# Patient Record
Sex: Female | Born: 1950 | Race: Black or African American | Hispanic: No | Marital: Married | Smoking: Former smoker
Health system: Southern US, Community
[De-identification: ages and names within clinical notes are randomized; demographics above are authoritative.]

## PROBLEM LIST (undated history)

## (undated) DIAGNOSIS — N189 Chronic kidney disease, unspecified: Secondary | ICD-10-CM

## (undated) DIAGNOSIS — K56609 Unspecified intestinal obstruction, unspecified as to partial versus complete obstruction: Secondary | ICD-10-CM

## (undated) DIAGNOSIS — F419 Anxiety disorder, unspecified: Secondary | ICD-10-CM

## (undated) DIAGNOSIS — E669 Obesity, unspecified: Secondary | ICD-10-CM

## (undated) DIAGNOSIS — L0291 Cutaneous abscess, unspecified: Secondary | ICD-10-CM

## (undated) DIAGNOSIS — F329 Major depressive disorder, single episode, unspecified: Secondary | ICD-10-CM

## (undated) DIAGNOSIS — N289 Disorder of kidney and ureter, unspecified: Secondary | ICD-10-CM

## (undated) DIAGNOSIS — K219 Gastro-esophageal reflux disease without esophagitis: Secondary | ICD-10-CM

## (undated) DIAGNOSIS — I509 Heart failure, unspecified: Secondary | ICD-10-CM

## (undated) DIAGNOSIS — I10 Essential (primary) hypertension: Secondary | ICD-10-CM

## (undated) DIAGNOSIS — K439 Ventral hernia without obstruction or gangrene: Secondary | ICD-10-CM

## (undated) DIAGNOSIS — F32A Depression, unspecified: Secondary | ICD-10-CM

## (undated) DIAGNOSIS — E785 Hyperlipidemia, unspecified: Secondary | ICD-10-CM

## (undated) HISTORY — DX: Essential (primary) hypertension: I10

## (undated) HISTORY — DX: Unspecified intestinal obstruction, unspecified as to partial versus complete obstruction: K56.609

## (undated) HISTORY — DX: Chronic kidney disease, unspecified: N18.9

## (undated) HISTORY — PX: PANNICULECTOMY: SUR1001

## (undated) HISTORY — PX: EXPLORATORY LAPAROTOMY: SUR591

## (undated) HISTORY — PX: BREAST SURGERY: SHX581

## (undated) HISTORY — DX: Heart failure, unspecified: I50.9

## (undated) HISTORY — DX: Ventral hernia without obstruction or gangrene: K43.9

## (undated) HISTORY — DX: Obesity, unspecified: E66.9

## (undated) HISTORY — DX: Disorder of kidney and ureter, unspecified: N28.9

## (undated) HISTORY — PX: INCISE AND DRAIN ABCESS: PRO64

## (undated) HISTORY — DX: Hyperlipidemia, unspecified: E78.5

## (undated) HISTORY — DX: Cutaneous abscess, unspecified: L02.91

## (undated) HISTORY — DX: Gastro-esophageal reflux disease without esophagitis: K21.9

## (undated) HISTORY — PX: OOPHORECTOMY: SHX86

## (undated) HISTORY — PX: HERNIA REPAIR: SHX51

---

## 1997-12-01 ENCOUNTER — Other Ambulatory Visit: Admission: RE | Admit: 1997-12-01 | Discharge: 1997-12-01 | Payer: Self-pay | Admitting: Obstetrics and Gynecology

## 1998-02-27 ENCOUNTER — Emergency Department (HOSPITAL_COMMUNITY): Admission: EM | Admit: 1998-02-27 | Discharge: 1998-02-27 | Payer: Self-pay | Admitting: Emergency Medicine

## 1998-04-23 ENCOUNTER — Emergency Department (HOSPITAL_COMMUNITY): Admission: EM | Admit: 1998-04-23 | Discharge: 1998-04-23 | Payer: Self-pay | Admitting: Emergency Medicine

## 1998-04-24 ENCOUNTER — Encounter: Payer: Self-pay | Admitting: Emergency Medicine

## 1998-08-07 ENCOUNTER — Other Ambulatory Visit: Admission: RE | Admit: 1998-08-07 | Discharge: 1998-08-07 | Payer: Self-pay | Admitting: Obstetrics and Gynecology

## 1998-10-05 ENCOUNTER — Encounter: Payer: Self-pay | Admitting: Emergency Medicine

## 1998-10-05 ENCOUNTER — Emergency Department (HOSPITAL_COMMUNITY): Admission: EM | Admit: 1998-10-05 | Discharge: 1998-10-05 | Payer: Self-pay | Admitting: Emergency Medicine

## 1998-11-16 ENCOUNTER — Emergency Department (HOSPITAL_COMMUNITY): Admission: EM | Admit: 1998-11-16 | Discharge: 1998-11-16 | Payer: Self-pay | Admitting: *Deleted

## 1998-11-17 ENCOUNTER — Encounter: Payer: Self-pay | Admitting: Emergency Medicine

## 2000-07-02 ENCOUNTER — Emergency Department (HOSPITAL_COMMUNITY): Admission: EM | Admit: 2000-07-02 | Discharge: 2000-07-02 | Payer: Self-pay | Admitting: Emergency Medicine

## 2000-11-03 ENCOUNTER — Other Ambulatory Visit: Admission: RE | Admit: 2000-11-03 | Discharge: 2000-11-03 | Payer: Self-pay | Admitting: Obstetrics and Gynecology

## 2001-05-28 ENCOUNTER — Emergency Department (HOSPITAL_COMMUNITY): Admission: EM | Admit: 2001-05-28 | Discharge: 2001-05-28 | Payer: Self-pay

## 2001-06-22 ENCOUNTER — Emergency Department (HOSPITAL_COMMUNITY): Admission: EM | Admit: 2001-06-22 | Discharge: 2001-06-22 | Payer: Self-pay

## 2001-06-30 ENCOUNTER — Encounter: Admission: RE | Admit: 2001-06-30 | Discharge: 2001-07-02 | Payer: Self-pay | Admitting: Orthopedic Surgery

## 2001-07-06 ENCOUNTER — Encounter: Payer: Self-pay | Admitting: Orthopedic Surgery

## 2001-07-08 ENCOUNTER — Ambulatory Visit (HOSPITAL_COMMUNITY): Admission: RE | Admit: 2001-07-08 | Discharge: 2001-07-08 | Payer: Self-pay | Admitting: Orthopedic Surgery

## 2001-07-13 ENCOUNTER — Encounter: Admission: RE | Admit: 2001-07-13 | Discharge: 2001-09-01 | Payer: Self-pay | Admitting: Orthopedic Surgery

## 2001-08-25 ENCOUNTER — Encounter: Payer: Self-pay | Admitting: Orthopedic Surgery

## 2001-08-25 ENCOUNTER — Encounter: Admission: RE | Admit: 2001-08-25 | Discharge: 2001-08-25 | Payer: Self-pay | Admitting: Orthopedic Surgery

## 2001-11-10 ENCOUNTER — Ambulatory Visit (HOSPITAL_COMMUNITY): Admission: RE | Admit: 2001-11-10 | Discharge: 2001-11-10 | Payer: Self-pay | Admitting: Orthopedic Surgery

## 2001-12-29 ENCOUNTER — Encounter: Admission: RE | Admit: 2001-12-29 | Discharge: 2002-02-01 | Payer: Self-pay | Admitting: Orthopedic Surgery

## 2002-06-18 ENCOUNTER — Emergency Department (HOSPITAL_COMMUNITY): Admission: EM | Admit: 2002-06-18 | Discharge: 2002-06-18 | Payer: Self-pay | Admitting: Emergency Medicine

## 2002-06-22 ENCOUNTER — Emergency Department (HOSPITAL_COMMUNITY): Admission: EM | Admit: 2002-06-22 | Discharge: 2002-06-22 | Payer: Self-pay | Admitting: Emergency Medicine

## 2002-08-03 ENCOUNTER — Encounter: Admission: RE | Admit: 2002-08-03 | Discharge: 2002-09-10 | Payer: Self-pay | Admitting: Surgery

## 2002-08-06 ENCOUNTER — Encounter: Payer: Self-pay | Admitting: Surgery

## 2002-08-06 ENCOUNTER — Encounter: Admission: RE | Admit: 2002-08-06 | Discharge: 2002-08-06 | Payer: Self-pay | Admitting: Surgery

## 2002-09-05 ENCOUNTER — Emergency Department (HOSPITAL_COMMUNITY): Admission: EM | Admit: 2002-09-05 | Discharge: 2002-09-05 | Payer: Self-pay | Admitting: Emergency Medicine

## 2002-10-19 ENCOUNTER — Encounter: Admission: RE | Admit: 2002-10-19 | Discharge: 2003-01-17 | Payer: Self-pay | Admitting: Surgery

## 2002-11-16 ENCOUNTER — Emergency Department (HOSPITAL_COMMUNITY): Admission: EM | Admit: 2002-11-16 | Discharge: 2002-11-17 | Payer: Self-pay | Admitting: Emergency Medicine

## 2002-11-25 ENCOUNTER — Emergency Department (HOSPITAL_COMMUNITY): Admission: EM | Admit: 2002-11-25 | Discharge: 2002-11-25 | Payer: Self-pay | Admitting: Emergency Medicine

## 2003-02-28 ENCOUNTER — Emergency Department (HOSPITAL_COMMUNITY): Admission: EM | Admit: 2003-02-28 | Discharge: 2003-02-28 | Payer: Self-pay | Admitting: Emergency Medicine

## 2003-05-14 ENCOUNTER — Emergency Department (HOSPITAL_COMMUNITY): Admission: EM | Admit: 2003-05-14 | Discharge: 2003-05-14 | Payer: Self-pay | Admitting: Emergency Medicine

## 2003-05-28 ENCOUNTER — Emergency Department (HOSPITAL_COMMUNITY): Admission: EM | Admit: 2003-05-28 | Discharge: 2003-05-28 | Payer: Self-pay | Admitting: Emergency Medicine

## 2004-02-08 ENCOUNTER — Ambulatory Visit (HOSPITAL_COMMUNITY): Admission: RE | Admit: 2004-02-08 | Discharge: 2004-02-08 | Payer: Self-pay | Admitting: Obstetrics and Gynecology

## 2004-02-08 ENCOUNTER — Encounter (INDEPENDENT_AMBULATORY_CARE_PROVIDER_SITE_OTHER): Payer: Self-pay | Admitting: *Deleted

## 2004-11-27 ENCOUNTER — Emergency Department (HOSPITAL_COMMUNITY): Admission: EM | Admit: 2004-11-27 | Discharge: 2004-11-27 | Payer: Self-pay | Admitting: Emergency Medicine

## 2005-05-29 ENCOUNTER — Emergency Department (HOSPITAL_COMMUNITY): Admission: EM | Admit: 2005-05-29 | Discharge: 2005-05-29 | Payer: Self-pay | Admitting: Emergency Medicine

## 2005-11-04 ENCOUNTER — Emergency Department (HOSPITAL_COMMUNITY): Admission: EM | Admit: 2005-11-04 | Discharge: 2005-11-04 | Payer: Self-pay | Admitting: Emergency Medicine

## 2006-01-06 ENCOUNTER — Encounter: Admission: RE | Admit: 2006-01-06 | Discharge: 2006-01-06 | Payer: Self-pay | Admitting: Orthopedic Surgery

## 2006-03-24 ENCOUNTER — Emergency Department (HOSPITAL_COMMUNITY): Admission: EM | Admit: 2006-03-24 | Discharge: 2006-03-24 | Payer: Self-pay | Admitting: Emergency Medicine

## 2006-03-27 ENCOUNTER — Emergency Department (HOSPITAL_COMMUNITY): Admission: EM | Admit: 2006-03-27 | Discharge: 2006-03-28 | Payer: Self-pay | Admitting: Emergency Medicine

## 2006-07-05 ENCOUNTER — Emergency Department (HOSPITAL_COMMUNITY): Admission: EM | Admit: 2006-07-05 | Discharge: 2006-07-05 | Payer: Self-pay | Admitting: Emergency Medicine

## 2006-07-25 ENCOUNTER — Encounter: Admission: RE | Admit: 2006-07-25 | Discharge: 2006-07-25 | Payer: Self-pay | Admitting: Orthopedic Surgery

## 2006-08-02 ENCOUNTER — Emergency Department (HOSPITAL_COMMUNITY): Admission: EM | Admit: 2006-08-02 | Discharge: 2006-08-03 | Payer: Self-pay | Admitting: Emergency Medicine

## 2006-09-03 ENCOUNTER — Encounter: Admission: RE | Admit: 2006-09-03 | Discharge: 2006-09-03 | Payer: Self-pay | Admitting: Orthopedic Surgery

## 2006-09-10 ENCOUNTER — Emergency Department (HOSPITAL_COMMUNITY): Admission: EM | Admit: 2006-09-10 | Discharge: 2006-09-10 | Payer: Self-pay | Admitting: Emergency Medicine

## 2006-10-14 ENCOUNTER — Emergency Department (HOSPITAL_COMMUNITY): Admission: EM | Admit: 2006-10-14 | Discharge: 2006-10-14 | Payer: Self-pay | Admitting: Emergency Medicine

## 2006-11-13 ENCOUNTER — Emergency Department (HOSPITAL_COMMUNITY): Admission: EM | Admit: 2006-11-13 | Discharge: 2006-11-13 | Payer: Self-pay | Admitting: Emergency Medicine

## 2006-11-28 ENCOUNTER — Ambulatory Visit: Payer: Self-pay | Admitting: Oncology

## 2006-11-28 ENCOUNTER — Inpatient Hospital Stay (HOSPITAL_COMMUNITY): Admission: EM | Admit: 2006-11-28 | Discharge: 2006-12-08 | Payer: Self-pay | Admitting: Emergency Medicine

## 2006-12-09 ENCOUNTER — Ambulatory Visit: Payer: Self-pay | Admitting: Oncology

## 2006-12-12 LAB — CBC WITH DIFFERENTIAL/PLATELET
BASO%: 0.1 % (ref 0.0–2.0)
EOS%: 1 % (ref 0.0–7.0)
HCT: 27.8 % — ABNORMAL LOW (ref 34.8–46.6)
LYMPH%: 19.1 % (ref 14.0–48.0)
MCH: 28.6 pg (ref 26.0–34.0)
MCHC: 34 g/dL (ref 32.0–36.0)
NEUT%: 69.5 % (ref 39.6–76.8)
RBC: 3.3 10*6/uL — ABNORMAL LOW (ref 3.70–5.32)
WBC: 7.2 10*3/uL (ref 3.9–10.0)
lymph#: 1.4 10*3/uL (ref 0.9–3.3)

## 2006-12-16 LAB — SPEP & IFE WITH QIG
Beta 2: 4.1 % (ref 3.2–6.5)
Beta Globulin: 4.8 % (ref 4.7–7.2)
IgA: 44 mg/dL — ABNORMAL LOW (ref 68–378)
IgG (Immunoglobin G), Serum: 597 mg/dL — ABNORMAL LOW (ref 694–1618)
IgM, Serum: 20 mg/dL — ABNORMAL LOW (ref 60–263)
Total Protein, Serum Electrophoresis: 6.6 g/dL (ref 6.0–8.3)

## 2006-12-16 LAB — COMPREHENSIVE METABOLIC PANEL
ALT: 15 U/L (ref 0–35)
AST: 13 U/L (ref 0–37)
Chloride: 111 mEq/L (ref 96–112)
Creatinine, Ser: 3.16 mg/dL — ABNORMAL HIGH (ref 0.40–1.20)
Sodium: 141 mEq/L (ref 135–145)
Total Bilirubin: 0.4 mg/dL (ref 0.3–1.2)
Total Protein: 6.6 g/dL (ref 6.0–8.3)

## 2006-12-16 LAB — IRON AND TIBC
TIBC: 240 ug/dL — ABNORMAL LOW (ref 250–470)
UIBC: 184 ug/dL

## 2006-12-16 LAB — FERRITIN: Ferritin: 1003 ng/mL — ABNORMAL HIGH (ref 10–291)

## 2006-12-16 LAB — KAPPA/LAMBDA LIGHT CHAINS

## 2006-12-17 ENCOUNTER — Ambulatory Visit: Payer: Self-pay | Admitting: Family Medicine

## 2007-01-16 LAB — CBC WITH DIFFERENTIAL/PLATELET
BASO%: 0.1 % (ref 0.0–2.0)
EOS%: 0 % (ref 0.0–7.0)
MCH: 29.2 pg (ref 26.0–34.0)
MCV: 86.3 fL (ref 81.0–101.0)
MONO%: 6.4 % (ref 0.0–13.0)
RBC: 3.09 10*6/uL — ABNORMAL LOW (ref 3.70–5.32)
RDW: 20.3 % — ABNORMAL HIGH (ref 11.3–14.5)

## 2007-01-20 LAB — COMPREHENSIVE METABOLIC PANEL
ALT: 29 U/L (ref 0–35)
AST: 24 U/L (ref 0–37)
Albumin: 4 g/dL (ref 3.5–5.2)
Alkaline Phosphatase: 126 U/L — ABNORMAL HIGH (ref 39–117)
Potassium: 3.1 mEq/L — ABNORMAL LOW (ref 3.5–5.3)
Sodium: 144 mEq/L (ref 135–145)
Total Protein: 6.2 g/dL (ref 6.0–8.3)

## 2007-01-20 LAB — SPEP & IFE WITH QIG
Alpha-2-Globulin: 14.1 % — ABNORMAL HIGH (ref 7.1–11.8)
Beta Globulin: 5.5 % (ref 4.7–7.2)
Gamma Globulin: 7.6 % — ABNORMAL LOW (ref 11.1–18.8)
IgG (Immunoglobin G), Serum: 473 mg/dL — ABNORMAL LOW (ref 694–1618)
M-Spike, %: 0.28 g/dL

## 2007-01-20 LAB — KAPPA/LAMBDA LIGHT CHAINS

## 2007-01-24 ENCOUNTER — Encounter: Admission: RE | Admit: 2007-01-24 | Discharge: 2007-01-24 | Payer: Self-pay | Admitting: Orthopedic Surgery

## 2007-01-28 ENCOUNTER — Ambulatory Visit: Payer: Self-pay | Admitting: Oncology

## 2007-01-30 LAB — CBC WITH DIFFERENTIAL/PLATELET
BASO%: 1.4 % (ref 0.0–2.0)
LYMPH%: 13.6 % — ABNORMAL LOW (ref 14.0–48.0)
MCH: 28.1 pg (ref 26.0–34.0)
MCHC: 31.4 g/dL — ABNORMAL LOW (ref 32.0–36.0)
MCV: 89.4 fL (ref 81.0–101.0)
MONO%: 7.5 % (ref 0.0–13.0)
Platelets: 209 10*3/uL (ref 145–400)
RBC: 3.3 10*6/uL — ABNORMAL LOW (ref 3.70–5.32)

## 2007-03-18 ENCOUNTER — Ambulatory Visit: Payer: Self-pay | Admitting: Oncology

## 2007-03-27 LAB — CBC WITH DIFFERENTIAL/PLATELET
BASO%: 0.4 % (ref 0.0–2.0)
LYMPH%: 27.7 % (ref 14.0–48.0)
MCHC: 33.4 g/dL (ref 32.0–36.0)
MONO#: 0.3 10*3/uL (ref 0.1–0.9)
Platelets: 268 10*3/uL (ref 145–400)
RBC: 2.92 10*6/uL — ABNORMAL LOW (ref 3.70–5.32)
WBC: 3.8 10*3/uL — ABNORMAL LOW (ref 3.9–10.0)
lymph#: 1 10*3/uL (ref 0.9–3.3)

## 2007-04-01 LAB — SPEP & IFE WITH QIG
Albumin ELP: 59.2 % (ref 55.8–66.1)
Beta Globulin: 4 % — ABNORMAL LOW (ref 4.7–7.2)
IgA: 45 mg/dL — ABNORMAL LOW (ref 68–378)
IgM, Serum: 31 mg/dL — ABNORMAL LOW (ref 60–263)
Total Protein, Serum Electrophoresis: 6.3 g/dL (ref 6.0–8.3)

## 2007-04-01 LAB — COMPREHENSIVE METABOLIC PANEL
Alkaline Phosphatase: 132 U/L — ABNORMAL HIGH (ref 39–117)
Creatinine, Ser: 1.24 mg/dL — ABNORMAL HIGH (ref 0.40–1.20)
Glucose, Bld: 108 mg/dL — ABNORMAL HIGH (ref 70–99)
Sodium: 147 mEq/L — ABNORMAL HIGH (ref 135–145)
Total Bilirubin: 0.5 mg/dL (ref 0.3–1.2)
Total Protein: 6.3 g/dL (ref 6.0–8.3)

## 2007-04-01 LAB — KAPPA/LAMBDA LIGHT CHAINS: Kappa free light chain: 348 mg/dL — ABNORMAL HIGH (ref 0.33–1.94)

## 2007-05-08 ENCOUNTER — Ambulatory Visit: Payer: Self-pay | Admitting: Oncology

## 2007-05-13 LAB — CBC WITH DIFFERENTIAL/PLATELET
BASO%: 1.1 % (ref 0.0–2.0)
Eosinophils Absolute: 0.1 10*3/uL (ref 0.0–0.5)
HCT: 31.3 % — ABNORMAL LOW (ref 34.8–46.6)
HGB: 10.5 g/dL — ABNORMAL LOW (ref 11.6–15.9)
MCHC: 33.4 g/dL (ref 32.0–36.0)
MONO#: 0.3 10*3/uL (ref 0.1–0.9)
NEUT#: 1.3 10*3/uL — ABNORMAL LOW (ref 1.5–6.5)
NEUT%: 46.8 % (ref 39.6–76.8)
WBC: 2.9 10*3/uL — ABNORMAL LOW (ref 3.9–10.0)
lymph#: 1.1 10*3/uL (ref 0.9–3.3)

## 2007-05-15 LAB — COMPREHENSIVE METABOLIC PANEL
ALT: 9 U/L (ref 0–35)
CO2: 24 mEq/L (ref 19–32)
Calcium: 9.2 mg/dL (ref 8.4–10.5)
Chloride: 107 mEq/L (ref 96–112)
Creatinine, Ser: 0.9 mg/dL (ref 0.40–1.20)
Glucose, Bld: 73 mg/dL (ref 70–99)
Total Protein: 6.4 g/dL (ref 6.0–8.3)

## 2007-05-15 LAB — IMMUNOFIXATION ELECTROPHORESIS
IgG (Immunoglobin G), Serum: 648 mg/dL — ABNORMAL LOW (ref 694–1618)
Total Protein, Serum Electrophoresis: 6.4 g/dL (ref 6.0–8.3)

## 2007-05-15 LAB — KAPPA/LAMBDA LIGHT CHAINS

## 2007-06-03 LAB — CBC WITH DIFFERENTIAL/PLATELET
Basophils Absolute: 0 10*3/uL (ref 0.0–0.1)
Eosinophils Absolute: 0.1 10*3/uL (ref 0.0–0.5)
HCT: 31.4 % — ABNORMAL LOW (ref 34.8–46.6)
HGB: 10.6 g/dL — ABNORMAL LOW (ref 11.6–15.9)
MONO#: 0.4 10*3/uL (ref 0.1–0.9)
NEUT%: 50 % (ref 39.6–76.8)
WBC: 3.3 10*3/uL — ABNORMAL LOW (ref 3.9–10.0)
lymph#: 1.1 10*3/uL (ref 0.9–3.3)

## 2007-07-09 ENCOUNTER — Ambulatory Visit: Payer: Self-pay | Admitting: Oncology

## 2007-07-13 LAB — CBC WITH DIFFERENTIAL/PLATELET
Basophils Absolute: 0 10*3/uL (ref 0.0–0.1)
EOS%: 0.7 % (ref 0.0–7.0)
HCT: 34.8 % (ref 34.8–46.6)
HGB: 11.3 g/dL — ABNORMAL LOW (ref 11.6–15.9)
MCH: 27.2 pg (ref 26.0–34.0)
MCV: 83.6 fL (ref 81.0–101.0)
MONO%: 9.7 % (ref 0.0–13.0)
NEUT%: 52.8 % (ref 39.6–76.8)

## 2007-07-15 LAB — KAPPA/LAMBDA LIGHT CHAINS: Lambda Free Lght Chn: <0.8 mg/dL (ref 0.57–2.63)

## 2007-07-15 LAB — SPEP & IFE WITH QIG
Alpha-2-Globulin: 13 % — ABNORMAL HIGH (ref 7.1–11.8)
Beta 2: 4.7 % (ref 3.2–6.5)
Gamma Globulin: 9.8 % — ABNORMAL LOW (ref 11.1–18.8)
IgG (Immunoglobin G), Serum: 614 mg/dL — ABNORMAL LOW (ref 694–1618)
M-Spike, %: 0.37 g/dL

## 2007-07-15 LAB — COMPREHENSIVE METABOLIC PANEL
AST: 19 U/L (ref 0–37)
Alkaline Phosphatase: 132 U/L — ABNORMAL HIGH (ref 39–117)
BUN: 11 mg/dL (ref 6–23)
Calcium: 9.3 mg/dL (ref 8.4–10.5)
Chloride: 108 mEq/L (ref 96–112)
Creatinine, Ser: 0.81 mg/dL (ref 0.40–1.20)
Glucose, Bld: 77 mg/dL (ref 70–99)

## 2007-08-14 LAB — CBC WITH DIFFERENTIAL/PLATELET
BASO%: 0.2 % (ref 0.0–2.0)
Basophils Absolute: 0 10*3/uL (ref 0.0–0.1)
EOS%: 0.9 % (ref 0.0–7.0)
HCT: 37.1 % (ref 34.8–46.6)
HGB: 12.6 g/dL (ref 11.6–15.9)
LYMPH%: 38.8 % (ref 14.0–48.0)
MCH: 28.5 pg (ref 26.0–34.0)
MCHC: 33.9 g/dL (ref 32.0–36.0)
MONO#: 0.2 10*3/uL (ref 0.1–0.9)
NEUT%: 52.7 % (ref 39.6–76.8)
Platelets: 192 10*3/uL (ref 145–400)
lymph#: 1.2 10*3/uL (ref 0.9–3.3)

## 2007-08-18 LAB — SPEP & IFE WITH QIG
Alpha-1-Globulin: 5.8 % — ABNORMAL HIGH (ref 2.9–4.9)
Beta 2: 4.6 % (ref 3.2–6.5)
Gamma Globulin: 9.5 % — ABNORMAL LOW (ref 11.1–18.8)
IgA: 32 mg/dL — ABNORMAL LOW (ref 68–378)
IgG (Immunoglobin G), Serum: 698 mg/dL (ref 694–1618)
IgM, Serum: 20 mg/dL — ABNORMAL LOW (ref 60–263)
M-Spike, %: 0.41 g/dL

## 2007-08-18 LAB — COMPREHENSIVE METABOLIC PANEL
BUN: 13 mg/dL (ref 6–23)
CO2: 28 mEq/L (ref 19–32)
Calcium: 9.6 mg/dL (ref 8.4–10.5)
Chloride: 108 mEq/L (ref 96–112)
Creatinine, Ser: 0.95 mg/dL (ref 0.40–1.20)
Total Bilirubin: 0.6 mg/dL (ref 0.3–1.2)

## 2007-08-18 LAB — KAPPA/LAMBDA LIGHT CHAINS: Kappa free light chain: 192 mg/dL — ABNORMAL HIGH (ref 0.33–1.94)

## 2007-09-11 ENCOUNTER — Ambulatory Visit: Payer: Self-pay | Admitting: Oncology

## 2007-09-15 LAB — CBC WITH DIFFERENTIAL/PLATELET
Basophils Absolute: 0 10*3/uL (ref 0.0–0.1)
EOS%: 1.1 % (ref 0.0–7.0)
HCT: 35.9 % (ref 34.8–46.6)
HGB: 12.3 g/dL (ref 11.6–15.9)
MCH: 28.9 pg (ref 26.0–34.0)
MONO#: 0.1 10*3/uL (ref 0.1–0.9)
NEUT%: 65.4 % (ref 39.6–76.8)
Platelets: 174 10*3/uL (ref 145–400)
lymph#: 1.4 10*3/uL (ref 0.9–3.3)

## 2007-09-17 LAB — COMPREHENSIVE METABOLIC PANEL
BUN: 15 mg/dL (ref 6–23)
CO2: 26 mEq/L (ref 19–32)
Calcium: 9.3 mg/dL (ref 8.4–10.5)
Chloride: 108 mEq/L (ref 96–112)
Creatinine, Ser: 0.85 mg/dL (ref 0.40–1.20)

## 2007-09-17 LAB — KAPPA/LAMBDA LIGHT CHAINS

## 2007-09-17 LAB — IMMUNOFIXATION ELECTROPHORESIS
IgG (Immunoglobin G), Serum: 664 mg/dL — ABNORMAL LOW (ref 694–1618)
IgM, Serum: 17 mg/dL — ABNORMAL LOW (ref 60–263)
Total Protein, Serum Electrophoresis: 6.9 g/dL (ref 6.0–8.3)

## 2007-10-20 ENCOUNTER — Ambulatory Visit (HOSPITAL_COMMUNITY): Admission: RE | Admit: 2007-10-20 | Discharge: 2007-10-20 | Payer: Self-pay | Admitting: Oncology

## 2007-10-30 ENCOUNTER — Ambulatory Visit: Payer: Self-pay | Admitting: Oncology

## 2007-11-01 ENCOUNTER — Emergency Department (HOSPITAL_COMMUNITY): Admission: EM | Admit: 2007-11-01 | Discharge: 2007-11-01 | Payer: Self-pay | Admitting: Emergency Medicine

## 2007-11-03 LAB — CBC WITH DIFFERENTIAL/PLATELET
Basophils Absolute: 0 10*3/uL (ref 0.0–0.1)
Eosinophils Absolute: 0.1 10*3/uL (ref 0.0–0.5)
HGB: 11.8 g/dL (ref 11.6–15.9)
MONO#: 0.4 10*3/uL (ref 0.1–0.9)
NEUT#: 1.5 10*3/uL (ref 1.5–6.5)
RBC: 4.08 10*6/uL (ref 3.70–5.32)
RDW: 17.1 % — ABNORMAL HIGH (ref 11.3–14.5)
WBC: 3.1 10*3/uL — ABNORMAL LOW (ref 3.9–10.0)
lymph#: 1.2 10*3/uL (ref 0.9–3.3)

## 2007-11-05 LAB — COMPREHENSIVE METABOLIC PANEL
Albumin: 4.2 g/dL (ref 3.5–5.2)
Alkaline Phosphatase: 126 U/L — ABNORMAL HIGH (ref 39–117)
BUN: 16 mg/dL (ref 6–23)
CO2: 28 mEq/L (ref 19–32)
Calcium: 9.2 mg/dL (ref 8.4–10.5)
Chloride: 109 mEq/L (ref 96–112)
Glucose, Bld: 99 mg/dL (ref 70–99)
Potassium: 3.5 mEq/L (ref 3.5–5.3)
Sodium: 147 mEq/L — ABNORMAL HIGH (ref 135–145)
Total Protein: 6.5 g/dL (ref 6.0–8.3)

## 2007-11-05 LAB — SPEP & IFE WITH QIG
Albumin ELP: 61.2 % (ref 55.8–66.1)
Beta 2: 4.4 % (ref 3.2–6.5)
Beta Globulin: 5 % (ref 4.7–7.2)
Gamma Globulin: 10.3 % — ABNORMAL LOW (ref 11.1–18.8)
IgA: 27 mg/dL — ABNORMAL LOW (ref 68–378)
IgG (Immunoglobin G), Serum: 732 mg/dL (ref 694–1618)

## 2007-11-05 LAB — KAPPA/LAMBDA LIGHT CHAINS

## 2007-11-30 ENCOUNTER — Ambulatory Visit: Payer: Self-pay | Admitting: Internal Medicine

## 2007-11-30 ENCOUNTER — Encounter (INDEPENDENT_AMBULATORY_CARE_PROVIDER_SITE_OTHER): Payer: Self-pay | Admitting: Nurse Practitioner

## 2007-11-30 LAB — CONVERTED CEMR LAB
Albumin: 4.3 g/dL (ref 3.5–5.2)
BUN: 17 mg/dL (ref 6–23)
CO2: 28 meq/L (ref 19–32)
Cholesterol: 160 mg/dL (ref 0–200)
Glucose, Bld: 89 mg/dL (ref 70–99)
HDL: 52 mg/dL (ref >39)
Potassium: 4 meq/L (ref 3.5–5.3)
Sodium: 147 meq/L — ABNORMAL HIGH (ref 135–145)
Total Protein: 6.8 g/dL (ref 6.0–8.3)
Triglycerides: 131 mg/dL (ref <150)

## 2007-12-16 ENCOUNTER — Ambulatory Visit: Payer: Self-pay | Admitting: Oncology

## 2007-12-18 LAB — CBC WITH DIFFERENTIAL/PLATELET
Eosinophils Absolute: 0 10*3/uL (ref 0.0–0.5)
LYMPH%: 28.6 % (ref 14.0–48.0)
MONO#: 0.4 10*3/uL (ref 0.1–0.9)
NEUT#: 2.6 10*3/uL (ref 1.5–6.5)
Platelets: 176 10*3/uL (ref 145–400)
RBC: 4.2 10*6/uL (ref 3.70–5.32)
WBC: 4.2 10*3/uL (ref 3.9–10.0)
lymph#: 1.2 10*3/uL (ref 0.9–3.3)

## 2007-12-22 LAB — SPEP & IFE WITH QIG
Albumin ELP: 60.3 % (ref 55.8–66.1)
Alpha-1-Globulin: 6.3 % — ABNORMAL HIGH (ref 2.9–4.9)
Alpha-2-Globulin: 13.3 % — ABNORMAL HIGH (ref 7.1–11.8)
Beta 2: 4.4 % (ref 3.2–6.5)
Beta Globulin: 5.1 % (ref 4.7–7.2)
Total Protein, Serum Electrophoresis: 6.7 g/dL (ref 6.0–8.3)

## 2007-12-22 LAB — KAPPA/LAMBDA LIGHT CHAINS
Kappa free light chain: 329 mg/dL — ABNORMAL HIGH (ref 0.33–1.94)
Lambda Free Lght Chn: 0.91 mg/dL (ref 0.57–2.63)

## 2007-12-22 LAB — COMPREHENSIVE METABOLIC PANEL
Albumin: 4.3 g/dL (ref 3.5–5.2)
CO2: 25 mEq/L (ref 19–32)
Calcium: 9.3 mg/dL (ref 8.4–10.5)
Chloride: 109 mEq/L (ref 96–112)
Glucose, Bld: 82 mg/dL (ref 70–99)
Potassium: 3.6 mEq/L (ref 3.5–5.3)
Sodium: 145 mEq/L (ref 135–145)
Total Bilirubin: 0.6 mg/dL (ref 0.3–1.2)
Total Protein: 6.7 g/dL (ref 6.0–8.3)

## 2008-01-19 LAB — CBC WITH DIFFERENTIAL/PLATELET
BASO%: 1 % (ref 0.0–2.0)
MCHC: 32.1 g/dL (ref 32.0–36.0)
MONO#: 0.5 10*3/uL (ref 0.1–0.9)
RBC: 4.28 10*6/uL (ref 3.70–5.32)
WBC: 4.4 10*3/uL (ref 3.9–10.0)
lymph#: 1.7 10*3/uL (ref 0.9–3.3)

## 2008-01-21 LAB — COMPREHENSIVE METABOLIC PANEL
ALT: 23 U/L (ref 0–35)
CO2: 29 mEq/L (ref 19–32)
Chloride: 104 mEq/L (ref 96–112)
Sodium: 143 mEq/L (ref 135–145)
Total Bilirubin: 0.4 mg/dL (ref 0.3–1.2)
Total Protein: 7 g/dL (ref 6.0–8.3)

## 2008-01-21 LAB — KAPPA/LAMBDA LIGHT CHAINS: Kappa free light chain: 351 mg/dL — ABNORMAL HIGH (ref 0.33–1.94)

## 2008-01-21 LAB — SPEP & IFE WITH QIG
Alpha-1-Globulin: 5.6 % — ABNORMAL HIGH (ref 2.9–4.9)
Alpha-2-Globulin: 12.9 % — ABNORMAL HIGH (ref 7.1–11.8)
IgG (Immunoglobin G), Serum: 908 mg/dL (ref 694–1618)
M-Spike, %: 0.08 g/dL
Total Protein, Serum Electrophoresis: 7 g/dL (ref 6.0–8.3)

## 2008-02-15 ENCOUNTER — Ambulatory Visit: Payer: Self-pay | Admitting: Oncology

## 2008-02-16 LAB — CBC WITH DIFFERENTIAL/PLATELET
Eosinophils Absolute: 0 10*3/uL (ref 0.0–0.5)
HCT: 34.6 % — ABNORMAL LOW (ref 34.8–46.6)
LYMPH%: 36.4 % (ref 14.0–48.0)
MONO#: 0.4 10*3/uL (ref 0.1–0.9)
NEUT#: 2 10*3/uL (ref 1.5–6.5)
NEUT%: 51.2 % (ref 39.6–76.8)
Platelets: 180 10*3/uL (ref 145–400)
RBC: 4.01 10*6/uL (ref 3.70–5.32)
WBC: 3.9 10*3/uL (ref 3.9–10.0)

## 2008-02-18 LAB — SPEP & IFE WITH QIG
Gamma Globulin: 13 % (ref 11.1–18.8)
IgG (Immunoglobin G), Serum: 1010 mg/dL (ref 694–1618)
IgM, Serum: 17 mg/dL — ABNORMAL LOW (ref 60–263)
M-Spike, %: 0.7 g/dL

## 2008-02-18 LAB — COMPREHENSIVE METABOLIC PANEL
AST: 26 U/L (ref 0–37)
Albumin: 4.4 g/dL (ref 3.5–5.2)
Alkaline Phosphatase: 114 U/L (ref 39–117)
BUN: 18 mg/dL (ref 6–23)
Creatinine, Ser: 1.05 mg/dL (ref 0.40–1.20)
Potassium: 4.1 mEq/L (ref 3.5–5.3)

## 2008-02-18 LAB — KAPPA/LAMBDA LIGHT CHAINS

## 2008-03-15 LAB — CBC WITH DIFFERENTIAL/PLATELET
BASO%: 1.8 % (ref 0.0–2.0)
EOS%: 0.9 % (ref 0.0–7.0)
HGB: 11.6 g/dL (ref 11.6–15.9)
MCH: 27.8 pg (ref 26.0–34.0)
MCHC: 32.2 g/dL (ref 32.0–36.0)
RDW: 19.6 % — ABNORMAL HIGH (ref 11.3–14.5)
lymph#: 1.8 10*3/uL (ref 0.9–3.3)

## 2008-03-17 LAB — COMPREHENSIVE METABOLIC PANEL
ALT: 21 U/L (ref 0–35)
AST: 35 U/L (ref 0–37)
Albumin: 4.3 g/dL (ref 3.5–5.2)
Calcium: 8.9 mg/dL (ref 8.4–10.5)
Chloride: 107 mEq/L (ref 96–112)
Potassium: 4.6 mEq/L (ref 3.5–5.3)

## 2008-03-22 ENCOUNTER — Ambulatory Visit: Payer: Self-pay | Admitting: Internal Medicine

## 2008-03-22 ENCOUNTER — Encounter (INDEPENDENT_AMBULATORY_CARE_PROVIDER_SITE_OTHER): Payer: Self-pay | Admitting: Family Medicine

## 2008-03-22 LAB — CONVERTED CEMR LAB
ALT: 21 units/L (ref 0–35)
Albumin: 4.4 g/dL (ref 3.5–5.2)
Basophils Absolute: 0 10*3/uL (ref 0.0–0.1)
CO2: 24 meq/L (ref 19–32)
Cholesterol: 160 mg/dL (ref 0–200)
Eosinophils Relative: 1 % (ref 0–5)
Glucose, Bld: 90 mg/dL (ref 70–99)
HCT: 36.1 % (ref 36.0–46.0)
Hemoglobin: 11.6 g/dL — ABNORMAL LOW (ref 12.0–15.0)
LDL Cholesterol: 88 mg/dL (ref 0–99)
MCHC: 32.1 g/dL (ref 30.0–36.0)
MCV: 85.7 fL (ref 78.0–100.0)
Microalb, Ur: 8.8 mg/dL — ABNORMAL HIGH (ref 0.00–1.89)
Monocytes Absolute: 0.4 10*3/uL (ref 0.1–1.0)
Monocytes Relative: 10 % (ref 3–12)
Potassium: 3.6 meq/L (ref 3.5–5.3)
RBC: 4.21 M/uL (ref 3.87–5.11)
Sodium: 144 meq/L (ref 135–145)
Total Protein: 7.1 g/dL (ref 6.0–8.3)
Triglycerides: 90 mg/dL (ref <150)
VLDL: 18 mg/dL (ref 0–40)

## 2008-04-08 ENCOUNTER — Ambulatory Visit: Payer: Self-pay | Admitting: Oncology

## 2008-04-08 ENCOUNTER — Ambulatory Visit (HOSPITAL_COMMUNITY): Admission: RE | Admit: 2008-04-08 | Discharge: 2008-04-08 | Payer: Self-pay | Admitting: Oncology

## 2008-04-25 LAB — COMPREHENSIVE METABOLIC PANEL
ALT: 28 U/L (ref 0–35)
AST: 26 U/L (ref 0–37)
Alkaline Phosphatase: 98 U/L (ref 39–117)
BUN: 12 mg/dL (ref 6–23)
Calcium: 9.4 mg/dL (ref 8.4–10.5)
Chloride: 107 mEq/L (ref 96–112)
Creatinine, Ser: 0.9 mg/dL (ref 0.40–1.20)
Potassium: 3.6 mEq/L (ref 3.5–5.3)

## 2008-04-25 LAB — CBC WITH DIFFERENTIAL/PLATELET
BASO%: 1.1 % (ref 0.0–2.0)
Basophils Absolute: 0 10e3/uL (ref 0.0–0.1)
EOS%: 0.7 % (ref 0.0–7.0)
Eosinophils Absolute: 0 10e3/uL (ref 0.0–0.5)
HCT: 34.4 % — ABNORMAL LOW (ref 34.8–46.6)
HGB: 11.4 g/dL — ABNORMAL LOW (ref 11.6–15.9)
LYMPH%: 47.7 % (ref 14.0–48.0)
MCH: 28.8 pg (ref 26.0–34.0)
MCHC: 33 g/dL (ref 32.0–36.0)
MCV: 87 fL (ref 81.0–101.0)
MONO#: 0.3 10e3/uL (ref 0.1–0.9)
MONO%: 10.4 % (ref 0.0–13.0)
NEUT#: 1.2 10e3/uL — ABNORMAL LOW (ref 1.5–6.5)
NEUT%: 40.1 % (ref 39.6–76.8)
Platelets: 191 10e3/uL (ref 145–400)
RBC: 3.96 10e6/uL (ref 3.70–5.32)
RDW: 20.1 % — ABNORMAL HIGH (ref 11.3–14.5)
WBC: 2.9 10e3/uL — ABNORMAL LOW (ref 3.9–10.0)
lymph#: 1.4 10e3/uL (ref 0.9–3.3)

## 2008-04-29 LAB — SPEP & IFE WITH QIG
Alpha-2-Globulin: 13 % — ABNORMAL HIGH (ref 7.1–11.8)
Gamma Globulin: 8.7 % — ABNORMAL LOW (ref 11.1–18.8)
IgG (Immunoglobin G), Serum: 559 mg/dL — ABNORMAL LOW (ref 694–1618)
IgM, Serum: 25 mg/dL — ABNORMAL LOW (ref 60–263)
M-Spike, %: 0.36 g/dL
Total Protein, Serum Electrophoresis: 6.4 g/dL (ref 6.0–8.3)

## 2008-04-29 LAB — KAPPA/LAMBDA LIGHT CHAINS

## 2008-05-24 ENCOUNTER — Ambulatory Visit: Payer: Self-pay | Admitting: Oncology

## 2008-05-26 LAB — CBC WITH DIFFERENTIAL/PLATELET
Basophils Absolute: 0.1 10*3/uL (ref 0.0–0.1)
EOS%: 1.7 % (ref 0.0–7.0)
HCT: 36.2 % (ref 34.8–46.6)
HGB: 11.5 g/dL — ABNORMAL LOW (ref 11.6–15.9)
LYMPH%: 52.3 % — ABNORMAL HIGH (ref 14.0–48.0)
MCH: 27.5 pg (ref 26.0–34.0)
MCV: 86.4 fL (ref 81.0–101.0)
MONO%: 8 % (ref 0.0–13.0)
NEUT%: 35.7 % — ABNORMAL LOW (ref 39.6–76.8)
Platelets: 202 10*3/uL (ref 145–400)

## 2008-06-01 LAB — COMPREHENSIVE METABOLIC PANEL
AST: 23 U/L (ref 0–37)
Alkaline Phosphatase: 104 U/L (ref 39–117)
BUN: 18 mg/dL (ref 6–23)
Calcium: 9 mg/dL (ref 8.4–10.5)
Chloride: 111 mEq/L (ref 96–112)
Creatinine, Ser: 0.96 mg/dL (ref 0.40–1.20)
Total Bilirubin: 0.5 mg/dL (ref 0.3–1.2)

## 2008-06-01 LAB — SPEP & IFE WITH QIG
Beta 2: 4.6 % (ref 3.2–6.5)
Beta Globulin: 5.1 % (ref 4.7–7.2)
Gamma Globulin: 9 % — ABNORMAL LOW (ref 11.1–18.8)
IgA: 46 mg/dL — ABNORMAL LOW (ref 68–378)
IgG (Immunoglobin G), Serum: 770 mg/dL (ref 694–1618)
IgM, Serum: 37 mg/dL — ABNORMAL LOW (ref 60–263)
M-Spike, %: 0.34 g/dL

## 2008-06-01 LAB — KAPPA/LAMBDA LIGHT CHAINS: Lambda Free Lght Chn: 0.72 mg/dL (ref 0.57–2.63)

## 2008-06-27 LAB — CBC WITH DIFFERENTIAL/PLATELET
BASO%: 2.1 % — ABNORMAL HIGH (ref 0.0–2.0)
Basophils Absolute: 0.1 10*3/uL (ref 0.0–0.1)
HCT: 37.1 % (ref 34.8–46.6)
HGB: 12.1 g/dL (ref 11.6–15.9)
LYMPH%: 37.2 % (ref 14.0–48.0)
MCH: 28.3 pg (ref 26.0–34.0)
MCHC: 32.6 g/dL (ref 32.0–36.0)
MONO#: 0.5 10*3/uL (ref 0.1–0.9)
NEUT%: 44.9 % (ref 39.6–76.8)
Platelets: 209 10*3/uL (ref 145–400)
WBC: 3.5 10*3/uL — ABNORMAL LOW (ref 3.9–10.0)
lymph#: 1.3 10*3/uL (ref 0.9–3.3)

## 2008-06-29 LAB — COMPREHENSIVE METABOLIC PANEL
BUN: 13 mg/dL (ref 6–23)
CO2: 27 mEq/L (ref 19–32)
Calcium: 8.9 mg/dL (ref 8.4–10.5)
Chloride: 106 mEq/L (ref 96–112)
Creatinine, Ser: 0.94 mg/dL (ref 0.40–1.20)
Glucose, Bld: 103 mg/dL — ABNORMAL HIGH (ref 70–99)
Total Bilirubin: 0.6 mg/dL (ref 0.3–1.2)

## 2008-06-29 LAB — SPEP & IFE WITH QIG
Alpha-1-Globulin: 6.7 % — ABNORMAL HIGH (ref 2.9–4.9)
Beta 2: 4.2 % (ref 3.2–6.5)
Gamma Globulin: 9.5 % — ABNORMAL LOW (ref 11.1–18.8)
IgA: 41 mg/dL — ABNORMAL LOW (ref 68–378)
IgG (Immunoglobin G), Serum: 658 mg/dL — ABNORMAL LOW (ref 694–1618)
IgM, Serum: 29 mg/dL — ABNORMAL LOW (ref 60–263)
M-Spike, %: 0.37 g/dL

## 2008-06-29 LAB — KAPPA/LAMBDA LIGHT CHAINS: Lambda Free Lght Chn: 0.79 mg/dL (ref 0.57–2.63)

## 2008-07-19 ENCOUNTER — Ambulatory Visit: Payer: Self-pay | Admitting: Oncology

## 2008-07-21 LAB — TECHNOLOGIST REVIEW

## 2008-07-21 LAB — CBC WITH DIFFERENTIAL/PLATELET
Basophils Absolute: 0 10*3/uL (ref 0.0–0.1)
Eosinophils Absolute: 0.1 10*3/uL (ref 0.0–0.5)
HGB: 12.4 g/dL (ref 11.6–15.9)
MONO#: 0.2 10*3/uL (ref 0.1–0.9)
NEUT#: 1.4 10*3/uL — ABNORMAL LOW (ref 1.5–6.5)
RBC: 4.44 10*6/uL (ref 3.70–5.32)
RDW: 19.6 % — ABNORMAL HIGH (ref 11.3–14.5)
WBC: 3 10*3/uL — ABNORMAL LOW (ref 3.9–10.0)

## 2008-07-22 LAB — COMPREHENSIVE METABOLIC PANEL
ALT: 43 U/L — ABNORMAL HIGH (ref 0–35)
AST: 38 U/L — ABNORMAL HIGH (ref 0–37)
Chloride: 102 mEq/L (ref 96–112)
Creatinine, Ser: 0.98 mg/dL (ref 0.40–1.20)
Sodium: 138 mEq/L (ref 135–145)
Total Bilirubin: 0.6 mg/dL (ref 0.3–1.2)
Total Protein: 6.1 g/dL (ref 6.0–8.3)

## 2008-07-26 LAB — KAPPA/LAMBDA LIGHT CHAINS: Kappa free light chain: 120 mg/dL — ABNORMAL HIGH (ref 0.33–1.94)

## 2008-07-26 LAB — SPEP & IFE WITH QIG
Albumin ELP: 59.5 % (ref 55.8–66.1)
Alpha-2-Globulin: 13.6 % — ABNORMAL HIGH (ref 7.1–11.8)
Beta Globulin: 5.2 % (ref 4.7–7.2)
IgA: 41 mg/dL — ABNORMAL LOW (ref 68–378)
IgG (Immunoglobin G), Serum: 591 mg/dL — ABNORMAL LOW (ref 694–1618)
M-Spike, %: 0.34 g/dL
Total Protein, Serum Electrophoresis: 6.2 g/dL (ref 6.0–8.3)

## 2008-08-18 LAB — CBC WITH DIFFERENTIAL/PLATELET
BASO%: 1.2 % (ref 0.0–2.0)
HCT: 39.3 % (ref 34.8–46.6)
LYMPH%: 45.7 % (ref 14.0–49.7)
MCH: 26.5 pg (ref 25.1–34.0)
MCHC: 32.3 g/dL (ref 31.5–36.0)
MCV: 81.9 fL (ref 79.5–101.0)
MONO%: 5.1 % (ref 0.0–14.0)
NEUT%: 45.7 % (ref 38.4–76.8)
Platelets: 145 10*3/uL (ref 145–400)
RBC: 4.8 10*6/uL (ref 3.70–5.45)
nRBC: 0 % (ref 0–0)

## 2008-08-18 LAB — COMPREHENSIVE METABOLIC PANEL
ALT: 23 U/L (ref 0–35)
AST: 25 U/L (ref 0–37)
BUN: 9 mg/dL (ref 6–23)
Calcium: 8.5 mg/dL (ref 8.4–10.5)
Creatinine, Ser: 0.94 mg/dL (ref 0.40–1.20)
Total Bilirubin: 0.6 mg/dL (ref 0.3–1.2)

## 2008-08-22 LAB — KAPPA/LAMBDA LIGHT CHAINS
Kappa free light chain: 100 mg/dL — ABNORMAL HIGH (ref 0.33–1.94)
Lambda Free Lght Chn: 0.48 mg/dL — ABNORMAL LOW (ref 0.57–2.63)

## 2008-08-22 LAB — SPEP & IFE WITH QIG
Alpha-2-Globulin: 13.2 % — ABNORMAL HIGH (ref 7.1–11.8)
Beta 2: 4.6 % (ref 3.2–6.5)
Beta Globulin: 5.1 % (ref 4.7–7.2)
Gamma Globulin: 11 % — ABNORMAL LOW (ref 11.1–18.8)
IgG (Immunoglobin G), Serum: 717 mg/dL (ref 694–1618)
M-Spike, %: 0.4 g/dL

## 2008-09-13 ENCOUNTER — Ambulatory Visit: Payer: Self-pay | Admitting: Oncology

## 2008-09-15 LAB — COMPREHENSIVE METABOLIC PANEL
AST: 20 U/L (ref 0–37)
BUN: 12 mg/dL (ref 6–23)
Calcium: 8.5 mg/dL (ref 8.4–10.5)
Chloride: 108 mEq/L (ref 96–112)
Creatinine, Ser: 1.02 mg/dL (ref 0.40–1.20)

## 2008-09-15 LAB — CBC WITH DIFFERENTIAL/PLATELET
BASO%: 0.9 % (ref 0.0–2.0)
EOS%: 2 % (ref 0.0–7.0)
Eosinophils Absolute: 0.1 10*3/uL (ref 0.0–0.5)
LYMPH%: 31.2 % (ref 14.0–49.7)
MCH: 25.7 pg (ref 25.1–34.0)
MCHC: 31.7 g/dL (ref 31.5–36.0)
MCV: 81.1 fL (ref 79.5–101.0)
MONO%: 7.8 % (ref 0.0–14.0)
Platelets: 180 10*3/uL (ref 145–400)
RBC: 4.35 10*6/uL (ref 3.70–5.45)
nRBC: 0 % (ref 0–0)

## 2008-10-13 LAB — COMPREHENSIVE METABOLIC PANEL
ALT: 28 U/L (ref 0–35)
Alkaline Phosphatase: 96 U/L (ref 39–117)
Potassium: 3.1 mEq/L — ABNORMAL LOW (ref 3.5–5.3)
Sodium: 145 mEq/L (ref 135–145)
Total Bilirubin: 0.9 mg/dL (ref 0.3–1.2)
Total Protein: 5.5 g/dL — ABNORMAL LOW (ref 6.0–8.3)

## 2008-10-13 LAB — CBC WITH DIFFERENTIAL/PLATELET
EOS%: 2.6 % (ref 0.0–7.0)
Eosinophils Absolute: 0.1 10*3/uL (ref 0.0–0.5)
MCH: 26.2 pg (ref 25.1–34.0)
MCV: 81.3 fL (ref 79.5–101.0)
MONO%: 10.4 % (ref 0.0–14.0)
NEUT#: 1.2 10*3/uL — ABNORMAL LOW (ref 1.5–6.5)
RBC: 4.61 10*6/uL (ref 3.70–5.45)
RDW: 20.7 % — ABNORMAL HIGH (ref 11.2–14.5)
lymph#: 1.4 10*3/uL (ref 0.9–3.3)
nRBC: 0 % (ref 0–0)

## 2008-10-17 LAB — IMMUNOFIXATION ELECTROPHORESIS
IgG (Immunoglobin G), Serum: 684 mg/dL — ABNORMAL LOW (ref 694–1618)
IgM, Serum: 30 mg/dL — ABNORMAL LOW (ref 60–263)
Total Protein, Serum Electrophoresis: 6.1 g/dL (ref 6.0–8.3)

## 2008-10-17 LAB — KAPPA/LAMBDA LIGHT CHAINS: Kappa:Lambda Ratio: 81.38 — ABNORMAL HIGH (ref 0.26–1.65)

## 2008-11-04 ENCOUNTER — Encounter: Admission: RE | Admit: 2008-11-04 | Discharge: 2008-11-04 | Payer: Self-pay | Admitting: Orthopedic Surgery

## 2008-11-09 ENCOUNTER — Ambulatory Visit: Payer: Self-pay | Admitting: Oncology

## 2008-11-11 LAB — COMPREHENSIVE METABOLIC PANEL
ALT: 22 U/L (ref 0–35)
CO2: 27 mEq/L (ref 19–32)
Calcium: 8.6 mg/dL (ref 8.4–10.5)
Chloride: 108 mEq/L (ref 96–112)
Creatinine, Ser: 1.13 mg/dL (ref 0.40–1.20)
Glucose, Bld: 80 mg/dL (ref 70–99)

## 2008-11-11 LAB — CBC WITH DIFFERENTIAL/PLATELET
Basophils Absolute: 0.1 10*3/uL (ref 0.0–0.1)
Eosinophils Absolute: 0.1 10*3/uL (ref 0.0–0.5)
HCT: 37.2 % (ref 34.8–46.6)
HGB: 12 g/dL (ref 11.6–15.9)
LYMPH%: 37.8 % (ref 14.0–49.7)
MCV: 82.1 fL (ref 79.5–101.0)
MONO#: 0.3 10*3/uL (ref 0.1–0.9)
MONO%: 10.6 % (ref 0.0–14.0)
NEUT#: 1.5 10*3/uL (ref 1.5–6.5)
NEUT%: 47.4 % (ref 38.4–76.8)
Platelets: 152 10*3/uL (ref 145–400)
WBC: 3.1 10*3/uL — ABNORMAL LOW (ref 3.9–10.3)
nRBC: 0 % (ref 0–0)

## 2008-12-14 LAB — COMPREHENSIVE METABOLIC PANEL
Alkaline Phosphatase: 89 U/L (ref 39–117)
CO2: 29 mEq/L (ref 19–32)
Creatinine, Ser: 0.97 mg/dL (ref 0.40–1.20)
Glucose, Bld: 80 mg/dL (ref 70–99)
Total Bilirubin: 0.8 mg/dL (ref 0.3–1.2)

## 2008-12-14 LAB — CBC WITH DIFFERENTIAL/PLATELET
BASO%: 1.7 % (ref 0.0–2.0)
Basophils Absolute: 0.1 10*3/uL (ref 0.0–0.1)
EOS%: 3.1 % (ref 0.0–7.0)
HGB: 12.1 g/dL (ref 11.6–15.9)
MCH: 26.4 pg (ref 25.1–34.0)
MCHC: 32.4 g/dL (ref 31.5–36.0)
MCV: 81.3 fL (ref 79.5–101.0)
MONO%: 15 % — ABNORMAL HIGH (ref 0.0–14.0)
RDW: 19.7 % — ABNORMAL HIGH (ref 11.2–14.5)

## 2008-12-16 LAB — SPEP & IFE WITH QIG
Albumin ELP: 60.4 % (ref 55.8–66.1)
Alpha-1-Globulin: 6.9 % — ABNORMAL HIGH (ref 2.9–4.9)
Alpha-2-Globulin: 12.8 % — ABNORMAL HIGH (ref 7.1–11.8)
Beta 2: 3.4 % (ref 3.2–6.5)
Beta Globulin: 5.7 % (ref 4.7–7.2)
Gamma Globulin: 10.8 % — ABNORMAL LOW (ref 11.1–18.8)
IgA: 93 mg/dL (ref 68–378)
IgG (Immunoglobin G), Serum: 688 mg/dL — ABNORMAL LOW (ref 694–1618)
IgM, Serum: 34 mg/dL — ABNORMAL LOW (ref 60–263)
M-Spike, %: 0.36 g/dL
Total Protein, Serum Electrophoresis: 6.3 g/dL (ref 6.0–8.3)

## 2008-12-16 LAB — KAPPA/LAMBDA LIGHT CHAINS: Kappa free light chain: 60.7 mg/dL — ABNORMAL HIGH (ref 0.33–1.94)

## 2009-01-10 ENCOUNTER — Ambulatory Visit: Payer: Self-pay | Admitting: Oncology

## 2009-01-12 LAB — CBC WITH DIFFERENTIAL/PLATELET
Basophils Absolute: 0 10*3/uL (ref 0.0–0.1)
Eosinophils Absolute: 0.1 10*3/uL (ref 0.0–0.5)
HCT: 37.6 % (ref 34.8–46.6)
HGB: 12.2 g/dL (ref 11.6–15.9)
LYMPH%: 37.6 % (ref 14.0–49.7)
MCV: 80.7 fL (ref 79.5–101.0)
MONO#: 0.3 10*3/uL (ref 0.1–0.9)
MONO%: 10 % (ref 0.0–14.0)
NEUT#: 1.3 10*3/uL — ABNORMAL LOW (ref 1.5–6.5)
Platelets: 141 10*3/uL — ABNORMAL LOW (ref 145–400)
WBC: 2.8 10*3/uL — ABNORMAL LOW (ref 3.9–10.3)

## 2009-01-16 LAB — SPEP & IFE WITH QIG
Albumin ELP: 56.7 % (ref 55.8–66.1)
Alpha-1-Globulin: 8.6 % — ABNORMAL HIGH (ref 2.9–4.9)
Alpha-2-Globulin: 13 % — ABNORMAL HIGH (ref 7.1–11.8)
Beta 2: 3.4 % (ref 3.2–6.5)
Beta Globulin: 6.1 % (ref 4.7–7.2)
Gamma Globulin: 12.2 % (ref 11.1–18.8)

## 2009-01-16 LAB — COMPREHENSIVE METABOLIC PANEL
ALT: 22 U/L (ref 0–35)
CO2: 25 mEq/L (ref 19–32)
Calcium: 8.2 mg/dL — ABNORMAL LOW (ref 8.4–10.5)
Chloride: 110 mEq/L (ref 96–112)
Creatinine, Ser: 1.11 mg/dL (ref 0.40–1.20)
Glucose, Bld: 109 mg/dL — ABNORMAL HIGH (ref 70–99)
Total Bilirubin: 0.5 mg/dL (ref 0.3–1.2)
Total Protein: 6.2 g/dL (ref 6.0–8.3)

## 2009-01-16 LAB — KAPPA/LAMBDA LIGHT CHAINS
Kappa free light chain: 47.7 mg/dL — ABNORMAL HIGH (ref 0.33–1.94)
Kappa:Lambda Ratio: 39.42 — ABNORMAL HIGH (ref 0.26–1.65)
Lambda Free Lght Chn: 1.21 mg/dL (ref 0.57–2.63)

## 2009-01-16 LAB — LACTATE DEHYDROGENASE: LDH: 155 U/L (ref 94–250)

## 2009-01-20 ENCOUNTER — Emergency Department (HOSPITAL_COMMUNITY): Admission: EM | Admit: 2009-01-20 | Discharge: 2009-01-20 | Payer: Self-pay | Admitting: Emergency Medicine

## 2009-01-20 LAB — BASIC METABOLIC PANEL
BUN: 8 mg/dL (ref 6–23)
CO2: 27 mEq/L (ref 19–32)
Calcium: 8.4 mg/dL (ref 8.4–10.5)
Chloride: 105 mEq/L (ref 96–112)
Creatinine, Ser: 1.17 mg/dL (ref 0.40–1.20)
Glucose, Bld: 90 mg/dL (ref 70–99)

## 2009-02-09 ENCOUNTER — Encounter (INDEPENDENT_AMBULATORY_CARE_PROVIDER_SITE_OTHER): Payer: Self-pay | Admitting: Adult Health

## 2009-02-09 ENCOUNTER — Ambulatory Visit: Payer: Self-pay | Admitting: Internal Medicine

## 2009-02-09 LAB — CONVERTED CEMR LAB
ALT: 28 units/L (ref 0–35)
AST: 24 units/L (ref 0–37)
Albumin: 4.1 g/dL (ref 3.5–5.2)
CO2: 23 meq/L (ref 19–32)
Calcium: 8.4 mg/dL (ref 8.4–10.5)
Chloride: 107 meq/L (ref 96–112)
Cholesterol: 125 mg/dL (ref 0–200)
Eosinophils Absolute: 0.1 10*3/uL (ref 0.0–0.7)
HCT: 39.7 % (ref 36.0–46.0)
Lymphocytes Relative: 45 % (ref 12–46)
Lymphs Abs: 1.4 10*3/uL (ref 0.7–4.0)
MCV: 82.9 fL (ref 78.0–100.0)
Microalb, Ur: 1.71 mg/dL (ref 0.00–1.89)
Monocytes Relative: 12 % (ref 3–12)
Neutrophils Relative %: 38 % — ABNORMAL LOW (ref 43–77)
Platelets: 192 10*3/uL (ref 150–400)
Potassium: 3.2 meq/L — ABNORMAL LOW (ref 3.5–5.3)
RBC: 4.79 M/uL (ref 3.87–5.11)
Sodium: 145 meq/L (ref 135–145)
TSH: 2.167 microintl units/mL (ref 0.350–4.500)
Total CHOL/HDL Ratio: 2.4
Total Protein: 6.7 g/dL (ref 6.0–8.3)
WBC: 3.2 10*3/uL — ABNORMAL LOW (ref 4.0–10.5)

## 2009-02-13 ENCOUNTER — Ambulatory Visit: Payer: Self-pay | Admitting: Oncology

## 2009-02-15 LAB — CBC WITH DIFFERENTIAL/PLATELET
Eosinophils Absolute: 0.1 10*3/uL (ref 0.0–0.5)
MONO#: 0.2 10*3/uL (ref 0.1–0.9)
NEUT#: 1.2 10*3/uL — ABNORMAL LOW (ref 1.5–6.5)
RBC: 4.51 10*6/uL (ref 3.70–5.45)
RDW: 21.2 % — ABNORMAL HIGH (ref 11.2–14.5)
WBC: 2.5 10*3/uL — ABNORMAL LOW (ref 3.9–10.3)
nRBC: 0 % (ref 0–0)

## 2009-02-17 LAB — COMPREHENSIVE METABOLIC PANEL
ALT: 22 U/L (ref 0–35)
CO2: 27 mEq/L (ref 19–32)
Calcium: 8.3 mg/dL — ABNORMAL LOW (ref 8.4–10.5)
Chloride: 109 mEq/L (ref 96–112)
Sodium: 147 mEq/L — ABNORMAL HIGH (ref 135–145)
Total Bilirubin: 0.6 mg/dL (ref 0.3–1.2)
Total Protein: 6 g/dL (ref 6.0–8.3)

## 2009-02-17 LAB — SPEP & IFE WITH QIG
Albumin ELP: 58.3 % (ref 55.8–66.1)
Alpha-1-Globulin: 7 % — ABNORMAL HIGH (ref 2.9–4.9)
Alpha-2-Globulin: 13.1 % — ABNORMAL HIGH (ref 7.1–11.8)
Beta Globulin: 5.7 % (ref 4.7–7.2)
IgG (Immunoglobin G), Serum: 804 mg/dL (ref 694–1618)
Total Protein, Serum Electrophoresis: 6 g/dL (ref 6.0–8.3)

## 2009-02-17 LAB — KAPPA/LAMBDA LIGHT CHAINS: Kappa free light chain: 33.2 mg/dL — ABNORMAL HIGH (ref 0.33–1.94)

## 2009-02-20 ENCOUNTER — Ambulatory Visit (HOSPITAL_COMMUNITY): Admission: RE | Admit: 2009-02-20 | Discharge: 2009-02-20 | Payer: Self-pay | Admitting: Internal Medicine

## 2009-03-14 ENCOUNTER — Ambulatory Visit: Payer: Self-pay | Admitting: Oncology

## 2009-03-16 ENCOUNTER — Ambulatory Visit: Payer: Self-pay | Admitting: Family Medicine

## 2009-03-27 ENCOUNTER — Encounter (INDEPENDENT_AMBULATORY_CARE_PROVIDER_SITE_OTHER): Payer: Self-pay | Admitting: Adult Health

## 2009-03-27 ENCOUNTER — Ambulatory Visit: Payer: Self-pay | Admitting: Internal Medicine

## 2009-03-27 LAB — CONVERTED CEMR LAB
CO2: 22 meq/L (ref 19–32)
Chloride: 108 meq/L (ref 96–112)
Creatinine, Ser: 1.08 mg/dL (ref 0.40–1.20)
Potassium: 3.5 meq/L (ref 3.5–5.3)
Sodium: 145 meq/L (ref 135–145)

## 2009-05-05 ENCOUNTER — Ambulatory Visit: Payer: Self-pay | Admitting: Oncology

## 2009-05-09 LAB — CBC WITH DIFFERENTIAL/PLATELET
BASO%: 1.1 % (ref 0.0–2.0)
Basophils Absolute: 0 10*3/uL (ref 0.0–0.1)
EOS%: 0.8 % (ref 0.0–7.0)
Eosinophils Absolute: 0 10*3/uL (ref 0.0–0.5)
HCT: 41.8 % (ref 34.8–46.6)
HGB: 13.4 g/dL (ref 11.6–15.9)
LYMPH%: 33.4 % (ref 14.0–49.7)
MCH: 27.7 pg (ref 25.1–34.0)
MCHC: 32.1 g/dL (ref 31.5–36.0)
MCV: 86.2 fL (ref 79.5–101.0)
MONO#: 0.3 10*3/uL (ref 0.1–0.9)
MONO%: 7.6 % (ref 0.0–14.0)
NEUT#: 2.1 10*3/uL (ref 1.5–6.5)
NEUT%: 57.1 % (ref 38.4–76.8)
Platelets: 180 10*3/uL (ref 145–400)
RBC: 4.85 10*6/uL (ref 3.70–5.45)
RDW: 20.4 % — ABNORMAL HIGH (ref 11.2–14.5)
WBC: 3.7 10*3/uL — ABNORMAL LOW (ref 3.9–10.3)
lymph#: 1.2 10*3/uL (ref 0.9–3.3)

## 2009-05-11 LAB — SPEP & IFE WITH QIG
Alpha-2-Globulin: 12.4 % — ABNORMAL HIGH (ref 7.1–11.8)
IgA: 151 mg/dL (ref 68–378)
IgG (Immunoglobin G), Serum: 812 mg/dL (ref 694–1618)
M-Spike, %: 0.41 g/dL
Total Protein, Serum Electrophoresis: 6.7 g/dL (ref 6.0–8.3)

## 2009-05-11 LAB — BASIC METABOLIC PANEL
CO2: 26 mEq/L (ref 19–32)
Calcium: 9 mg/dL (ref 8.4–10.5)
Glucose, Bld: 97 mg/dL (ref 70–99)
Sodium: 146 mEq/L — ABNORMAL HIGH (ref 135–145)

## 2009-05-29 ENCOUNTER — Encounter (INDEPENDENT_AMBULATORY_CARE_PROVIDER_SITE_OTHER): Payer: Self-pay | Admitting: Adult Health

## 2009-05-29 ENCOUNTER — Ambulatory Visit: Payer: Self-pay | Admitting: Internal Medicine

## 2009-05-29 LAB — CONVERTED CEMR LAB
BUN: 16 mg/dL (ref 6–23)
CO2: 23 meq/L (ref 19–32)
Chloride: 107 meq/L (ref 96–112)
Potassium: 4.2 meq/L (ref 3.5–5.3)

## 2009-07-07 ENCOUNTER — Ambulatory Visit: Payer: Self-pay | Admitting: Oncology

## 2009-08-28 ENCOUNTER — Ambulatory Visit: Payer: Self-pay | Admitting: Oncology

## 2009-09-01 LAB — CBC WITH DIFFERENTIAL/PLATELET
Basophils Absolute: 0 10*3/uL (ref 0.0–0.1)
Eosinophils Absolute: 0 10*3/uL (ref 0.0–0.5)
HGB: 13.8 g/dL (ref 11.6–15.9)
NEUT#: 1.9 10*3/uL (ref 1.5–6.5)
RBC: 4.89 10*6/uL (ref 3.70–5.45)
RDW: 18.7 % — ABNORMAL HIGH (ref 11.2–14.5)
WBC: 4.9 10*3/uL (ref 3.9–10.3)
lymph#: 2.5 10*3/uL (ref 0.9–3.3)

## 2009-09-05 LAB — SPEP & IFE WITH QIG
Albumin ELP: 58.5 % (ref 55.8–66.1)
Alpha-1-Globulin: 6.3 % — ABNORMAL HIGH (ref 2.9–4.9)
Alpha-2-Globulin: 12.6 % — ABNORMAL HIGH (ref 7.1–11.8)
Gamma Globulin: 12.4 % (ref 11.1–18.8)
IgM, Serum: 29 mg/dL — ABNORMAL LOW (ref 60–263)

## 2009-09-05 LAB — COMPREHENSIVE METABOLIC PANEL
AST: 24 U/L (ref 0–37)
Albumin: 4.3 g/dL (ref 3.5–5.2)
Alkaline Phosphatase: 85 U/L (ref 39–117)
BUN: 9 mg/dL (ref 6–23)
Calcium: 8.8 mg/dL (ref 8.4–10.5)
Chloride: 107 mEq/L (ref 96–112)
Glucose, Bld: 85 mg/dL (ref 70–99)
Potassium: 3.7 mEq/L (ref 3.5–5.3)
Sodium: 142 mEq/L (ref 135–145)
Total Protein: 6.7 g/dL (ref 6.0–8.3)

## 2009-09-05 LAB — KAPPA/LAMBDA LIGHT CHAINS

## 2009-09-06 ENCOUNTER — Encounter (INDEPENDENT_AMBULATORY_CARE_PROVIDER_SITE_OTHER): Payer: Self-pay | Admitting: Adult Health

## 2009-09-06 ENCOUNTER — Ambulatory Visit: Payer: Self-pay | Admitting: Internal Medicine

## 2009-09-06 LAB — CONVERTED CEMR LAB
Albumin: 4.4 g/dL (ref 3.5–5.2)
Alkaline Phosphatase: 79 units/L (ref 39–117)
BUN: 13 mg/dL (ref 6–23)
Calcium: 9.3 mg/dL (ref 8.4–10.5)
Creatinine, Ser: 1.03 mg/dL (ref 0.40–1.20)
Glucose, Bld: 80 mg/dL (ref 70–99)
HDL: 50 mg/dL (ref >39)
LDL Cholesterol: 87 mg/dL (ref 0–99)
Potassium: 3.9 meq/L (ref 3.5–5.3)
Total CHOL/HDL Ratio: 3.2
Triglycerides: 103 mg/dL (ref <150)

## 2009-10-10 ENCOUNTER — Ambulatory Visit: Payer: Self-pay | Admitting: Oncology

## 2009-10-24 LAB — CBC WITH DIFFERENTIAL/PLATELET
BASO%: 0.2 % (ref 0.0–2.0)
Eosinophils Absolute: 0 10*3/uL (ref 0.0–0.5)
MCHC: 33.2 g/dL (ref 31.5–36.0)
MCV: 83.9 fL (ref 79.5–101.0)
MONO%: 7.5 % (ref 0.0–14.0)
NEUT#: 2.5 10*3/uL (ref 1.5–6.5)
RBC: 4.71 10*6/uL (ref 3.70–5.45)
RDW: 17.7 % — ABNORMAL HIGH (ref 11.2–14.5)
WBC: 4.6 10*3/uL (ref 3.9–10.3)
nRBC: 0 % (ref 0–0)

## 2009-10-26 LAB — SPEP & IFE WITH QIG
Albumin ELP: 55.7 % — ABNORMAL LOW (ref 55.8–66.1)
IgA: 59 mg/dL — ABNORMAL LOW (ref 68–378)
IgG (Immunoglobin G), Serum: 984 mg/dL (ref 694–1618)
IgM, Serum: 22 mg/dL — ABNORMAL LOW (ref 60–263)
M-Spike, %: 0.08 g/dL
Total Protein, Serum Electrophoresis: 7.1 g/dL (ref 6.0–8.3)

## 2009-10-26 LAB — KAPPA/LAMBDA LIGHT CHAINS: Kappa free light chain: 500 mg/dL — ABNORMAL HIGH (ref 0.33–1.94)

## 2009-10-26 LAB — COMPREHENSIVE METABOLIC PANEL
ALT: 21 U/L (ref 0–35)
AST: 29 U/L (ref 0–37)
Alkaline Phosphatase: 79 U/L (ref 39–117)
CO2: 25 mEq/L (ref 19–32)
Sodium: 143 mEq/L (ref 135–145)
Total Bilirubin: 0.4 mg/dL (ref 0.3–1.2)
Total Protein: 7.1 g/dL (ref 6.0–8.3)

## 2009-11-24 ENCOUNTER — Ambulatory Visit: Payer: Self-pay | Admitting: Oncology

## 2009-11-29 LAB — CBC WITH DIFFERENTIAL/PLATELET
Basophils Absolute: 0 10*3/uL (ref 0.0–0.1)
EOS%: 2.6 % (ref 0.0–7.0)
Eosinophils Absolute: 0.1 10*3/uL (ref 0.0–0.5)
HGB: 11.9 g/dL (ref 11.6–15.9)
LYMPH%: 42.3 % (ref 14.0–49.7)
MCH: 28.5 pg (ref 25.1–34.0)
MCV: 86.8 fL (ref 79.5–101.0)
MONO%: 7.7 % (ref 0.0–14.0)
NEUT#: 1.1 10*3/uL — ABNORMAL LOW (ref 1.5–6.5)
Platelets: 134 10*3/uL — ABNORMAL LOW (ref 145–400)
RBC: 4.16 10*6/uL (ref 3.70–5.45)

## 2009-12-01 LAB — KAPPA/LAMBDA LIGHT CHAINS: Lambda Free Lght Chn: <0.42 mg/dL — ABNORMAL LOW (ref 0.57–2.63)

## 2009-12-01 LAB — COMPREHENSIVE METABOLIC PANEL
AST: 25 U/L (ref 0–37)
Alkaline Phosphatase: 73 U/L (ref 39–117)
BUN: 14 mg/dL (ref 6–23)
Glucose, Bld: 100 mg/dL — ABNORMAL HIGH (ref 70–99)
Total Bilirubin: 0.5 mg/dL (ref 0.3–1.2)

## 2009-12-01 LAB — SPEP & IFE WITH QIG
Beta Globulin: 5.6 % (ref 4.7–7.2)
IgA: 49 mg/dL — ABNORMAL LOW (ref 68–378)
IgG (Immunoglobin G), Serum: 839 mg/dL (ref 694–1618)
M-Spike, %: 0.51 g/dL
Total Protein, Serum Electrophoresis: 6.5 g/dL (ref 6.0–8.3)

## 2009-12-26 ENCOUNTER — Ambulatory Visit: Payer: Self-pay | Admitting: Oncology

## 2009-12-28 LAB — COMPREHENSIVE METABOLIC PANEL
AST: 36 U/L (ref 0–37)
Albumin: 4.1 g/dL (ref 3.5–5.2)
Alkaline Phosphatase: 79 U/L (ref 39–117)
Glucose, Bld: 70 mg/dL (ref 70–99)
Potassium: 4 mEq/L (ref 3.5–5.3)
Sodium: 143 mEq/L (ref 135–145)
Total Bilirubin: 0.8 mg/dL (ref 0.3–1.2)
Total Protein: 6.2 g/dL (ref 6.0–8.3)

## 2009-12-28 LAB — CBC WITH DIFFERENTIAL/PLATELET
BASO%: 3 % — ABNORMAL HIGH (ref 0.0–2.0)
Basophils Absolute: 0.1 10*3/uL (ref 0.0–0.1)
EOS%: 5.1 % (ref 0.0–7.0)
HGB: 13 g/dL (ref 11.6–15.9)
MCH: 28.3 pg (ref 25.1–34.0)
MCHC: 32.6 g/dL (ref 31.5–36.0)
MCV: 86.8 fL (ref 79.5–101.0)
MONO%: 6.7 % (ref 0.0–14.0)
RBC: 4.58 10*6/uL (ref 3.70–5.45)
RDW: 19.5 % — ABNORMAL HIGH (ref 11.2–14.5)
lymph#: 1.2 10*3/uL (ref 0.9–3.3)

## 2010-01-18 ENCOUNTER — Encounter: Payer: Self-pay | Admitting: Oncology

## 2010-01-18 ENCOUNTER — Ambulatory Visit: Admission: RE | Admit: 2010-01-18 | Discharge: 2010-01-18 | Payer: Self-pay | Admitting: Oncology

## 2010-01-18 ENCOUNTER — Ambulatory Visit: Payer: Self-pay | Admitting: Surgery

## 2010-01-22 LAB — COMPREHENSIVE METABOLIC PANEL
ALT: 36 U/L — ABNORMAL HIGH (ref 0–35)
Albumin: 4 g/dL (ref 3.5–5.2)
Alkaline Phosphatase: 87 U/L (ref 39–117)
CO2: 21 mEq/L (ref 19–32)
Glucose, Bld: 117 mg/dL — ABNORMAL HIGH (ref 70–99)
Potassium: 4.3 mEq/L (ref 3.5–5.3)
Sodium: 141 mEq/L (ref 135–145)
Total Protein: 6.4 g/dL (ref 6.0–8.3)

## 2010-01-22 LAB — CBC WITH DIFFERENTIAL/PLATELET
BASO%: 0.6 % (ref 0.0–2.0)
Eosinophils Absolute: 0 10*3/uL (ref 0.0–0.5)
LYMPH%: 29.2 % (ref 14.0–49.7)
MCHC: 32.5 g/dL (ref 31.5–36.0)
MCV: 88.5 fL (ref 79.5–101.0)
MONO%: 9.1 % (ref 0.0–14.0)
NEUT%: 59.9 % (ref 38.4–76.8)
Platelets: 177 10*3/uL (ref 145–400)
RBC: 3.86 10*6/uL (ref 3.70–5.45)

## 2010-01-22 LAB — APTT: aPTT: 59 seconds — ABNORMAL HIGH (ref 24–37)

## 2010-01-26 ENCOUNTER — Ambulatory Visit: Payer: Self-pay | Admitting: Oncology

## 2010-01-30 LAB — PROTIME-INR
INR: 1.5 — ABNORMAL LOW (ref 2.00–3.50)
Protime: 18 Seconds — ABNORMAL HIGH (ref 10.6–13.4)

## 2010-02-06 LAB — PROTIME-INR: INR: 1.5 — ABNORMAL LOW (ref 2.00–3.50)

## 2010-02-13 LAB — PROTIME-INR: INR: 1.6 — ABNORMAL LOW (ref 2.00–3.50)

## 2010-02-21 LAB — CBC WITH DIFFERENTIAL/PLATELET
BASO%: 2.1 % — ABNORMAL HIGH (ref 0.0–2.0)
EOS%: 3.2 % (ref 0.0–7.0)
HCT: 34.4 % — ABNORMAL LOW (ref 34.8–46.6)
LYMPH%: 39.4 % (ref 14.0–49.7)
MCH: 27.7 pg (ref 25.1–34.0)
MCHC: 31.7 g/dL (ref 31.5–36.0)
MONO#: 0.3 10*3/uL (ref 0.1–0.9)
MONO%: 8.9 % (ref 0.0–14.0)
NEUT%: 46.4 % (ref 38.4–76.8)
Platelets: 144 10*3/uL — ABNORMAL LOW (ref 145–400)
RBC: 3.93 10*6/uL (ref 3.70–5.45)
WBC: 2.8 10*3/uL — ABNORMAL LOW (ref 3.9–10.3)
nRBC: 0 % (ref 0–0)

## 2010-02-21 LAB — PROTIME-INR: Protime: 25.2 Seconds — ABNORMAL HIGH (ref 10.6–13.4)

## 2010-02-27 ENCOUNTER — Ambulatory Visit (HOSPITAL_COMMUNITY): Admission: RE | Admit: 2010-02-27 | Discharge: 2010-02-27 | Payer: Self-pay | Admitting: Oncology

## 2010-03-01 ENCOUNTER — Ambulatory Visit: Payer: Self-pay | Admitting: Oncology

## 2010-03-01 LAB — CBC WITH DIFFERENTIAL/PLATELET
Basophils Absolute: 0 10*3/uL (ref 0.0–0.1)
EOS%: 4.6 % (ref 0.0–7.0)
HCT: 33.3 % — ABNORMAL LOW (ref 34.8–46.6)
HGB: 10.8 g/dL — ABNORMAL LOW (ref 11.6–15.9)
LYMPH%: 33.1 % (ref 14.0–49.7)
MCH: 28.7 pg (ref 25.1–34.0)
MCHC: 32.3 g/dL (ref 31.5–36.0)
MCV: 88.8 fL (ref 79.5–101.0)
MONO%: 7.3 % (ref 0.0–14.0)
NEUT%: 54.2 % (ref 38.4–76.8)

## 2010-03-01 LAB — COMPREHENSIVE METABOLIC PANEL
Alkaline Phosphatase: 65 U/L (ref 39–117)
Glucose, Bld: 86 mg/dL (ref 70–99)
Sodium: 144 mEq/L (ref 135–145)
Total Bilirubin: 0.7 mg/dL (ref 0.3–1.2)
Total Protein: 5.8 g/dL — ABNORMAL LOW (ref 6.0–8.3)

## 2010-03-05 LAB — SPEP & IFE WITH QIG
Alpha-2-Globulin: 13 % — ABNORMAL HIGH (ref 7.1–11.8)
Gamma Globulin: 10.7 % — ABNORMAL LOW (ref 11.1–18.8)
IgA: 45 mg/dL — ABNORMAL LOW (ref 68–378)
IgG (Immunoglobin G), Serum: 659 mg/dL — ABNORMAL LOW (ref 694–1618)
IgM, Serum: 16 mg/dL — ABNORMAL LOW (ref 60–263)
M-Spike, %: 0.39 g/dL

## 2010-03-05 LAB — KAPPA/LAMBDA LIGHT CHAINS

## 2010-03-16 LAB — PROTHROMBIN TIME: Prothrombin Time: 18.8 seconds — ABNORMAL HIGH (ref 11.6–15.2)

## 2010-03-21 LAB — COMPREHENSIVE METABOLIC PANEL
ALT: 24 U/L (ref 0–35)
Albumin: 4 g/dL (ref 3.5–5.2)
CO2: 21 mEq/L (ref 19–32)
Calcium: 8.6 mg/dL (ref 8.4–10.5)
Chloride: 109 mEq/L (ref 96–112)
Potassium: 3.8 mEq/L (ref 3.5–5.3)
Sodium: 144 mEq/L (ref 135–145)
Total Protein: 6 g/dL (ref 6.0–8.3)

## 2010-03-21 LAB — CBC WITH DIFFERENTIAL/PLATELET
Basophils Absolute: 0.1 10*3/uL (ref 0.0–0.1)
Eosinophils Absolute: 0.1 10*3/uL (ref 0.0–0.5)
HGB: 11.9 g/dL (ref 11.6–15.9)
MCV: 87.3 fL (ref 79.5–101.0)
MONO%: 7.8 % (ref 0.0–14.0)
NEUT#: 1.2 10*3/uL — ABNORMAL LOW (ref 1.5–6.5)
RDW: 20.3 % — ABNORMAL HIGH (ref 11.2–14.5)
lymph#: 0.9 10*3/uL (ref 0.9–3.3)

## 2010-03-21 LAB — TECHNOLOGIST REVIEW

## 2010-03-21 LAB — PROTHROMBIN TIME: Prothrombin Time: 22.9 seconds — ABNORMAL HIGH (ref 11.6–15.2)

## 2010-04-27 ENCOUNTER — Ambulatory Visit: Payer: Self-pay | Admitting: Oncology

## 2010-05-01 LAB — COMPREHENSIVE METABOLIC PANEL
BUN: 8 mg/dL (ref 6–23)
CO2: 26 mEq/L (ref 19–32)
Calcium: 8.1 mg/dL — ABNORMAL LOW (ref 8.4–10.5)
Chloride: 102 mEq/L (ref 96–112)
Creatinine, Ser: 1.04 mg/dL (ref 0.40–1.20)
Glucose, Bld: 81 mg/dL (ref 70–99)

## 2010-05-01 LAB — CBC WITH DIFFERENTIAL/PLATELET
BASO%: 1.9 % (ref 0.0–2.0)
Basophils Absolute: 0.1 10*3/uL (ref 0.0–0.1)
Eosinophils Absolute: 0.1 10*3/uL (ref 0.0–0.5)
HCT: 38.1 % (ref 34.8–46.6)
HGB: 12.2 g/dL (ref 11.6–15.9)
LYMPH%: 40.6 % (ref 14.0–49.7)
MONO#: 0.2 10*3/uL (ref 0.1–0.9)
NEUT#: 1.2 10*3/uL — ABNORMAL LOW (ref 1.5–6.5)
NEUT%: 45.9 % (ref 38.4–76.8)
Platelets: 148 10*3/uL (ref 145–400)
WBC: 2.7 10*3/uL — ABNORMAL LOW (ref 3.9–10.3)
lymph#: 1.1 10*3/uL (ref 0.9–3.3)

## 2010-05-01 LAB — PROTHROMBIN TIME: Prothrombin Time: 28.4 seconds — ABNORMAL HIGH (ref 11.6–15.2)

## 2010-06-27 ENCOUNTER — Ambulatory Visit: Payer: Self-pay | Admitting: Oncology

## 2010-07-15 ENCOUNTER — Encounter: Payer: Self-pay | Admitting: Orthopedic Surgery

## 2010-07-16 ENCOUNTER — Encounter: Payer: Self-pay | Admitting: Orthopedic Surgery

## 2010-07-18 LAB — CBC WITH DIFFERENTIAL/PLATELET
BASO%: 2.3 % — ABNORMAL HIGH (ref 0.0–2.0)
Basophils Absolute: 0.1 10*3/uL (ref 0.0–0.1)
Eosinophils Absolute: 0.1 10*3/uL (ref 0.0–0.5)
HCT: 38.3 % (ref 34.8–46.6)
HGB: 12.4 g/dL (ref 11.6–15.9)
LYMPH%: 39.9 % (ref 14.0–49.7)
MCHC: 32.4 g/dL (ref 31.5–36.0)
MONO#: 0.3 10*3/uL (ref 0.1–0.9)
NEUT#: 1.2 10*3/uL — ABNORMAL LOW (ref 1.5–6.5)
NEUT%: 45.3 % (ref 38.4–76.8)
Platelets: 150 10*3/uL (ref 145–400)
WBC: 2.6 10*3/uL — ABNORMAL LOW (ref 3.9–10.3)
lymph#: 1.1 10*3/uL (ref 0.9–3.3)

## 2010-07-20 LAB — COMPREHENSIVE METABOLIC PANEL
AST: 21 U/L (ref 0–37)
Albumin: 4.2 g/dL (ref 3.5–5.2)
BUN: 12 mg/dL (ref 6–23)
CO2: 24 mEq/L (ref 19–32)
Calcium: 8.7 mg/dL (ref 8.4–10.5)
Chloride: 105 mEq/L (ref 96–112)
Creatinine, Ser: 1.11 mg/dL (ref 0.40–1.20)
Glucose, Bld: 83 mg/dL (ref 70–99)
Potassium: 3.5 mEq/L (ref 3.5–5.3)

## 2010-07-20 LAB — KAPPA/LAMBDA LIGHT CHAINS: Kappa free light chain: 167 mg/dL — ABNORMAL HIGH (ref 0.33–1.94)

## 2010-07-20 LAB — SPEP & IFE WITH QIG
Alpha-1-Globulin: 6.7 % — ABNORMAL HIGH (ref 2.9–4.9)
Alpha-2-Globulin: 12.6 % — ABNORMAL HIGH (ref 7.1–11.8)
Beta Globulin: 6.2 % (ref 4.7–7.2)
Gamma Globulin: 10.2 % — ABNORMAL LOW (ref 11.1–18.8)
IgG (Immunoglobin G), Serum: 746 mg/dL (ref 694–1618)
Total Protein, Serum Electrophoresis: 6.4 g/dL (ref 6.0–8.3)

## 2010-08-13 ENCOUNTER — Encounter (INDEPENDENT_AMBULATORY_CARE_PROVIDER_SITE_OTHER): Payer: Self-pay | Admitting: *Deleted

## 2010-08-13 LAB — CONVERTED CEMR LAB: Microalb, Ur: 2.97 mg/dL — ABNORMAL HIGH (ref 0.00–1.89)

## 2010-09-06 LAB — CBC
MCH: 29.2 pg (ref 26.0–34.0)
MCHC: 32.9 g/dL (ref 30.0–36.0)
MCV: 88.6 fL (ref 78.0–100.0)
Platelets: 181 10*3/uL (ref 150–400)

## 2010-09-06 LAB — PROTIME-INR: Prothrombin Time: 17.1 seconds — ABNORMAL HIGH (ref 11.6–15.2)

## 2010-09-18 ENCOUNTER — Encounter (INDEPENDENT_AMBULATORY_CARE_PROVIDER_SITE_OTHER): Payer: Self-pay | Admitting: *Deleted

## 2010-09-18 LAB — CONVERTED CEMR LAB
ALT: 40 units/L — ABNORMAL HIGH (ref 0–35)
Alkaline Phosphatase: 112 units/L (ref 39–117)
Creatinine, Ser: 1.86 mg/dL — ABNORMAL HIGH (ref 0.40–1.20)
Glucose, Bld: 89 mg/dL (ref 70–99)
LDL Cholesterol: 100 mg/dL — ABNORMAL HIGH (ref 0–99)
Sodium: 144 meq/L (ref 135–145)
Total Bilirubin: 0.6 mg/dL (ref 0.3–1.2)
Total CHOL/HDL Ratio: 3.6
Total Protein: 7.3 g/dL (ref 6.0–8.3)
Triglycerides: 108 mg/dL (ref <150)
VLDL: 22 mg/dL (ref 0–40)

## 2010-09-28 ENCOUNTER — Encounter (HOSPITAL_BASED_OUTPATIENT_CLINIC_OR_DEPARTMENT_OTHER): Payer: Medicaid Other | Admitting: Oncology

## 2010-09-28 ENCOUNTER — Other Ambulatory Visit: Payer: Self-pay | Admitting: Medical

## 2010-09-28 DIAGNOSIS — C9000 Multiple myeloma not having achieved remission: Secondary | ICD-10-CM

## 2010-09-28 DIAGNOSIS — Z7901 Long term (current) use of anticoagulants: Secondary | ICD-10-CM

## 2010-09-28 DIAGNOSIS — Z86718 Personal history of other venous thrombosis and embolism: Secondary | ICD-10-CM

## 2010-09-28 LAB — PROTIME-INR

## 2010-09-28 LAB — CBC WITH DIFFERENTIAL/PLATELET
BASO%: 3.6 % — ABNORMAL HIGH (ref 0.0–2.0)
Basophils Absolute: 0.1 10*3/uL (ref 0.0–0.1)
EOS%: 2 % (ref 0.0–7.0)
HCT: 44.8 % (ref 34.8–46.6)
HGB: 15 g/dL (ref 11.6–15.9)
LYMPH%: 33.3 % (ref 14.0–49.7)
MCH: 26.9 pg (ref 25.1–34.0)
MCHC: 33.5 g/dL (ref 31.5–36.0)
MCV: 80.3 fL (ref 79.5–101.0)
MONO%: 14.7 % — ABNORMAL HIGH (ref 0.0–14.0)
NEUT%: 46.4 % (ref 38.4–76.8)
lymph#: 0.8 10*3/uL — ABNORMAL LOW (ref 0.9–3.3)

## 2010-10-02 LAB — COMPREHENSIVE METABOLIC PANEL
Albumin: 4.6 g/dL (ref 3.5–5.2)
BUN: 18 mg/dL (ref 6–23)
CO2: 24 mEq/L (ref 19–32)
Calcium: 9.4 mg/dL (ref 8.4–10.5)
Chloride: 103 mEq/L (ref 96–112)
Creatinine, Ser: 1.43 mg/dL — ABNORMAL HIGH (ref 0.40–1.20)
Glucose, Bld: 90 mg/dL (ref 70–99)
Potassium: 4.1 mEq/L (ref 3.5–5.3)

## 2010-10-02 LAB — IMMUNOFIXATION ELECTROPHORESIS
IgA: 88 mg/dL (ref 68–378)
IgM, Serum: 24 mg/dL — ABNORMAL LOW (ref 60–263)
Total Protein, Serum Electrophoresis: 7 g/dL (ref 6.0–8.3)

## 2010-11-01 ENCOUNTER — Emergency Department (HOSPITAL_COMMUNITY)
Admission: EM | Admit: 2010-11-01 | Discharge: 2010-11-01 | Disposition: A | Discharge status: Home / Self Care | Payer: Medicaid Other | Attending: Emergency Medicine | Admitting: Emergency Medicine

## 2010-11-01 DIAGNOSIS — A499 Bacterial infection, unspecified: Secondary | ICD-10-CM | POA: Insufficient documentation

## 2010-11-01 DIAGNOSIS — E119 Type 2 diabetes mellitus without complications: Secondary | ICD-10-CM | POA: Insufficient documentation

## 2010-11-01 DIAGNOSIS — N760 Acute vaginitis: Secondary | ICD-10-CM | POA: Insufficient documentation

## 2010-11-01 DIAGNOSIS — B9689 Other specified bacterial agents as the cause of diseases classified elsewhere: Secondary | ICD-10-CM | POA: Insufficient documentation

## 2010-11-01 LAB — URINE MICROSCOPIC-ADD ON

## 2010-11-01 LAB — URINALYSIS, ROUTINE W REFLEX MICROSCOPIC
Bilirubin Urine: NEGATIVE
Nitrite: NEGATIVE
Specific Gravity, Urine: 1.015 (ref 1.005–1.030)
Urobilinogen, UA: 0.2 mg/dL (ref 0.0–1.0)
pH: 6.5 (ref 5.0–8.0)

## 2010-11-01 LAB — WET PREP, GENITAL
Trich, Wet Prep: NONE SEEN
Yeast Wet Prep HPF POC: NONE SEEN

## 2010-11-06 NOTE — Discharge Summary (Signed)
[name], [name]            ACCOUNT NO.:  [id number]   MEDICAL RECORD NO.:  [id number]          PATIENT TYPE:  INP   LOCATION:  1340                         FACILITY:  [facility]   PHYSICIAN:  Elliot Cousin, M.D.    DATE OF BIRTH:  Sep 06, 1950   DATE OF ADMISSION:  11/28/2006  DATE OF DISCHARGE:                               DISCHARGE SUMMARY   DISCHARGE DIAGNOSES:  1. Light chain myeloma (new diagnosis).  2. Acute renal failure secondary to myeloma.  3. Normocytic anemia secondary to renal failure and myeloma, status      post 2 units or packed red blood cell transfusions during the      hospital course.  4. Bone pain secondary to lytic myelomatous lesions and L2 compression      fracture.  5. Type 2 diabetes mellitus. Hemoglobin A1c 6.2.  6. Hypertension. Lisinopril discontinued during the hospital course      secondary to acute renal failure.  7. Presenting symptoms consisted of nausea and tachycardia. Both      resolved during the hospitalization.  8. Deconditioning.   SECONDARY DISCHARGE DIAGNOSES:  1. Gastroesophageal reflux disease.  2. Obesity.  3. Status post breast reduction surgery in the past.  4. Status post gastric banding and panniculectomy.  5. History of depression.  6. Chronic low back pain.   DISCHARGE MEDICATIONS:  To be determined at the time of hospital  discharge.   CONSULTATIONS:  1. Dr. Arrie Aran and Dr. Kathrene Bongo of [facility].  2. Dr. Clelia Croft, oncologist.   PROCEDURES PERFORMED:  1. Bone scan performed on 12/02/06. The results revealed lytic lesions      involving the calvarium, the right and left mandible, right and      left clavicles, right glenoid and proximal humeri, healed fractures      of right anterior second rib, possible lytic lesion T12 body, L2      compression fracture, age and etiology indeterminate, L4 and L5      anterolisthesis, moderately-large lytic lesion in the inferior and      medial  aspect of the right iliac bone adjacent to the SI joint,      small lytic foci in the distal aspect of the right humerus, left      humeral shaft.  2. Ultrasound of the kidneys on 11/29/06. The results revealed no      evidence for focal renal abnormalities. The right kidney measured      14.6 cm and the left kidney measured 14.2 cm.   HISTORY OF PRESENT ILLNESS:  The patient is a 60 year old woman with a  past medical history significant for hypertension, diabetes mellitus and  obesity. She presented to the emergency department on 11/28/06, with a  chief complaint of generalized nausea and vomiting. During the  evaluation in the emergency department, the patient was found to be in  renal failure with a BUN of 82 and a creatinine of 8.26. The patient  voiced that she had no prior history of renal failure or insufficiency.  The patient was therefore admitted for further evaluation and  management.   For additional details please see the dictated history and physical by  Dr. Luciana Axe.   HOSPITAL COURSE:  1. LIGHT CHAIN MYELOMA (NEW DIAGNOSIS). The patient was initially      started on gentle IV fluids as a challenge for treatment of acute      renal failure. Following no resolution in the patient's  acute      renal failure with IV fluids, nephrology was consulted. The initial      consultation was provided by nephrologist, Dr. Arrie Aran. For      further evaluation, he ordered a renal ultrasound, SPEP, UPEP,      urine eosinophils, acute viral hepatitis panel, and other various      studies. The results of the renal ultrasound, as indicated above,      revealed no significant abnormalities. The urine eosinophil was      essentially negative. The viral hepatitis panel was also completely      negative. Complement (C3) was well within normal limits at 168 and      the C4 complement was well within normal limits at 45. The ANA was      completely negative. The ASO titer was less than 25,  essentially      negative. The urine immunofixation revealed monoclonal free kappa      light chains (Bence-Jones protein). The immunofixation add-on      revealed monoclonal IgG kappa protein with monoclonal free kappa      light chains. The IgG/IgA/IgM immunoglobulin assay revealed an IgA      of 42, IgG of 758, and an IgM of 18.   Given these findings, oncologist Dr. Clelia Croft, was consulted. Per his  assessment, more than likely, the patient had light chain myeloma. He  recommended starting treatment with Decadron initially at 40 mg daily  for 4 days in an attempt to decrease the light chain production. Per his  assessment, she will probably need further treatment in the outpatient  setting.   The patient did complain of diffuse musculoskeletal pain during the  hospitalization. Therefore, a bone scan was ordered for further  evaluation. The bone scan revealed multiple lytic lesions and an L2  compression fracture, age indeterminate. The patient was initially  started on Vicodin as needed for pain. Following the findings of the  bone scan, OxyContin at 20 mg b.i.d. was added. Currently, the patient  says that her pain is well controlled. She has actually been ambulating  with physical therapy  almost daily with minimal discomfort.   1. ACUTE RENAL FAILURE SECONDARY TO MYELOMA. On admission the      patient's  BUN was 82 and her creatinine was 8.26. As stated above,      she was evaluated with various tests and studies in an  attempt to      find out why the patient presented in acute renal failure. Per the      history provided, the patient's primary care physicians never      voiced that she had any abnormalities regarding her kidneys. As      stated above, she was started on gentle IV fluids. Her BUN and      creatinine were followed. Her renal function did not improve with      IV fluids. It was noted that the patient had been taking NSAIDs and     hydrochlorothiazide as well as  lisinopril prior to the      hospitalization. These medications were  discontinued. During the      hospital course, the patient had excellent urine output.  Following      the start of Decadron, the patient's  creatinine progressively      improved. However, it is still somewhat abnormal. As of today, her      creatinine is 5.3. As indicated above, Dr. Arrie Aran,      nephrologist, was consulted. He or Dr. Kathrene Bongo, who provided      the follow-up nephrology consultation, will follow up with the      patient in the outpatient setting per their discretion. Hopefully,      with treatment of the myeloma, the patient's  renal function will      improve. Although the patient presented with nausea, there were no      signs of uremia during the hospitalization. As indicated above, the      renal ultrasound was essentially unremarkable.   1. HYPERKALEMIA. During the hospital course, at times, the patient's      serum potassium increased to greater than 5.2. She was treated      intermittently with Kayexalate and gentle IV fluids with half-      normal saline. Yesterday her potassium was 5.3 and today it is 4.8.      More than likely, the hyperkalemia is secondary to renal failure.   1. NORMOCYTIC ANEMIA. On admission, the patient's  hemoglobin was 8.7      and her MCV was 82. She was typed and crossed 2 units of packed red      blood cells and transfused both units. There was no evidence of GI      or GU blood loss during the hospital course. Iron studies were      ordered and revealed a total iron of 75, TIBC of 31 and ferritin of      719. Her TSH was also assessed and found to be within normal limits      at 0.633. The patient was started on a multivitamin once daily.      Following the transfusions, the patient's  hemoglobin improved      appropriately to 9.5. As of yesterday, her hemoglobin has remained      stable.   1. TYPE 2 DIABETES MELLITUS. The patient had been treated with       Glucophage in the outpatient setting. The Glucophage was      discontinued during the hospital course because of the acute renal      failure. The patient was subsequently started on a sliding-scale      insulin regimen with NovoLog q.a.c. and at bedtime. Her hemoglobin      A1c was ordered and found to be rather reasonable at 6.2. Following      Decadron therapy, her capillary blood sugars became a bit more      uncontrolled. She was therefore started on Lantus and a small dose      of Amaryl. Following the discontinuation of Decadron, more than      likely, the patient will not need Lantus or Amaryl. The patient was      advised to discontinue Glucophage indefinitely for now.   1. HYPERTENSION. Prior to the hospitalization, the patient was treated      with lisinopril and hydrochlorothiazide. Both medications were      discontinued during the hospital course because of acute renal     failure. Her blood pressures have actually remained relatively  within normal limits off of these medications. For now, the patient      will be treated with diet alone.   1. OBESITY WITH DECONDITIONING AND LYTIC BONE LESIONS. As indicated      above, the patient complained of diffuse musculoskeletal pain. The      bone scan did reveal multiple lytic bone lesions and an L2      compression fracture. The physical and occupational therapists were      both consulted. They recommended a rolling walker and home PT and      OT. The patient's pain was treated with OxyContin and Vicodin. She      appears to be a bit more comfortable, although she is slightly      deconditioned. She remains motivated to walk daily with the rolling      walker in the hospital.   The patient is approaching medical stability for discharge.      Elliot Cousin, M.D.  Electronically Signed     DF/MEDQ  D:  12/07/2006  T:  12/07/2006  Job:  [id number]   cc:   Blenda Nicely. Clelia Croft   Terrial Rhodes, M.D.  Fax: [phone]    Cecille Aver, M.D.  Fax: [phone]

## 2010-11-06 NOTE — Consult Note (Signed)
[name], [name] NO.:  [id number]   MEDICAL RECORD NO.:  [id number]          PATIENT TYPE:  INP   LOCATION:  1442                         FACILITY:  [facility]   PHYSICIAN:  Terrial Rhodes, M.D.DATE OF BIRTH:  Mar 16, 1951   DATE OF CONSULTATION:  12/01/2006  DATE OF DISCHARGE:                                 CONSULTATION   REASON FOR CONSULTATION:  Renal failure.   HISTORY OF PRESENT ILLNESS:  Mrs. [name] is a 60 year old African-  American female with multiple medical problems most notable for morbid  obesity, hypertension, diabetes mellitus, gastroesophageal reflux  disease who presented to [facility] with a 2-week  history of nausea, vomiting, diarrhea and malaise.  Workup revealed a  hemoglobin of 8.7, a BUN of 82 and creatinine of 8.3.  She was admitted  for IV fluids and we were asked to evaluate her renal failure.  The  patient denies any previous knowledge of kidney disease although she did  have significant proteinuria on urinalysis in March 2008.  However, we  do not have any creatinines available 2005 in which her creatinine was  0.8 at that time.  Of note, the patient was taking lisinopril,  hydrochlorothiazide, as well as ibuprofen concomitantly during her  episode of nausea, vomiting and diarrhea.  She has also been seen every  month for the last 6 months due to different pains.   ALLERGIES:  MORPHINE, which causes hives.   PAST MEDICAL HISTORY:  1. Hypertension for 36 years.  2. Diabetes for 14 years.  3. Morbid obesity status post gastric banding and panniculectomy.  4. Gastroesophageal reflux disease.  5. History of breast reduction surgery.  6. Depression  7. Degenerative joint disease.  8. Chronic back pain.  9. History of trigger thumb.  10.Depression.  11.History of overactive bladder.   CURRENT MEDICATIONS:  1. Lisinopril unknown dose one a day.  2. Lipitor unknown dose.  3. Glucophage 500 mg b.i.d.  4.  Hydrochlorothiazide 25 mg a day.  5. Prilosec 20 mg a day.  6. Percocet p.r.n.  7. Ibuprofen unknown dose every 4-6 hours.   SOCIAL HISTORY:  She lives in [place], is not working.  She is  married for 5 years, no children.  Quit tobacco and alcohol 14 years  ago.  Has two tattoos that she got about 9 years ago.  Denies any IV  drug use.   FAMILY HISTORY:  Mother died in her 100s from coronary disease.  Father  died at age 14 from a stroke.  She has some cousins with kidney disease  on dialysis but no one else.  No first-degree relatives with kidney  disease.   REVIEW OF SYSTEMS:  As per HPI.  She did have some nausea and diarrhea  prior to admission, since then has resolved.  HEENT:  No tinnitus,  dysphagia, odynophagia.  CARDIAC:  No chest pain, palpitations,  orthopnea, PND.  PULMONARY:  No shortness of breath, hemoptysis,  productive cough.  GI:  Nausea, vomiting, diarrhea 2 weeks prior to  admission.  No hematochezia, melena or bright red blood per rectum.  GU:  No dysuria, pyuria, hematuria, urgency, frequent or retention.  RHEUMATOLOGIC:  She has rib pain, shoulder pain and back pain that is  chronic.  DERMATOLOGIC:  No rashes, lumps or bumps.  HEMATOLOGIC:  No  abnormal bleeding or bruising.  All other systems negative.   PHYSICAL EXAMINATION:  GENERAL:  This is an obese female sitting in bed  in no apparent distress.  VITAL SIGNS:  Temperature is 98.2, pulse 98, blood pressure 155/88,  respiratory rate 24.  Her intake was at 80, her output was 1800 mL.  HEENT:  Head normocephalic, atraumatic.  Pupils equal and round,  reactive to light.  Extraocular muscles intact.  No icterus.  Oropharynx  without lesions.  NECK:  Supple with full range of motion, no lymphadenopathy or bruits.  LUNGS:  Clear to auscultation and percussion bilaterally.  No rales or  rhonchi.  CARDIAC:  Regular rate and rhythm at 98 with a 2/6 systolic ejection  murmur heard best at the left upper  sternal border.  ABDOMEN:  Obese, normoactive bowel sounds, soft, nontender, no guarding  or rebound.  EXTREMITIES:  She had trace pretibial edema bilaterally.  There are 1+  pedal pulses bilaterally.   LABORATORIES:  Her sodium was 143, potassium 4.7, chloride 114, CO2 22,  BUN 74, creatinine 7.28, glucose 89, calcium 9.7.  Her hemoglobin was  8.6, white blood cell count 4.7, platelets 108.  Hemoglobin A1c was  6.2%.  Urinalysis had 30 mg/dL protein, trace blood, trace leukocyte  esterase, 0-2 white blood cells, 0-2 red blood cells.  Her complement  levels were within normal limits   ASSESSMENT AND PLAN:  1. Renal failure.  It is unclear if this is acute-on-chronic or if      this is acute renal failure since we do not have any previous      creatinine levels available over the last 3 years.  She did have      proteinuria 3 months ago when she presented to the emergency room      with another musculoskeletal complaint.  The overall history seems      most consistent with acute tubular necrosis, given her history of      an ACE inhibitor, hydrochlorothiazide and nonsteroidals in the      setting of diarrhea and volume contraction.  Also on the      differential would be multiple myeloma, given her anemia and renal      failure, as well as glomerulonephritis and renal vascular disease      or acute interstitial nephritis, and even amyloidosis.  In the      meantime, will continue to hold the diuretic and ACE inhibitor and      nonsteroidals.  Will check an SPEP, UPEP, and serologies.  Her      renal ultrasound showed normal-sized kidneys at 14 cm bilaterally,      no obstruction, and will continue to follow with IV fluids.  If her      creatinine continues to worsen, will need to transfer her over to      [facility] for possibility of dialysis.  Given her obesity, will      make a biopsy difficult but we may want to try a CT-guided biopsy     as well if her kidney function continues  to worsen, but in the      meantime will try to obtain outpatient records from [facility].  2. Nausea, vomiting,  diarrhea.  This is improving.  Possibly viral      gastroenteritis versus diabetic gastroparesis versus food poisoning      versus vasculitis.  Will continue to follow, continue with      supportive care.  3. Anemia.  This is a normocytic anemia and she was asymptomatic from      this.  Question whether this is blood-loss anemia versus anemia due      to chronic disease such as kidney disease, or she may have multiple      myeloma.  Workup is in progress.  Will check iron studies in the      morning.  4. Obesity.  Again stress weight loss.  5. Degenerative joint disease.  Will avoid nonsteroidals.  Continue      with p.r.n. Vicodin and Percocet.  6. Questionable drug-seeking behavior.  The patient has been to the      emergency room every month for the last 6 months and received a      prescription for narcotics each time, and each time she has gone      she has complained of      a different joint bothering her.  Consider calling her pharmacy to      see if she is getting narcotics from other sources other than the      emergency department as well.   Thank you for this consult, will continue to follow along with you.           ______________________________  Terrial Rhodes, M.D.     JC/MEDQ  D:  12/01/2006  T:  12/01/2006  Job:  [id number]

## 2010-11-06 NOTE — Consult Note (Signed)
[name], SCHLIE            ACCOUNT NO.:  [id number]   MEDICAL RECORD NO.:  [id number]          PATIENT TYPE:  INP   LOCATION:  1340                         [facility]ACILITY:  [facility]   PHYSICIAN:  Blenda Nicely. Shadad        DATE OF BIRTH:  1950/07/10   DATE OF CONSULTATION:  12/04/2006  DATE OF DISCHARGE:  12/08/2006                                 CONSULTATION   REFERRING PHYSICIAN:  [facility].   REASON FOR CONSULTATION:  Multiple myeloma.   HISTORY OF PRESENT ILLNESS:  Ms. Angello is an 60 year old African  American female asked to see for evaluation of multiple myeloma.  She  has multiple medical problems listed below, admitted to [facility] with a 2-week history of nausea, vomiting, diarrhea and  generalized malaise.  She was found to have anemia with a hemoglobin of  8.7, renal insufficiency with a creatinine of 8.3.  Renal Service was  consulted, UA was positive for proteinuria, as well as hypercalcemia.  Multiple myeloma was suspected, that workup begun.  Urine immunofixation  showed monoclonal free kappa light chains.  SPEP showed 2 monoclonal  spikes, 1 of 0.1 and then the other one of 0.52 g/dL.  Immunofixation  showed serum Ig kappa.  Quantitative immunoglobulins showed IgG of 758,  which is normal. IgA was low at 42 and IgM was 18.  Her LDH was normal.  Sed rate is high at 95.  Her hemoglobin went from 7.4 to 9.6.  Clinically, she is feeling better, slowly improving.  Of note, she also  had a metastatic bone survey which revealed multiple lytic lesions in  the calvarium, mandible, clavicles, right scapular glenoid, proximal  humerus, body of the T2, L2 compression fracture, left iliac bone  involvement, as well as distal right humerus, left humeral shaft areas.  Bone scan is pending.  We were asked to see the patient, anticipating  malignancy.   PAST MEDICAL HISTORY:  1. Acute renal failure, back in May 2008 with her creatinine of 0.6.  2. Morbid obesity, status  post gastric band.  3. Hypertension.  4. Diabetes.  5. GERD.  6. Depression.  7. DJD.  8. History of trigger thumb.  9. History of overactive bladder.  10.Remote tobacco history.  11.Remote alcohol history.  12.Anemia as described above.  The patient admits to having had anemia      in the past, taking iron supplements.   SURGICAL HISTORY:  1. Status post gastric banding.  2. Panniculectomy.  3. Status post breast reduction surgery.   ALLERGIES:  MORPHINE SULFATE causes hives.   CURRENT MEDICATIONS:  OxyContin, aspirin, Aranesp, NovoLog, Senokot,  Tylenol, Dulcolax, Valium, Norco, Dilaudid, Zofran.   REVIEW OF SYSTEMS:  Remarkable for productive cough, constipation and  musculoskeletal pain, as well as fatigue.  The musculoskeletal pain is  at the areas described above.  The rest of the review of systems is  negative.   FAMILY HISTORY:  Mother died in her 18s with CAD.  Father died at 45  with stroke.   SOCIAL HISTORY:  The patient is married.  No children.  Unemployed.  No  alcohol or tobacco for the last 14 years.  Lives in [place].   PHYSICAL EXAMINATION:  GENERAL:  This is a morbidly obese 60 year old  African American female in no acute distress, alert and oriented x3.  VITAL SIGNS:  Blood pressure 137/86, pulse 83, respirations 20,  temperature 98.7, pulse oximetry 100% on room air.  HEENT:  Normocephalic, atraumatic.  Sclerae anicteric.  Oral mucosa  without thrush or lesions.  NECK:  Supple.  No cervical or supraclavicular masses.  LUNGS:  Decreased breath sounds at the bases, no wheezing, rhonchi or  rales.  No axillary masses.  BREASTS:  Without masses.  CARDIOVASCULAR:  Regular rate and rhythm with a 2/6 systolic murmur in  the left upper sternal border.  No rubs or gallops.  ABDOMEN:  Obese, nontender.  Bowel sounds x4.  No palpable spleen or  liver.  GU AND RECTAL:  Deferred.  EXTREMITIES:  No clubbing or cyanosis.  Trace of edema bilaterally.   MUSCULOSKELETAL:  There is tenderness in the lumbosacral area.  SKIN:  Without bruising or lesions.  There is no petechial rash.  Several tattoos.  NEUROLOGIC:  Nonfocal.   LABORATORY DATA:  Hemoglobin 9.6, hematocrit 28.2, white count 5.6,  platelets 96, neutrophils 3.6.  LDH 236.  MCV 83.8.  Sed rate 95.  TSH  0.633.  Iron 68, TIBC 201, percentage saturation 34, ferritin 719, B12  632, folic acid 19.5.  Calcium 10.1, total protein 7.3, albumin 3.7, AST  20, ALP 16, total bilirubin 0.8, alkaline phosphatase 82, sodium 144,  potassium 4.7, BUN 60, creatinine 6.18.  ANA negative.  Hepatitis panel  negative.  Smear shows tear drops, elliptocytes and large platelets.  Urine creatinine 40.9.  Urinary culture shows E. coli positive.  ASO  less than 2.5, C4 45, C3 168.   ASSESSMENT AND PLAN:  Dr. Clelia Croft has seen and evaluated the patient and  the chart has been reviewed.  The patient is a 59 year old lady with  mostly light chain myeloma with acute on chronic renal failure, lytic  lesions and anemia.  The urine immunofixation is positive for kappa  light chains.   RECOMMENDATIONS:  1. Check serum light chain.  2. Check beta-2-microglobulin.  3. The patient will need 24-hour urine collection, can be checked once      the creatinine is stable.  4. Will defer bone marrow for now; it will be technically difficult,      and will not change the treatment at this point.  5. We will start a pulse dose Decadron 40 mg p.o. for 4 days in      attempt to decrease the light chain production,      and may help to improve the renal function.  6. Arrange followup appointment for treatment.   Thank you very much for allowing [place] the opportunity to participate in  the care of Ms. Scorza.      Marlowe Kays, P.A.      Blenda Nicely. [facility]  Electronically Signed    SW/MEDQ  D:  12/10/2006  T:  12/10/2006  Job:  [id number]

## 2010-11-06 NOTE — Discharge Summary (Signed)
NAMEMORGHAN, [name]            ACCOUNT NO.:  [id number]   MEDICAL RECORD NO.:  [id number]          PATIENT TYPE:  INP   LOCATION:  1340                         FACILITY:  [facility]   PHYSICIAN:  Elliot Cousin, M.D.    DATE OF BIRTH:  02-24-1951   DATE OF ADMISSION:  11/28/2006  DATE OF DISCHARGE:  12/08/2006                               DISCHARGE SUMMARY   ADDENDUM:  PLEASE SEE THE PREVIOUS DISCHARGE SUMMARY DICTATED BY DR. Sherrie Mustache ON December 07, 2006.  THIS IS AN ADDENDUM.   HOSPITAL COURSE:  Additional lab results are as follows:  Beta-2  microglobulin 9.34 (1-1.73 within normal limits), kappa quantitative  free light chains 1,070, C-ANCA less than 1-20, serum protein  electrophoresis with 2 restricted bands consistent with monoclonal  proteins present.   Dr. Clelia Croft re-evaluated the patient today, prior to hospital discharge.  He felt that the patient was relatively stable and somewhat improved  over the past few days on Decadron treatment.  He recommended that the  patient follow up with him in approximately one to two weeks.  He will  arrange the followup.  The patient was informed.   Dr. Lowell Guitar, nephrologist, provided the followup with regards to the  patient's acute renal failure.  He advised the patient to follow up with  [facility] in one to two weeks.  He will also arrange  the patient's followup.  The patient was informed.   Home health services have been arranged for the patient including home  health physical therapy, occupational therapy, and a home health aid.  In addition, a rolling walker and a 3 in 1 tub bench have been ordered.   For pain management, the patient will remain on OxyContin 20 mg q.12  hours and p.r.n. Vicodin for breakthrough pain.  The patient's capillary  blood glucose has been relatively stable and is currently within normal  limits, without the Decadron therapy.  Therefore, the patient was  advised to treat her diabetes with  diet alone at this time.  She was  advised to discontinue all of her other medications that she had been  taking prior to the hospital admission.  The patient stated that she  understood.   DISCHARGE MEDICATIONS:  1. OxyContin 20 mg every 12 hours.  2. Vicodin 5 mg every 4 hours as needed for breakthrough pain.  3. MiraLax laxative 17 gm added to 8 ounces of beverage daily.  4. Senokot S, one to two tablets as needed.  5. Phenergan 25 mg every 4 hours as needed for nausea.      Elliot Cousin, M.D.  Electronically Signed     DF/MEDQ  D:  12/08/2006  T:  12/08/2006  Job:  [id number]   cc:   Blenda Nicely. Clelia Croft   Terrial Rhodes, M.D.  Fax: [phone]

## 2010-11-06 NOTE — H&P (Signed)
[name], [name]:  [id number]   MEDICAL RECORD NO.:  [id number]          PATIENT TYPE:  INP   LOCATION:  1442                         FACILITY:  [facility]   PHYSICIAN:  Gardiner Barefoot, MD    DATE OF BIRTH:  1950-12-31   DATE OF ADMISSION:  11/28/2006  DATE OF DISCHARGE:                              HISTORY & PHYSICAL   PRIMARY CARE PHYSICIAN:  [place] [facility], Dr.  Rosary Lively, Resident Physician.   HISTORY OF PRESENT ILLNESS:  [name] is a 60 year old female with  past medical history including hypertension, hyperlipidemia, diabetes,  GERD, who presents here with a vague history of nausea and vomiting and  several episodes of loose bowel movements over the last few days. The  patient reports that she has had no sick contacts and just has had poor  p.o. She reports no fever, bloody emesis, or bloody diarrhea. Per the  patient, she has no history of any renal failure and is unaware of any  significant anemia she has had since she was treated for excessive  menstrual bleeding several years ago. She no longer menstruates.   PAST MEDICAL HISTORY:  1. Hypertension.  2. Diabetes.  3. GERD.  4. Status post annulectomy.  5. Status post gastric banding.  6. Status post breast reduction surgery.  7. Depression.  8. Chronic back pain.   MEDICATIONS:  1. Wellbutrin.  2. Lipitor.  3. Glucophage.  4. Hydrochlorothiazide.  5. Prilosec.  6. Percocet.   ALLERGIES:  MORPHINE (causes hives.)   SOCIAL HISTORY:  The patient lives in [place] and is not working at  this time.   FAMILY HISTORY:  The patient does not report any known history of  diabetes in her family.   REVIEW OF SYSTEMS:  Complete 12 point review of systems was obtained and  was negative other than as presented in the history of present illness.   PHYSICAL EXAMINATION:  VITAL SIGNS:  Temperature 98.3, pulse 112,  respiratory rate 20, blood pressure  161/104.  GENERAL:  Awake, alert and oriented times three and appears in no acute  distress.  HEENT:  Anicteric.  CARDIOVASCULAR:  Tachycardiac to greater than 120, regular rhythm and no  murmur, rub, or gallop noted.  LUNGS:  Clear to auscultation bilaterally.  ABDOMEN:  Morbidly obese. Nontender, nondistended, and positive bowel  sounds. No hepatosplenomegaly. Well healed surgical scar.  EXTREMITIES:  No clubbing, cyanosis, or edema.  SKIN:  No rashes.   LABORATORY DATA:  Sodium 139, potassium 4.9, chloride 106, bicarb 22,  BUN 82, creatinine 8.26, glucose 88. WBC is 5.6. Hemoglobin 8.7 with an  MCV of 82, platelets 128,000.   ASSESSMENT:  A 60 year old female with acute renal failure, tachycardia,  and anemia. Per discussion with the patient, she is unaware of having  any of these problems previously.   PLAN:  1. Acute renal failure. The patient is urinating well. I doubt this is      an obstructive process and her BUN and creatinine ratio is about 10      to 1, which does suggest that it  is not a pre-renal azotemia. She      has had just a few episodes of vomiting over the last several days,      it is possible that it may be an intrinsic renal disease.      Therefore, will continue hydration of the patient and check a renal      ultrasound, as well as a urinalysis and culture. Hold her Metformin      as well as her Lisinopril. Additionally, will watch her closely for      lactic acidosis and will check her lactic acid at this time,      although she does not have any acidemia with a normal bicarb on her      labs today. I doubt this is an issue at this time.  2. Tachycardia. Will check an EKG and have the patient monitored      overnight, as well as do serial enzymes, as her blood pressure is      elevated. Will give her a dose of Metoprolol p.o. at this time.  3. Anemia. This is normocytic anemia, although does bring up the      question of iron deficiency, as it is low on  the low end of the      normocytic anemia. Will check iron studies as well as watch her      hemoglobin closely.      Gardiner Barefoot, MD  Electronically Signed     RWC/MEDQ  D:  11/28/2006  T:  11/29/2006  Job:  [id number]

## 2010-11-09 NOTE — Op Note (Signed)
NAME:  [name], [name]                      ACCOUNT NO.:  [id number]   MEDICAL RECORD NO.:  [id number]                   PATIENT TYPE:  AMB   LOCATION:  DAY                                  FACILITY[facility]  [facility]   PHYSICIAN:  Katherine Roan, M.D.               DATE OF BIRTH:  [birth year]   DATE OF PROCEDURE:  02/08/2004  DATE OF DISCHARGE:                                 OPERATIVE REPORT   PREOPERATIVE DIAGNOSIS:  Persistent abnormal bleeding.   POSTOPERATIVE DIAGNOSIS:  Persistent abnormal bleeding.   OPERATION:  1. Pelvic exam under anesthesia.  2. Cervical dilatation.  3. Hysteroscopy with resection of endometrial nodules and curettage.   The patient was placed in lithotomy position, prepped and draped in the  usual fashion.  General endotracheal anesthesia was instituted.  There was  marked prolapse so that the cervix was at the introitus.  Exam revealed a  midplane uterus, maybe slightly retroverted, that appeared to be slightly  enlarged.  After the cervix was dilated and hysteroscope was inserted,  resection of the endometrial nodules were accomplished.  There was one  nodule posteriorly that was resected.  It looked like a small polyp.  No  unusual blood loss occurred.  At the termination of the procedure, we  injected the uterus with Pitressin.  The uterus sounds to 4 inches.  All the  resected tissue and curetted tissue was sent together as one specimen.                                               Katherine Roan, M.D.    SDM/MEDQ  D:  02/08/2004  T:  02/08/2004  Job:  [id number]

## 2010-11-12 ENCOUNTER — Inpatient Hospital Stay (HOSPITAL_COMMUNITY)
Admission: EM | Admit: 2010-11-12 | Discharge: 2010-12-03 | DRG: 336 | Disposition: A | Discharge status: Home / Self Care | Payer: Medicaid Other | Attending: Surgery | Admitting: Surgery

## 2010-11-12 ENCOUNTER — Emergency Department (HOSPITAL_COMMUNITY): Payer: Medicaid Other

## 2010-11-12 ENCOUNTER — Encounter (HOSPITAL_COMMUNITY): Payer: Self-pay

## 2010-11-12 DIAGNOSIS — K430 Incisional hernia with obstruction, without gangrene: Principal | ICD-10-CM | POA: Diagnosis present

## 2010-11-12 DIAGNOSIS — E870 Hyperosmolality and hypernatremia: Secondary | ICD-10-CM | POA: Diagnosis present

## 2010-11-12 DIAGNOSIS — K660 Peritoneal adhesions (postprocedural) (postinfection): Secondary | ICD-10-CM | POA: Diagnosis present

## 2010-11-12 DIAGNOSIS — K560 Paralytic ileus: Secondary | ICD-10-CM | POA: Diagnosis not present

## 2010-11-12 DIAGNOSIS — R112 Nausea with vomiting, unspecified: Secondary | ICD-10-CM | POA: Diagnosis present

## 2010-11-12 DIAGNOSIS — N179 Acute kidney failure, unspecified: Secondary | ICD-10-CM | POA: Diagnosis present

## 2010-11-12 DIAGNOSIS — K929 Disease of digestive system, unspecified: Secondary | ICD-10-CM | POA: Diagnosis not present

## 2010-11-12 DIAGNOSIS — D72819 Decreased white blood cell count, unspecified: Secondary | ICD-10-CM | POA: Diagnosis present

## 2010-11-12 DIAGNOSIS — E038 Other specified hypothyroidism: Secondary | ICD-10-CM | POA: Diagnosis present

## 2010-11-12 DIAGNOSIS — I129 Hypertensive chronic kidney disease with stage 1 through stage 4 chronic kidney disease, or unspecified chronic kidney disease: Secondary | ICD-10-CM | POA: Diagnosis present

## 2010-11-12 DIAGNOSIS — N189 Chronic kidney disease, unspecified: Secondary | ICD-10-CM | POA: Diagnosis present

## 2010-11-12 DIAGNOSIS — E785 Hyperlipidemia, unspecified: Secondary | ICD-10-CM | POA: Diagnosis present

## 2010-11-12 DIAGNOSIS — E46 Unspecified protein-calorie malnutrition: Secondary | ICD-10-CM | POA: Diagnosis not present

## 2010-11-12 DIAGNOSIS — E119 Type 2 diabetes mellitus without complications: Secondary | ICD-10-CM | POA: Diagnosis present

## 2010-11-12 DIAGNOSIS — Y838 Other surgical procedures as the cause of abnormal reaction of the patient, or of later complication, without mention of misadventure at the time of the procedure: Secondary | ICD-10-CM | POA: Diagnosis not present

## 2010-11-12 DIAGNOSIS — C9000 Multiple myeloma not having achieved remission: Secondary | ICD-10-CM | POA: Diagnosis present

## 2010-11-12 LAB — COMPREHENSIVE METABOLIC PANEL
Albumin: 3.7 g/dL (ref 3.5–5.2)
BUN: 32 mg/dL — ABNORMAL HIGH (ref 6–23)
Calcium: 10 mg/dL (ref 8.4–10.5)
Glucose, Bld: 128 mg/dL — ABNORMAL HIGH (ref 70–99)
Potassium: 3.6 mEq/L (ref 3.5–5.1)
Total Protein: 7.2 g/dL (ref 6.0–8.3)

## 2010-11-12 LAB — GLUCOSE, CAPILLARY
Glucose-Capillary: 118 mg/dL — ABNORMAL HIGH (ref 70–99)
Glucose-Capillary: 95 mg/dL (ref 70–99)

## 2010-11-12 LAB — PROTIME-INR: INR: 1.05 (ref 0.00–1.49)

## 2010-11-12 LAB — DIFFERENTIAL
Basophils Absolute: 0 10*3/uL (ref 0.0–0.1)
Eosinophils Relative: 0 % (ref 0–5)
Lymphocytes Relative: 25 % (ref 12–46)
Lymphs Abs: 0.6 10*3/uL — ABNORMAL LOW (ref 0.7–4.0)
Monocytes Absolute: 0.8 10*3/uL (ref 0.1–1.0)
Neutro Abs: 1.1 10*3/uL — ABNORMAL LOW (ref 1.7–7.7)

## 2010-11-12 LAB — CBC
HCT: 43.1 % (ref 36.0–46.0)
Hemoglobin: 14.5 g/dL (ref 12.0–15.0)
MCHC: 33.6 g/dL (ref 30.0–36.0)
MCV: 81 fL (ref 78.0–100.0)
RDW: 19.2 % — ABNORMAL HIGH (ref 11.5–15.5)
WBC: 2.5 10*3/uL — ABNORMAL LOW (ref 4.0–10.5)

## 2010-11-12 LAB — POCT CARDIAC MARKERS
CKMB, poc: 2.6 ng/mL (ref 1.0–8.0)
Troponin i, poc: <0.05 ng/mL (ref 0.00–0.09)

## 2010-11-12 LAB — LIPASE, BLOOD: Lipase: 68 U/L — ABNORMAL HIGH (ref 11–59)

## 2010-11-12 LAB — APTT: aPTT: 31 seconds (ref 24–37)

## 2010-11-13 ENCOUNTER — Inpatient Hospital Stay (HOSPITAL_COMMUNITY): Payer: Medicaid Other

## 2010-11-13 DIAGNOSIS — C9000 Multiple myeloma not having achieved remission: Secondary | ICD-10-CM

## 2010-11-13 DIAGNOSIS — N179 Acute kidney failure, unspecified: Secondary | ICD-10-CM

## 2010-11-13 LAB — GLUCOSE, CAPILLARY
Glucose-Capillary: 102 mg/dL — ABNORMAL HIGH (ref 70–99)
Glucose-Capillary: 91 mg/dL (ref 70–99)

## 2010-11-13 LAB — CBC
MCV: 81.7 fL (ref 78.0–100.0)
Platelets: 165 10*3/uL (ref 150–400)
RBC: 4.64 MIL/uL (ref 3.87–5.11)
RDW: 19.4 % — ABNORMAL HIGH (ref 11.5–15.5)
WBC: 3.3 10*3/uL — ABNORMAL LOW (ref 4.0–10.5)

## 2010-11-13 LAB — TSH: TSH: 0.173 u[IU]/mL — ABNORMAL LOW (ref 0.350–4.500)

## 2010-11-13 LAB — BASIC METABOLIC PANEL
BUN: 32 mg/dL — ABNORMAL HIGH (ref 6–23)
Chloride: 103 mEq/L (ref 96–112)
GFR calc Af Amer: 37 mL/min — ABNORMAL LOW (ref >60)
GFR calc non Af Amer: 31 mL/min — ABNORMAL LOW (ref >60)
Potassium: 3.8 mEq/L (ref 3.5–5.1)
Sodium: 143 mEq/L (ref 135–145)

## 2010-11-14 ENCOUNTER — Inpatient Hospital Stay (HOSPITAL_COMMUNITY): Payer: Medicaid Other

## 2010-11-14 LAB — CBC
Hemoglobin: 12.2 g/dL (ref 12.0–15.0)
MCH: 26.8 pg (ref 26.0–34.0)
Platelets: 159 10*3/uL (ref 150–400)
RBC: 4.56 MIL/uL (ref 3.87–5.11)
WBC: 3.2 10*3/uL — ABNORMAL LOW (ref 4.0–10.5)

## 2010-11-14 LAB — BASIC METABOLIC PANEL
CO2: 26 mEq/L (ref 19–32)
Chloride: 112 mEq/L (ref 96–112)
Creatinine, Ser: 1.19 mg/dL (ref 0.4–1.2)
GFR calc Af Amer: 56 mL/min — ABNORMAL LOW (ref >60)
Potassium: 4.3 mEq/L (ref 3.5–5.1)

## 2010-11-14 NOTE — H&P (Signed)
NAMETALLI, KIMMER NO.:  [id number]  MEDICAL RECORD NO.:  [id number]           PATIENT TYPE:  E  LOCATION:  WLED                         FACILITY:  [facility]  PHYSICIAN:  Currie Paris, M.D.DATE OF BIRTH:  January 17, 1951  DATE OF ADMISSION:  11/12/2010 DATE OF DISCHARGE:                             HISTORY & PHYSICAL   TIME OF ADMISSION:  08:45 a.m.  ADMITTING PHYSICIAN:  Currie Paris, M.D.  REQUESTING PHYSICIAN:  Dr. Bruce Donath in the emergency department.  ONCOLOGIST:  Benjiman Core, M.D.  CONSULTING HOSPITALIST:  Marcellus Scott, MD  CHIEF COMPLAINT:  Nausea and vomiting.  HISTORY OF PRESENT ILLNESS:  Ms. Farha is a 60 year old black female with chronic renal insufficiency, hypertension, diabetes mellitus, hypercholesterolemia, multiple myeloma and morbid obesity who began having nausea and vomiting this past Thursday.  She denied any abdominal pain with this.  She states her last bowel movement was on Wednesday and has had minimal flatus since then.  Due to continued nausea and vomiting, the patient presented to the emergency department today.  Upon arrival, she had a CT scan which revealed a small bowel obstruction with transition point in the mid to distal ileum.  There was evidence of transverse colon and a hernia, however, there was no evidence of strangulation.  Because of these findings, we have been asked to evaluate the patient for admission.  REVIEW OF SYSTEMS:  Please see HPI, otherwise, currently all other systems have been reviewed and are negative.  PAST MEDICAL HISTORY: 1. Multiple myeloma. 2. Morbid obesity. 3. Hypertension. 4. Diabetes mellitus. 5. Hypercholesterolemia. 6. Chronic renal insufficiency.  PAST SURGICAL HISTORY: 1. Panniculectomy 2. Partial hysterectomy, suspect just the one ovary per patient. 3. Right knee surgery. 4. Breast reduction.  FAMILY HISTORY:  Noncontributory.  SOCIAL HISTORY:  The  patient's husband lives in a skilled nursing facility.  She is otherwise disabled.  She denies any alcohol, tobacco or illicit drug abuse.  MEDICATIONS:  Have not been confirmed yet.  The patient is unable to give a full list of medications.  Per the emergency department records, it states she is on  amlodipine, aspirin, atenolol, bupropion, Claritin, Coumadin which she definitely says she does not take any more, Detrol, Lasix, lisinopril, omeprazole, oxybutynin, chloride, pravastatin, ranitidine, and simvastatin.  ALLERGIES:  MORPHINE.  PHYSICAL EXAMINATION:  GENERAL:  Ms. Lebon is a 60 year old morbidly obese black female who is currently lying in bed, in no acute distress. VITAL SIGNS:  Temperature 98.5, pulse 104, blood pressure 124/54, respirations 20. HEENT:  Head is normocephalic, atraumatic.  Sclerae noninjected.  Pupils are equal, round and reactive to light.  Ears and nose without any obvious masses or lesions.  No rhinorrhea.  Mouth is pink.  Throat shows no exudate.  The patient does have both eyelashes present. HEART:  Regular rate and rhythm.  Normal S1, S2.  A small probable 2 out of 6 murmur is noted but otherwise no gallops or rubs are noted.  She does have palpable carotid, radial and pedal pulses bilaterally. LUNGS:  Clear to auscultation bilaterally with no wheezes, rhonchi or rales noted.  Respiratory effort is  nonlabored. CHEST:  Reveals Port-A-Cath on the right chest wall. ABDOMEN:  Soft and nontender.  She is otherwise obese.  She does have hypoactive bowel sounds.  Just left of the lower midline, she does have palpable hernia, however, due to her significant obesity, it is very difficult to reduce this or fill her defect.  Otherwise no other masses or organomegaly are noted. MUSCULOSKELETAL:  All 4 extremities are symmetrical with no cyanosis, clubbing or edema. NEURO:  Cranial nerves II through XII appear to be grossly intact.  Deep tendon reflex exam is  deferred at this time. PSYCH:  The patient is alert and oriented x3 with an appropriate affect.  LABORATORY DATA AND DIAGNOSTICS:  White blood cell count 2500, hemoglobin 14.5, hematocrit 43.1, platelet count is 171,000.  Sodium 137, potassium 3.6, glucose 128, BUN 32, creatinine 2.04, lipase 68. Total bilirubin 0.6, alkaline phosphatase 88, AST 26, ALT 21, INR is 1.05.  Diagnostics:  CT scan of the abdomen and pelvis revealed a small bowel obstruction with bowel dilatation of 5.2 cm.  There is a transition zone in the mid to distal ileum.  She also does have transverse colon and the hernia that is incarcerated but not strangulated.  IMPRESSION: 1. Small bowel obstruction. 2. Multiple myeloma. 3. Leukopenia. 4. Diabetes mellitus. 5. Hypertension. 6. Cholesterolemia 7. Morbid obesity. 8. Chronic renal insufficiency.  PLAN:  I have discussed this patient with Dr. Waymon Amato with the hospitalist service.  We will plan on admitting this patient and asked him to consult for further management of medical problems.  In the meantime, we would recommend an NG tube placement.  However, currently the patient is refusing this.  If the patient continues to refuse, we will continue on n.p.o. status with bowel rest and conservative management.  The hopes will be that the patient would improve without surgical intervention; however, if she does not, she may require surgical intervention.  We will repeat a 2-view abdominal x-ray in the morning to follow up on her bowel obstruction.  We will also start the patient on heparin 5000 units subcu q.8 h for DVT prophylaxis as well as SCDs while in bed.  No antibiotics will be started at this time as they are currently not warranted.     Letha Cape, PA   ______________________________ Currie Paris, M.D.    KEO/MEDQ  D:  11/12/2010  T:  11/12/2010  Job:  [id number]  cc:   Benjiman Core, M.D. Fax: [phone]  Marcellus Scott,  MD  Electronically Signed by Barnetta Chapel PA on 11/12/2010 04:22:41 PM Electronically Signed by Cyndia Bent M.D. on 11/14/2010 06:13:21 PM

## 2010-11-15 ENCOUNTER — Inpatient Hospital Stay (HOSPITAL_COMMUNITY): Payer: Medicaid Other

## 2010-11-15 LAB — GLUCOSE, CAPILLARY
Glucose-Capillary: 64 mg/dL — ABNORMAL LOW (ref 70–99)
Glucose-Capillary: 93 mg/dL (ref 70–99)
Glucose-Capillary: 82 mg/dL (ref 70–99)

## 2010-11-15 LAB — T4, FREE: Free T4: 1.07 ng/dL (ref 0.80–1.80)

## 2010-11-15 LAB — T3, FREE: T3, Free: 2.2 pg/mL — ABNORMAL LOW (ref 2.3–4.2)

## 2010-11-15 LAB — TSH: TSH: 0.185 u[IU]/mL — ABNORMAL LOW (ref 0.350–4.500)

## 2010-11-16 ENCOUNTER — Inpatient Hospital Stay (HOSPITAL_COMMUNITY): Payer: Medicaid Other

## 2010-11-16 LAB — BASIC METABOLIC PANEL
BUN: 15 mg/dL (ref 6–23)
CO2: 23 mEq/L (ref 19–32)
Calcium: 8.3 mg/dL — ABNORMAL LOW (ref 8.4–10.5)
Creatinine, Ser: 0.98 mg/dL (ref 0.4–1.2)
GFR calc non Af Amer: 58 mL/min — ABNORMAL LOW (ref >60)
Glucose, Bld: 104 mg/dL — ABNORMAL HIGH (ref 70–99)

## 2010-11-16 LAB — GLUCOSE, CAPILLARY: Glucose-Capillary: 114 mg/dL — ABNORMAL HIGH (ref 70–99)

## 2010-11-17 LAB — CBC
HCT: 39.7 % (ref 36.0–46.0)
Hemoglobin: 13.1 g/dL (ref 12.0–15.0)
MCHC: 33 g/dL (ref 30.0–36.0)
MCV: 83.2 fL (ref 78.0–100.0)
RDW: 20.2 % — ABNORMAL HIGH (ref 11.5–15.5)

## 2010-11-17 LAB — GLUCOSE, CAPILLARY
Glucose-Capillary: 67 mg/dL — ABNORMAL LOW (ref 70–99)
Glucose-Capillary: 97 mg/dL (ref 70–99)

## 2010-11-17 LAB — BASIC METABOLIC PANEL
BUN: 12 mg/dL (ref 6–23)
CO2: 23 mEq/L (ref 19–32)
Calcium: 8.1 mg/dL — ABNORMAL LOW (ref 8.4–10.5)
Glucose, Bld: 143 mg/dL — ABNORMAL HIGH (ref 70–99)
Sodium: 144 mEq/L (ref 135–145)

## 2010-11-18 LAB — GLUCOSE, CAPILLARY

## 2010-11-19 LAB — BASIC METABOLIC PANEL
CO2: 19 mEq/L (ref 19–32)
Chloride: 112 mEq/L (ref 96–112)
GFR calc Af Amer: 57 mL/min — ABNORMAL LOW (ref >60)
Glucose, Bld: 110 mg/dL — ABNORMAL HIGH (ref 70–99)
Sodium: 142 mEq/L (ref 135–145)

## 2010-11-19 LAB — GLUCOSE, CAPILLARY

## 2010-11-20 ENCOUNTER — Inpatient Hospital Stay (HOSPITAL_COMMUNITY): Payer: Medicaid Other

## 2010-11-20 LAB — GLUCOSE, CAPILLARY: Glucose-Capillary: 103 mg/dL — ABNORMAL HIGH (ref 70–99)

## 2010-11-20 NOTE — Consult Note (Signed)
[name], [name]:  [id number]  MEDICAL RECORD NO.:  [id number]           PATIENT TYPE:  I  LOCATION:  1523                         FACILITY:  [facility]  PHYSICIAN:  Marcellus Scott, MD     DATE OF BIRTH:  12-13-1950  DATE OF CONSULTATION: DATE OF DISCHARGE:                                CONSULTATION   PRIMARY CARE PHYSICIAN:  Dr. Clelia Croft at [facility].  ONCOLOGIST:  Benjiman Core, M.D.  NEPHROLOGIST:  Terrial Rhodes, M.D.  MD REQUESTING CONSULTATION:  Currie Paris, M.D.  REASON FOR CONSULTATION:  Evaluation and management of medical conditions such as diabetes, hypertension.  CHIEF COMPLAINT:  Nausea and vomiting.  HISTORY OF PRESENTING ILLNESS:  [name] is a pleasant 60 year old African-American female patient with history of type 2 diabetes mellitus, diet controlled; hypertension; hyperlipidemia; chronic kidney disease; multiple myeloma; previous left lower extremity DVT, status post Coumadin, who indicates that she has had problems with nausea, vomiting and abdominal discomfort for 2-3 months; however, since 3 days, patient has noted intractable nausea and vomiting, but no coffee grounds or blood.  Her last BM was 3 days ago and she says she has not had passed any flatus since.  She denies abdominal pain, but does give history of abdominal distention.  There was no fevers or chills.  For these symptoms, patient presented to the Emergency Room where her CT scan was suggestive of small bowel obstruction.  Three attempts to pass an NG tube were unsuccessful.  Finally, an NG tube was passed on the surgical floor and 2 liters of fluid returned immediately after passing the NG tube.  Patient indicates that she feels significantly better with improvement in her abdominal distention and nausea.  She continues to decline and denies any abdominal pain.  She is admitted by the surgical team and the triad hospitalist were requested  to provide assistance with management of her diabetes, hypertension and other medical problems.  PAST MEDICAL HISTORY: 1. Hypertension. 2. Hyperlipidemia. 3. Type 2 diabetes mellitus, which she says has been diet-controlled     for an excess of 2 years.  She has stopped metformin for that     period of time. 4. History of left lower extremity DVT and says that she has been off     Coumadin for 8 months.  There is no history of coronary artery     disease or congestive heart failure. 5. No history of CVA or TIA. 6. No history of COPD or asthma. 7. Patient has no history of obstructive sleep apnea.  PAST SURGICAL HISTORY: 1. Panniculectomy. 2. Surgery for ovarian cyst. 3. Right total knee replacement. 4. Bilateral breast reduction surgery.  ALLERGIES:  MORPHINE causes tongue swelling.  MEDICATIONS:  Yet to be reconciled, but may include: 1. Lisinopril. 2. Pravastatin. 3. Atenolol. 4. Amlodipine. 5. Claritin. 6. One a day vitamin. 7. Phenergan. 8. Detrol LA. 9. Revlimid. 10.Aspirin. 11.Ranitidine. 12.Orasol gel.  FAMILY HISTORY:  Positive for breast cancer in her sister.  SOCIAL HISTORY:  Patient is married and her husband is in a skilled nursing facility.  She used to work doing housekeeping in  hotels.  She is currently on disability.  She quit smoking 25 years ago.  There is no history of alcohol or drug abuse.  ADVANCE DIRECTIVES:  Full code.  REVIEW OF SYSTEMS:  All systems reviewed and apart from history of presenting illness is negative.  There is no history of asymmetric leg swelling or long distance travel.  She says she was on Lasix for leg edema from her Revlimid.  She denies any dyspnea or chest pain or dyspnea on exertion.  PHYSICAL EXAMINATION:  GENERAL:  [name] is a morbidly obese, moderately built female patient, who is in no obvious distress. VITAL SIGNS:  Temperature is 98 degrees Fahrenheit, pulse 103 per minute and regular, respirations 22  per minute, blood pressure 156/102 mmHg and saturating at 98% on room air. HEAD, EYES, ENT:  Nontraumatic, normocephalic.  Pupils equally reacting to light and accommodation.  Oral mucosa mildly dry.  Patient has a nasogastric tube that is connected to an intermittent low wall suction. Patient has a wig. NECK:  Supple.  No JVD or carotid bruit. LYMPHATICS:  No lymphadenopathy. RESPIRATORY SYSTEM:  Slightly decreased breath sounds in the bases, but otherwise clear to auscultation and distant breath sounds.  No increased work of breathing. CARDIOVASCULAR SYSTEM:  First and second heart sounds heard.  Regular tachycardia.  No murmurs, rubs, gallops or clicks.  No JVD. ABDOMEN:  Mildly distended, nontender, soft.  Bowel sounds are decreased.  No organomegaly or mass appreciated. CENTRAL NERVOUS SYSTEM:  Patient is awake, alert, oriented x3 with no focal neurological deficits. EXTREMITIES:  With trace bilateral lower extremity edema, but otherwise grade 5/5 power. SKIN:  Without any rashes. MUSCULOSKELETAL SYSTEM:  Negative.  LABORATORY DATA:  Lipase is 68.  Comprehensive metabolic panel significant for chloride 92, glucose 128, BUN 32, creatinine 2.04 and normal LFTs.  PTT is 31.  Point of care cardiac markers is unremarkable. INR is 1.05.  CBC:  Hemoglobin 14.5, hematocrit 43.1, white blood cell 2.5, platelets 171,000 with neutrophils 43%, lymphocytes 25% and monocytes 31%.  Absolute neutrophil count is 1100.  DIAGNOSTIC STUDIES:    1. CT of the abdomen and pelvis without contrast. Impression: A.  Diffuse small bowel dilatation to 5.2 cm in diameter with the transition point at the right lower quadrant in the mid to distal ileum most likely reflecting in addition.  Findings compatible with small- bowel obstruction.  Relative decompression of the colon noted.  No free intra-abdominal air seen. B.  Transverse colon extends into the anterior abdominal wall hernia with evidence of  incarceration with no evidence of strangulation. Additional small or anterior bowel wall hernia is noted containing only fat. C.  Small calcified fibroid uterus. D.  Mildly atrophic kidneys with scattered cyst. E.  Scattered calcification along the abdominal aorta and its branches. F.  Diffuse degenerative change along the lower thoracic and lumbar spine with marked compression deformity of vertebral body L2.  2. Abdominal x-ray shows impression: A.  Dilatation of small bowel loops to 6.7 cm with scattered air-fluid levels, concerning for small-bowel obstruction; however, residual air and stool are noted within the colon.  No free intra-abdominal air is seen. B.  Mild bibasilar opacities may reflect atelectasis or possible mild pneumonia.  Hypo-expanded lungs. C.  Degenerative change along the thoracic and lumbar spine and at the sacroiliac joints.  EKG shows sinus tachycardia at 103 beats per minute with normal axis. Question Q-waves inferior leads only seen predominantly in lead III, otherwise no acute changes.  ASSESSMENT  AND PLAN: 1. Small-bowel obstruction:  Possibly secondary to adhesions.     Management is per the primary service.  Patient currently being     managed conservatively by bowel rest and decompression with     nasogastric tube.  If surgery is planned electively then she is at     low risk (less than 3%) for perioperative cardiovascular event and     may proceed with surgery without any further cardiovascular workup. 2. Hypertension:  While patient is nil per oral, we will treat with     intravenous metoprolol and p.r.n. intravenous hydralazine. 3. Type 2 diabetes mellitus:  Apparently, patient has been diet     controlled.  We will check hemoglobin A1c and CBGs and place on     sliding scale insulin. 4. Acute on chronic kidney disease versus chronic kidney disease at     baseline:  Patient is on intravenous fluids and will follow-up her     daily basic  metabolic panel. 5. Morbid obesity. 6. Hyperlipidemia. 7. History of left lower extremity deep venous thrombosis, status post     Coumadin:  Patient has been placed on deep venous thrombosis     prophylaxis with heparin by the primary service. 8. Multiple myeloma:  We will alert the primary oncologist of     patient's admission. 9. Patient is full code status.  Time taken in coordinating this consultation was 60 minutes.     Marcellus Scott, MD     AH/MEDQ  D:  11/12/2010  T:  11/12/2010  Job:  [id number]  cc:   Dr. Teofilo Pod, M.D. Fax: [phone]  Currie Paris, M.D. [address], [address] [place] [address]  Terrial Rhodes, M.D. Fax: [phone]  Electronically Signed by Marcellus Scott MD on 11/20/2010 02:05:02 AM

## 2010-11-21 ENCOUNTER — Inpatient Hospital Stay (HOSPITAL_COMMUNITY): Payer: Medicaid Other

## 2010-11-21 ENCOUNTER — Other Ambulatory Visit: Payer: Self-pay | Admitting: General Surgery

## 2010-11-21 LAB — SURGICAL PCR SCREEN
MRSA, PCR: NEGATIVE
Staphylococcus aureus: NEGATIVE

## 2010-11-21 LAB — GLUCOSE, CAPILLARY: Glucose-Capillary: 82 mg/dL (ref 70–99)

## 2010-11-21 MED ORDER — IOHEXOL 300 MG/ML  SOLN
100.0000 mL | Freq: Once | INTRAMUSCULAR | Status: AC | PRN
  Administered 2010-11-21: 100 mL via INTRAVENOUS

## 2010-11-22 ENCOUNTER — Inpatient Hospital Stay (HOSPITAL_COMMUNITY): Payer: Medicaid Other

## 2010-11-22 LAB — GLUCOSE, CAPILLARY
Glucose-Capillary: 126 mg/dL — ABNORMAL HIGH (ref 70–99)
Glucose-Capillary: 140 mg/dL — ABNORMAL HIGH (ref 70–99)
Glucose-Capillary: 120 mg/dL — ABNORMAL HIGH (ref 70–99)
Glucose-Capillary: 93 mg/dL (ref 70–99)

## 2010-11-22 LAB — BASIC METABOLIC PANEL
BUN: 9 mg/dL (ref 6–23)
CO2: 26 mEq/L (ref 19–32)
Chloride: 113 mEq/L — ABNORMAL HIGH (ref 96–112)
Chloride: 108 mEq/L (ref 96–112)
Creatinine, Ser: 1.2 mg/dL (ref 0.4–1.2)
GFR calc Af Amer: 55 mL/min — ABNORMAL LOW (ref >60)
Glucose, Bld: 480 mg/dL — ABNORMAL HIGH (ref 70–99)
Potassium: 6.1 mEq/L — ABNORMAL HIGH (ref 3.5–5.1)
Potassium: 4.4 mEq/L (ref 3.5–5.1)
Sodium: 140 mEq/L (ref 135–145)

## 2010-11-22 LAB — CBC
HCT: 35.4 % — ABNORMAL LOW (ref 36.0–46.0)
Hemoglobin: 12.4 g/dL (ref 12.0–15.0)
MCH: 27 pg (ref 26.0–34.0)
MCV: 81.9 fL (ref 78.0–100.0)
MCV: 82.4 fL (ref 78.0–100.0)
RBC: 4.32 MIL/uL (ref 3.87–5.11)
RBC: 4.59 MIL/uL (ref 3.87–5.11)
WBC: 4.9 10*3/uL (ref 4.0–10.5)
WBC: 5.9 10*3/uL (ref 4.0–10.5)

## 2010-11-22 NOTE — Op Note (Signed)
NAMEARTESIA, BERKEY            ACCOUNT NO.:  [id number]  MEDICAL RECORD NO.:  [id number]           PATIENT TYPE:  I  LOCATION:  1523                         FACILITY:  [facility]  PHYSICIAN:  Adolph Pollack, M.D.DATE OF BIRTH:  06/07/1951  DATE OF PROCEDURE:  11/21/2010 DATE OF DISCHARGE:                              OPERATIVE REPORT   PREOPERATIVE DIAGNOSIS:  Bowel obstruction secondary to incarcerated ventral hernia.  POSTOPERATIVE DIAGNOSIS:  Bowel obstruction secondary to incarcerated ventral hernia.  PROCEDURE:  Exploratory laparotomy, lysis of adhesions, repair of incarcerated ventral incisional hernia with MTF mesh.  SURGEON:  Adolph Pollack, MD  ASSISTANT:  Letha Cape, PA  ANESTHESIA:  General.  INDICATIONS:  [name] is a 60 year old female who was admitted on Nov 12, 2010, with a small bowel obstruction, felt to be secondary to adhesions.  She actually improved and was on a regular diet, but over the past 1-2 days began having distention, despite having passing gas and having bowel movements and some discomfort where she has a known ventral incisional hernia.  Initial CT scan did not show this is being the side of obstruction.  However, CT enteropathy performed today demonstrated a transverse colon with dilated colon proximal to this as well as dilated small bowel.  This was felt to be the cause of her obstruction.  She subsequently is brought now for the above urgent procedure.  TECHNIQUE:  She was brought to the operating room, placed supine on the operating table, and general anesthetic was administered.  A Foley catheter and NG tube were inserted.  The abdominal wall was widely sterilely prepped and draped.  The midline incision was made beginning in the epigastric region, extending to an old scar down to the area of the umbilicus.  Subcutaneous tissue was divided with electrocautery. Above the area of the hernia, I was able to divide the  fascia into the peritoneal cavity.  I then continued to divide the fascia until I got around the area of hernia.  I went just lateral to this, dividing the fascia and found another hernia, containing omentum which was reduced. I then carefully dissected the omentum free from the fascia and divided some of the fascia leading up into the hernia  allowing me to reduce the transverse colon out of the hernia;   it was intact without evidence of injury.  I then noted dilated small intestine and there was 1 band adhesion leading to a partial small bowel obstruction between the small intestine and the intra-abdominal wall which was lysed.  Following this, I verified the NG tube position in the stomach and I milked back small bowel contents decompressed in small bowel. Approximately 2.7 L of small bowel contents were milked back.  I then ran the entire small intestine and there was no evidence of enterotomy.  Following this, I then raised subcutaneous flaps off the fascia on both sides.  I excised the hernia sac.  After raising subcutaneous flaps and mobilizing the fascia, I was able to bring the fascia together without tension.  I subsequently primarily closed the fascia as well as the hernia defect with interrupted #  1 Novafil sutures leaving these long.  I then brought a piece of MTF mesh, measuring 16 cm x 10 cm into the field.  Every other primary repair suture was passed through this mesh and then this was tied down anchoring the mesh directly over the primary repair.  The periphery of the mesh was then anchored to the fascia with interrupted #1 Novafil sutures.  This provided for good coverage with good overlap of the primary repair site.  I then irrigated out the subcutaneous tissue and controlled bleeding with electrocautery.  Once hemostasis was adequate, I made 2 stab incisions, one in the right lower quadrant and one in the left lower quadrant.  Two #19 Blake drains were placed, one  through each stab incision, and anchored to the skin with a 3-0 Ethilon sutures.  I then closed subcutaneous tissue over the drains and the mesh with a running 2- 0 Vicryl suture.  The skin was closed with staples.  Sterile dressings were applied.  She tolerated the procedures well without any apparent complications and was taken to recovery room in satisfactory condition.     Adolph Pollack, M.D.     Kari Baars  D:  11/21/2010  T:  11/22/2010  Job:  [id number]  Electronically Signed by Avel Peace M.D. on 11/22/2010 03:52:30 PM

## 2010-11-23 LAB — CBC
HCT: 34.9 % — ABNORMAL LOW (ref 36.0–46.0)
MCHC: 33.2 g/dL (ref 30.0–36.0)
MCV: 82.3 fL (ref 78.0–100.0)
Platelets: 102 10*3/uL — ABNORMAL LOW (ref 150–400)
RDW: 20.7 % — ABNORMAL HIGH (ref 11.5–15.5)

## 2010-11-23 LAB — COMPREHENSIVE METABOLIC PANEL
Albumin: 2.5 g/dL — ABNORMAL LOW (ref 3.5–5.2)
BUN: 9 mg/dL (ref 6–23)
Calcium: 8.4 mg/dL (ref 8.4–10.5)
Creatinine, Ser: 0.93 mg/dL (ref 0.4–1.2)
Glucose, Bld: 131 mg/dL — ABNORMAL HIGH (ref 70–99)
Total Protein: 5.5 g/dL — ABNORMAL LOW (ref 6.0–8.3)

## 2010-11-23 LAB — PHOSPHORUS: Phosphorus: 3.9 mg/dL (ref 2.3–4.6)

## 2010-11-23 LAB — DIFFERENTIAL
Eosinophils Absolute: 0 10*3/uL (ref 0.0–0.7)
Eosinophils Relative: 0 % (ref 0–5)
Lymphocytes Relative: 9 % — ABNORMAL LOW (ref 12–46)
Lymphs Abs: 0.6 10*3/uL — ABNORMAL LOW (ref 0.7–4.0)
Monocytes Absolute: 0.4 10*3/uL (ref 0.1–1.0)

## 2010-11-23 LAB — MAGNESIUM: Magnesium: 1.9 mg/dL (ref 1.5–2.5)

## 2010-11-23 LAB — GLUCOSE, CAPILLARY
Glucose-Capillary: 113 mg/dL — ABNORMAL HIGH (ref 70–99)
Glucose-Capillary: 131 mg/dL — ABNORMAL HIGH (ref 70–99)
Glucose-Capillary: 142 mg/dL — ABNORMAL HIGH (ref 70–99)

## 2010-11-23 LAB — CHOLESTEROL, TOTAL: Cholesterol: 135 mg/dL (ref 0–200)

## 2010-11-23 LAB — TRIGLYCERIDES: Triglycerides: 109 mg/dL (ref <150)

## 2010-11-24 LAB — GLUCOSE, CAPILLARY
Glucose-Capillary: 101 mg/dL — ABNORMAL HIGH (ref 70–99)
Glucose-Capillary: 123 mg/dL — ABNORMAL HIGH (ref 70–99)
Glucose-Capillary: 122 mg/dL — ABNORMAL HIGH (ref 70–99)

## 2010-11-25 LAB — GLUCOSE, CAPILLARY
Glucose-Capillary: 118 mg/dL — ABNORMAL HIGH (ref 70–99)
Glucose-Capillary: 119 mg/dL — ABNORMAL HIGH (ref 70–99)
Glucose-Capillary: 134 mg/dL — ABNORMAL HIGH (ref 70–99)
Glucose-Capillary: 135 mg/dL — ABNORMAL HIGH (ref 70–99)
Glucose-Capillary: 141 mg/dL — ABNORMAL HIGH (ref 70–99)
Glucose-Capillary: 151 mg/dL — ABNORMAL HIGH (ref 70–99)

## 2010-11-26 LAB — CBC
MCV: 82.1 fL (ref 78.0–100.0)
Platelets: 136 10*3/uL — ABNORMAL LOW (ref 150–400)
RBC: 4.08 MIL/uL (ref 3.87–5.11)
RDW: 19.9 % — ABNORMAL HIGH (ref 11.5–15.5)
WBC: 5 10*3/uL (ref 4.0–10.5)

## 2010-11-26 LAB — GLUCOSE, CAPILLARY
Glucose-Capillary: 141 mg/dL — ABNORMAL HIGH (ref 70–99)
Glucose-Capillary: 137 mg/dL — ABNORMAL HIGH (ref 70–99)
Glucose-Capillary: 97 mg/dL (ref 70–99)
Glucose-Capillary: 119 mg/dL — ABNORMAL HIGH (ref 70–99)

## 2010-11-26 LAB — COMPREHENSIVE METABOLIC PANEL
ALT: 13 U/L (ref 0–35)
AST: 11 U/L (ref 0–37)
Albumin: 2.1 g/dL — ABNORMAL LOW (ref 3.5–5.2)
Alkaline Phosphatase: 92 U/L (ref 39–117)
BUN: 18 mg/dL (ref 6–23)
Chloride: 107 mEq/L (ref 96–112)
GFR calc Af Amer: >60 mL/min (ref >60)
Potassium: 3.5 mEq/L (ref 3.5–5.1)
Sodium: 144 mEq/L (ref 135–145)
Total Bilirubin: 0.4 mg/dL (ref 0.3–1.2)
Total Protein: 5.4 g/dL — ABNORMAL LOW (ref 6.0–8.3)

## 2010-11-26 LAB — PHOSPHORUS: Phosphorus: 3.9 mg/dL (ref 2.3–4.6)

## 2010-11-26 LAB — DIFFERENTIAL
Basophils Absolute: 0.1 10*3/uL (ref 0.0–0.1)
Basophils Relative: 1 % (ref 0–1)
Eosinophils Absolute: 0.1 10*3/uL (ref 0.0–0.7)
Lymphocytes Relative: 22 % (ref 12–46)
Monocytes Absolute: 0.5 10*3/uL (ref 0.1–1.0)
Neutrophils Relative %: 67 % (ref 43–77)

## 2010-11-26 LAB — PREALBUMIN: Prealbumin: 9.6 mg/dL — ABNORMAL LOW (ref 17.0–34.0)

## 2010-11-27 LAB — GLUCOSE, CAPILLARY
Glucose-Capillary: 132 mg/dL — ABNORMAL HIGH (ref 70–99)
Glucose-Capillary: 107 mg/dL — ABNORMAL HIGH (ref 70–99)

## 2010-11-27 LAB — BASIC METABOLIC PANEL
BUN: 21 mg/dL (ref 6–23)
Chloride: 105 mEq/L (ref 96–112)
Creatinine, Ser: 0.83 mg/dL (ref 0.4–1.2)
GFR calc non Af Amer: >60 mL/min (ref >60)
Glucose, Bld: 132 mg/dL — ABNORMAL HIGH (ref 70–99)
Potassium: 3.9 mEq/L (ref 3.5–5.1)

## 2010-11-28 LAB — GLUCOSE, CAPILLARY
Glucose-Capillary: 90 mg/dL (ref 70–99)
Glucose-Capillary: 110 mg/dL — ABNORMAL HIGH (ref 70–99)

## 2010-11-29 LAB — PHOSPHORUS: Phosphorus: 3.4 mg/dL (ref 2.3–4.6)

## 2010-11-29 LAB — COMPREHENSIVE METABOLIC PANEL
ALT: 16 U/L (ref 0–35)
Alkaline Phosphatase: 112 U/L (ref 39–117)
BUN: 22 mg/dL (ref 6–23)
CO2: 29 mEq/L (ref 19–32)
GFR calc non Af Amer: >60 mL/min (ref >60)
Glucose, Bld: 129 mg/dL — ABNORMAL HIGH (ref 70–99)
Potassium: 4 mEq/L (ref 3.5–5.1)
Sodium: 141 mEq/L (ref 135–145)
Total Bilirubin: 0.2 mg/dL — ABNORMAL LOW (ref 0.3–1.2)

## 2010-11-29 LAB — MAGNESIUM: Magnesium: 2.2 mg/dL (ref 1.5–2.5)

## 2010-11-29 LAB — GLUCOSE, CAPILLARY
Glucose-Capillary: 136 mg/dL — ABNORMAL HIGH (ref 70–99)
Glucose-Capillary: 136 mg/dL — ABNORMAL HIGH (ref 70–99)
Glucose-Capillary: 110 mg/dL — ABNORMAL HIGH (ref 70–99)

## 2010-11-30 ENCOUNTER — Inpatient Hospital Stay (HOSPITAL_COMMUNITY): Payer: Medicaid Other

## 2010-11-30 LAB — CBC
HCT: 32.6 % — ABNORMAL LOW (ref 36.0–46.0)
Hemoglobin: 10.7 g/dL — ABNORMAL LOW (ref 12.0–15.0)
MCH: 27.1 pg (ref 26.0–34.0)
MCHC: 32.8 g/dL (ref 30.0–36.0)
RBC: 3.95 MIL/uL (ref 3.87–5.11)

## 2010-11-30 LAB — BASIC METABOLIC PANEL
BUN: 16 mg/dL (ref 6–23)
CO2: 27 mEq/L (ref 19–32)
Calcium: 8.1 mg/dL — ABNORMAL LOW (ref 8.4–10.5)
GFR calc non Af Amer: >60 mL/min (ref >60)
Glucose, Bld: 108 mg/dL — ABNORMAL HIGH (ref 70–99)

## 2010-11-30 LAB — GLUCOSE, CAPILLARY: Glucose-Capillary: 78 mg/dL (ref 70–99)

## 2010-12-01 LAB — GLUCOSE, CAPILLARY: Glucose-Capillary: 115 mg/dL — ABNORMAL HIGH (ref 70–99)

## 2010-12-02 LAB — GLUCOSE, CAPILLARY
Glucose-Capillary: 108 mg/dL — ABNORMAL HIGH (ref 70–99)
Glucose-Capillary: 112 mg/dL — ABNORMAL HIGH (ref 70–99)
Glucose-Capillary: 125 mg/dL — ABNORMAL HIGH (ref 70–99)

## 2010-12-03 LAB — GLUCOSE, CAPILLARY: Glucose-Capillary: 97 mg/dL (ref 70–99)

## 2010-12-05 ENCOUNTER — Emergency Department (HOSPITAL_COMMUNITY): Payer: Medicaid Other

## 2010-12-05 ENCOUNTER — Inpatient Hospital Stay (HOSPITAL_COMMUNITY)
Admission: EM | Admit: 2010-12-05 | Discharge: 2010-12-24 | DRG: 603 | Disposition: A | Discharge status: Home Health | Payer: Medicaid Other | Attending: Family Medicine | Admitting: Family Medicine

## 2010-12-05 DIAGNOSIS — I2789 Other specified pulmonary heart diseases: Secondary | ICD-10-CM | POA: Diagnosis present

## 2010-12-05 DIAGNOSIS — Z7982 Long term (current) use of aspirin: Secondary | ICD-10-CM

## 2010-12-05 DIAGNOSIS — C9000 Multiple myeloma not having achieved remission: Secondary | ICD-10-CM | POA: Diagnosis present

## 2010-12-05 DIAGNOSIS — A4902 Methicillin resistant Staphylococcus aureus infection, unspecified site: Secondary | ICD-10-CM | POA: Diagnosis present

## 2010-12-05 DIAGNOSIS — D500 Iron deficiency anemia secondary to blood loss (chronic): Secondary | ICD-10-CM | POA: Diagnosis present

## 2010-12-05 DIAGNOSIS — Z96659 Presence of unspecified artificial knee joint: Secondary | ICD-10-CM

## 2010-12-05 DIAGNOSIS — Z79899 Other long term (current) drug therapy: Secondary | ICD-10-CM

## 2010-12-05 DIAGNOSIS — E119 Type 2 diabetes mellitus without complications: Secondary | ICD-10-CM | POA: Diagnosis present

## 2010-12-05 DIAGNOSIS — N179 Acute kidney failure, unspecified: Secondary | ICD-10-CM | POA: Diagnosis not present

## 2010-12-05 DIAGNOSIS — E7800 Pure hypercholesterolemia, unspecified: Secondary | ICD-10-CM | POA: Diagnosis present

## 2010-12-05 DIAGNOSIS — I079 Rheumatic tricuspid valve disease, unspecified: Secondary | ICD-10-CM | POA: Diagnosis present

## 2010-12-05 DIAGNOSIS — N183 Chronic kidney disease, stage 3 unspecified: Secondary | ICD-10-CM | POA: Diagnosis present

## 2010-12-05 DIAGNOSIS — R197 Diarrhea, unspecified: Secondary | ICD-10-CM | POA: Diagnosis present

## 2010-12-05 DIAGNOSIS — K219 Gastro-esophageal reflux disease without esophagitis: Secondary | ICD-10-CM | POA: Diagnosis present

## 2010-12-05 DIAGNOSIS — E872 Acidosis, unspecified: Secondary | ICD-10-CM | POA: Diagnosis not present

## 2010-12-05 DIAGNOSIS — D638 Anemia in other chronic diseases classified elsewhere: Secondary | ICD-10-CM | POA: Diagnosis present

## 2010-12-05 DIAGNOSIS — I959 Hypotension, unspecified: Secondary | ICD-10-CM | POA: Diagnosis not present

## 2010-12-05 DIAGNOSIS — E876 Hypokalemia: Secondary | ICD-10-CM | POA: Diagnosis not present

## 2010-12-05 DIAGNOSIS — I129 Hypertensive chronic kidney disease with stage 1 through stage 4 chronic kidney disease, or unspecified chronic kidney disease: Secondary | ICD-10-CM | POA: Diagnosis present

## 2010-12-05 DIAGNOSIS — L02219 Cutaneous abscess of trunk, unspecified: Principal | ICD-10-CM | POA: Diagnosis present

## 2010-12-05 DIAGNOSIS — I509 Heart failure, unspecified: Secondary | ICD-10-CM | POA: Diagnosis present

## 2010-12-05 DIAGNOSIS — N390 Urinary tract infection, site not specified: Secondary | ICD-10-CM | POA: Diagnosis present

## 2010-12-05 DIAGNOSIS — Z6841 Body Mass Index (BMI) 40.0 and over, adult: Secondary | ICD-10-CM

## 2010-12-05 DIAGNOSIS — I824Z9 Acute embolism and thrombosis of unspecified deep veins of unspecified distal lower extremity: Secondary | ICD-10-CM | POA: Diagnosis not present

## 2010-12-05 LAB — CBC
Hemoglobin: 11.5 g/dL — ABNORMAL LOW (ref 12.0–15.0)
MCHC: 33.5 g/dL (ref 30.0–36.0)
RBC: 4.23 MIL/uL (ref 3.87–5.11)
WBC: 6.8 10*3/uL (ref 4.0–10.5)

## 2010-12-05 LAB — COMPREHENSIVE METABOLIC PANEL
BUN: 9 mg/dL (ref 6–23)
CO2: 24 mEq/L (ref 19–32)
Calcium: 8.4 mg/dL (ref 8.4–10.5)
Chloride: 101 mEq/L (ref 96–112)
Creatinine, Ser: 1.17 mg/dL (ref 0.4–1.2)
GFR calc Af Amer: 57 mL/min — ABNORMAL LOW (ref >60)
GFR calc non Af Amer: 47 mL/min — ABNORMAL LOW (ref >60)
Glucose, Bld: 104 mg/dL — ABNORMAL HIGH (ref 70–99)
Total Bilirubin: 0.4 mg/dL (ref 0.3–1.2)

## 2010-12-05 LAB — URINALYSIS, ROUTINE W REFLEX MICROSCOPIC
Glucose, UA: NEGATIVE mg/dL
Leukocytes, UA: NEGATIVE
Protein, ur: 30 mg/dL — AB
Specific Gravity, Urine: 1.008 (ref 1.005–1.030)
pH: 6.5 (ref 5.0–8.0)

## 2010-12-05 LAB — DIFFERENTIAL
Basophils Relative: 0 % (ref 0–1)
Eosinophils Relative: 3 % (ref 0–5)
Lymphocytes Relative: 10 % — ABNORMAL LOW (ref 12–46)
Monocytes Relative: 10 % (ref 3–12)
Neutrophils Relative %: 77 % (ref 43–77)

## 2010-12-05 LAB — LIPASE, BLOOD: Lipase: 27 U/L (ref 11–59)

## 2010-12-05 LAB — URINE MICROSCOPIC-ADD ON

## 2010-12-06 ENCOUNTER — Inpatient Hospital Stay (HOSPITAL_COMMUNITY): Payer: Medicaid Other

## 2010-12-06 MED ORDER — IOHEXOL 300 MG/ML  SOLN
100.0000 mL | Freq: Once | INTRAMUSCULAR | Status: AC | PRN
  Administered 2010-12-06: 100 mL via INTRAVENOUS

## 2010-12-07 ENCOUNTER — Inpatient Hospital Stay (HOSPITAL_COMMUNITY): Payer: Medicaid Other

## 2010-12-07 LAB — BASIC METABOLIC PANEL
GFR calc Af Amer: 57 mL/min — ABNORMAL LOW (ref >60)
GFR calc non Af Amer: 47 mL/min — ABNORMAL LOW (ref >60)
Glucose, Bld: 111 mg/dL — ABNORMAL HIGH (ref 70–99)
Potassium: 3.3 mEq/L — ABNORMAL LOW (ref 3.5–5.1)
Sodium: 142 mEq/L (ref 135–145)

## 2010-12-07 LAB — GLUCOSE, CAPILLARY
Glucose-Capillary: 101 mg/dL — ABNORMAL HIGH (ref 70–99)
Glucose-Capillary: 121 mg/dL — ABNORMAL HIGH (ref 70–99)

## 2010-12-07 LAB — URINE CULTURE: Colony Count: >=100000

## 2010-12-07 LAB — PROTIME-INR
INR: 1.23 (ref 0.00–1.49)
Prothrombin Time: 15.8 seconds — ABNORMAL HIGH (ref 11.6–15.2)

## 2010-12-08 LAB — BASIC METABOLIC PANEL
BUN: 4 mg/dL — ABNORMAL LOW (ref 6–23)
CO2: 23 mEq/L (ref 19–32)
Calcium: 7.3 mg/dL — ABNORMAL LOW (ref 8.4–10.5)
Glucose, Bld: 108 mg/dL — ABNORMAL HIGH (ref 70–99)
Sodium: 138 mEq/L (ref 135–145)

## 2010-12-08 LAB — GLUCOSE, CAPILLARY
Glucose-Capillary: 124 mg/dL — ABNORMAL HIGH (ref 70–99)
Glucose-Capillary: 106 mg/dL — ABNORMAL HIGH (ref 70–99)

## 2010-12-09 LAB — CBC
Hemoglobin: 9.8 g/dL — ABNORMAL LOW (ref 12.0–15.0)
MCH: 27.1 pg (ref 26.0–34.0)
MCHC: 33.7 g/dL (ref 30.0–36.0)
MCV: 80.4 fL (ref 78.0–100.0)
Platelets: 138 10*3/uL — ABNORMAL LOW (ref 150–400)
RBC: 3.62 MIL/uL — ABNORMAL LOW (ref 3.87–5.11)

## 2010-12-09 LAB — COMPREHENSIVE METABOLIC PANEL
CO2: 21 mEq/L (ref 19–32)
Calcium: 6.7 mg/dL — ABNORMAL LOW (ref 8.4–10.5)
Creatinine, Ser: 1.33 mg/dL — ABNORMAL HIGH (ref 0.50–1.10)
GFR calc Af Amer: 49 mL/min — ABNORMAL LOW (ref >60)
GFR calc non Af Amer: 41 mL/min — ABNORMAL LOW (ref >60)
Glucose, Bld: 107 mg/dL — ABNORMAL HIGH (ref 70–99)

## 2010-12-09 LAB — GLUCOSE, CAPILLARY
Glucose-Capillary: 94 mg/dL (ref 70–99)
Glucose-Capillary: 111 mg/dL — ABNORMAL HIGH (ref 70–99)

## 2010-12-09 LAB — CLOSTRIDIUM DIFFICILE BY PCR: Toxigenic C. Difficile by PCR: NEGATIVE

## 2010-12-10 ENCOUNTER — Inpatient Hospital Stay (HOSPITAL_COMMUNITY): Payer: Medicaid Other

## 2010-12-10 LAB — BASIC METABOLIC PANEL
Chloride: 109 mEq/L (ref 96–112)
Creatinine, Ser: 1.6 mg/dL — ABNORMAL HIGH (ref 0.50–1.10)
GFR calc Af Amer: 40 mL/min — ABNORMAL LOW (ref >60)
Potassium: 2.9 mEq/L — ABNORMAL LOW (ref 3.5–5.1)
Sodium: 138 mEq/L (ref 135–145)

## 2010-12-10 LAB — GLUCOSE, CAPILLARY: Glucose-Capillary: 121 mg/dL — ABNORMAL HIGH (ref 70–99)

## 2010-12-10 LAB — BODY FLUID CULTURE

## 2010-12-10 LAB — WOUND CULTURE

## 2010-12-10 LAB — PRO B NATRIURETIC PEPTIDE: Pro B Natriuretic peptide (BNP): 4123 pg/mL — ABNORMAL HIGH (ref 0–125)

## 2010-12-11 ENCOUNTER — Inpatient Hospital Stay (HOSPITAL_COMMUNITY): Payer: Medicaid Other

## 2010-12-11 LAB — DIFFERENTIAL
Basophils Absolute: 0 10*3/uL (ref 0.0–0.1)
Basophils Relative: 0 % (ref 0–1)
Eosinophils Absolute: 0.3 10*3/uL (ref 0.0–0.7)
Eosinophils Relative: 4 % (ref 0–5)
Lymphocytes Relative: 9 % — ABNORMAL LOW (ref 12–46)
Lymphs Abs: 0.6 10*3/uL — ABNORMAL LOW (ref 0.7–4.0)
Monocytes Absolute: 0.7 10*3/uL (ref 0.1–1.0)
Monocytes Relative: 10 % (ref 3–12)
Neutro Abs: 5.6 10*3/uL (ref 1.7–7.7)
Neutrophils Relative %: 77 % (ref 43–77)

## 2010-12-11 LAB — BASIC METABOLIC PANEL
BUN: 9 mg/dL (ref 6–23)
Calcium: 6.6 mg/dL — ABNORMAL LOW (ref 8.4–10.5)
Creatinine, Ser: 1.72 mg/dL — ABNORMAL HIGH (ref 0.50–1.10)
GFR calc Af Amer: 37 mL/min — ABNORMAL LOW (ref >60)
GFR calc non Af Amer: 30 mL/min — ABNORMAL LOW (ref >60)
Potassium: 3.4 mEq/L — ABNORMAL LOW (ref 3.5–5.1)

## 2010-12-11 LAB — GLUCOSE, CAPILLARY
Glucose-Capillary: 73 mg/dL (ref 70–99)
Glucose-Capillary: 113 mg/dL — ABNORMAL HIGH (ref 70–99)

## 2010-12-11 LAB — CBC
HCT: 27 % — ABNORMAL LOW (ref 36.0–46.0)
MCHC: 34.1 g/dL (ref 30.0–36.0)
RDW: 19.6 % — ABNORMAL HIGH (ref 11.5–15.5)

## 2010-12-11 LAB — MAGNESIUM: Magnesium: 1.4 mg/dL — ABNORMAL LOW (ref 1.5–2.5)

## 2010-12-12 ENCOUNTER — Other Ambulatory Visit (HOSPITAL_COMMUNITY): Payer: Medicaid Other

## 2010-12-12 ENCOUNTER — Inpatient Hospital Stay (HOSPITAL_COMMUNITY): Payer: Medicaid Other

## 2010-12-12 LAB — RENAL FUNCTION PANEL
Calcium: 6.6 mg/dL — ABNORMAL LOW (ref 8.4–10.5)
GFR calc Af Amer: 34 mL/min — ABNORMAL LOW (ref >60)
GFR calc non Af Amer: 28 mL/min — ABNORMAL LOW (ref >60)
Phosphorus: 2.4 mg/dL (ref 2.3–4.6)
Sodium: 139 mEq/L (ref 135–145)

## 2010-12-13 LAB — BASIC METABOLIC PANEL
CO2: 17 mEq/L — ABNORMAL LOW (ref 19–32)
Calcium: 7 mg/dL — ABNORMAL LOW (ref 8.4–10.5)
Creatinine, Ser: 2.12 mg/dL — ABNORMAL HIGH (ref 0.50–1.10)
Glucose, Bld: 75 mg/dL (ref 70–99)
Sodium: 141 mEq/L (ref 135–145)

## 2010-12-13 LAB — GLUCOSE, CAPILLARY
Glucose-Capillary: 103 mg/dL — ABNORMAL HIGH (ref 70–99)
Glucose-Capillary: 117 mg/dL — ABNORMAL HIGH (ref 70–99)

## 2010-12-13 LAB — CBC
Hemoglobin: 9.1 g/dL — ABNORMAL LOW (ref 12.0–15.0)
MCH: 26.8 pg (ref 26.0–34.0)
MCV: 77.9 fL — ABNORMAL LOW (ref 78.0–100.0)
RBC: 3.39 MIL/uL — ABNORMAL LOW (ref 3.87–5.11)

## 2010-12-13 LAB — PHOSPHORUS: Phosphorus: 2.8 mg/dL (ref 2.3–4.6)

## 2010-12-14 LAB — BASIC METABOLIC PANEL
Chloride: 114 mEq/L — ABNORMAL HIGH (ref 96–112)
GFR calc Af Amer: 26 mL/min — ABNORMAL LOW (ref >60)
Potassium: 4 mEq/L (ref 3.5–5.1)
Sodium: 140 mEq/L (ref 135–145)

## 2010-12-14 LAB — GLUCOSE, CAPILLARY
Glucose-Capillary: 71 mg/dL (ref 70–99)
Glucose-Capillary: 95 mg/dL (ref 70–99)
Glucose-Capillary: 99 mg/dL (ref 70–99)
Glucose-Capillary: 118 mg/dL — ABNORMAL HIGH (ref 70–99)

## 2010-12-14 LAB — OSMOLALITY: Osmolality: 282 mOsm/kg (ref 275–300)

## 2010-12-15 DIAGNOSIS — M7989 Other specified soft tissue disorders: Secondary | ICD-10-CM

## 2010-12-15 LAB — MICROALBUMIN, URINE: Microalb, Ur: 0.5 mg/dL (ref 0.00–1.89)

## 2010-12-15 LAB — BODY FLUID CULTURE

## 2010-12-15 LAB — BASIC METABOLIC PANEL
BUN: 10 mg/dL (ref 6–23)
CO2: 19 mEq/L (ref 19–32)
Glucose, Bld: 83 mg/dL (ref 70–99)
Potassium: 4.2 mEq/L (ref 3.5–5.1)
Sodium: 138 mEq/L (ref 135–145)

## 2010-12-15 LAB — CBC
MCHC: 34.1 g/dL (ref 30.0–36.0)
Platelets: 175 10*3/uL (ref 150–400)
RDW: 21.2 % — ABNORMAL HIGH (ref 11.5–15.5)

## 2010-12-15 LAB — NA AND K (SODIUM & POTASSIUM), RAND UR
Potassium Urine: 6 mEq/L
Sodium, Ur: 87 mEq/L

## 2010-12-15 LAB — GLUCOSE, CAPILLARY: Glucose-Capillary: 90 mg/dL (ref 70–99)

## 2010-12-15 LAB — PROTIME-INR
INR: 1.11 (ref 0.00–1.49)
Prothrombin Time: 14.5 seconds (ref 11.6–15.2)

## 2010-12-15 LAB — URINALYSIS, ROUTINE W REFLEX MICROSCOPIC
Bilirubin Urine: NEGATIVE
Hgb urine dipstick: NEGATIVE
Nitrite: NEGATIVE
Protein, ur: NEGATIVE mg/dL
Urobilinogen, UA: 0.2 mg/dL (ref 0.0–1.0)

## 2010-12-15 LAB — URINE MICROSCOPIC-ADD ON

## 2010-12-16 ENCOUNTER — Inpatient Hospital Stay (HOSPITAL_COMMUNITY): Payer: Medicaid Other

## 2010-12-16 LAB — CBC
MCH: 26.4 pg (ref 26.0–34.0)
MCHC: 34 g/dL (ref 30.0–36.0)
Platelets: 164 10*3/uL (ref 150–400)
RDW: 20.6 % — ABNORMAL HIGH (ref 11.5–15.5)

## 2010-12-16 LAB — RENAL FUNCTION PANEL
BUN: 11 mg/dL (ref 6–23)
Glucose, Bld: 101 mg/dL — ABNORMAL HIGH (ref 70–99)
Phosphorus: 4.6 mg/dL (ref 2.3–4.6)
Potassium: 4.2 mEq/L (ref 3.5–5.1)

## 2010-12-16 LAB — GLUCOSE, CAPILLARY
Glucose-Capillary: 87 mg/dL (ref 70–99)
Glucose-Capillary: 122 mg/dL — ABNORMAL HIGH (ref 70–99)
Glucose-Capillary: 101 mg/dL — ABNORMAL HIGH (ref 70–99)

## 2010-12-16 LAB — HEPARIN LEVEL (UNFRACTIONATED): Heparin Unfractionated: 0.54 IU/mL (ref 0.30–0.70)

## 2010-12-17 ENCOUNTER — Inpatient Hospital Stay (HOSPITAL_COMMUNITY): Payer: Medicaid Other

## 2010-12-17 LAB — HEPARIN LEVEL (UNFRACTIONATED): Heparin Unfractionated: 0.47 IU/mL (ref 0.30–0.70)

## 2010-12-17 LAB — ANAEROBIC CULTURE

## 2010-12-17 LAB — CBC
Hemoglobin: 9.2 g/dL — ABNORMAL LOW (ref 12.0–15.0)
MCH: 26.4 pg (ref 26.0–34.0)
MCV: 78.2 fL (ref 78.0–100.0)
RBC: 3.49 MIL/uL — ABNORMAL LOW (ref 3.87–5.11)
WBC: 7.9 10*3/uL (ref 4.0–10.5)

## 2010-12-17 LAB — BASIC METABOLIC PANEL
GFR calc Af Amer: 21 mL/min — ABNORMAL LOW (ref >60)
GFR calc non Af Amer: 17 mL/min — ABNORMAL LOW (ref >60)
Glucose, Bld: 78 mg/dL (ref 70–99)
Potassium: 4.7 mEq/L (ref 3.5–5.1)
Sodium: 140 mEq/L (ref 135–145)

## 2010-12-17 LAB — PROTIME-INR: INR: 1.29 (ref 0.00–1.49)

## 2010-12-17 LAB — PHOSPHORUS: Phosphorus: 5.3 mg/dL — ABNORMAL HIGH (ref 2.3–4.6)

## 2010-12-17 LAB — GLUCOSE, CAPILLARY: Glucose-Capillary: 117 mg/dL — ABNORMAL HIGH (ref 70–99)

## 2010-12-18 ENCOUNTER — Inpatient Hospital Stay (HOSPITAL_COMMUNITY): Payer: Medicaid Other

## 2010-12-18 DIAGNOSIS — K56609 Unspecified intestinal obstruction, unspecified as to partial versus complete obstruction: Secondary | ICD-10-CM

## 2010-12-18 DIAGNOSIS — C9001 Multiple myeloma in remission: Secondary | ICD-10-CM

## 2010-12-18 LAB — URINALYSIS, ROUTINE W REFLEX MICROSCOPIC
Ketones, ur: NEGATIVE mg/dL
Nitrite: NEGATIVE
pH: 5.5 (ref 5.0–8.0)

## 2010-12-18 LAB — COMPREHENSIVE METABOLIC PANEL
Alkaline Phosphatase: 101 U/L (ref 39–117)
BUN: 12 mg/dL (ref 6–23)
CO2: 20 mEq/L (ref 19–32)
Chloride: 116 mEq/L — ABNORMAL HIGH (ref 96–112)
Creatinine, Ser: 2.8 mg/dL — ABNORMAL HIGH (ref 0.50–1.10)
GFR calc non Af Amer: 17 mL/min — ABNORMAL LOW (ref >60)
Glucose, Bld: 83 mg/dL (ref 70–99)
Potassium: 4.6 mEq/L (ref 3.5–5.1)
Total Bilirubin: 0.1 mg/dL — ABNORMAL LOW (ref 0.3–1.2)

## 2010-12-18 LAB — CBC
HCT: 25 % — ABNORMAL LOW (ref 36.0–46.0)
Hemoglobin: 8.4 g/dL — ABNORMAL LOW (ref 12.0–15.0)
MCH: 26.3 pg (ref 26.0–34.0)
MCV: 78.4 fL (ref 78.0–100.0)
RBC: 3.19 MIL/uL — ABNORMAL LOW (ref 3.87–5.11)
WBC: 7.5 10*3/uL (ref 4.0–10.5)

## 2010-12-18 LAB — URINE MICROSCOPIC-ADD ON

## 2010-12-18 LAB — PROTIME-INR: Prothrombin Time: 25.7 seconds — ABNORMAL HIGH (ref 11.6–15.2)

## 2010-12-18 LAB — IRON AND TIBC
Iron: 45 ug/dL (ref 42–135)
UIBC: 97 ug/dL

## 2010-12-18 LAB — FERRITIN: Ferritin: 360 ng/mL — ABNORMAL HIGH (ref 10–291)

## 2010-12-18 LAB — PHOSPHORUS: Phosphorus: 4.9 mg/dL — ABNORMAL HIGH (ref 2.3–4.6)

## 2010-12-18 LAB — GLUCOSE, CAPILLARY: Glucose-Capillary: 107 mg/dL — ABNORMAL HIGH (ref 70–99)

## 2010-12-18 NOTE — Consult Note (Signed)
NAMEJAKAIYA, [name]:  [id number]  MEDICAL RECORD NO.:  [id number]  LOCATION:  1313                         FACILITY:  [facility]  PHYSICIAN:  Wilber Bihari. Caryn Section, M.D.   DATE OF BIRTH:  03-03-1951  DATE OF CONSULTATION:  12/17/2010 DATE OF DISCHARGE:                                CONSULTATION   Renal Consultation  PRIMARY CARE PHYSICIAN:  Norberto Sorenson, M.D. at [facility] on [address].  HISTORY OF PRESENT ILLNESS:  [name] is a 61 year old black woman admitted on December 05, 2010 with abscess/seroma of anterior abdominal wall following ventral hernia repair around the end of May.  She had drainage of this fluid collection, has been receiving antibiotics.  She had CT scan with contrast and has past history of multiple myeloma. Creatinine has risen from 1.17 (December 05, 2010) to 2.78 today.  Renal consult requested by Dr. Venetia Constable.  PAST MEDICAL HISTORY: 1. Ventral hernia repair on Nov 22, 2010. 2. Anterior abdominal wall abscess/seroma drained on December 07, 2010. 3. Right total knee replacement "at [facility] about five years ago." 4. Multiple myeloma (sees Dr. Clelia Croft) treated with melphalan and     prednisone in 2008. 5. Acute renal failure (associated with multiple myeloma, volume     depletion, and ACE inhibitors) with creatinine greater than 8 in June     2008, did not require dialysis, followed in office by Dr.     Arrie Aran. 6. Diabetes mellitus - 2. 7. Anemia. 8. Hypertension. 9. One ovary has been removed (still has uterus and one ovary,     operated on by Dr. Christie Beckers). 10.DVT right lower extremity this admission.  MEDICATIONS: 1. Atenolol 25. 2. Wellbutrin 150. 3. Cleocin 600 t.i.d. 4. Sliding scale insulin.  ALLERGIES:  MORPHINE (rash).  REVIEW OF SYSTEMS:  Positive for edema and DOE.  No angina.  No claudication.  No melena, hematochezia.  No gross hematuria.  No renal colic.  No purulent sputum.  No hemoptysis.  No cold or  heat intolerance.  No recent weight gain or weight loss.  SOCIAL HISTORY:  She was born in [place].  She finished tenth grade. 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  She has not worked in several years.  She and her husband have been married for 15 years.  He is in a nursing home "dementia."  No cigarettes in 20 years (10 pack-year history).  No alcohol.  No illegal drugs.  FAMILY HISTORY:  Father died at age 68 of stroke.  Mother died of at 38, CHF.  No children.  Three sisters are healthy.  PHYSICAL EXAMINATION:  GENERAL:  She is awake, alert, courteous, friendly. VITAL SIGNS:  Temperature 97.1, pulse 85, respirations 18, blood pressure 110/80. CHEST:  Clear. HEART:  No rub. ABDOMEN:  Midline scar from umbilicus to pubis, drain left anterior abdominal wall. EXTREMITIES:  1+ pretibial edema bilaterally. NEUROLOGIC:  Right-handed strength equal sensation intact.  LABORATORY DATA AND DIAGNOSTIC STUDIES:  Urinalysis SG 1.010, pH 5, dipstick negative, 0-2 white cells, 0-2 red cells  urine sodium 87, urine creatinine 24.   Hemoglobin 9.2, WBC 7900, PLTs 220,000.   Sodium  140, potassium 4.7, chloride 114, CO2 of 19, BUN 12, CR 2.78, calcium 8.1,    albumin 1.8, phosphorous 4.6.   Renal ultrasound, right kidney 12.3 cm,left kidney 12.0 cm.  No hydronephrosis noted.   Nov 22, 2010,  BUN 9,CR 1.1 with weight 127 kg.   December 05, 2010, BUN 9, CR 1.17.   CT with IV contrast done on December 06, 2010.   December 08, 2010, BUN 4, CR 1.29. December 12, 2010, BUN 10, CR 1.82 (weight 129 kg).   December 16, 2010, BUN 11, CR 2.57       and today BUN 12, CR 2.78.  IMPRESSION:  Acute kidney injury.  I suspect radiocontrast given on December 06, 2010.  She could also have decreased renal function secondary to recurrence of myeloma (I called Dr. Clelia Croft, he is not in the office today).  She does not appear to be volume depleted.  I do not think that she needs any  further intravenous fluids.  PLAN:  Discontinue IV fluids, check SPEP, UPEP, free light chains and bone survey.  Ask Dr. Clelia Croft to see.  No radiocontrast.  No NSAIDs. Treat IV Lasix 100 mg IV x1 now.  We will follow.     Lajoy Vanamburg F. Caryn Section, M.D.     RFF/MEDQ  D:  12/17/2010  T:  12/17/2010  Job:  [id number]  Electronically Signed by Marina Gravel M.D. on 12/18/2010 01:07:24 PM

## 2010-12-19 LAB — GLUCOSE, CAPILLARY
Glucose-Capillary: 96 mg/dL (ref 70–99)
Glucose-Capillary: 119 mg/dL — ABNORMAL HIGH (ref 70–99)

## 2010-12-19 LAB — COMPREHENSIVE METABOLIC PANEL
Albumin: 2.2 g/dL — ABNORMAL LOW (ref 3.5–5.2)
BUN: 14 mg/dL (ref 6–23)
Calcium: 8.4 mg/dL (ref 8.4–10.5)
Creatinine, Ser: 2.93 mg/dL — ABNORMAL HIGH (ref 0.50–1.10)
GFR calc Af Amer: 20 mL/min — ABNORMAL LOW (ref >60)
Glucose, Bld: 84 mg/dL (ref 70–99)
Potassium: 4.8 mEq/L (ref 3.5–5.1)
Total Protein: 6.2 g/dL (ref 6.0–8.3)

## 2010-12-19 LAB — PROTIME-INR
INR: 2.44 — ABNORMAL HIGH (ref 0.00–1.49)
Prothrombin Time: 26.9 seconds — ABNORMAL HIGH (ref 11.6–15.2)

## 2010-12-19 LAB — CBC
HCT: 29.4 % — ABNORMAL LOW (ref 36.0–46.0)
Hemoglobin: 9.9 g/dL — ABNORMAL LOW (ref 12.0–15.0)
MCH: 26.5 pg (ref 26.0–34.0)
MCHC: 33.7 g/dL (ref 30.0–36.0)
RDW: 22.1 % — ABNORMAL HIGH (ref 11.5–15.5)

## 2010-12-19 LAB — HEPARIN LEVEL (UNFRACTIONATED): Heparin Unfractionated: 0.49 IU/mL (ref 0.30–0.70)

## 2010-12-19 LAB — KAPPA/LAMBDA LIGHT CHAINS: Kappa, lambda light chain ratio: 965.52 (ref 0.26–1.65)

## 2010-12-19 LAB — PHOSPHORUS: Phosphorus: 4.3 mg/dL (ref 2.3–4.6)

## 2010-12-20 LAB — PROTIME-INR: Prothrombin Time: 23.3 seconds — ABNORMAL HIGH (ref 11.6–15.2)

## 2010-12-20 LAB — PROTEIN ELECTROPH W RFLX QUANT IMMUNOGLOBULINS
Albumin ELP: 40.6 % — ABNORMAL LOW (ref 55.8–66.1)
Alpha-1-Globulin: 9.6 % — ABNORMAL HIGH (ref 2.9–4.9)
Alpha-2-Globulin: 17.1 % — ABNORMAL HIGH (ref 7.1–11.8)
Beta 2: 4.9 % (ref 3.2–6.5)

## 2010-12-20 LAB — GLUCOSE, CAPILLARY
Glucose-Capillary: 131 mg/dL — ABNORMAL HIGH (ref 70–99)
Glucose-Capillary: 105 mg/dL — ABNORMAL HIGH (ref 70–99)

## 2010-12-20 LAB — RENAL FUNCTION PANEL
CO2: 22 mEq/L (ref 19–32)
Calcium: 7.9 mg/dL — ABNORMAL LOW (ref 8.4–10.5)
GFR calc Af Amer: 23 mL/min — ABNORMAL LOW (ref >60)
GFR calc non Af Amer: 19 mL/min — ABNORMAL LOW (ref >60)
Potassium: 4.7 mEq/L (ref 3.5–5.1)
Sodium: 137 mEq/L (ref 135–145)

## 2010-12-20 LAB — CBC
Hemoglobin: 8.7 g/dL — ABNORMAL LOW (ref 12.0–15.0)
MCH: 26.3 pg (ref 26.0–34.0)
Platelets: 239 10*3/uL (ref 150–400)
RBC: 3.12 MIL/uL — ABNORMAL LOW (ref 3.87–5.11)
WBC: 6.6 10*3/uL (ref 4.0–10.5)

## 2010-12-20 LAB — LACTATE DEHYDROGENASE: LDH: 336 U/L — ABNORMAL HIGH (ref 94–250)

## 2010-12-20 LAB — IMMUNOFIXATION ADD-ON

## 2010-12-21 ENCOUNTER — Inpatient Hospital Stay (HOSPITAL_COMMUNITY): Payer: Medicaid Other

## 2010-12-21 LAB — RENAL FUNCTION PANEL
BUN: 14 mg/dL (ref 6–23)
CO2: 26 mEq/L (ref 19–32)
Chloride: 109 mEq/L (ref 96–112)
Creatinine, Ser: 2.61 mg/dL — ABNORMAL HIGH (ref 0.50–1.10)
GFR calc non Af Amer: 19 mL/min — ABNORMAL LOW (ref >60)
Glucose, Bld: 82 mg/dL (ref 70–99)

## 2010-12-21 LAB — CBC
MCH: 27 pg (ref 26.0–34.0)
MCHC: 33.9 g/dL (ref 30.0–36.0)
Platelets: 276 10*3/uL (ref 150–400)
RDW: 21.8 % — ABNORMAL HIGH (ref 11.5–15.5)

## 2010-12-21 LAB — GLUCOSE, CAPILLARY: Glucose-Capillary: 128 mg/dL — ABNORMAL HIGH (ref 70–99)

## 2010-12-21 LAB — IGG, IGA, IGM: IgG (Immunoglobin G), Serum: 1140 mg/dL (ref 690–1700)

## 2010-12-22 LAB — COMPREHENSIVE METABOLIC PANEL
ALT: 12 U/L (ref 0–35)
AST: 16 U/L (ref 0–37)
CO2: 24 mEq/L (ref 19–32)
Calcium: 7.4 mg/dL — ABNORMAL LOW (ref 8.4–10.5)
Sodium: 141 mEq/L (ref 135–145)
Total Protein: 5.8 g/dL — ABNORMAL LOW (ref 6.0–8.3)

## 2010-12-22 LAB — PROTIME-INR: INR: 2.66 — ABNORMAL HIGH (ref 0.00–1.49)

## 2010-12-22 LAB — CBC
HCT: 24.9 % — ABNORMAL LOW (ref 36.0–46.0)
Platelets: 271 10*3/uL (ref 150–400)
RBC: 3.1 MIL/uL — ABNORMAL LOW (ref 3.87–5.11)
RDW: 22 % — ABNORMAL HIGH (ref 11.5–15.5)
WBC: 5.9 10*3/uL (ref 4.0–10.5)

## 2010-12-22 LAB — GLUCOSE, CAPILLARY
Glucose-Capillary: 107 mg/dL — ABNORMAL HIGH (ref 70–99)
Glucose-Capillary: 103 mg/dL — ABNORMAL HIGH (ref 70–99)

## 2010-12-23 LAB — BASIC METABOLIC PANEL
GFR calc Af Amer: 25 mL/min — ABNORMAL LOW (ref >60)
GFR calc non Af Amer: 21 mL/min — ABNORMAL LOW (ref >60)
Potassium: 4.7 mEq/L (ref 3.5–5.1)
Sodium: 138 mEq/L (ref 135–145)

## 2010-12-23 LAB — HEMOGLOBIN A1C
Hgb A1c MFr Bld: 6 % — ABNORMAL HIGH (ref <5.7)
Mean Plasma Glucose: 126 mg/dL — ABNORMAL HIGH (ref <117)

## 2010-12-23 LAB — CBC
Platelets: 290 10*3/uL (ref 150–400)
RBC: 3.3 MIL/uL — ABNORMAL LOW (ref 3.87–5.11)
WBC: 5.3 10*3/uL (ref 4.0–10.5)

## 2010-12-23 LAB — PROTIME-INR: Prothrombin Time: 28.3 seconds — ABNORMAL HIGH (ref 11.6–15.2)

## 2010-12-24 LAB — CBC
Hemoglobin: 7.6 g/dL — ABNORMAL LOW (ref 12.0–15.0)
MCH: 26.9 pg (ref 26.0–34.0)
Platelets: 270 10*3/uL (ref 150–400)
WBC: 5.3 10*3/uL (ref 4.0–10.5)

## 2010-12-24 LAB — PROTIME-INR
INR: 2.65 — ABNORMAL HIGH (ref 0.00–1.49)
Prothrombin Time: 28.7 seconds — ABNORMAL HIGH (ref 11.6–15.2)

## 2010-12-24 LAB — GLUCOSE, CAPILLARY: Glucose-Capillary: 93 mg/dL (ref 70–99)

## 2010-12-24 NOTE — H&P (Signed)
[name], Howe NO.:  [id number]  MEDICAL RECORD NO.:  [id number]  LOCATION:  WLED                         FACILITY:  [facility]  PHYSICIAN:  Sharlet Salina T. Melayna Robarts, M.D.DATE OF BIRTH:  03-Oct-1950  DATE OF ADMISSION:  12/05/2010 DATE OF DISCHARGE:                             HISTORY & PHYSICAL   CHIEF COMPLAINT:  Nausea, vomiting, diarrhea, abdominal pain.  PRESENT ILLNESS:  [name] is a 60 year old female who was hospitalized at [facility] from May 21st to June 11th.  She presented with small bowel obstruction and ventral hernia that initially improved nonoperatively, but subsequently worsened and she underwent an urgent ventral hernia repair by Dr. Abbey Chatters on May 31st using MTF mesh.  Her postoperative course was marked by some persistent ileus, which per hospital records improved and she was discharged home 2 days ago.  The patient states, however, that she really did not feel well when she wenthome with nausea and diarrhea and this has continued or worsened during the 2 days since she has been home.  She complains of continued nausea, unable to take much in the way of fluids or food, and unable to take her medications.  She had been vomiting about twice a day, nonbilious and nonbloody.  She has had diarrhea, watery, several times per day.  She has felt somewhat chilled, but has not taken her temperature.  The patient also complains of abdominal pain centered around her midline incision and worse with motion and turning.  It is not affected by eating, vomiting, or with bowel movements.  She presents to [facility] today.  PAST MEDICAL HISTORY: 1. Hypertension. 2. History of multiple myeloma, now in remission. 3. History of congestive heart failure. 4. Elevated cholesterol. 5. GERD. 6. History of renal insufficiency. 7. Borderline diabetes. 8. Morbid obesity.  PAST SURGICAL HISTORY: 1. Hysterectomy. 2. Panniculectomy. 3.  Breast reduction. 4. Knee replacement.  MEDICATIONS:  At discharge 2 days ago, medications were 1. Atenolol 25 mg daily. 2. Lortab elixir q.4 p.r.n. 3. Lasix 20 mg daily. 4. Amlodipine 5 daily. 5. Aspirin 81 daily. 6. Bupropion XL 150 daily. 7. Multivitamin daily. 8. Oxybutynin 5 daily. 9. Potassium chloride 20 mEq daily. 10.Pravastatin 40 daily. 11.Ranitidine 150 mg twice daily. 12.Zofran 8 mg tablet q.4 p.r.n.  ALLERGIES:  MORPHINE.  SOCIAL HISTORY:  Lives alone, disabled.  No cigarettes or alcohol use.  FAMILY HISTORY:  Noncontributory.  REVIEW OF SYSTEMS:  GENERAL:  Positive for chills, has not taken her temperature.  HEENT:  Denies vision, hearing, swallowing problems. RESPIRATORY:  Denies shortness of breath, cough, wheezing.  CARDIAC: Denies chest pain, palpitations, swelling.  ABDOMEN/GI:  As above.  GU: No urinary burning, frequency.  MUSCULOSKELETAL:  She has chronic joint pain, particularly right leg.  NEUROLOGIC:  No numbness, weakness, syncope.  PHYSICAL EXAMINATION:  VITAL SIGNS:  Temperature is 100.8, heart rate 120, blood pressure 124/58, respirations 18, O2 saturations are 96% on room air. GENERAL:  She is a morbidly obese African American female who appears uncomfortable. SKIN:  Warm and dry.  No rash or infection. HEENT:  No palpable mass or thyromegaly.  Sclerae nonicteric. Oropharynx clear. LYMPH NODES:  No cervical, supraclavicular, or inguinal  nodes palpable. LUNGS:  Clear without wheezing or increased work of breathing. BREASTS:  Status post reduction. ABDOMEN:  Healing midline incision.  There is no evidence of infection. Her abdomen is moderately tender, particularly in the epigastrium above the incision.  No discernible masses.  Very obese.  Bowel sounds are present and tend toward hyperactive. EXTREMITIES:  Trace lower extremity edema, well perfused, good pulses. NEUROLOGIC:  She is alert, fully oriented.  Motor and sensory exams are grossly  normal.  LABORATORY DATA/X-RAYS:  Urinalysis unremarkable.  White count 6.8, hemoglobin 11.5.  Lipase 27.  Electrolytes, BUN, creatinine, glucose are all within normal limits.  Chest x-ray and KUB showed no acute findings in the chest.  There are air-fluid levels in the upper abdomen, which appear to be mostly in the colon, consistent possibly with colitis or ileus, small bowel obstruction seems less likely.  ASSESSMENT/PLAN:  A 60 year old female with multiple medical problems, status post urgent ventral hernia repair.  She has persistent nausea, vomiting, diarrhea, and abdominal pain.  Her pain seems musculoskeletal and likely is incisional.  She may have persistent ileus.  C difficile colitis seems possible and we will rule this out.  She will be admitted, treated symptomatically with bowel rest and IV fluids.  She may need CT scan of the abdomen for further evaluation to rule out partial obstruction or abscess if she is not improved.     Lorne Skeens. Nishanth Mccaughan, M.D.     Tory Emerald  D:  12/05/2010  T:  12/05/2010  Job:  [id number]  Electronically Signed by [name] M.D. on 12/24/2010 09:49:14 AM

## 2010-12-25 LAB — IMMUNOFIXATION, URINE

## 2010-12-25 NOTE — Discharge Summary (Signed)
[name], KUNSMAN NO.:  [id number]  MEDICAL RECORD NO.:  [id number]  LOCATION:  1313                         FACILITY:  [facility]  PHYSICIAN:  Conley Canal, MD      DATE OF BIRTH:  01/27/1951  DATE OF ADMISSION:  12/05/2010 DATE OF DISCHARGE:                        DISCHARGE SUMMARY - REFERRING   PROBLEM LIST: 1. Acute kidney injury. 2. Right lower extremity deep vein thrombosis. 3. Methicillin-resistant staphylococcus aureus, abdominal wall abscess     status post drain placement. 4. History of tachyarrhythmias. 5. Gastroesophageal reflux disease. 6. Morbid obesity, body mass index greater than 40. 7. Borderline diabetes mellitus type 2. 8. History of multiple myeloma in remission. 9. Hypertension.  PROCEDURES PERFORMED: 1. CT abdomen and pelvis on December 06, 2010, showed interval repair of     ventral abdominal wall hernia and a 5.5 x 19.4 cm fluid collection     within the abdominal wall, subcutaneous tissues and small less than     1 cm calcified uterine fibroid. 2. Ultrasound-guided aspiration of the anterior abdominal wall     subcutaneous fluid collection with 500 mL fluid removed on December 07, 2010. 3. Chest x-ray on December 10, 2010, showed no acute cardiac abnormality. 4. Ultrasound of abdomen on December 11, 2010, showed some reaccumulation     of fluid in the anterior abdominal wall subcutaneous fat. 5. CT guided abscess drainage on December 12, 2010, by Dr. Tery Sanfilippo. 6. Renal ultrasound on December 16, 2010, showed no acute genitourinary     pathology, no hydronephrosis to suggest obstruction. 7. A 2-D echocardiogram on December 11, 2010, showed EF 60-65% with normal     wall motion.  The 2-D echocardiogram also showed changes suggesting     right ventricular volume and pressure overload and a peak atrial     pressure of 53 mmHg with moderate tricuspid regurgitation .  HOSPITAL COURSE:  Ms. Persad was admitted to the surgical service on December 05, 2010, with  complaints of nausea, vomiting, diarrhea, abdominal pain.  She had been discharged on December 03, 2010, after admission for small bowel obstruction secondary to incarcerated ventral hernia which was repaired on Nov 04, 2010.  Upon admission, the patient was found to have an anterior abdominal wall abscess with cultures growing MRSA. Initially she was placed on broad-spectrum antibiotics including vancomycin, ciprofloxacin, Flagyl and antibiotics were later de- escalated to clindamycin.  The patient was seen by the hospitalist service because of her history of tachyarrhythmias.  She did not have any arrhythmias witnessed in this admission and she was continued on atenolol; however, hospitalist service was asked to assume primary care on December 17, 2010, because of new medical issues including worsening renal failure and right leg DVT.  The patient was started on heparin and Coumadin for an acute DVT on the right leg.  Hypercoagulable workup was not pursued as the patient has risk factors including the morbid obesity and recent hospitalizations and surgery especially with anterior abdominal issues.  I asked Dr. Caryn Section to see to help with the patient's management regarding her acute renal failure.  She has had prior history thought to be related to multiple myeloma but when she  came in, her creatinine was 1.7.  Of note is that she was hypotensive for sometime and we appreciate Dr. Genevieve Norlander assistance.  She should continue Coumadin for 3 to 6 months.  PLAN: 1. Right leg deep vein thrombosis.  INR now therapeutic.  Pharmacy     input appreciated.  The patient is to continue Coumadin, target INR     2 to 3 for 3 to 6 months. 2. Acute renal failure.  Appreciate renal input.  Follow     recommendations. 3. History of tachyarrhythmias, none witnessed in this admission.     Continue atenolol. 4. Methicillin-resistant staphylococcus aureus, anterior abdominal     wall abscess status post drain management  per Surgery.  Appreciate     assistance. 5. Microcytic anemia, probably element of blood loss anemia.     Hemoglobin remained stable, monitor. 6. Morbid obesity, borderline diabetes mellitus, moderate tricuspid     regurgitation, moderate pulmonary hypertension; seems stable,     monitor.     Conley Canal, MD     SR/MEDQ  D:  12/18/2010  T:  12/18/2010  Job:  [id number]  Electronically Signed by Conley Canal  on 12/25/2010 05:06:40 PM

## 2010-12-25 NOTE — Discharge Summary (Signed)
[name], [name]:  [id number]  MEDICAL RECORD NO.:  [id number]  LOCATION:  1313                         FACILITY:  [facility]  PHYSICIAN:  Pleas Koch, MD        DATE OF BIRTH:  [birth year]  DATE OF ADMISSION:  12/05/2010 DATE OF DISCHARGE:                              DISCHARGE SUMMARY   This is a dictation covering the period of time mainly from December 18, 2010 until December 24, 2010; however, I will briefly summarize the patient's course up until this point.  PROBLEM LIST: 1. Acute kidney injury. 2. Right lower extremity deep venous thrombosis. 3. Methicillin-resistant Staphylococcus aureus. 4. Abdominal wall abscess, status post drain placement and removal on     December 21, 2010. 5. History of tachyarrhythmia. 6. Gastroesophageal reflux disease. 7. Super-morbid obesity, BMI of above 45. 8. 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 9. Hypertension. 10.Diabetes mellitus, moderate control. 11.Resolved metabolic acidosis.  DISCHARGE MEDICATIONS: 1. Zofran 8 mg 1 tablet q.8h. p.r.n. 2. Warfarin 5 mg one tablet daily, prescription for 7 prescribed.     Patient needs an INR of 1 in 4-5 days. 3. Bupropion XL 150 one tablet daily. 4. Atenolol 25 one tablet daily. 5. Amlodipine 5 mg one tablet daily. 6. Pravachol 40 mg one daily. 7. Aranesp 150 mcg every 7 days per Nephrology. 8. Rantac 150 one tablet b.i.d. 9. Lantus 5 units daily. 10.Lasix 40 mg every other day. 11.Multivitamin 1 tablet daily. 12.Hydrocodone/APAP 2.5 - 167/5. 13.Elixir 1 to 2 teaspoons q.4h. p.r.n. pain. 14.Aspirin 81 daily. 15.Potassium chloride 20 mEq 1 tablet daily.  Patient is to continue Velcade as per discretion of oncologist, Dr. Donnie Coffin.  PERTINENT RADIOLOGICAL AND IMAGING FINDINGS:  Acute abdominal series from December 05, 2010, showed air-fluid levels in the colon maybe due to diuresis and bowel obstruction, free air, subsequent cardiomegaly, subsequent  diffuse degenerative pain through thoracolumbar spine.   CT abdomen and pelvis from December 06, 2010 showed interval repair, ventral abdominal hernia, 5.5 x 19.4 fluid collection, abdominal wall subcutaneous tissues infected fluid collection cannot be excluded by imaging subsequent less than 1 cm calcified uterine fibroid subsequent small hiatal hernia.   Aspiration ultrasound showed aspiration 500 cc of          fluid on December 07, 2010, sample sent for Gram stain culture.   Chest x-ray two-view from December 10, 2010, no active cardiopulmonary abnormality.  CT repeat from December 12, 2010 showed successful lower abdominal subcutaneous abscess drainage.   CT of the abdomen and pelvis from December 21, 2010, showed resolution of large fluid connection and the subcutaneous fat in the anterior abdomen.    Echocardiogram done on December 11, 2010, showed LVH with an EF of 60% to 65%, wall motion normal.  No wall motion abnormalities.  Ventricular septum showed mild systolic flattening consistent with right volume pressure overload.  Tricuspid valve showedmoderate regurgitation.  Pulmonary arterial pressures were 53 mmHg, and this was consistent with moderate pulmonary hypertension.  HISTORY:  Briefly, please see admission H and P by Dr. Carmela Rima, job [phone] as well as consult note by Dr. Waymon Amato, [phone].  This is briefly a 60 year old female with  diabetes and other multiple medical issues, who is having progressive nausea, vomiting and discomfort from 2 to 3 months.  She noticed intractable nausea, vomiting, no coffee grounds with no passage of stool.  She was admitted on the Nov 12, 2010, and underwent an exploratory laparotomy with lysis of adhesions and repair of incarcerated ventral incisional hernia with MTF mesh by Dr. Abbey Chatters and was discharged on December 03, 2010, to follow up.  She returned on the December 05, 2010, with nausea, vomiting and abdominal pain and her postoperative course was marked by  some persistent ileus, which improved.  She complained of persistent nausea through the foods and has been vomiting twice daily, which is nonbilious, nonbloody.  She has had diarrhea, which is watery several times a day plus somewhat chills, but did not take temperature and she was seen in the Emergency Room and admitted by surgeon.  She was ruled out for C diff and had a CT abdomen, which is shown above. Subsequently, she started having renal insufficiency and hospitalist was asked to consult and eventually we did take over her management.  Please see Dr. Marcene Duos job [phone].  Patient was on broad-spectrum antibiotics including vanc, Cipro, Flagyl and these were de-escalated to clindamycin.  We were asked to consult because of tachyrhythmias and also new medical issues including worsening renal failure and right leg DVT.  HOSPITAL COURSE: 1. Right lower extremity DVT:  Patient's INR became therapeutic and     patient is currently on 5 mg Coumadin.  She will need to be     continued on the same.  She will need an INR probably in both 3     days to 1 week.  I have prescribed her 5 mg of Coumadin for the     next 7 days. 2. 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  I did     consult Dr. Donnie Coffin of oncology, who has recommended to restart her     Velcade, which has not happened as yet; however this can be done     safely, hopefully as an outpatient. 3. Abscess in abdomen now:  Patient received, what looks like 2 weeks     of antibiotics.  He is recently completing a 7-day course of     clindamycin on December 18, 2010.  Abscess resolved almost completely.     She was putting on less fluid and repeat CT on December 21, 2010, did     not show any fluid; however, the drain was  pulled.  She has had no     further complaints.  No pain.  No other issues other than a little     bit of nausea today. CT drainage.     Status post per drain on December 12, 2010, which grew up Enterobacter     She was transitioned off of this on December 18, 2010, after 7 days of clindamycin and normal white count.         She had low grade fevers; however, these have resolved.  Please see     above note for details. 4. Hypertension:  She was initially placed on Coreg and then in     addition  to her atenolol for reason unclear to me.  I have     discontinued her Coreg in favor of her once daily atenolol in view     of ease of use and she will continue on the same. 5. Diabetes mellitus:  She had an HbA1c, which was 6.0.  I think that     she could be reasonably well controlled on oral medication and in     fact I may just discharge her without medication and reconcile her     meds to no Lantus at all given the fact that she has had some load. 6. Anemia:  Patient has been placed on Aranesp for what looks like     anemia of chronic disease per oncology.  She has had normocytic     anemia in the 8-9 range and need to follow up with oncology and     hematology for the same. 7. CHF:  Per echo with mild LVH, which could be consistent with hypertension.  Patient will continue on Lasix every other day.  I     believe that her subcutaneous edema is secondary to being morbidly     obese, not necessarily heart failure. 8. Elevated peak pressures with moderate pulmonary hypertension:  I     believe this is secondary to her tricuspid regurg and she may     suffer from sleep apnea, which may be the primary component of     this.  It is recommended that she follow up with the sleep     specialist on discharge for further management. 9. Metabolic acidosis:  This was earlier in the admission and this has     since resolved. 10.  DISPOSITION:  Patient will be discharged home in the morning.  DISCHARGE  PHYSICAL EXAMINATION:  GENERAL:  She was doing well today, December 23, 2010. VITAL SIGNS:  She is afebrile with temperature of 97.4, pulse 93, respirations 16, blood pressure 96 to 101 over 66 to 57 satting 100% on room air.  Blood sugars are well controlled. CHEST:  Clear. HEART:  S1,S2.  No murmurs, rubs or gallops. ABDOMEN:  Soft, nontender, nondistended.  Area around the wound was normal. EXTREMITIES:  She had grade 1 to 2 lower extremity edema and is experiencing mild nausea, but no other issues.          ______________________________ Pleas Koch, MD     JS/MEDQ  D:  12/23/2010  T:  12/23/2010  Job:  [id number]  cc:   Sharlet Salina T. Hoxworth, M.D. [address], [address] [place] [address]  Wilmon Arms. Corliss Skains, M.D. [address] [place] [address] [facility]  Adolph Pollack, M.D. [address] [place] [address]  Terrial Rhodes, M.D. Fax: [phone]  Electronically Signed by Pleas Koch MD on 12/25/2010 07:59:29 PM

## 2010-12-25 NOTE — Discharge Summary (Signed)
[name], [name]:  [id number]  MEDICAL RECORD NO.:  [id number]  LOCATION:  1313                         FACILITY:  [facility]  PHYSICIAN:  Wilmon Arms. Corliss Skains, M.D. DATE OF BIRTH:  09/24/1950  DATE OF ADMISSION:  12/05/2010 DATE OF DISCHARGE:                              DISCHARGE SUMMARY   ADMITTING PHYSICIAN:  Sharlet Salina T. Hoxworth, M.D.  DATE OF DISCHARGE:  Unknown at this time.  CONSULTANTS: 1. Conley Canal, MD from Hospitalist. 2. Wilber Bihari. Caryn Section, M.D. from Nephrology.  PRIMARY SURGEON:  Adolph Pollack, M.D.  REASON FOR ADMISSION:  Ms. Stitt is a 60 year old black female who had recently been hospitalized from May 21 to June 11 secondary to a small- bowel obstruction.  This initially improved with nonoperative management.  However, as the patient began to take n.p.o. food, she developed more abdominal bloating and nausea and vomiting.  Therefore, she underwent exploratory laparotomy with ventral hernia repair and lysis of adhesions for small bowel obstruction by Dr. Abbey Chatters on May 31.  He did use MTF mesh to fit for ventral hernia.  She did fairly well postoperatively; however, she had a marked persistent ileus.  She was ultimately discharged on June 11.  After returning home, the patient began to develop nausea and vomiting once again.  She then developed persistent watery diarrhea.  She was brought back to the emergency department on the day of admission for further evaluation.  Please see admitting history and physical for further details.  ADMITTING DIAGNOSES: 1. Persistent nausea, vomiting and diarrhea with abdominal pain status     post exploratory laparotomy with lysis of adhesions and repair of     incarcerated ventral incisional hernia with MTF mesh. 2. Hypertension. 3. History of multiple myeloma. 4. Congestive heart failure. 5. Hypercholesterolemia. 6. Gastroesophageal reflux disease. 7. History of renal insufficiency. 8.  Borderline diabetes. 9. Morbid obesity.  HOSPITAL COURSE:  At this time, the patient was admitted.  She was initially made n.p.o. and started on IV fluids.  A Clostridium difficile colitis test was ordered and this was ruled out.  She ultimately had a CT scan of her abdomen and pelvis to evaluate for postoperative complications.  This revealed a large 5.5 x 19.4 cm abdominal wall fluid collection, essentially in the pocket where her hernia used to be.  The patient was initially started on antibiotics for this through her IV. Interventional radiology was asked to evaluate the patient for initially percutaneous aspiration.  This was ultimately aspirated, however, eventually did return back and the patient underwent a CT-guided drainage with placement of a percutaneous drain on December 12, 2010.  The patient ultimately was changed to vancomycin for this.  Her cultures ended up growing Enterobacter which was sensitive to clindamycin.  She was discontinued on her vancomycin and switched to IV clindamycin.  On June 26, after 7 days of clindamycin and a normal white blood cell count, this was discontinued.  During her hospitalization, the patient began having worsening renal function.  The hospitalists were consulted to help manage this.  By June 26, the patient's creatinine had elevated to 2.8.  Ultimately, the nephrologist has been called as well as to help with management  of this.  The patient also began complaining of some lower extremity swelling and discomfort.  An ultrasound Doppler study was done of her legs.  This did reveal an acute DVT.  The patient was ultimately placed on heparin and has now been started on Coumadin.  Discharge diagnoses are pending final discharge.  Discharge medications and discharge instructions will be given at time of actual discharge.  At this time, the patient will be transferred to the [facility] and we will continue to follow along for  management of the patient's drain care.  We appreciate the hospitalist's assistance.     Letha Cape, PA   ______________________________ Wilmon Arms. Corliss Skains, M.D.    KEO/MEDQ  D:  12/18/2010  T:  12/18/2010  Job:  [id number]  cc:   Adolph Pollack, M.D. [address] [place] [address]  Conley Canal, MD  Pleas Koch, MD  Electronically Signed by Barnetta Chapel PA on 12/20/2010 11:57:42 AM Electronically Signed by Manus Rudd M.D. on 12/25/2010 01:04:46 PM

## 2010-12-25 NOTE — Discharge Summary (Signed)
  NAMEARISA, CONGLETON NO.:  [id number]  MEDICAL RECORD NO.:  [id number]  LOCATION:  1313                         FACILITY:  [facility]  PHYSICIAN:  Pleas Koch, MD        DATE OF BIRTH:  February 28, 1951  DATE OF ADMISSION:  12/05/2010 DATE OF DISCHARGE:                              DISCHARGE SUMMARY   ADDENDUM:  DISCHARGE MEDICATIONS:  Updated discharge medications are: 1. Zofran 8 mg 1 tablet q.8 h. p.r.n. 2. Warfarin 5 mg once daily, prescription quantity sufficient for 7     days 7, tablets prescribed. 3. Bupropion XL 150 mg 1 tablet daily. 4. Atenolol 25 once daily. 5. Amlodipine 5 mg once in the morning. 6. Pravachol 40 one daily. 7. Rantac 150 b.i.d. 8. Aranesp 150 mcg subcutaneous 7 daily. 9. Lasix 40 mg every other day, 15 tablets prescribed. 10.Multivitamins 1 tablet daily. 11.Hydrocodone/APAP 1-2 teaspoons q.4 p.r.n. 12.Aspirin 81 mg daily. 13.Potassium chloride 1 tablet daily.  DISCHARGE CONDITION:  The patient is seen on the day of discharge, December 24, 2010, doing well.  No significant complaints.  Vitals on day of discharge are as follows:  Temperature 98.1, pulse 112, respirations 18, blood pressure 118-141 over 78-86, saturating 91% to 100% on room air. The patient is doing well.  No significant concerns or complaints.  Was tolerating p.o., is ambulant.  DISCHARGE FOLLOWUP:  The patient will follow up with primary care physician, Dr. Clelia Croft who needs to monitor her INR as an outpatient.  The patient also needs followup with primary oncologist, Dr. Clelia Croft.  She should call in for an appointment as well as Dr. Arrie Aran of Renal as she had renal insufficiency.          ______________________________ Pleas Koch, MD     JS/MEDQ  D:  12/24/2010  T:  12/24/2010  Job:  [id number]  cc:   Benjiman Core, M.D. Fax: [phone]  Terrial Rhodes, M.D. Fax: [phone]  Electronically Signed by Pleas Koch MD on 12/25/2010 07:59:44 PM

## 2010-12-25 NOTE — Discharge Summary (Signed)
[name], [name]:  [id number]  MEDICAL RECORD NO.:  [id number]  LOCATION:  1523                         FACILITY:  [facility]  PHYSICIAN:  Thornton Park. Daphine Deutscher, MD  DATE OF BIRTH:  10/13/1950  DATE OF ADMISSION:  11/12/2010 DATE OF DISCHARGE:  12/03/2010                              DISCHARGE SUMMARY   HISTORY OF PRESENT ILLNESS:  [name] is a pleasant 60 year old female with multiple medical problems including chronic renal insufficiency, hypertension, diabetes, hyperlipidemia, multiple myeloma who presented with nausea, vomiting, and abdominal pain.  She presented to the emergency department where CT scan revealed evidence of a bowel obstruction as well as a ventral hernia but no evidence of incarceration or strangulation of the hernia.  The patient was seen initially by the hospitalist service and subsequently admitted by Dr. Jamey Ripa, consulted on by the hospitalist service for ongoing management of medical problems.  The patient initially seem to show signs of improvement, good control of her medical problems were seen.  In fact it was felt that the patient perhaps will be close to ready for  discharge.  However, the patient reported worsening abdominal discomfort and distention.  A repeat CT enterography showed a transition point at the hernia with evidence of incarcerated hernia as the cause of obstruction.  Decision was made to take the patient to the operating room by Dr. Abbey Chatters.  The patient was taken to the operating room on Nov 22, 2010, and underwent exploratory laparotomy, lysis of adhesions, repair of ventral hernia with MTF mesh and removal of abdominal wall foreign body. Postoperatively the patient again had continued ileus which lasted several days.  Her medical issues again maintained well controlled on medication regimen.  She was consulted on by Physical Therapy and Occupational Therapy who felt the patient was appropriate for  home health therapy.  She did require TPN to supplement her nutrition during her n.p.o. and postoperative period as had an extended ileus.  However, eventually her bowels began returning function by means of passing flatus and eventually having multiple bowel movements.  Her NG tube was discontinued.  The patient was started on a p.o. diet, gradually advanced from liquids to solids.  By the time she got to solid diet, her TNA was discontinued.  She continued to progress well with Physical Therapy.  Arrangements have been made for home health physical therapy and at this time the patient feels appropriate for discharge home.  Both of her drains have been removed.  Her staples have been removed and her PICC line has been removed.  She has had no abdominal wound problems. She has no complicating factors such as pneumonia, DVT wound infection, urinary tract infection or otherwise.  The patient again felt appropriate for discharge home at present time.  DISCHARGE DIAGNOSES: 1. Small bowel obstruction secondary to incarcerated ventral hernia     status post repair. 2. Diabetes mellitus - stable. 3. Hypertension - stable. 4. Hyperlipidemia - stable. 5. Chronic renal insufficiency - stable.  DISCHARGE MEDICATIONS:  Include: 1. Artificial tears as needed. 2. Atenolol 25 mg 1 tablet daily. 3. Lortab elixir 1 to 2 teaspoons q.4 h p.r.n. 4. Lasix 40 mg 1/2 tablet daily. 5.  Amlodipine 5 mg 1 tablet daily. 6. Aspirin 81 mg 1 tablet daily. 7. Bupropion XL 150 mg daily. 8. Multivitamin once daily. 9. Oxybutynin 5 mg daily. 10.Potassium chloride 20 mEq 1 tablet daily. 11.Pravastatin 40 mg daily. 12.Ranitidine 150 mg twice daily. 13.Zofran 8 mg 1 tablet q.8 h p.r.n. nausea.  She is instructed to follow up with primary care office with I believe is [facility] for ongoing medical management.  She has followup with Dr. Abbey Chatters in approximately 2 to 3 weeks for followup post surgical issues.   She will have home health PT and OT set up as well as rolling walker, 3 in 1, for her use at home.     Brayton El, PA-C   ______________________________ Thornton Park Daphine Deutscher, MD    KB/MEDQ  D:  12/03/2010  T:  12/03/2010  Job:  [id number]  Electronically Signed by Brayton El  on 12/17/2010 02:35:54 PM Electronically Signed by Luretha Murphy MD on 12/25/2010 10:15:57 AM

## 2010-12-27 ENCOUNTER — Encounter: Payer: Medicaid Other | Admitting: Oncology

## 2010-12-27 LAB — MISCELLANEOUS TEST

## 2011-01-07 ENCOUNTER — Encounter (INDEPENDENT_AMBULATORY_CARE_PROVIDER_SITE_OTHER): Payer: Self-pay | Admitting: General Surgery

## 2011-01-08 ENCOUNTER — Other Ambulatory Visit: Payer: Self-pay | Admitting: Oncology

## 2011-01-08 ENCOUNTER — Encounter (HOSPITAL_BASED_OUTPATIENT_CLINIC_OR_DEPARTMENT_OTHER): Payer: Medicaid Other | Admitting: Oncology

## 2011-01-08 DIAGNOSIS — C9000 Multiple myeloma not having achieved remission: Secondary | ICD-10-CM

## 2011-01-08 DIAGNOSIS — N289 Disorder of kidney and ureter, unspecified: Secondary | ICD-10-CM

## 2011-01-08 DIAGNOSIS — Z86718 Personal history of other venous thrombosis and embolism: Secondary | ICD-10-CM

## 2011-01-08 DIAGNOSIS — Z7901 Long term (current) use of anticoagulants: Secondary | ICD-10-CM

## 2011-01-08 LAB — CBC WITH DIFFERENTIAL/PLATELET
BASO%: 0.4 % (ref 0.0–2.0)
EOS%: 2.5 % (ref 0.0–7.0)
LYMPH%: 27 % (ref 14.0–49.7)
MCHC: 33 g/dL (ref 31.5–36.0)
MCV: 83.9 fL (ref 79.5–101.0)
MONO%: 10.5 % (ref 0.0–14.0)
NEUT#: 2.9 10*3/uL (ref 1.5–6.5)
Platelets: 200 10*3/uL (ref 145–400)
RBC: 3.54 10*6/uL — ABNORMAL LOW (ref 3.70–5.45)
RDW: 23 % — ABNORMAL HIGH (ref 11.2–14.5)
WBC: 4.9 10*3/uL (ref 3.9–10.3)
nRBC: 0 % (ref 0–0)

## 2011-01-08 LAB — PROTHROMBIN TIME: Prothrombin Time: 13.8 seconds (ref 11.6–15.2)

## 2011-01-10 LAB — COMPREHENSIVE METABOLIC PANEL
ALT: 17 U/L (ref 0–35)
AST: 17 U/L (ref 0–37)
Alkaline Phosphatase: 98 U/L (ref 39–117)
Glucose, Bld: 81 mg/dL (ref 70–99)
Potassium: 4.9 mEq/L (ref 3.5–5.3)
Sodium: 136 mEq/L (ref 135–145)
Total Bilirubin: 0.5 mg/dL (ref 0.3–1.2)
Total Protein: 6.1 g/dL (ref 6.0–8.3)

## 2011-01-10 LAB — IMMUNOFIXATION ELECTROPHORESIS

## 2011-01-10 LAB — KAPPA/LAMBDA LIGHT CHAINS
Kappa free light chain: 922 mg/dL — ABNORMAL HIGH (ref 0.33–1.94)
Lambda Free Lght Chn: 1.33 mg/dL (ref 0.57–2.63)

## 2011-01-22 ENCOUNTER — Encounter: Payer: Medicaid Other | Admitting: Oncology

## 2011-01-22 ENCOUNTER — Other Ambulatory Visit: Payer: Self-pay | Admitting: Medical

## 2011-01-22 ENCOUNTER — Other Ambulatory Visit: Payer: Self-pay | Admitting: Oncology

## 2011-01-22 LAB — PROTHROMBIN TIME: INR: 3.54 — ABNORMAL HIGH (ref <1.50)

## 2011-01-22 LAB — PROTIME-INR

## 2011-01-24 ENCOUNTER — Encounter (INDEPENDENT_AMBULATORY_CARE_PROVIDER_SITE_OTHER): Payer: Self-pay | Admitting: General Surgery

## 2011-01-24 ENCOUNTER — Ambulatory Visit (INDEPENDENT_AMBULATORY_CARE_PROVIDER_SITE_OTHER): Payer: Medicaid Other | Admitting: General Surgery

## 2011-01-24 DIAGNOSIS — K430 Incisional hernia with obstruction, without gangrene: Secondary | ICD-10-CM

## 2011-01-24 NOTE — Progress Notes (Signed)
Operation:  Exploratory laparotomy with repair of incarcerated ventral hernia with MTF mesh.  Date:  Nov 21, 2010  Pathology:   na HPI:  She is here for her first postoperative visit. She'll prolonged postoperative course but now is home and living independently. She does not have much appetite. Does get nauseated at times. Bowels are moving.   Physical Exam:  Gen.-obese in no acute distress. Using a rolling walker.  Abdomen-slightly firm, incision clean, dry, intact, moderate amount of swelling around incision noted but no erythema.   Assessment:  Slow postop progress. Wound it looks good.  Plan:  Continued light activities. Try eating 6 small meals a day. Return visit 3-4 weeks.

## 2011-01-24 NOTE — Patient Instructions (Signed)
No lifting over 10 pounds. Eat 6 small meals a day.

## 2011-01-31 ENCOUNTER — Other Ambulatory Visit: Payer: Self-pay

## 2011-01-31 ENCOUNTER — Encounter (HOSPITAL_BASED_OUTPATIENT_CLINIC_OR_DEPARTMENT_OTHER): Payer: Self-pay | Admitting: *Deleted

## 2011-01-31 ENCOUNTER — Inpatient Hospital Stay (HOSPITAL_COMMUNITY)
Admission: AD | Admit: 2011-01-31 | Discharge: 2011-02-12 | DRG: 683 | Disposition: A | Discharge status: Home Health | Payer: Medicaid Other | Source: Other Acute Inpatient Hospital | Attending: Internal Medicine | Admitting: Internal Medicine

## 2011-01-31 ENCOUNTER — Emergency Department (INDEPENDENT_AMBULATORY_CARE_PROVIDER_SITE_OTHER): Payer: Medicaid Other

## 2011-01-31 ENCOUNTER — Emergency Department (HOSPITAL_BASED_OUTPATIENT_CLINIC_OR_DEPARTMENT_OTHER)
Admission: EM | Admit: 2011-01-31 | Discharge: 2011-01-31 | Disposition: A | Discharge status: Other Acute Inpatient Hospital | Payer: Medicaid Other | Source: Home / Self Care | Attending: Emergency Medicine | Admitting: Emergency Medicine

## 2011-01-31 DIAGNOSIS — R269 Unspecified abnormalities of gait and mobility: Secondary | ICD-10-CM | POA: Diagnosis present

## 2011-01-31 DIAGNOSIS — I129 Hypertensive chronic kidney disease with stage 1 through stage 4 chronic kidney disease, or unspecified chronic kidney disease: Secondary | ICD-10-CM | POA: Insufficient documentation

## 2011-01-31 DIAGNOSIS — N189 Chronic kidney disease, unspecified: Secondary | ICD-10-CM | POA: Insufficient documentation

## 2011-01-31 DIAGNOSIS — F3289 Other specified depressive episodes: Secondary | ICD-10-CM | POA: Diagnosis present

## 2011-01-31 DIAGNOSIS — F411 Generalized anxiety disorder: Secondary | ICD-10-CM | POA: Diagnosis present

## 2011-01-31 DIAGNOSIS — Z79899 Other long term (current) drug therapy: Secondary | ICD-10-CM | POA: Insufficient documentation

## 2011-01-31 DIAGNOSIS — L02219 Cutaneous abscess of trunk, unspecified: Secondary | ICD-10-CM | POA: Diagnosis present

## 2011-01-31 DIAGNOSIS — R110 Nausea: Secondary | ICD-10-CM

## 2011-01-31 DIAGNOSIS — E785 Hyperlipidemia, unspecified: Secondary | ICD-10-CM | POA: Diagnosis present

## 2011-01-31 DIAGNOSIS — Y839 Surgical procedure, unspecified as the cause of abnormal reaction of the patient, or of later complication, without mention of misadventure at the time of the procedure: Secondary | ICD-10-CM | POA: Diagnosis present

## 2011-01-31 DIAGNOSIS — M899 Disorder of bone, unspecified: Secondary | ICD-10-CM

## 2011-01-31 DIAGNOSIS — M7989 Other specified soft tissue disorders: Secondary | ICD-10-CM | POA: Insufficient documentation

## 2011-01-31 DIAGNOSIS — E875 Hyperkalemia: Secondary | ICD-10-CM | POA: Insufficient documentation

## 2011-01-31 DIAGNOSIS — E669 Obesity, unspecified: Secondary | ICD-10-CM | POA: Diagnosis present

## 2011-01-31 DIAGNOSIS — Z7982 Long term (current) use of aspirin: Secondary | ICD-10-CM

## 2011-01-31 DIAGNOSIS — E872 Acidosis, unspecified: Secondary | ICD-10-CM | POA: Diagnosis present

## 2011-01-31 DIAGNOSIS — E236 Other disorders of pituitary gland: Secondary | ICD-10-CM

## 2011-01-31 DIAGNOSIS — N179 Acute kidney failure, unspecified: Principal | ICD-10-CM | POA: Diagnosis present

## 2011-01-31 DIAGNOSIS — E119 Type 2 diabetes mellitus without complications: Secondary | ICD-10-CM | POA: Diagnosis present

## 2011-01-31 DIAGNOSIS — K219 Gastro-esophageal reflux disease without esophagitis: Secondary | ICD-10-CM | POA: Diagnosis present

## 2011-01-31 DIAGNOSIS — I10 Essential (primary) hypertension: Secondary | ICD-10-CM

## 2011-01-31 DIAGNOSIS — D61818 Other pancytopenia: Secondary | ICD-10-CM | POA: Diagnosis present

## 2011-01-31 DIAGNOSIS — I509 Heart failure, unspecified: Secondary | ICD-10-CM | POA: Diagnosis present

## 2011-01-31 DIAGNOSIS — R5383 Other fatigue: Secondary | ICD-10-CM

## 2011-01-31 DIAGNOSIS — T451X5A Adverse effect of antineoplastic and immunosuppressive drugs, initial encounter: Secondary | ICD-10-CM | POA: Diagnosis present

## 2011-01-31 DIAGNOSIS — T8140XA Infection following a procedure, unspecified, initial encounter: Secondary | ICD-10-CM | POA: Diagnosis present

## 2011-01-31 DIAGNOSIS — C9001 Multiple myeloma in remission: Secondary | ICD-10-CM | POA: Diagnosis present

## 2011-01-31 DIAGNOSIS — I959 Hypotension, unspecified: Secondary | ICD-10-CM | POA: Diagnosis not present

## 2011-01-31 DIAGNOSIS — E46 Unspecified protein-calorie malnutrition: Secondary | ICD-10-CM | POA: Diagnosis present

## 2011-01-31 DIAGNOSIS — Z7901 Long term (current) use of anticoagulants: Secondary | ICD-10-CM

## 2011-01-31 DIAGNOSIS — I5032 Chronic diastolic (congestive) heart failure: Secondary | ICD-10-CM | POA: Diagnosis present

## 2011-01-31 DIAGNOSIS — F329 Major depressive disorder, single episode, unspecified: Secondary | ICD-10-CM | POA: Diagnosis present

## 2011-01-31 DIAGNOSIS — Z86718 Personal history of other venous thrombosis and embolism: Secondary | ICD-10-CM

## 2011-01-31 HISTORY — DX: Anxiety disorder, unspecified: F41.9

## 2011-01-31 HISTORY — DX: Major depressive disorder, single episode, unspecified: F32.9

## 2011-01-31 HISTORY — DX: Depression, unspecified: F32.A

## 2011-01-31 LAB — BASIC METABOLIC PANEL
BUN: 72 mg/dL — ABNORMAL HIGH (ref 6–23)
BUN: 68 mg/dL — ABNORMAL HIGH (ref 6–23)
CO2: 13 mEq/L — ABNORMAL LOW (ref 19–32)
CO2: 13 mEq/L — ABNORMAL LOW (ref 19–32)
Chloride: 108 mEq/L (ref 96–112)
Creatinine, Ser: 5.25 mg/dL — ABNORMAL HIGH (ref 0.50–1.10)
GFR calc Af Amer: 10 mL/min — ABNORMAL LOW (ref >60)
GFR calc non Af Amer: 8 mL/min — ABNORMAL LOW (ref >60)
GFR calc non Af Amer: 8 mL/min — ABNORMAL LOW (ref >60)
Glucose, Bld: 95 mg/dL (ref 70–99)
Glucose, Bld: 108 mg/dL — ABNORMAL HIGH (ref 70–99)
Potassium: 6.5 mEq/L (ref 3.5–5.1)
Potassium: 6.2 mEq/L — ABNORMAL HIGH (ref 3.5–5.1)
Sodium: 137 mEq/L (ref 135–145)
Sodium: 135 mEq/L (ref 135–145)

## 2011-01-31 LAB — COMPREHENSIVE METABOLIC PANEL
ALT: 63 U/L — ABNORMAL HIGH (ref 0–35)
AST: 29 U/L (ref 0–37)
Alkaline Phosphatase: 228 U/L — ABNORMAL HIGH (ref 39–117)
CO2: 11 mEq/L — ABNORMAL LOW (ref 19–32)
Calcium: 9.1 mg/dL (ref 8.4–10.5)
Chloride: 103 mEq/L (ref 96–112)
GFR calc Af Amer: 9 mL/min — ABNORMAL LOW (ref >60)
GFR calc non Af Amer: 7 mL/min — ABNORMAL LOW (ref >60)
Glucose, Bld: 100 mg/dL — ABNORMAL HIGH (ref 70–99)
Potassium: 7.5 mEq/L (ref 3.5–5.1)
Sodium: 131 mEq/L — ABNORMAL LOW (ref 135–145)
Total Bilirubin: 0.4 mg/dL (ref 0.3–1.2)

## 2011-01-31 LAB — CBC
HCT: 24.9 % — ABNORMAL LOW (ref 36.0–46.0)
Hemoglobin: 8.5 g/dL — ABNORMAL LOW (ref 12.0–15.0)
MCH: 28.4 pg (ref 26.0–34.0)
MCHC: 34.1 g/dL (ref 30.0–36.0)
RBC: 2.99 MIL/uL — ABNORMAL LOW (ref 3.87–5.11)

## 2011-01-31 LAB — URINALYSIS, ROUTINE W REFLEX MICROSCOPIC
Bilirubin Urine: NEGATIVE
Glucose, UA: NEGATIVE mg/dL
Ketones, ur: NEGATIVE mg/dL
Leukocytes, UA: NEGATIVE
Specific Gravity, Urine: 1.016 (ref 1.005–1.030)
pH: 5.5 (ref 5.0–8.0)

## 2011-01-31 LAB — DIFFERENTIAL
Basophils Relative: 0 % (ref 0–1)
Eosinophils Absolute: 0.1 10*3/uL (ref 0.0–0.7)
Eosinophils Relative: 3 % (ref 0–5)
Lymphocytes Relative: 18 % (ref 12–46)
Neutro Abs: 1.7 10*3/uL (ref 1.7–7.7)

## 2011-01-31 LAB — GLUCOSE, CAPILLARY: Glucose-Capillary: 70 mg/dL (ref 70–99)

## 2011-01-31 MED ORDER — CALCIUM GLUCONATE 10 % IV SOLN
1.0000 g | Freq: Once | INTRAVENOUS | Status: AC
  Administered 2011-01-31: 1 g via INTRAVENOUS
  Filled 2011-01-31: qty 10

## 2011-01-31 MED ORDER — ONDANSETRON HCL 4 MG/2ML IJ SOLN
4.0000 mg | Freq: Once | INTRAMUSCULAR | Status: AC
  Administered 2011-01-31: 4 mg via INTRAVENOUS
  Filled 2011-01-31: qty 2

## 2011-01-31 MED ORDER — SODIUM BICARBONATE 8.4 % IV SOLN
25.0000 meq | Freq: Once | INTRAVENOUS | Status: AC
  Administered 2011-01-31: 25 meq via INTRAVENOUS
  Filled 2011-01-31: qty 50

## 2011-01-31 MED ORDER — SODIUM CHLORIDE 0.9 % IV BOLUS (SEPSIS)
1000.0000 mL | Freq: Once | INTRAVENOUS | Status: AC
  Administered 2011-01-31 (×2): 1000 mL via INTRAVENOUS

## 2011-01-31 MED ORDER — DEXTROSE 50 % IV SOLN
50.0000 mL | Freq: Once | INTRAVENOUS | Status: AC
  Administered 2011-01-31: 50 mL via INTRAVENOUS
  Filled 2011-01-31: qty 50

## 2011-01-31 MED ORDER — SODIUM POLYSTYRENE SULFONATE 15 GM/60ML PO SUSP
30.0000 g | Freq: Once | ORAL | Status: AC
  Administered 2011-01-31: 30 g via ORAL
  Filled 2011-01-31 (×2): qty 60

## 2011-01-31 MED ORDER — INSULIN REGULAR HUMAN 100 UNIT/ML IJ SOLN
5.0000 [IU] | Freq: Once | INTRAMUSCULAR | Status: AC
  Administered 2011-01-31: 5 [IU] via INTRAVENOUS
  Filled 2011-01-31: qty 12
  Filled 2011-01-31: qty 3

## 2011-01-31 MED ORDER — SODIUM CHLORIDE 0.9 % IV SOLN
Freq: Once | INTRAVENOUS | Status: AC
  Administered 2011-01-31 (×2): via INTRAVENOUS

## 2011-01-31 NOTE — ED Notes (Signed)
Patient states she had hernia repair in July.  States she is continuing to have pain at the site, saw dr. Abbey Chatters on 01/24/11 for same.  Patient states she is having nausea for 2 days and spitting up clear emesis.  States she feels like she is having increased swelling in her feet and legs causing gait problems.

## 2011-01-31 NOTE — ED Notes (Signed)
Patient states unable to ambulate, patient not willing to try except to stand once. I placed bed pan, patient urinated on self and bed. Nurse Dorene Ar. Helped assist me with patient to stand, then cleaned patient, then back to bed. Patient was unable to roll much to either side to assist [place].

## 2011-01-31 NOTE — ED Notes (Signed)
Call x 2 to give report to [phone].  Disconected x2.

## 2011-01-31 NOTE — ED Notes (Signed)
EMS reports that patient c/o nausea for 2 days with minimal clear vomiting.

## 2011-01-31 NOTE — ED Provider Notes (Signed)
History     CSN: [id number] Arrival date & time: 01/31/2011  2:44 PM  Chief Complaint  Patient presents with  . Nausea    x 2 days, soreness in her legs and problems walking   Patient is a 60 y.o. female presenting with neurologic complaint.  Neurologic Problem The primary symptoms include focal weakness and nausea. Primary symptoms do not include headaches, fever or vomiting. The symptoms began 12 to 24 hours ago. The symptoms are unchanged. The neurological symptoms are diffuse. Context: LE weakness and nause while ambulating.  Additional symptoms include weakness and loss of balance. Additional symptoms do not include neck stiffness or lower back pain. Medical issues do not include cerebral vascular accident.  USes a walker at home but having difficulty ambulating more so today, no recent illness, did have a recent ABD hernia repair but denies any medication changes or trauma or fall.   Past Medical History  Diagnosis Date  . Small bowel obstruction   . Diabetes mellitus   . Hypertension   . Hyperlipidemia   . Chronic renal insufficiency   . Multiple myeloma   . Abdominal pain   . Ventral hernia   . Congestive heart failure   . GERD (gastroesophageal reflux disease)   . Renal insufficiency   . Obesity   . Depression   . Anxiety     Past Surgical History  Procedure Date  . Hernia repair     ventral hernia  . Exploratory laparotomy   . Oophorectomy   . Breast surgery     reduction  . Panniculectomy     Family History  Problem Relation Age of Onset  . Heart disease Mother     History  Substance Use Topics  . Smoking status: Former Games developer  . Smokeless tobacco: Not on file  . Alcohol Use: No    OB History    Grav Para Term Preterm Abortions TAB SAB Ect Mult Living                  Review of Systems  Constitutional: Negative for fever and chills.  HENT: Negative for neck pain and neck stiffness.   Eyes: Negative for pain.  Respiratory: Negative for  shortness of breath.   Cardiovascular: Negative for chest pain.  Gastrointestinal: Positive for nausea. Negative for vomiting, abdominal pain and blood in stool.  Genitourinary: Negative for dysuria.  Musculoskeletal: Positive for gait problem. Negative for back pain.  Skin: Negative for rash and wound.  Neurological: Positive for focal weakness, weakness and loss of balance. Negative for headaches.  All other systems reviewed and are negative.    Physical Exam  BP 106/54  Pulse 74  Temp(Src) 98.6 F (37 C) (Oral)  Resp 18  Ht 5\' 10"  (1.778 m)  Wt 250 lb (113.399 kg)  BMI 35.87 kg/m2  SpO2 100%  Physical Exam  Constitutional: She is oriented to person, place, and time. She appears well-developed and well-nourished.  HENT:  Head: Normocephalic and atraumatic.  Eyes: Conjunctivae and EOM are normal. Pupils are equal, round, and reactive to light.  Neck: Trachea normal and full passive range of motion without pain. Neck supple. No thyromegaly present.       No meningismus  Cardiovascular: Normal rate, regular rhythm, S1 normal, S2 normal, intact distal pulses and normal pulses.     No systolic murmur is present   No diastolic murmur is present  Pulses:      Radial pulses are 2+ on the right  side, and 2+ on the left side.  Pulmonary/Chest: Effort normal and breath sounds normal. She has no wheezes. She has no rhonchi. She has no rales. She exhibits no tenderness.  Abdominal: Soft. Normal appearance and bowel sounds are normal. There is no tenderness. There is no CVA tenderness and negative Murphy's sign.  Musculoskeletal: Normal range of motion.       BLE:s Calves nontender, no cords or erythema, good bilateral dorsi flexion with mildly weak symetric plantar flexion  Neurological: She is alert and oriented to person, place, and time. She has normal strength and normal reflexes. No cranial nerve deficit or sensory deficit. She displays a negative Romberg sign. GCS eye subscore is 4.  GCS verbal subscore is 5. GCS motor subscore is 6.  Skin: Skin is warm and dry. No rash noted. She is not diaphoretic. No cyanosis. Nails show no clubbing.  Psychiatric: She has a normal mood and affect. Her speech is normal and behavior is normal.       Cooperative and appropriate    ED Course  Procedures   Date: 01/31/2011  Rate: 78  Rhythm: normal sinus rhythm  QRS Axis: normal  Intervals: normal  ST/T Wave abnormalities: nonspecific ST changes  Conduction Disutrbances:none  Narrative Interpretation:   Old EKG Reviewed: none available    MDM  ED eval and work up for nausea and weakness, found to have hyperkalemia, normal ECG, treated calcium/ sodium bicarb and insulin. Kayexalate provided. PT also has worsening renal insufficiency by labs. She is making good urine and denies any dec UOP. IVFS provided and MD c/s for admit. PT accepted in T[facility] to [facility] by accepting physician Dr Onalee Hua. Repeat BMP ordered and pending at 7:12pm   Results for orders placed during the hospital encounter of 01/31/11  URINALYSIS, ROUTINE W REFLEX MICROSCOPIC      Component Value Range   Color, Urine YELLOW  YELLOW    Appearance CLOUDY (*) CLEAR    Specific Gravity, Urine 1.016  1.005 - 1.030    pH 5.5  5.0 - 8.0    Glucose, UA NEGATIVE  NEGATIVE (mg/dL)   Hgb urine dipstick MODERATE (*) NEGATIVE    Bilirubin Urine NEGATIVE  NEGATIVE    Ketones, ur NEGATIVE  NEGATIVE (mg/dL)   Protein, ur 30 (*) NEGATIVE (mg/dL)   Urobilinogen, UA 0.2  0.0 - 1.0 (mg/dL)   Nitrite NEGATIVE  NEGATIVE    Leukocytes, UA NEGATIVE  NEGATIVE   CBC      Component Value Range   WBC 2.6 (*) 4.0 - 10.5 (K/uL)   RBC 2.99 (*) 3.87 - 5.11 (MIL/uL)   Hemoglobin 8.5 (*) 12.0 - 15.0 (g/dL)   HCT 16.1 (*) 09.6 - 46.0 (%)   MCV 83.3  78.0 - 100.0 (fL)   MCH 28.4  26.0 - 34.0 (pg)   MCHC 34.1  30.0 - 36.0 (g/dL)   RDW 04.5 (*) 40.9 - 15.5 (%)   Platelets 125 (*) 150 - 400 (K/uL)  DIFFERENTIAL      Component Value Range     Neutrophils Relative 66  43 - 77 (%)   Lymphocytes Relative 18  12 - 46 (%)   Monocytes Relative 13 (*) 3 - 12 (%)   Eosinophils Relative 3  0 - 5 (%)   Basophils Relative 0  0 - 1 (%)   Neutro Abs 1.7  1.7 - 7.7 (K/uL)   Lymphs Abs 0.5 (*) 0.7 - 4.0 (K/uL)   Monocytes Absolute 0.3  0.1 - 1.0 (K/uL)   Eosinophils Absolute 0.1  0.0 - 0.7 (K/uL)   Basophils Absolute 0.0  0.0 - 0.1 (K/uL)   RBC Morphology ELLIPTOCYTES     WBC Morphology TOXIC GRANULATION    COMPREHENSIVE METABOLIC PANEL      Component Value Range   Sodium 131 (*) 135 - 145 (mEq/L)   Potassium 7.5 (*) 3.5 - 5.1 (mEq/L)   Chloride 103  96 - 112 (mEq/L)   CO2 11 (*) 19 - 32 (mEq/L)   Glucose, Bld 100 (*) 70 - 99 (mg/dL)   BUN 73 (*) 6 - 23 (mg/dL)   Creatinine, Ser 4.09 (*) 0.50 - 1.10 (mg/dL)   Calcium 9.1  8.4 - 81.1 (mg/dL)   Total Protein 7.0  6.0 - 8.3 (g/dL)   Albumin 2.7 (*) 3.5 - 5.2 (g/dL)   AST 29  0 - 37 (U/L)   ALT 63 (*) 0 - 35 (U/L)   Alkaline Phosphatase 228 (*) 39 - 117 (U/L)   Total Bilirubin 0.4  0.3 - 1.2 (mg/dL)   GFR calc non Af Amer 7 (*) >60 (mL/min)   GFR calc Af Amer 9 (*) >60 (mL/min)  URINE MICROSCOPIC-ADD ON      Component Value Range   Squamous Epithelial / LPF FEW (*) RARE    RBC / HPF 7-10  <3 (RBC/hpf)   Bacteria, UA RARE  RARE    Dg Chest 2 View  01/31/2011  *RADIOLOGY REPORT*  Clinical Data: Weakness and nausea.  CHEST - 2 VIEW  Comparison: [facility] chest x-ray 12/10/2010.  Findings: Right IJ porta catheter stable in superior vena cava without pneumothorax.  Low lung volumes with clear lungs.  Normal heart size.  Mediastinum, hila, pleura appear normal.  Degenerative changes dorsal spine with interval expansile lytic lesion lateral left fifth rib and possible lytic lesions proximal right humerus.  IMPRESSION:  1.  Expansile lytic lesion lateral left fifth rib and possible subtle lytic lesions proximal right humerus.  Differential diagnosis includes expansile lytic osseous  metastatic lesions, multiple myeloma, fibrous dysplasia, Paget's disease.  Nuclear medicine total body bone scan may be contributory as clinically indicated and metastatic bone survey. 2.  No active cardiopulmonary disease.  Original Report Authenticated By: Barnie Del, M.D.   Ct Head Wo Contrast  01/31/2011  *RADIOLOGY REPORT*  Clinical Data: Nausea, 3 days leg swelling, hypertension.  CT HEAD WITHOUT CONTRAST  Technique:  Contiguous axial images were obtained from the base of the skull through the vertex without contrast.  Comparison: None.  Findings: Cerebrum, cerebral ventricles, brainstem, and cerebellum appear normal for age.  No acute intracerebral stroke/edema, acute intracerebral hemorrhage, tumor/mass effect/midline shift, acute extra-axial hemorrhage/collection visualized.  Findings suggesting partial or complete empty sella with sella measuring 14 mm AP X 15 mm wide.  Primarily sclerotic thickening of cortex and medullary bone left sphenoid and to left zygomatic arch and left temporal calvarium visualized.  Calvarium also demonstrates slight thickening and prominence of medullary pattern superiorly and visualized maxilla with no osseous pathologic fracture.  IMPRESSION:  1.  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 2.  Calvarial changes with differential diagnosis including Paget's disease, sickle cell anemia, and less likely metastatic disease. Consider total body nuclear medicine bone scan as clinically indicated. 3.  Otherwise, negative.  Original Report Authenticated By: Barnie Del, M.D.          Sunnie Nielsen, MD 01/31/11 [phone]

## 2011-01-31 NOTE — ED Notes (Signed)
I was able to collect enough urine from bed pan and transfer to sample bottle to complete urine order. I brought sample to lab.

## 2011-02-01 ENCOUNTER — Inpatient Hospital Stay (HOSPITAL_COMMUNITY): Payer: Medicaid Other

## 2011-02-01 DIAGNOSIS — N179 Acute kidney failure, unspecified: Secondary | ICD-10-CM

## 2011-02-01 DIAGNOSIS — R112 Nausea with vomiting, unspecified: Secondary | ICD-10-CM

## 2011-02-01 DIAGNOSIS — C9000 Multiple myeloma not having achieved remission: Secondary | ICD-10-CM

## 2011-02-01 DIAGNOSIS — T8140XA Infection following a procedure, unspecified, initial encounter: Secondary | ICD-10-CM

## 2011-02-01 DIAGNOSIS — R197 Diarrhea, unspecified: Secondary | ICD-10-CM

## 2011-02-01 LAB — BASIC METABOLIC PANEL
BUN: 63 mg/dL — ABNORMAL HIGH (ref 6–23)
CO2: 14 mEq/L — ABNORMAL LOW (ref 19–32)
Calcium: 8.5 mg/dL (ref 8.4–10.5)
Chloride: 115 mEq/L — ABNORMAL HIGH (ref 96–112)
Creatinine, Ser: 5.17 mg/dL — ABNORMAL HIGH (ref 0.50–1.10)

## 2011-02-01 LAB — CBC
Hemoglobin: 7.3 g/dL — ABNORMAL LOW (ref 12.0–15.0)
Platelets: 120 10*3/uL — ABNORMAL LOW (ref 150–400)
RBC: 2.68 MIL/uL — ABNORMAL LOW (ref 3.87–5.11)

## 2011-02-01 LAB — CARDIAC PANEL(CRET KIN+CKTOT+MB+TROPI)
CK, MB: 2.2 ng/mL (ref 0.3–4.0)
CK, MB: 1.9 ng/mL (ref 0.3–4.0)
Relative Index: 2 (ref 0.0–2.5)
Total CK: 111 U/L (ref 7–177)

## 2011-02-01 LAB — GLUCOSE, CAPILLARY: Glucose-Capillary: 139 mg/dL — ABNORMAL HIGH (ref 70–99)

## 2011-02-01 LAB — MAGNESIUM: Magnesium: 1.9 mg/dL (ref 1.5–2.5)

## 2011-02-01 LAB — TSH: TSH: 0.46 u[IU]/mL (ref 0.350–4.500)

## 2011-02-01 LAB — HEMOGLOBIN A1C
Hgb A1c MFr Bld: 6 % — ABNORMAL HIGH (ref <5.7)
Mean Plasma Glucose: 126 mg/dL — ABNORMAL HIGH (ref <117)

## 2011-02-01 LAB — PRO B NATRIURETIC PEPTIDE: Pro B Natriuretic peptide (BNP): 1158 pg/mL — ABNORMAL HIGH (ref 0–125)

## 2011-02-02 ENCOUNTER — Inpatient Hospital Stay (HOSPITAL_COMMUNITY): Payer: Medicaid Other

## 2011-02-02 LAB — CBC
Hemoglobin: 8.4 g/dL — ABNORMAL LOW (ref 12.0–15.0)
MCH: 28 pg (ref 26.0–34.0)
MCHC: 33.3 g/dL (ref 30.0–36.0)
MCV: 83.3 fL (ref 78.0–100.0)
Platelets: 125 10*3/uL — ABNORMAL LOW (ref 150–400)
Platelets: 117 10*3/uL — ABNORMAL LOW (ref 150–400)
RBC: 2.33 MIL/uL — ABNORMAL LOW (ref 3.87–5.11)
RDW: 19.6 % — ABNORMAL HIGH (ref 11.5–15.5)
WBC: 1.9 10*3/uL — ABNORMAL LOW (ref 4.0–10.5)

## 2011-02-02 LAB — DIFFERENTIAL
Basophils Absolute: 0.1 10*3/uL (ref 0.0–0.1)
Eosinophils Absolute: 0.3 10*3/uL (ref 0.0–0.7)
Lymphocytes Relative: 23 % (ref 12–46)
Monocytes Relative: 10 % (ref 3–12)
Neutrophils Relative %: 49 % (ref 43–77)

## 2011-02-02 LAB — GLUCOSE, CAPILLARY
Glucose-Capillary: 97 mg/dL (ref 70–99)
Glucose-Capillary: 114 mg/dL — ABNORMAL HIGH (ref 70–99)
Glucose-Capillary: 82 mg/dL (ref 70–99)
Glucose-Capillary: 92 mg/dL (ref 70–99)

## 2011-02-02 LAB — PROTIME-INR
INR: 2.59 — ABNORMAL HIGH (ref 0.00–1.49)
Prothrombin Time: 28.2 seconds — ABNORMAL HIGH (ref 11.6–15.2)

## 2011-02-02 LAB — APTT: aPTT: 106 seconds — ABNORMAL HIGH (ref 24–37)

## 2011-02-02 LAB — HEPARIN ANTI-XA: Heparin LMW: 0.21 IU/mL

## 2011-02-02 LAB — BASIC METABOLIC PANEL
CO2: 18 mEq/L — ABNORMAL LOW (ref 19–32)
Calcium: 7.6 mg/dL — ABNORMAL LOW (ref 8.4–10.5)
Creatinine, Ser: 3.95 mg/dL — ABNORMAL HIGH (ref 0.50–1.10)
GFR calc non Af Amer: 12 mL/min — ABNORMAL LOW (ref >60)
Sodium: 143 mEq/L (ref 135–145)

## 2011-02-02 LAB — ABO/RH: ABO/RH(D): O POS

## 2011-02-03 LAB — CROSSMATCH: ABO/RH(D): O POS

## 2011-02-03 LAB — DIFFERENTIAL
Basophils Absolute: 0.1 10*3/uL (ref 0.0–0.1)
Basophils Relative: 5 % — ABNORMAL HIGH (ref 0–1)
Eosinophils Absolute: 0.2 10*3/uL (ref 0.0–0.7)
Lymphocytes Relative: 29 % (ref 12–46)
Monocytes Absolute: 0.2 10*3/uL (ref 0.1–1.0)
Neutrophils Relative %: 46 % (ref 43–77)

## 2011-02-03 LAB — BASIC METABOLIC PANEL
CO2: 21 mEq/L (ref 19–32)
Calcium: 7.5 mg/dL — ABNORMAL LOW (ref 8.4–10.5)
Creatinine, Ser: 3.62 mg/dL — ABNORMAL HIGH (ref 0.50–1.10)
GFR calc non Af Amer: 13 mL/min — ABNORMAL LOW (ref >60)
Sodium: 145 mEq/L (ref 135–145)

## 2011-02-03 LAB — CBC
MCHC: 33.3 g/dL (ref 30.0–36.0)
Platelets: 120 10*3/uL — ABNORMAL LOW (ref 150–400)
RDW: 18 % — ABNORMAL HIGH (ref 11.5–15.5)
WBC: 2.2 10*3/uL — ABNORMAL LOW (ref 4.0–10.5)

## 2011-02-03 LAB — PROTIME-INR
INR: 2.45 — ABNORMAL HIGH (ref 0.00–1.49)
Prothrombin Time: 27 seconds — ABNORMAL HIGH (ref 11.6–15.2)

## 2011-02-03 LAB — GLUCOSE, CAPILLARY
Glucose-Capillary: 151 mg/dL — ABNORMAL HIGH (ref 70–99)
Glucose-Capillary: 106 mg/dL — ABNORMAL HIGH (ref 70–99)

## 2011-02-04 DIAGNOSIS — N179 Acute kidney failure, unspecified: Secondary | ICD-10-CM

## 2011-02-04 DIAGNOSIS — C9000 Multiple myeloma not having achieved remission: Secondary | ICD-10-CM

## 2011-02-04 LAB — BASIC METABOLIC PANEL
BUN: 37 mg/dL — ABNORMAL HIGH (ref 6–23)
CO2: 22 mEq/L (ref 19–32)
Calcium: 7.6 mg/dL — ABNORMAL LOW (ref 8.4–10.5)
Chloride: 112 mEq/L (ref 96–112)
Creatinine, Ser: 3.54 mg/dL — ABNORMAL HIGH (ref 0.50–1.10)

## 2011-02-04 LAB — WOUND CULTURE: Gram Stain: NONE SEEN

## 2011-02-04 LAB — CBC
HCT: 25.9 % — ABNORMAL LOW (ref 36.0–46.0)
MCH: 27.5 pg (ref 26.0–34.0)
MCV: 84.9 fL (ref 78.0–100.0)
RBC: 3.05 MIL/uL — ABNORMAL LOW (ref 3.87–5.11)
WBC: 2.3 10*3/uL — ABNORMAL LOW (ref 4.0–10.5)

## 2011-02-04 LAB — GLUCOSE, CAPILLARY
Glucose-Capillary: 102 mg/dL — ABNORMAL HIGH (ref 70–99)
Glucose-Capillary: 99 mg/dL (ref 70–99)
Glucose-Capillary: 116 mg/dL — ABNORMAL HIGH (ref 70–99)
Glucose-Capillary: 103 mg/dL — ABNORMAL HIGH (ref 70–99)

## 2011-02-04 LAB — DIFFERENTIAL
Basophils Relative: 6 % — ABNORMAL HIGH (ref 0–1)
Eosinophils Relative: 7 % — ABNORMAL HIGH (ref 0–5)
Lymphocytes Relative: 34 % (ref 12–46)
Monocytes Absolute: 0.3 10*3/uL (ref 0.1–1.0)
Neutro Abs: 0.9 10*3/uL — ABNORMAL LOW (ref 1.7–7.7)
Neutrophils Relative %: 41 % — ABNORMAL LOW (ref 43–77)

## 2011-02-05 LAB — GLUCOSE, CAPILLARY
Glucose-Capillary: 66 mg/dL — ABNORMAL LOW (ref 70–99)
Glucose-Capillary: 102 mg/dL — ABNORMAL HIGH (ref 70–99)
Glucose-Capillary: 99 mg/dL (ref 70–99)

## 2011-02-05 LAB — BASIC METABOLIC PANEL
Calcium: 7.1 mg/dL — ABNORMAL LOW (ref 8.4–10.5)
GFR calc non Af Amer: 15 mL/min — ABNORMAL LOW (ref >60)
Glucose, Bld: 73 mg/dL (ref 70–99)
Sodium: 142 mEq/L (ref 135–145)

## 2011-02-05 LAB — CBC
Hemoglobin: 7.8 g/dL — ABNORMAL LOW (ref 12.0–15.0)
MCH: 28.1 pg (ref 26.0–34.0)
MCHC: 32.5 g/dL (ref 30.0–36.0)
RDW: 18.6 % — ABNORMAL HIGH (ref 11.5–15.5)

## 2011-02-05 LAB — PATHOLOGIST SMEAR REVIEW

## 2011-02-05 LAB — DIFFERENTIAL
Basophils Absolute: 0.1 10*3/uL (ref 0.0–0.1)
Eosinophils Absolute: 0.2 10*3/uL (ref 0.0–0.7)
Lymphs Abs: 0.8 10*3/uL (ref 0.7–4.0)
Monocytes Absolute: 0.3 10*3/uL (ref 0.1–1.0)
Neutro Abs: 0.6 10*3/uL — ABNORMAL LOW (ref 1.7–7.7)

## 2011-02-05 LAB — PROTIME-INR: Prothrombin Time: 31.3 seconds — ABNORMAL HIGH (ref 11.6–15.2)

## 2011-02-06 LAB — DIFFERENTIAL
Blasts: 0 %
Eosinophils Absolute: 0.1 10*3/uL (ref 0.0–0.7)
Eosinophils Relative: 4 % (ref 0–5)
Lymphocytes Relative: 39 % (ref 12–46)
Lymphs Abs: 0.8 10*3/uL (ref 0.7–4.0)
Metamyelocytes Relative: 0 %
Monocytes Absolute: 0.3 10*3/uL (ref 0.1–1.0)
Monocytes Relative: 14 % — ABNORMAL HIGH (ref 3–12)
nRBC: 0 /100 WBC

## 2011-02-06 LAB — COMPREHENSIVE METABOLIC PANEL
ALT: 22 U/L (ref 0–35)
BUN: 26 mg/dL — ABNORMAL HIGH (ref 6–23)
CO2: 21 mEq/L (ref 19–32)
Calcium: 7.4 mg/dL — ABNORMAL LOW (ref 8.4–10.5)
Creatinine, Ser: 2.79 mg/dL — ABNORMAL HIGH (ref 0.50–1.10)
GFR calc Af Amer: 21 mL/min — ABNORMAL LOW (ref >60)
GFR calc non Af Amer: 17 mL/min — ABNORMAL LOW (ref >60)
Glucose, Bld: 96 mg/dL (ref 70–99)
Total Protein: 5.4 g/dL — ABNORMAL LOW (ref 6.0–8.3)

## 2011-02-06 LAB — CBC
HCT: 24.9 % — ABNORMAL LOW (ref 36.0–46.0)
MCH: 28.1 pg (ref 26.0–34.0)
MCHC: 32.5 g/dL (ref 30.0–36.0)
MCV: 86.5 fL (ref 78.0–100.0)
Platelets: 159 10*3/uL (ref 150–400)
RDW: 18.4 % — ABNORMAL HIGH (ref 11.5–15.5)

## 2011-02-06 LAB — GLUCOSE, CAPILLARY
Glucose-Capillary: 87 mg/dL (ref 70–99)
Glucose-Capillary: 80 mg/dL (ref 70–99)
Glucose-Capillary: 107 mg/dL — ABNORMAL HIGH (ref 70–99)

## 2011-02-07 LAB — GLUCOSE, CAPILLARY
Glucose-Capillary: 95 mg/dL (ref 70–99)
Glucose-Capillary: 72 mg/dL (ref 70–99)

## 2011-02-07 LAB — CBC
HCT: 26.1 % — ABNORMAL LOW (ref 36.0–46.0)
RBC: 3.02 MIL/uL — ABNORMAL LOW (ref 3.87–5.11)
RDW: 18.6 % — ABNORMAL HIGH (ref 11.5–15.5)
WBC: 2.6 10*3/uL — ABNORMAL LOW (ref 4.0–10.5)

## 2011-02-07 LAB — PROTIME-INR: INR: 2.47 — ABNORMAL HIGH (ref 0.00–1.49)

## 2011-02-07 LAB — BASIC METABOLIC PANEL
CO2: 20 mEq/L (ref 19–32)
Calcium: 7.8 mg/dL — ABNORMAL LOW (ref 8.4–10.5)
GFR calc non Af Amer: 19 mL/min — ABNORMAL LOW (ref >60)
Potassium: 3.7 mEq/L (ref 3.5–5.1)
Sodium: 141 mEq/L (ref 135–145)

## 2011-02-07 NOTE — Progress Notes (Signed)
[name], [name]:  [id number]  MEDICAL RECORD NO.:  [id number]  LOCATION:  6703                         FACILITY:  [facility]  PHYSICIAN:  Sara Scott, MD     DATE OF BIRTH:  1951-02-02                                PROGRESS NOTE   ONCOLOGIST: Sara Nicely. Clelia Croft, MD.  NEPHROLOGIST: Terrial Rhodes, MD.  CURRENT DIAGNOSES: 1. Acute on chronic kidney disease. 2. Anion gap metabolic acidosis, resolved. 3. Hyperkalemia, resolved. 4. Pancytopenia secondary to Revlimid.  Revlimid discontinued. 5. Anterior abdominal wall/surgical site abscess. 6. History of deep vein thrombosis, anticoagulated on Coumadin. 7. History of chronic diastolic congestive heart failure, compensated. 8. Hypertension. 9. Multiple myeloma. 10.Hypotension.  DISCHARGE MEDICATIONS: Discharge medications are to be dictated by discharging physician.  IMAGING STUDIES: 1. CT of the abdomen and pelvis without contrast on February 02, 2011. Impression,A.  Percutaneous drainage catheter removal, with reaccumulation of fluid and gas in the deep anterior abdominal wall subcutaneous collection. B.  No acute intra-abdominal process. 1. Abdominal x-ray on February 01, 2011.  Impression, nonspecific bowel     gas pattern.  Majority of the bowel gas appears to be within the     colon.  There may be a few dilated loops of small bowel. 2. Renal ultrasound on February 01, 2011.  Impression, no explanation     for the patient's acute renal failure, specifically no evidence of     urinary obstruction. 3. Chest x-ray on January 31, 2011. Impression, A.  Expansile lytic lesion lateral 5th rib and possible subtle lytic lesion proximal right humerus. B.  No active cardiopulmonary disease. 1. CT of the head without contrast on January 31, 2011. Impression, A.  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 B.  Calvarial changes with differential diagnosis including Paget's disease, sickle-cell  anemia, and less likely metastatic disease. C.  Otherwise negative.  LABORATORY DATA: Peripheral smear shows absolute neutropenia with toxic granulation, moderate anemia with rouleaux formation.  CBC today shows hemoglobin 7.8, hematocrit 24, white blood cell 2, and platelets 140,000.  Basic metabolic panel significant for chloride 114, BUN 29, and creatinine 3.18.  INR is 2.96.  Wound culture shows abundant methicillin-resistant Staphylococcus aureus which is resistant to Levaquin, oxacillin, penicillin, and Bactrim; and sensitive to tetracycline, vancomycin, rifampin, gentamicin, erythromycin, and clindamycin.  Hemoglobin A1c is 6.  TSH is 0.460.  Cardiac enzymes were cycled and negative.  BNP was 1158.  Urinalysis was negative for features of urinary tract infection. Admitting BMET shows sodium 131, potassium 7.5, chloride 103, bicarbonate 11, glucose 100, BUN 73, creatinine 5.80, alkaline phosphatase 228, ALT 63, and albumin 2.7.  CONSULTATIONS: 1. Nephrology, [name]. Sara Guitar, MD, of [facility]. 2. General Surgery, [facility]. 3. Oncology, Dr. Blenda Nicely. Clelia Croft, MD.  TODAY'S COMPLAINTS: The patient denies any complaints.  REVIEW OF SYSTEMS: The patient indicates that she continues to drain from the lower anterior abdominal wall wound, but there is no pain.  She says she ambulated with the help of physical therapy.  PHYSICAL EXAMINATION: GENERAL:  The patient is in no obvious distress. VITAL SIGNS:  Temperature 97.6 degrees Fahrenheit, pulse 83 per minute, respiratory rate 17 per  minute, blood pressure 113/72 mmHg, and saturating at 100% on room air.  CBGs range between 66-102 mg/dL, so 66 was last night. RESPIRATORY:  Clear. CARDIOVASCULAR:  First and second heart sounds heard regular.  No JVD. ABDOMEN:  Nondistended.  The patient has an approximately 0.5-cm open wound at the lower part of the surgical healed wound which is draining some clear  yellow colored material.  Otherwise no other acute signs. The dressing is mildly soaked.  Bowel sounds normally heard. CENTRAL NERVOUS SYSTEM:  The patient is awake, alert, and oriented x3 with no focal neurological deficit.  HOSPITAL COURSE: [name] is a 60 year old African American female.  Patient with a history of multiple myeloma, deep vein thrombosis of the right lower extremity, hypertension, type 2 diabetes mellitus, recent ventral hernia repair with mesh procedure, hypertension, and chronic diastolic congestive heart failure, who presented with nausea, vomiting, and weakness.  She presented to the [facility] ED where she was found to be in acute renal failure with hyperkalemia and metabolic acidosis.  She was transferred to the [facility] for further evaluation and management.  1. Acute on chronic kidney disease.  The patient's baseline creatinine     is 2.3.  She was admitted to the hospital.  Her renal failure was     hemodynamically mediated from GI losses plus or minus infection.     Her potassium chloride and Lasix were obviously discontinued.  She     was placed on a bicarbonate drip.  Obstruction was ruled out.  Nephrology was consulted.  Her blood pressure medications were also     discontinued secondary to soft blood pressure.  With these     measures, the patient progressively improved.  Her creatinine is     not yet at baseline.  Revlimid was also stopped.  We will monitor     her creatinine for the next 2 days. 2. Anion gap metabolic acidosis secondary to acute on chronic kidney     disease, resolved and her bicarbonate drip was discontinued. 3. Hyperkalemia secondary to acute renal failure, resolved. 4. Pancytopenia secondary to Revlimid.  Oncology was consulted.  They     recommended discontinuing the Revlimid.  She will start Velcade as     an outpatient.  She was transfused 2 units of packed red blood     cells for hemoglobin and the mid 6 g/dL  range.  She is to be     transfused again if her hemoglobin drops to less than 7 g/dL. 5. Surgical site lower anterior abdominal wall MRSA abscess.  Surgeons     were consulted.  They performed CT.  They indicated that since the     wound was already draining by itself, they did not recommend any     surgical intervention or placing another drain.  They recommended     continuing the doxycycline and packing the wound with dressing     b.i.d.  She is to follow up with Dr. Abbey Chatters in 1-2 weeks from     discharge. 6. History of deep vein thrombosis.  The patient is at high risk for     repeat DVT.  She was anticoagulated and is therapeutic. 7. History of congestive heart failure.  The patient is compensated,     but needs to be closely monitored while she is still on the IV     fluids. 8. Hypertension.  Her blood pressure medications were discontinued     secondary to  hypotensive episodes. 9. Type 2 diabetes mellitus well controlled based on her hemoglobin     A1c.  She had an episode of hypoglycemia and hence her CBGs will     need to be closely monitored.  She has not required any significant     amounts of sliding scale insulin which will be discontinued.  DISPOSITION: The patient is not yet stable for discharge, but if her creatinine continues to improve, she should be stable for discharge in approximately 48 hours.  Physical therapy, occupational therapy recommend skilled nursing facility and the patient is choosing [facility].     Sara Scott, MD     AH/MEDQ  D:  02/05/2011  T:  02/05/2011  Job:  [id number]  Electronically Signed by Sara Scott MD on 02/07/2011 12:18:46 AM

## 2011-02-08 LAB — BASIC METABOLIC PANEL
Calcium: 7.9 mg/dL — ABNORMAL LOW (ref 8.4–10.5)
Creatinine, Ser: 2.4 mg/dL — ABNORMAL HIGH (ref 0.50–1.10)
GFR calc Af Amer: 25 mL/min — ABNORMAL LOW (ref >60)
GFR calc non Af Amer: 21 mL/min — ABNORMAL LOW (ref >60)

## 2011-02-08 LAB — GLUCOSE, CAPILLARY: Glucose-Capillary: 78 mg/dL (ref 70–99)

## 2011-02-08 LAB — CBC
MCHC: 32 g/dL (ref 30.0–36.0)
RDW: 18.6 % — ABNORMAL HIGH (ref 11.5–15.5)

## 2011-02-08 LAB — PROTIME-INR
INR: 2.23 — ABNORMAL HIGH (ref 0.00–1.49)
Prothrombin Time: 25.1 seconds — ABNORMAL HIGH (ref 11.6–15.2)

## 2011-02-09 LAB — CBC
HCT: 25.2 % — ABNORMAL LOW (ref 36.0–46.0)
MCV: 86 fL (ref 78.0–100.0)
Platelets: 218 10*3/uL (ref 150–400)
RBC: 2.93 MIL/uL — ABNORMAL LOW (ref 3.87–5.11)
WBC: 2.6 10*3/uL — ABNORMAL LOW (ref 4.0–10.5)

## 2011-02-09 LAB — GLUCOSE, CAPILLARY: Glucose-Capillary: 90 mg/dL (ref 70–99)

## 2011-02-10 LAB — PROTIME-INR
INR: 1.86 — ABNORMAL HIGH (ref 0.00–1.49)
Prothrombin Time: 21.8 seconds — ABNORMAL HIGH (ref 11.6–15.2)

## 2011-02-10 LAB — CBC
HCT: 27.4 % — ABNORMAL LOW (ref 36.0–46.0)
Hemoglobin: 9 g/dL — ABNORMAL LOW (ref 12.0–15.0)
MCV: 85.9 fL (ref 78.0–100.0)
WBC: 3.1 10*3/uL — ABNORMAL LOW (ref 4.0–10.5)

## 2011-02-10 LAB — GLUCOSE, CAPILLARY: Glucose-Capillary: 117 mg/dL — ABNORMAL HIGH (ref 70–99)

## 2011-02-11 ENCOUNTER — Encounter (INDEPENDENT_AMBULATORY_CARE_PROVIDER_SITE_OTHER): Payer: Self-pay | Admitting: General Surgery

## 2011-02-11 LAB — GLUCOSE, CAPILLARY: Glucose-Capillary: 94 mg/dL (ref 70–99)

## 2011-02-11 LAB — CBC
Platelets: 231 10*3/uL (ref 150–400)
RBC: 3.27 MIL/uL — ABNORMAL LOW (ref 3.87–5.11)
RDW: 18.5 % — ABNORMAL HIGH (ref 11.5–15.5)
WBC: 3.6 10*3/uL — ABNORMAL LOW (ref 4.0–10.5)

## 2011-02-12 LAB — CBC
HCT: 27.8 % — ABNORMAL LOW (ref 36.0–46.0)
Hemoglobin: 9.1 g/dL — ABNORMAL LOW (ref 12.0–15.0)
MCH: 27.7 pg (ref 26.0–34.0)
MCHC: 32.7 g/dL (ref 30.0–36.0)
MCV: 84.8 fL (ref 78.0–100.0)

## 2011-02-12 LAB — PROTIME-INR: INR: 1.83 — ABNORMAL HIGH (ref 0.00–1.49)

## 2011-02-13 ENCOUNTER — Encounter (INDEPENDENT_AMBULATORY_CARE_PROVIDER_SITE_OTHER): Payer: Medicaid Other | Admitting: General Surgery

## 2011-02-19 ENCOUNTER — Encounter (INDEPENDENT_AMBULATORY_CARE_PROVIDER_SITE_OTHER): Payer: Self-pay | Admitting: General Surgery

## 2011-02-19 ENCOUNTER — Ambulatory Visit (INDEPENDENT_AMBULATORY_CARE_PROVIDER_SITE_OTHER): Payer: Medicaid Other | Admitting: General Surgery

## 2011-02-19 DIAGNOSIS — K436 Other and unspecified ventral hernia with obstruction, without gangrene: Secondary | ICD-10-CM | POA: Insufficient documentation

## 2011-02-19 DIAGNOSIS — K4020 Bilateral inguinal hernia, without obstruction or gangrene, not specified as recurrent: Secondary | ICD-10-CM

## 2011-02-19 NOTE — Patient Instructions (Signed)
Wash open area of wound with warm water and apply a dry bandage daily. He may restart her Coumadin. Have your Coumadin time checked by Dr. Juliette Alcide.

## 2011-02-19 NOTE — Progress Notes (Signed)
[name] is here for another postop visit after emergency repair of an incarcerated ventral incisional hernia in May 2012. Had some draining from an open area in the lower portion of her wound that this dried up now. She is having BMs. She is slowly recovering some strength. Had a nosebleed on Friday and stopped her Coumadin. He is due to be seen by Dr. Juliette Alcide 2 days from now.  Exam: General-obese female walking with a rolling walker.  Abdomen- Incision with a superficial 3 mm open area inferiorly with no drainage. Swelling consistent with stroma under the superior aspect of the incision.    Assessment: Slowly improving. Wound almost completely healed.  Plan: She could restart her Coumadin. Have her INR checked by Dr. Juliette Alcide.  Clean open area of wound daily.  Return visit 4 weeks.

## 2011-02-20 NOTE — Consult Note (Signed)
NAMERICKIA, FREEBURG NO.:  [id number]  MEDICAL RECORD NO.:  [id number]  LOCATION:                                 FACILITY:  PHYSICIAN:  Alvin C. Lowell Guitar, M.D.  DATE OF BIRTH:  01/22/1951  DATE OF CONSULTATION: DATE OF DISCHARGE:                                CONSULTATION   I was asked by Dr. Waymon Amato to see this 60 year old female with history of light chain kappa multiple myeloma diagnosed in 2008.  She is status post chemotherapy with Revlimid.  This therapy is ongoing.  She had a recent hospitalization in June 2012 with abdominal wall abscess which was a result of prior ventral hernia repair with mesh.  This grew MRSA. The patient was discharged subsequently on December 23, 2010, and serum creatinine of 2.3 mg/dL which was her baseline.  The patient presents today with a 3-day history of gastrointestinal symptomatology including nausea, vomiting, diarrhea and also complaining of weakness.  She was found to have a serum creatinine of 5.8 mg/dL, potassium 7.5, and bicarb 11.  She has been treated with intravenous fluids and Kayexalate with improvement.  Since admission, the patient has noted abdominal wall drainage that was not there prior to coming into the hospital. Nephrology consultation was requested to assist with management.  PAST MEDICAL HISTORY: 1. Kappa light chain multiple myeloma. 2. History of right lower extremity DVT. 3. History of gastroesophageal reflux disease. 4. Hypertension. 5. Type 2 diabetes mellitus. 6. Ventral hernia repair in the past complicated by abdominal wall     abscess. 7. Dyslipidemia. 8. Congestive heart failure. 9. Status post panniculectomy. 10.Status post gastric binding. 11.Status post cervical dilatation. 12.Status post exploratory laparotomy with lysis of adhesions. 13.Repair mesh on Nov 22, 2010.  SOCIAL HISTORY:  She lives alone.  She does not smoke or consume alcoholic beverages.  She is allergic to  MORPHINE which causes hives.  CURRENT MEDICATIONS:  Aspirin, warfarin, insulin, Protonix, aspirin, heparin, Protonix, warfarin, and Dilaudid.  PHYSICAL EXAMINATION:  GENERAL:  She is an acutely ill-appearing African American female.  She is obese. VITAL SIGNS:  Blood pressure 126/68.  Temperature 98.2. HEENT:  Atraumatic and normocephalic.  Extraocular movements are intact. CHEST:  Right chest Port-A-Cath is in place. ABDOMEN:  Obese and soft.  There is drainage in the lower abdominal area with approximately 8-mm hole/crater in the midline scar.  In the lower abdominal area, there is clear purulent clotting drainage that is pouring out. NEUROLOGIC:  No focality.  LABORATORY DATA:  Sodium 141, potassium 5.3, chloride 115, CO2 of 14, BUN 63, and creatinine 5.17.  White blood count 1800, hematocrit 22.3, and platelets 120,000.  Ultrasound, right kidney 10 cm and left kidney 11.6 cm, no hydronephrosis.  ASSESSMENT: 1. Acute oliguric renal failure, probably hemodynamically mediated     secondary to gastrointestinal losses plus/minus infection related     to the abdominal wall abscess. 2. Stage III chronic kidney disease. 3. Kappa light chain multiple myeloma. 4. Recurrent abdominal wall drainage/abscess. 5. Pancytopenia.  RECOMMENDATIONS: 1. Intravenous fluids, surgical evaluation and/or CT of the     abdomen/pelvis. 2. Empiric antibiotic therapy for the abdominal wall infection.  ______________________________ Mindi Slicker Lowell Guitar, M.D.     ACP/MEDQ  D:  02/01/2011  T:  02/02/2011  Job:  [id number]  Electronically Signed by Casimiro Needle M.D. on 02/20/2011 05:51:48 PM

## 2011-02-21 ENCOUNTER — Other Ambulatory Visit: Payer: Self-pay | Admitting: Oncology

## 2011-02-21 ENCOUNTER — Encounter (HOSPITAL_BASED_OUTPATIENT_CLINIC_OR_DEPARTMENT_OTHER): Payer: Medicaid Other | Admitting: Oncology

## 2011-02-21 DIAGNOSIS — D638 Anemia in other chronic diseases classified elsewhere: Secondary | ICD-10-CM

## 2011-02-21 DIAGNOSIS — Z7901 Long term (current) use of anticoagulants: Secondary | ICD-10-CM

## 2011-02-21 DIAGNOSIS — Z86718 Personal history of other venous thrombosis and embolism: Secondary | ICD-10-CM

## 2011-02-21 DIAGNOSIS — Z5112 Encounter for antineoplastic immunotherapy: Secondary | ICD-10-CM

## 2011-02-21 DIAGNOSIS — C9000 Multiple myeloma not having achieved remission: Secondary | ICD-10-CM

## 2011-02-21 LAB — CBC WITH DIFFERENTIAL/PLATELET
BASO%: 0.7 % (ref 0.0–2.0)
EOS%: 0.6 % (ref 0.0–7.0)
HCT: 29 % — ABNORMAL LOW (ref 34.8–46.6)
LYMPH%: 28.8 % (ref 14.0–49.7)
MCH: 29.6 pg (ref 25.1–34.0)
MCHC: 33.4 g/dL (ref 31.5–36.0)
MCV: 88.7 fL (ref 79.5–101.0)
MONO%: 9.2 % (ref 0.0–14.0)
NEUT%: 60.7 % (ref 38.4–76.8)
Platelets: 207 10*3/uL (ref 145–400)
RBC: 3.27 10*6/uL — ABNORMAL LOW (ref 3.70–5.45)
WBC: 3.9 10*3/uL (ref 3.9–10.3)
nRBC: 0 % (ref 0–0)

## 2011-02-21 LAB — APTT: aPTT: 33 seconds (ref 24–37)

## 2011-02-27 LAB — KAPPA/LAMBDA LIGHT CHAINS
Kappa free light chain: 0.56 mg/dL (ref 0.33–1.94)
Kappa:Lambda Ratio: 1 (ref 0.26–1.65)
Lambda Free Lght Chn: 0.56 mg/dL — ABNORMAL LOW (ref 0.57–2.63)

## 2011-02-27 LAB — COMPREHENSIVE METABOLIC PANEL
BUN: 27 mg/dL — ABNORMAL HIGH (ref 6–23)
Chloride: 112 mEq/L (ref 96–112)
Glucose, Bld: 69 mg/dL — ABNORMAL LOW (ref 70–99)
Potassium: 4.2 mEq/L (ref 3.5–5.3)
Total Bilirubin: 0.3 mg/dL (ref 0.3–1.2)
Total Protein: 5.8 g/dL — ABNORMAL LOW (ref 6.0–8.3)

## 2011-02-27 LAB — SPEP & IFE WITH QIG
Beta 2: 4.7 % (ref 3.2–6.5)
Gamma Globulin: 19.8 % — ABNORMAL HIGH (ref 11.1–18.8)
IgA: 74 mg/dL (ref 68–380)
IgG (Immunoglobin G), Serum: 1400 mg/dL (ref 690–1700)
IgM, Serum: 14 mg/dL — ABNORMAL LOW (ref 52–322)
M-Spike, %: 0.09 g/dL

## 2011-02-28 ENCOUNTER — Encounter (HOSPITAL_BASED_OUTPATIENT_CLINIC_OR_DEPARTMENT_OTHER): Payer: Medicaid Other | Admitting: Oncology

## 2011-02-28 ENCOUNTER — Other Ambulatory Visit: Payer: Self-pay | Admitting: Oncology

## 2011-02-28 DIAGNOSIS — D649 Anemia, unspecified: Secondary | ICD-10-CM

## 2011-02-28 DIAGNOSIS — C9001 Multiple myeloma in remission: Secondary | ICD-10-CM

## 2011-02-28 DIAGNOSIS — C9000 Multiple myeloma not having achieved remission: Secondary | ICD-10-CM

## 2011-02-28 DIAGNOSIS — Z5112 Encounter for antineoplastic immunotherapy: Secondary | ICD-10-CM

## 2011-02-28 LAB — CBC WITH DIFFERENTIAL/PLATELET
BASO%: 0.4 % (ref 0.0–2.0)
EOS%: 0.4 % (ref 0.0–7.0)
HCT: 31.5 % — ABNORMAL LOW (ref 34.8–46.6)
MCH: 28.1 pg (ref 25.1–34.0)
MCHC: 31.4 g/dL — ABNORMAL LOW (ref 31.5–36.0)
NEUT%: 66.6 % (ref 38.4–76.8)
RDW: 20 % — ABNORMAL HIGH (ref 11.2–14.5)
lymph#: 1.2 10*3/uL (ref 0.9–3.3)

## 2011-03-07 ENCOUNTER — Encounter (HOSPITAL_BASED_OUTPATIENT_CLINIC_OR_DEPARTMENT_OTHER): Payer: Medicaid Other | Admitting: Oncology

## 2011-03-07 ENCOUNTER — Other Ambulatory Visit: Payer: Self-pay | Admitting: Oncology

## 2011-03-07 DIAGNOSIS — C9000 Multiple myeloma not having achieved remission: Secondary | ICD-10-CM

## 2011-03-07 DIAGNOSIS — Z5112 Encounter for antineoplastic immunotherapy: Secondary | ICD-10-CM

## 2011-03-07 DIAGNOSIS — C9001 Multiple myeloma in remission: Secondary | ICD-10-CM

## 2011-03-07 LAB — CBC WITH DIFFERENTIAL/PLATELET
EOS%: 0.2 % (ref 0.0–7.0)
Eosinophils Absolute: 0 10*3/uL (ref 0.0–0.5)
MCV: 92 fL (ref 79.5–101.0)
MONO%: 3.2 % (ref 0.0–14.0)
NEUT#: 3.7 10*3/uL (ref 1.5–6.5)
RBC: 3.37 10*6/uL — ABNORMAL LOW (ref 3.70–5.45)
RDW: 22.3 % — ABNORMAL HIGH (ref 11.2–14.5)
nRBC: 0 % (ref 0–0)

## 2011-03-14 ENCOUNTER — Encounter (INDEPENDENT_AMBULATORY_CARE_PROVIDER_SITE_OTHER): Payer: Medicaid Other | Admitting: General Surgery

## 2011-03-14 ENCOUNTER — Encounter (HOSPITAL_BASED_OUTPATIENT_CLINIC_OR_DEPARTMENT_OTHER): Payer: Medicaid Other | Admitting: Oncology

## 2011-03-14 ENCOUNTER — Other Ambulatory Visit: Payer: Self-pay | Admitting: Oncology

## 2011-03-14 ENCOUNTER — Encounter (INDEPENDENT_AMBULATORY_CARE_PROVIDER_SITE_OTHER): Payer: Self-pay | Admitting: General Surgery

## 2011-03-14 ENCOUNTER — Ambulatory Visit (INDEPENDENT_AMBULATORY_CARE_PROVIDER_SITE_OTHER): Payer: Medicaid Other | Admitting: General Surgery

## 2011-03-14 ENCOUNTER — Telehealth (INDEPENDENT_AMBULATORY_CARE_PROVIDER_SITE_OTHER): Payer: Self-pay | Admitting: General Surgery

## 2011-03-14 VITALS — BP 138/72 | HR 80 | Temp 96.7°F | Resp 20 | Ht 70.0 in | Wt 263.0 lb

## 2011-03-14 DIAGNOSIS — Z86718 Personal history of other venous thrombosis and embolism: Secondary | ICD-10-CM

## 2011-03-14 DIAGNOSIS — D649 Anemia, unspecified: Secondary | ICD-10-CM

## 2011-03-14 DIAGNOSIS — C9001 Multiple myeloma in remission: Secondary | ICD-10-CM

## 2011-03-14 DIAGNOSIS — Z5112 Encounter for antineoplastic immunotherapy: Secondary | ICD-10-CM

## 2011-03-14 DIAGNOSIS — IMO0002 Reserved for concepts with insufficient information to code with codable children: Secondary | ICD-10-CM

## 2011-03-14 DIAGNOSIS — N19 Unspecified kidney failure: Secondary | ICD-10-CM

## 2011-03-14 DIAGNOSIS — C9000 Multiple myeloma not having achieved remission: Secondary | ICD-10-CM

## 2011-03-14 LAB — CBC WITH DIFFERENTIAL/PLATELET
BASO%: 0.1 % (ref 0.0–2.0)
HCT: 33.5 % — ABNORMAL LOW (ref 34.8–46.6)
MCHC: 32.3 g/dL (ref 31.5–36.0)
MONO#: 0.1 10*3/uL (ref 0.1–0.9)
RBC: 3.58 10*6/uL — ABNORMAL LOW (ref 3.70–5.45)
RDW: 24.1 % — ABNORMAL HIGH (ref 11.2–14.5)
WBC: 3.8 10*3/uL — ABNORMAL LOW (ref 3.9–10.3)
lymph#: 0.4 10*3/uL — ABNORMAL LOW (ref 0.9–3.3)

## 2011-03-14 NOTE — Progress Notes (Signed)
Subjective:     Patient ID: [name], female   DOB: 01-11-1951, 60 y.o.   MRN: [id number]  HPI Patient is status post emergency repair of incarcerated incisional hernia by Dr. Abbey Chatters. She initially had some wound healing issues but improved. Last night she developed some serous drainage from her wound that woke her from sleep. She comes to the urgent office today. She is bleeding and moving her bowels. Bowel movements are occasionally soft  Review of Systems     Objective:   Physical Exam On exam abdomen is soft. The inferior portion of her incision has a heaped up area of granulation tissue. There is serous fluid draining centrally consistent with a draining seroma. This was treated locally with silver nitrate. A gauze dressing was applied.    Assessment:     Seroma with spontaneous drainage    Plan:     As there is no evidence of infection, this should improve with time. It was treated as above with silver nitrate. I instructed her to keep it covered with gauze dressing. We will have her see Dr. Abbey Chatters next week.

## 2011-03-14 NOTE — Telephone Encounter (Signed)
[name] CALLED TO SAY SHE HAS DRAINAGE FROM INCISION. CLEAR YELLOW STARTED YESTERDAY. SHE HAS USED A TOWEL TO ABSORB DRAINAGE. NO FEVER. [facility]. I SCHEDULED HER TO SEE DR. THOMPSON THIS PM IN URGENT OFFICE. PT WILL ARRANGE FOR TRANSPORTATION.

## 2011-03-14 NOTE — Patient Instructions (Signed)
Keep area covered with gauze.  Replace gauze after showering daily.

## 2011-03-18 ENCOUNTER — Ambulatory Visit (INDEPENDENT_AMBULATORY_CARE_PROVIDER_SITE_OTHER): Payer: Medicaid Other | Admitting: General Surgery

## 2011-03-18 VITALS — BP 118/80 | HR 80 | Temp 97.1°F | Resp 16 | Ht 68.0 in | Wt 250.0 lb

## 2011-03-18 DIAGNOSIS — K430 Incisional hernia with obstruction, without gangrene: Secondary | ICD-10-CM

## 2011-03-18 NOTE — Progress Notes (Signed)
[name] is here for another postop visit after emergency repair of an incarcerated ventral incisional hernia in May 2012. Had some draining from an open area in the lower portion of her wound that increased and she was seen by Dr. Janee Morn for this last week. The drainage is minimal now and her swelling has decreased.  She has been having some right leg pain since her emergency operation.  Exam: General-obese female walking with a rolling walker.  Abdomen- Incision with a superficial 3 mm open area inferiorly with minimla drainage. Swelling has decreased.    Assessment: Seroma drained spontaneously.  May have some sciatic nerve irritation.  Plan: Continue to keep wound clean.  Return visit 4 weeks.

## 2011-03-19 LAB — COMPREHENSIVE METABOLIC PANEL
ALT: 16 U/L (ref 0–35)
AST: 18 U/L (ref 0–37)
Calcium: 8.4 mg/dL (ref 8.4–10.5)
Chloride: 111 mEq/L (ref 96–112)
Creatinine, Ser: 1.36 mg/dL — ABNORMAL HIGH (ref 0.50–1.10)
Sodium: 142 mEq/L (ref 135–145)
Total Protein: 6.1 g/dL (ref 6.0–8.3)

## 2011-03-19 LAB — SPEP & IFE WITH QIG
IgA: 42 mg/dL — ABNORMAL LOW (ref 68–380)
IgG (Immunoglobin G), Serum: 916 mg/dL (ref 690–1700)
M-Spike, %: 0.56 g/dL
Total Protein, Serum Electrophoresis: 6.1 g/dL (ref 6.0–8.3)

## 2011-03-21 ENCOUNTER — Other Ambulatory Visit: Payer: Self-pay | Admitting: Medical

## 2011-03-21 ENCOUNTER — Encounter (HOSPITAL_BASED_OUTPATIENT_CLINIC_OR_DEPARTMENT_OTHER): Payer: Medicaid Other | Admitting: Oncology

## 2011-03-21 DIAGNOSIS — Z5112 Encounter for antineoplastic immunotherapy: Secondary | ICD-10-CM

## 2011-03-21 DIAGNOSIS — C9000 Multiple myeloma not having achieved remission: Secondary | ICD-10-CM

## 2011-03-21 DIAGNOSIS — C9001 Multiple myeloma in remission: Secondary | ICD-10-CM

## 2011-03-21 LAB — CBC WITH DIFFERENTIAL/PLATELET
BASO%: 0.6 % (ref 0.0–2.0)
Basophils Absolute: 0 10*3/uL (ref 0.0–0.1)
EOS%: 0 % (ref 0.0–7.0)
Eosinophils Absolute: 0 10*3/uL (ref 0.0–0.5)
HCT: 36.4 % (ref 34.8–46.6)
HGB: 11.5 g/dL — ABNORMAL LOW (ref 11.6–15.9)
LYMPH%: 12.7 % — ABNORMAL LOW (ref 14.0–49.7)
MCH: 29.4 pg (ref 25.1–34.0)
MCHC: 31.6 g/dL (ref 31.5–36.0)
MCV: 93.1 fL (ref 79.5–101.0)
MONO#: 0.2 10*3/uL (ref 0.1–0.9)
MONO%: 4.5 % (ref 0.0–14.0)
NEUT#: 4.2 10*3/uL (ref 1.5–6.5)
NEUT%: 82.2 % — ABNORMAL HIGH (ref 38.4–76.8)
Platelets: 191 10*3/uL (ref 145–400)
RBC: 3.91 10*6/uL (ref 3.70–5.45)
RDW: 21.5 % — ABNORMAL HIGH (ref 11.2–14.5)
WBC: 5.1 10*3/uL (ref 3.9–10.3)
lymph#: 0.7 10*3/uL — ABNORMAL LOW (ref 0.9–3.3)
nRBC: 0 % (ref 0–0)

## 2011-03-26 ENCOUNTER — Telehealth (INDEPENDENT_AMBULATORY_CARE_PROVIDER_SITE_OTHER): Payer: Self-pay

## 2011-03-26 NOTE — Telephone Encounter (Signed)
Pt called today c/o more drainage from her abdominal area.  She states the drainage is brown and soaked her bed linens.  She is afebrile and in no pain.  She was offered an appt today with urgent office but declined due to transportation issues.  She is scheduled to see Dr. Michaell Cowing in urgent office tomorrow.

## 2011-03-27 ENCOUNTER — Encounter (INDEPENDENT_AMBULATORY_CARE_PROVIDER_SITE_OTHER): Payer: Self-pay | Admitting: General Surgery

## 2011-03-27 ENCOUNTER — Inpatient Hospital Stay (HOSPITAL_COMMUNITY)
Admission: AD | Admit: 2011-03-27 | Discharge: 2011-04-22 | DRG: 857 | Disposition: A | Discharge status: Home Health | Payer: Medicaid Other | Source: Ambulatory Visit | Attending: General Surgery | Admitting: General Surgery

## 2011-03-27 ENCOUNTER — Ambulatory Visit (INDEPENDENT_AMBULATORY_CARE_PROVIDER_SITE_OTHER): Payer: Medicaid Other | Admitting: General Surgery

## 2011-03-27 ENCOUNTER — Inpatient Hospital Stay (HOSPITAL_COMMUNITY): Payer: Medicaid Other

## 2011-03-27 VITALS — BP 122/78 | HR 80 | Temp 98.3°F | Resp 20 | Ht 69.0 in | Wt 258.0 lb

## 2011-03-27 DIAGNOSIS — N189 Chronic kidney disease, unspecified: Secondary | ICD-10-CM | POA: Diagnosis present

## 2011-03-27 DIAGNOSIS — I129 Hypertensive chronic kidney disease with stage 1 through stage 4 chronic kidney disease, or unspecified chronic kidney disease: Secondary | ICD-10-CM | POA: Diagnosis present

## 2011-03-27 DIAGNOSIS — I509 Heart failure, unspecified: Secondary | ICD-10-CM | POA: Diagnosis present

## 2011-03-27 DIAGNOSIS — I5032 Chronic diastolic (congestive) heart failure: Secondary | ICD-10-CM | POA: Diagnosis present

## 2011-03-27 DIAGNOSIS — D649 Anemia, unspecified: Secondary | ICD-10-CM | POA: Diagnosis present

## 2011-03-27 DIAGNOSIS — L02211 Cutaneous abscess of abdominal wall: Secondary | ICD-10-CM

## 2011-03-27 DIAGNOSIS — K654 Sclerosing mesenteritis: Secondary | ICD-10-CM | POA: Diagnosis present

## 2011-03-27 DIAGNOSIS — E119 Type 2 diabetes mellitus without complications: Secondary | ICD-10-CM | POA: Diagnosis present

## 2011-03-27 DIAGNOSIS — L02219 Cutaneous abscess of trunk, unspecified: Secondary | ICD-10-CM

## 2011-03-27 DIAGNOSIS — Z9049 Acquired absence of other specified parts of digestive tract: Secondary | ICD-10-CM

## 2011-03-27 DIAGNOSIS — A4902 Methicillin resistant Staphylococcus aureus infection, unspecified site: Secondary | ICD-10-CM | POA: Diagnosis present

## 2011-03-27 DIAGNOSIS — G4700 Insomnia, unspecified: Secondary | ICD-10-CM | POA: Diagnosis present

## 2011-03-27 DIAGNOSIS — T8140XA Infection following a procedure, unspecified, initial encounter: Principal | ICD-10-CM | POA: Diagnosis present

## 2011-03-27 DIAGNOSIS — Z86718 Personal history of other venous thrombosis and embolism: Secondary | ICD-10-CM

## 2011-03-27 DIAGNOSIS — D61818 Other pancytopenia: Secondary | ICD-10-CM | POA: Diagnosis present

## 2011-03-27 DIAGNOSIS — Z6838 Body mass index (BMI) 38.0-38.9, adult: Secondary | ICD-10-CM

## 2011-03-27 DIAGNOSIS — Y836 Removal of other organ (partial) (total) as the cause of abnormal reaction of the patient, or of later complication, without mention of misadventure at the time of the procedure: Secondary | ICD-10-CM | POA: Diagnosis present

## 2011-03-27 DIAGNOSIS — IMO0002 Reserved for concepts with insufficient information to code with codable children: Secondary | ICD-10-CM | POA: Diagnosis not present

## 2011-03-27 DIAGNOSIS — Z7901 Long term (current) use of anticoagulants: Secondary | ICD-10-CM

## 2011-03-27 DIAGNOSIS — C9000 Multiple myeloma not having achieved remission: Secondary | ICD-10-CM | POA: Diagnosis present

## 2011-03-27 DIAGNOSIS — L03319 Cellulitis of trunk, unspecified: Secondary | ICD-10-CM | POA: Diagnosis present

## 2011-03-27 DIAGNOSIS — Y838 Other surgical procedures as the cause of abnormal reaction of the patient, or of later complication, without mention of misadventure at the time of the procedure: Secondary | ICD-10-CM | POA: Diagnosis not present

## 2011-03-27 LAB — COMPREHENSIVE METABOLIC PANEL
ALT: 18 U/L (ref 0–35)
Albumin: 2.8 g/dL — ABNORMAL LOW (ref 3.5–5.2)
Alkaline Phosphatase: 104 U/L (ref 39–117)
Calcium: 8.5 mg/dL (ref 8.4–10.5)
GFR calc Af Amer: 64 mL/min — ABNORMAL LOW (ref >90)
Glucose, Bld: 88 mg/dL (ref 70–99)
Potassium: 3.9 mEq/L (ref 3.5–5.1)
Sodium: 140 mEq/L (ref 135–145)
Total Protein: 6.3 g/dL (ref 6.0–8.3)

## 2011-03-27 LAB — PROTIME-INR: Prothrombin Time: 17.6 seconds — ABNORMAL HIGH (ref 11.6–15.2)

## 2011-03-27 LAB — CBC
MCH: 29.4 pg (ref 26.0–34.0)
MCHC: 31.5 g/dL (ref 30.0–36.0)
RDW: 19.2 % — ABNORMAL HIGH (ref 11.5–15.5)

## 2011-03-27 MED ORDER — IOHEXOL 300 MG/ML  SOLN
100.0000 mL | Freq: Once | INTRAMUSCULAR | Status: AC | PRN
  Administered 2011-03-27: 100 mL via INTRAVENOUS

## 2011-03-27 NOTE — Progress Notes (Signed)
Chief Complaint  Patient presents with  . Other    urgent- eval increased drainage from incision with bad odor.[name]    HPI [name] is a 60 y.o. female.   HPI This is a 60 year old female with multiple medical problems. She was admitted to the hospital in May with a small bowel obstruction from an incarcerated ventral hernia. Initially she was operated on by Dr. Abbey Chatters had this repaired. She had a difficult postoperative course that was complicated by DVT as well as abdominal wall fluid collections that it had drains in them also. I reviewed her last office visits it appears she has had a seroma that has been present both by exam and by CT scan since August. This is being followed currently is as it had been draining. Over the last several days this area is distorted the drain foul-smelling milky-appearing fluid. She comes in today to have this evaluated and she is concerned about infection. She denies any fevers but she overall does not feel well.  Past Medical History  Diagnosis Date  . Small bowel obstruction   . Diabetes mellitus   . Hypertension   . Hyperlipidemia   . Chronic renal insufficiency   . Multiple myeloma   . Abdominal pain   . Ventral hernia   . Congestive heart failure   . GERD (gastroesophageal reflux disease)   . Renal insufficiency   . Obesity   . Depression   . Anxiety   . Abscess     groin    Past Surgical History  Procedure Date  . Hernia repair     ventral hernia  . Exploratory laparotomy   . Oophorectomy   . Breast surgery     reduction  . Panniculectomy   . Incise and drain abcess     groin    Family History  Problem Relation Age of Onset  . Heart disease Mother     Social History History  Substance Use Topics  . Smoking status: Former Games developer  . Smokeless tobacco: Not on file  . Alcohol Use: No    Allergies  Allergen Reactions  . Ibuprofen Other (See Comments)    Patient does not remember.   . Morphine And Related  Other (See Comments)    Patient does not remember reaction, believes it caused raised marks on skin.    Current Outpatient Prescriptions  Medication Sig Dispense Refill  . amLODipine (NORVASC) 5 MG tablet Take 5 mg by mouth daily.        [name] aspirin 81 MG tablet Take 81 mg by mouth daily.        [name] atenolol (TENORMIN) 25 MG tablet Take 25 mg by mouth daily.        [name] buPROPion (WELLBUTRIN XL) 150 MG 24 hr tablet Take 150 mg by mouth daily.        . furosemide (LASIX) 40 MG tablet Take 40 mg by mouth 2 (two) times daily.        [name] HYDROcodone-acetaminophen (LORTAB) 7.5-500 MG/15ML solution Take by mouth every 6 (six) hours as needed.        . Hypromellose (ARTIFICIAL TEARS OP) Apply to eye as needed.        . loratadine (CLARITIN) 10 MG tablet Take 10 mg by mouth daily.        . Multiple Vitamin (MULTIVITAMIN) tablet Take 1 tablet by mouth daily.        . ondansetron (ZOFRAN) 8 MG tablet Take by mouth every  8 (eight) hours as needed.        . ondansetron (ZOFRAN-ODT) 4 MG disintegrating tablet Take 4 mg by mouth every 8 (eight) hours as needed.        [name] oxybutynin (DITROPAN) 5 MG tablet Take 5 mg by mouth daily.        . potassium chloride (KLOR-CON) 20 MEQ packet Take 20 mEq by mouth daily.        . pravastatin (PRAVACHOL) 40 MG tablet Take 40 mg by mouth daily.        . prochlorperazine (COMPAZINE) 10 MG tablet Take 10 mg by mouth every 6 (six) hours as needed.        . ranitidine (ZANTAC) 150 MG tablet Take 150 mg by mouth 2 (two) times daily.        . temazepam (RESTORIL) 7.5 MG capsule Take 15 mg by mouth at bedtime as needed.        . warfarin (COUMADIN) 5 MG tablet Take 5 mg by mouth daily.          Review of Systems Review of Systems  Blood pressure 122/78, pulse 80, temperature 98.3 F (36.8 C), temperature source Temporal, resp. rate 20, height 5\' 9"  (1.753 m), weight 258 lb (117.028 kg).  Physical Exam Physical Exam  Abdominal: Soft. Bowel sounds are normal. There is tenderness  (mildly tender at lower portion of incision).       Draining purulent fluid from lower portion of incision that is foul smelling     Data Reviewed Prior ct scan in August  Assessment    S/p ventral hernia repair and prolonged hospital course. Abdominal wall collection now with abscess    Plan    She has a long-term seroma that is now infected. She clearly is draining purulent fluid from her abdominal wall at this point. Because of her diabetes multiple other medical problems and Coumadin usage I think the best plan would be to admit her place her on IV antibiotics and have this area drained. There is always a chance that she might need to go to the operating room hopefully this can be managed percutaneously. We will plan on admitting her for IV antibiotics.       [name] 03/27/2011, 2:55 PM

## 2011-03-28 ENCOUNTER — Other Ambulatory Visit (INDEPENDENT_AMBULATORY_CARE_PROVIDER_SITE_OTHER): Payer: Self-pay | Admitting: General Surgery

## 2011-03-28 DIAGNOSIS — L02219 Cutaneous abscess of trunk, unspecified: Secondary | ICD-10-CM

## 2011-03-28 DIAGNOSIS — K651 Peritoneal abscess: Secondary | ICD-10-CM

## 2011-03-28 DIAGNOSIS — L03319 Cellulitis of trunk, unspecified: Secondary | ICD-10-CM

## 2011-03-28 LAB — GLUCOSE, CAPILLARY
Glucose-Capillary: 102 mg/dL — ABNORMAL HIGH (ref 70–99)
Glucose-Capillary: 78 mg/dL (ref 70–99)
Glucose-Capillary: 74 mg/dL (ref 70–99)
Glucose-Capillary: 86 mg/dL (ref 70–99)
Glucose-Capillary: 93 mg/dL (ref 70–99)

## 2011-03-28 LAB — MRSA PCR SCREENING: MRSA by PCR: POSITIVE — AB

## 2011-03-29 LAB — BASIC METABOLIC PANEL
Calcium: 8.1 mg/dL — ABNORMAL LOW (ref 8.4–10.5)
GFR calc Af Amer: 70 mL/min — ABNORMAL LOW (ref >90)
GFR calc non Af Amer: 61 mL/min — ABNORMAL LOW (ref >90)
Potassium: 3.9 mEq/L (ref 3.5–5.1)
Sodium: 142 mEq/L (ref 135–145)

## 2011-03-29 LAB — GLUCOSE, CAPILLARY: Glucose-Capillary: 101 mg/dL — ABNORMAL HIGH (ref 70–99)

## 2011-03-29 LAB — CBC
Platelets: 145 10*3/uL — ABNORMAL LOW (ref 150–400)
RBC: 3.34 MIL/uL — ABNORMAL LOW (ref 3.87–5.11)
RDW: 19.2 % — ABNORMAL HIGH (ref 11.5–15.5)
WBC: 4.2 10*3/uL (ref 4.0–10.5)

## 2011-03-29 LAB — MAGNESIUM: Magnesium: 2.1 mg/dL (ref 1.5–2.5)

## 2011-03-29 LAB — HEMOGLOBIN A1C
Hgb A1c MFr Bld: 5 % (ref <5.7)
Mean Plasma Glucose: 97 mg/dL (ref <117)

## 2011-03-29 LAB — PROTIME-INR: INR: 1.12 (ref 0.00–1.49)

## 2011-03-30 LAB — GLUCOSE, CAPILLARY
Glucose-Capillary: 122 mg/dL — ABNORMAL HIGH (ref 70–99)
Glucose-Capillary: 110 mg/dL — ABNORMAL HIGH (ref 70–99)
Glucose-Capillary: 122 mg/dL — ABNORMAL HIGH (ref 70–99)

## 2011-03-30 LAB — CULTURE, ROUTINE-ABSCESS: Gram Stain: NONE SEEN

## 2011-03-30 LAB — CBC
MCV: 94 fL (ref 78.0–100.0)
Platelets: 148 10*3/uL — ABNORMAL LOW (ref 150–400)
RDW: 18.8 % — ABNORMAL HIGH (ref 11.5–15.5)
WBC: 4 10*3/uL (ref 4.0–10.5)

## 2011-03-30 LAB — BASIC METABOLIC PANEL
BUN: 8 mg/dL (ref 6–23)
Calcium: 8.3 mg/dL — ABNORMAL LOW (ref 8.4–10.5)
GFR calc non Af Amer: 50 mL/min — ABNORMAL LOW (ref >90)
Glucose, Bld: 98 mg/dL (ref 70–99)
Sodium: 142 mEq/L (ref 135–145)

## 2011-03-30 LAB — PROTIME-INR: Prothrombin Time: 15.2 seconds (ref 11.6–15.2)

## 2011-03-31 LAB — CULTURE, ROUTINE-ABSCESS

## 2011-03-31 LAB — GLUCOSE, CAPILLARY: Glucose-Capillary: 73 mg/dL (ref 70–99)

## 2011-04-01 DIAGNOSIS — D68318 Other hemorrhagic disorder due to intrinsic circulating anticoagulants, antibodies, or inhibitors: Secondary | ICD-10-CM

## 2011-04-01 LAB — PROTIME-INR: INR: 1.16 (ref 0.00–1.49)

## 2011-04-01 LAB — GLUCOSE, CAPILLARY
Glucose-Capillary: 85 mg/dL (ref 70–99)
Glucose-Capillary: 87 mg/dL (ref 70–99)

## 2011-04-02 LAB — GLUCOSE, CAPILLARY: Glucose-Capillary: 85 mg/dL (ref 70–99)

## 2011-04-02 LAB — ANAEROBIC CULTURE

## 2011-04-02 LAB — PROTIME-INR: Prothrombin Time: 13.8 seconds (ref 11.6–15.2)

## 2011-04-03 LAB — PROTIME-INR
INR: 0.97 (ref 0.00–1.49)
Prothrombin Time: 13.1 seconds (ref 11.6–15.2)

## 2011-04-03 LAB — GLUCOSE, CAPILLARY
Glucose-Capillary: 99 mg/dL (ref 70–99)
Glucose-Capillary: 86 mg/dL (ref 70–99)
Glucose-Capillary: 103 mg/dL — ABNORMAL HIGH (ref 70–99)

## 2011-04-04 LAB — CBC
MCH: 29 pg (ref 26.0–34.0)
MCHC: 30.9 g/dL (ref 30.0–36.0)
MCV: 93.7 fL (ref 78.0–100.0)
Platelets: 237 10*3/uL (ref 150–400)
RDW: 18.5 % — ABNORMAL HIGH (ref 11.5–15.5)
WBC: 4.6 10*3/uL (ref 4.0–10.5)

## 2011-04-04 LAB — URINALYSIS, MICROSCOPIC ONLY
Hgb urine dipstick: NEGATIVE
Nitrite: NEGATIVE
Specific Gravity, Urine: 1.016 (ref 1.005–1.030)
Urobilinogen, UA: 0.2 mg/dL (ref 0.0–1.0)

## 2011-04-04 LAB — GLUCOSE, CAPILLARY

## 2011-04-04 NOTE — Discharge Summary (Signed)
NAMEADRI, SCHLOSS NO.:  [id number]  MEDICAL RECORD NO.:  [id number]  LOCATION:  6706                         FACILITY:  [facility]  PHYSICIAN:  Zannie Cove, MD     DATE OF BIRTH:  December 26, 1950  DATE OF ADMISSION:  01/31/2011 DATE OF DISCHARGE:                        DISCHARGE SUMMARY - REFERRING   PRIMARY CARE PHYSICIAN:  Dr. Clelia Croft.  ONCOLOGIST:  Benjiman Core, M.D.  NEPHROLOGIST:  Terrial Rhodes, M.D.  GENERAL SURGEON:  Adolph Pollack, M.D.  DISCHARGE DIAGNOSES: 1. Acute renal failure on chronic kidney disease, improved to     baseline. 2. Anion metabolic acidosis, resolved. 3. Hyperkalemia, resolved. 4. Pancytopenia, secondary to multiple myeloma, on Revlimid. 5. Anterior abdominal wall wound related to surgical site. 6. History of DVT in June 2012. 7. Chronic diastolic congestive heart failure. 8. Hypertension. 9. Multiple myeloma. 10.Anemia of chronic disease. 11.Borderline diabetes.DISCHARGE MEDICATIONS: 1. Doxycycline 100 mg p.o. b.i.d. for 3 more days. 2. Atenolol 25 mg tablet 1/2 tablet p.o. daily. 3. Warfarin 7.5 mg tablet from August 20 to August 22 and then based     on INR from August 23. 4. Bupropion XL 150 mg daily. 5. Aranesp 150 mcg subcu weekly. 6. Lasix 40 mg every other day. 7. Hydrocodone/APAP 2.5-167/5 elixir 1-2 teaspoons p.o. q.4 h. p.r.n. 8. Loratadine 10 mg daily. 9. Multivitamin one tablet daily. 10.Oxybutynin 5 mg daily. 11.Potassium chloride 20 mEq daily. 12.Pravachol 40 mg daily. 13.Ranitidine 150 mg b.i.d. 14.Temazepam 7.5 mg p.o. at bedtime. 15.Zofran 8 mg p.o. q.8 h. P.r.n.  DIAGNOSTIC INVESTIGATIONS: 1. CT of the head on August 9; findings consistent with partial or     complete empty sella, calvarial changes noted also. 2. Chest x-ray on August 9; expansile lytic lesion in the left fifth     rib and several lytic lesions in the proximal right humerus. 3. Renal ultrasound was unremarkable with no  hydronephrosis and no     explanation for acute renal failure. 4. KUB showed nonspecific bowel gas pattern. 5. CT of abdomen and pelvis on 08/11 shows percutaneous drainage     catheter removal and reaccumulation of fluid and gas in the deep     anterior abdominal wall and subcutaneous collection.  No acute     intra-abdominal process.  HOSPITAL COURSE:  Ms. Laroche is a 60 year old female with multiple medical problems, who presented to [facility] with acute-on-chronic renal failure, hyperkalemia, metabolic acidosis, and pancytopenia.  On initial evaluation, creatinine was 5.2. 1. Acute-on-chronic renal failure felt to be multifactorial due to     dehydration as well as secondary to her Revlimid as well as     myeloma.  She was initially gently hydrated and offending agents     were clean.  Diuretics were withheld.  She did have a renal     ultrasound which was unremarkable.  Subsequently, her creatinine     improved and stabilized in the mid 2.7 range at the time of     discharge. 2. Pancytopenia, felt to be secondary to multiple myeloma.  She will     follow up with Dr. Clelia Croft for this. 3. History of DVT.  Continued on Coumadin.  INR is 1.88 today.  We     will require adjustment in her doses and repeat INR on 08/23 for a     goal of 2-3.  Her DVT was back in June 2012, and will require     treatment with Coumadin for 3-6 months. 4. Borderline diabetes, been treated with sliding scale insulin and     her hemoglobin has been stable.  DISCHARGE CONDITION:  Stable.  VITAL SIGNS:  Temperature of 98.2, pulse 88, blood pressure 113/83, respirations 20, satting 99% on room air.  DISCHARGE LABORATORY DATA:  White count of 3.6, hemoglobin 9.0, platelets 231.  DISCHARGE FOLLOWUP: 1. Primary physician, Dr. Clelia Croft in 1 week. 2. Dr. Clelia Croft.  His office will call for followup. 3. Dr. Abbey Chatters.  The patient has appointment for February 13, 2011.     Zannie Cove,  MD     PJ/MEDQ  D:  02/11/2011  T:  02/11/2011  Job:  [id number]  cc:   Adolph Pollack, M.D. Benjiman Core, M.D. Dr. Clelia Croft  Electronically Signed by Zannie Cove  on 04/04/2011 02:06:13 PM

## 2011-04-05 LAB — GLUCOSE, CAPILLARY
Glucose-Capillary: 80 mg/dL (ref 70–99)
Glucose-Capillary: 131 mg/dL — ABNORMAL HIGH (ref 70–99)

## 2011-04-05 LAB — PROTIME-INR
INR: 1 (ref 0.00–1.49)
Prothrombin Time: 13.4 seconds (ref 11.6–15.2)

## 2011-04-06 LAB — GLUCOSE, CAPILLARY

## 2011-04-06 NOTE — H&P (Signed)
[name], PARA NO.:  [id number]  MEDICAL RECORD NO.:  [id number]  LOCATION:  6703                         FACILITY:  [facility]  PHYSICIAN:  Lonia Blood, M.D.      DATE OF BIRTH:  Mar 18, 1951  DATE OF ADMISSION:  01/31/2011 DATE OF DISCHARGE:                             HISTORY & PHYSICAL   PRIMARY CARE PHYSICIAN:  Dr. Clelia Croft.  NEPHROLOGIST:  Terrial Rhodes, MD  ONCOLOGIST:  Benjiman Core, MD  PRESENTING COMPLAINT:  Nausea, vomiting and weakness.  HISTORY OF PRESENT ILLNESS:  The patient is a 60 year old female with history of multiple myeloma which is in remission after previous chemo that was in the hospital several times this year.  Her last admission was in June of this year when she was admitted with acute on chronic renal failure as well as small-bowel obstruction.  The patient had history of ventral hernia with repair and mesh and she has had complications from that as well.  She has had multiple surgeries from that.  She went to the [facility] with the weakness, nausea which has been going on for 3 days.  She was evaluated and found to have a potassium of 7.5 and creatinine of more than 5.  Her baseline creatinine was apparently 2.5.  She was, therefore, sent over for admission with acute renal failure and hyperkalemia.  PAST MEDICAL HISTORY:  Extensive including multiple myeloma supposedly in remission, history of DVT in the right lower extremity, history of MRSA, GERD, hypertension, type 2 diabetes, history of ventral hernia with abdominal surgery with mesh procedure, hypertension, morbid obesity, CHF with diastolic dysfunction and hyperlipidemia.  PAST SURGICAL HISTORY:  She is status post panniculectomy, status post gastric binding, status post breast reduction surgery, status post cervical dilatation and panniculectomy, status post right trigger thumb surgery.  Status post exploratory laparotomy with lysis of  adhesions, repair of incarcerated ventral incisional hernia with empty F mesh on Nov 22, 2010.  ALLERGIES:  To MORPHINE that causes hives.  CURRENT MEDICATIONS: 1. Hydrocodone/acetaminophen 7.5/500 mg on 50 mL solutions. 2. Artificial tears. 3. Multivitamins. 4. Amlodipine 5 mg daily. 5. Aspirin 81 mg daily. 6. Atenolol 25 mg daily. 7. Wellbutrin XL 150 mg daily. 8. Lasix 40 mg daily. 9. Loratadine 10 mg daily. 10.Zofran 80 mg daily. 11.Oxybutynin 5 mg daily. 12.Potassium chloride 20 mEq daily. 13.Pravastatin 40 mg daily. 14.Prochlorperazine 10 mg as needed. 15.Ranitidine 150 mg daily. 16.Temazepam 7.5 mg at night. 17.Also, Coumadin 5 mg.  SOCIAL HISTORY:  The patient lives alone.  She is disabled.  Denied any tobacco, alcohol or IV drug use.  FAMILY HISTORY:  Denied any significant family history at this point.  REVIEW OF SYSTEMS:  The patient has chronic urinary incontinence. Otherwise, all systems reviewed are negative except per HPI.  PHYSICAL EXAMINATION:  VITAL SIGNS:  Her temperature is 97.9, blood pressure 108/65, pulse 85, respiratory rate 20 and sats 98% on room air. GENERAL:  She is awake, alert, oriented, seems slightly depressed, in no acute distress. HEENT:  PERRL.  EOMI.  No pallor, no jaundice and no rhinorrhea. NECK:  Supple.  No visible JVD, no lymphadenopathy. RESPIRATORY:  She has good air  entry bilaterally.  No wheezes, no rales, no crackles. CARDIOVASCULAR SYSTEM:  She has S1 and S2.  No murmur. ABDOMEN:  Obese with large periumbilical induration versus mass consistent with her previous mesh which is tender to touch.  There is slight skin breakdown over the scar tissue.  No obvious discharge. Positive bowel sounds. EXTREMITIES:  No edema, cyanosis or clubbing. SKIN:  Mainly in the abdomen with multiple scar tissue and mild ulceration.  LABORATORY DATA:  Her initial labs showed a sodium of 131, potassium of 7.5, chloride 103, CO2 is 11, glucose  100, BUN 73, creatinine 5.80 with a GFR of 9, total bilirubin 0.4, alkaline phosphatase 228.  AST 29, ALT 63, total protein 7.0, albumin 2.7 with calcium 9.1.  Currently, her sodium is 135, potassium 6.2, chloride 111, her CO2 has gone to 13, glucose 108, BUN 68, creatinine 5.25 with GFR is 10 and calcium 9 after initial treatment.  Her white count 2.6, hemoglobin 8.5 with platelet of 125, MCV of 83.  Her differential showed absolute lymphocytopenia.  PT 19.0 with INR 1.56.  Chest x-ray showed expansile lytic lesion lateral left fifth rib and possible septal lytic lesions proximal right humerus, differentials include metastatic disease.  No active cardiopulmonary disease.  Head CT without contrast showed findings consistent with partial or complete empty sella, changes.  Her differential diagnoses including Paget disease and sickle cell anemia.  EKG shows sinus rhythm with no significant ST changes.  ASSESSMENT:  This is a 60 year old female with multiple medical problems presenting with acute on chronic renal failure, hyperkalemia, metabolic acidosis and pancytopenia among other things.  The patient has known history of multiple myeloma or maybe having a recurrence.  She also had nausea, but denied excessive vomiting, so active renal failure is more likely to be combination of prerenal and renal.  No evidence of obstruction.  PLAN: 1. Acute on chronic renal failure.  We will admit the patient, hydrate     her and follow renal function closely.  We will get Nephrology     consulted as the patient may require dialysis ultimately.  In the     meantime, I will get renal ultrasound again as well.  Also, get her     total CK to rule out rhabdomyolysis especially in the setting of     her known anterior abdominal wall abscess. 2. Hyperkalemia.  Again, this maybe the result of her renal failure.     She has been received glucose and insulin as well as sodium     bicarbonate.  I will give her  Kayexalate at this point starting at     30 g b.i.d.  We will follow until potassium corrects multiple     myeloma.  She probably needs another followup with Dr. Eli Hose.  According to the patient, she has an appointment on the     16th.  We will see if we will consult him in the hospital. 3. Pancytopenia.  The patient is not on any chemo, but this could     reflect bone marrow involvement from her multiple myeloma.  Her     chest x-ray shows some lytic lesions on the bone which may require     further to followup including total body scan. 4. History of DVT.  She is on Coumadin, but subtherapeutic.  We will     continue with Coumadin.  If needed, we will bridge it. 5. GERD.  Continue with PPI. 6.  Diabetes.  I will put her on sliding scale insulin and then CBG     q.a.c. and nightly. 7. Hypertension.  Again, continue with her home medications. 8. Hyperlipidemia.  We will continue with home medicine as well. 9. Protein-calorie malnutrition, probably from her myeloma and kidney     disease.  We will get nutrition consult. 10.Morbid obesity, seems stable from that standpoint also.     Lonia Blood, M.D.     [name]  D:  01/31/2011  T:  02/01/2011  Job:  [id number]  Electronically Signed by Lonia Blood M.D. on 04/06/2011 02:55:13 PM

## 2011-04-07 LAB — CBC
HCT: 28.7 % — ABNORMAL LOW (ref 36.0–46.0)
Hemoglobin: 8.9 g/dL — ABNORMAL LOW (ref 12.0–15.0)
MCH: 29 pg (ref 26.0–34.0)
MCHC: 31 g/dL (ref 30.0–36.0)

## 2011-04-07 LAB — GLUCOSE, CAPILLARY
Glucose-Capillary: 170 mg/dL — ABNORMAL HIGH (ref 70–99)
Glucose-Capillary: 119 mg/dL — ABNORMAL HIGH (ref 70–99)

## 2011-04-07 LAB — PROTIME-INR: INR: 1.35 (ref 0.00–1.49)

## 2011-04-08 LAB — PROTIME-INR
INR: 1.34 (ref 0.00–1.49)
Prothrombin Time: 16.8 seconds — ABNORMAL HIGH (ref 11.6–15.2)

## 2011-04-08 LAB — GLUCOSE, CAPILLARY

## 2011-04-09 LAB — BASIC METABOLIC PANEL
BUN: 13 mg/dL (ref 6–23)
Chloride: 108 mEq/L (ref 96–112)
GFR calc Af Amer: 54 mL/min — ABNORMAL LOW (ref >90)
GFR calc non Af Amer: 46 mL/min — ABNORMAL LOW (ref >90)
Potassium: 4.5 mEq/L (ref 3.5–5.1)
Sodium: 139 mEq/L (ref 135–145)

## 2011-04-09 LAB — CBC
Hemoglobin: 8.3 g/dL — ABNORMAL LOW (ref 12.0–15.0)
MCH: 29 pg (ref 26.0–34.0)
MCV: 92 fL (ref 78.0–100.0)
RBC: 2.86 MIL/uL — ABNORMAL LOW (ref 3.87–5.11)
WBC: 3.9 10*3/uL — ABNORMAL LOW (ref 4.0–10.5)

## 2011-04-09 LAB — GLUCOSE, CAPILLARY: Glucose-Capillary: 113 mg/dL — ABNORMAL HIGH (ref 70–99)

## 2011-04-09 LAB — PROTIME-INR: Prothrombin Time: 19.5 seconds — ABNORMAL HIGH (ref 11.6–15.2)

## 2011-04-10 LAB — RENAL FUNCTION PANEL
Albumin: 3.2 — ABNORMAL LOW
BUN: 88 — ABNORMAL HIGH
Calcium: 9.5
Creatinine, Ser: 5.3 — ABNORMAL HIGH
Glucose, Bld: 94
Phosphorus: 5.5 — ABNORMAL HIGH

## 2011-04-10 LAB — CBC
MCV: 91.4 fL (ref 78.0–100.0)
Platelets: 247 10*3/uL (ref 150–400)
RBC: 2.9 MIL/uL — ABNORMAL LOW (ref 3.87–5.11)
RDW: 18.4 % — ABNORMAL HIGH (ref 11.5–15.5)
WBC: 4.1 10*3/uL (ref 4.0–10.5)

## 2011-04-10 LAB — GLUCOSE, CAPILLARY
Glucose-Capillary: 93 mg/dL (ref 70–99)
Glucose-Capillary: 75 mg/dL (ref 70–99)

## 2011-04-10 LAB — PROTIME-INR
INR: 1.6 — ABNORMAL HIGH (ref 0.00–1.49)
Prothrombin Time: 19.3 seconds — ABNORMAL HIGH (ref 11.6–15.2)

## 2011-04-10 NOTE — Op Note (Signed)
NAMESABRE, ROMBERGER NO.:  [id number]  MEDICAL RECORD NO.:  [id number]  LOCATION:  1534                         FACILITY:  [facility]  PHYSICIAN:  Anselm Pancoast. Lanitra Battaglini, M.D.DATE OF BIRTH:  Oct 03, 1950  DATE OF PROCEDURE:  03/28/2011 DATE OF DISCHARGE:                              OPERATIVE REPORT   PREOPERATIVE DIAGNOSIS:  Multiple abscesses, abdominal wall, approximately 3 months status post small bowel resection and attempt at abdominal closure with PTF biological mesh, multiple fluid collections drained postoperatively, abdominal wall.  OPERATION:  Expiration, wound debridement of necrotic fat and placement of a wound vac.  ANESTHESIA:  General anesthesia.  SURGEON:  Anselm Pancoast. Zachery Dakins, M.D.  HISTORY:  Azalya Galyon is a 60 year old morbid obese female who has had complicated medical issues.  She has had previous abdominoplasty, hernias etc. and then approximately 4 months ago had a small bowel obstruction, I think Dr. Abbey Chatters was the operating surgeon.  The small bowel was repaired, could not close the abdomen and this was closed with a piece of PTF mesh.  She has been in and out of the hospital several times since with drainage that has required radiological drainage and has set in and she was readmitted last evening by Dr. Johna Sheriff.  I saw her today or actually, Dr. Dwain Sarna saw I think in the office.  I saw her today for the first time and she has got obviously big firm areas of draining pus and fluid, it does not look intestinal in contents and the CT that was performed did not show an actual fistula.  I have reviewed the x-rays and also discussed with Dr. Ezzard Standing who reviewed the x-rays and we feel that this definitely needs to be surgically opened up and what next will be determined by what we actually find.  She has a past history I think of MRSA.  She does not think it is MRSA at this time and she is also on Unasyn.  She is in agreement  with this and understands that the wound will be left open.  PROCEDURE IN DETAIL:  The patient was taken to the operative suite and then after induction of general anesthesia the time-out was completed. I placed a Foley catheter in and then we prepped the abdomen with Betadine surgical scrub solution and draped in a sterile manner.  I put a hemostat through two of the fistula or wound areas that were draining. There is one kind of midline, basically at the umbilicus.  There is one lateral, that was one of the drains and I think that he was surgically a radiology in place.  I expected that these communicate, but I opened the midline old incision at first and there is just marked fibrosis and then large areas of fat necrosis that obviously is the cause of this failure to heal and continued drainage.  I elected to go ahead and remove all the areas of that fat, fortunately the PTF mesh is incorporated in the scar tissue and I do not see any evidence of any fistula.  I did culture aerobic and anaerobic.  This area lateral, which is about 4 cm from the skin edge with fairly marked necrosis and I  took the little bridge of skin and then removed the fatty tissue.  Now we kind of have got a short T to the right, the area to the left is somewhat fibrotic.  She has had a previous abdominoplasty through a very generous incision and the question is whether to try to get closure completely, would it be better to advance laterally or could we do a little mini-transfer incision.  So the options of how to get closure I am not sure, but I think it would be best to wait for results of the cultures to go ahead and put a wound vac on and then decide whether we are going to do kind of a transverse closure possibly of advancement of lateral since she has got a lot of redundant tissue.  The medium-size wound vac was placed close to just use the sponge and the plastic over it and it sucks down tightly.  Numerous  little blood vessels were sutured either with 3-0 Vicryl or cautery and the debridement and the necrotic skin and fat etc. was sent for pathology exam.     Anselm Pancoast. Zachery Dakins, M.D.     WJW/MEDQ  D:  03/28/2011  T:  03/29/2011  Job:  [id number]  Electronically Signed by Consuello Bossier M.D. on 04/10/2011 08:56:43 AM

## 2011-04-10 NOTE — Consult Note (Signed)
NAMECHRISTEENA, [name]:  [id number]  MEDICAL RECORD NO.:  [id number]  LOCATION:  1534                         FACILITY:  [facility]  PHYSICIAN:  Sara Scott, MD     DATE OF BIRTH:  1951/05/04  DATE OF CONSULTATION: DATE OF DISCHARGE:                                CONSULTATION   REASON FOR CONSULTATION:  Manage medical issues.  HISTORY OF PRESENT ILLNESS:  This 60 year old female with a history of small bowel obstruction, May 2012, thought secondary to adhesions.  The patient was taken to the OR at that time.  The patient developed MRSA and abdominal wall abscess with a drain placed and removed on December 21, 2010.  Apparently, this is the site that has become reinfected and she is back in hospital for I and D.  Dr. Zachery Howe, General Surgery. Surgery requests that [facility] hospitalist to follow along and consult to help manage medical issues.  PAST MEDICAL HISTORY: 1. Acute on chronic renal failure. 2. Metabolic acidosis. 3. Hyperkalemia. 4. Pancytopenia thought to be secondary to multiple myeloma, on     Revlimid per oncology. 5. Anterior abdominal wall wound related to surgical site, prior MRSA     infection. 6. History of DVT, June 2012. 7. History of congestive heart failure. 8. 2-D echocardiogram, December 11, 2010, noted EF 60% to 65% with some     pulmonary hypertension, PA pressure 53 mmHg. 9. Hypertension. 10.Multiple myeloma followed by Dr. Clelia Howe, oncology. 11.Anemia, likely multifactorial. 12.Borderline diabetes. 13.Hyperlipidemia. 14.Insomnia. 15.Chronic Coumadin for history of recent DVT.  PAST SURGICAL HISTORY: 1. Status post panniculectomy. 2. Status post gastric lining. 3. Status post breast reduction. 4. Status post cervical dilation and panniculectomy. 5. Status post right trigger thumb surgery. 6. Exploratory laparotomy with lysis of adhesions. 7. Repair of incarcerated ventral incisional hernia with MTF mesh on     Nov 22, 2010.  ALLERGIES:  MORPHINE that cause hives.  MEDICATIONS:  Home med recollection med reconciliation per current chart: 1. Coumadin 5 mg daily. 2. Vicodin ES 7.5/750 mg 1 tablet every 6 hours as needed. 3. Temazepam 7.5 mg daily at bedtime as needed. 4. Pravachol 40 mg daily at bedtime. 5. Potassium chloride 20 mEq daily. 6. Oxybutynin 5 mg daily. 7. Lasix 40 mg daily. 8. Darbepoetin alfa injection 150 mcg weekly. 9. Bupropion XL 150 mg daily. 10.Atenolol one half of 25 mg tablet daily. 11.Multivitamin daily.  In addition, per hospital records, the patient is on Zosyn, hydromorphone, and mechanical DVT prophylaxis.  FAMILY HISTORY:  Denies any significant familial history.  SOCIAL HISTORY:  The patient lives alone.  She is disabled.  No alcohol, tobacco, or illicit drugs.  REVIEW OF SYSTEMS:  GENERAL:  Other than her surgery, she does not feel unwell.  EYES:  Denies any visual changes.  EARS:  Denies any hearing loss.  NOSE:  Denies sinus drainage.  MOUTH/THROAT:  Has a sore throat postop for which she has an order for Cepacol.  CARDIAC:  History asabove.  Denies any central chest pain or palpitation.  LUNGS:  Denies any dyspnea, orthopnea, cough, or increased sputum.  ABDOMEN:  Denies any nausea or vomiting.  History of surgery as above.  URINARY, GENITAL: Denies any symptoms suggestive of urinary tract infection.  The patient has a Foley catheter.  MUSCULOSKELETAL:  Denies any arthralgias or myalgias currently.  NEUROLOGIC:  Denies any history of stroke or seizure.  ENDOCRINE:  She is a borderline diabetic.  PHYSICAL EXAMINATION:  VITAL SIGNS:  Temperature 98.1, pulse 84, respiration 18, and blood pressure 165/104. GENERAL APPEARANCE:  This is a moderately obese female, in no distress. SKIN:  Turgor adequate.  She has a VAC dressing over the lower abdomen with serosanguineous drainage. EYES:  Pupils are equal. EARS:  Canals clear and hearing normal to conversational  tone. NOSE:  Nares patent without discharge noted.  Oral mucosa pink and moist. NECK:  No jugular venous distention, bruits, or adenopathy. CARDIAC:  Rate and rhythm regular. LUNGS:  Breath sounds clear and equal. ABDOMEN:  Soft with positive bowel sounds.  Surgical site and drainage as described above. URINARY, GENITAL:  No bladder pain or CVA tenderness.  Foley catheter in place draining clear yellow urine. MUSCULOSKELETAL:  Range of motion in all four extremities full. NEUROLOGIC:  The patient is alert and appears oriented.  No focal changes.  LABORATORY DATA AND RADIOLOGY:  A CT scan of the abdomen and pelvis, March 28, 2011, notes anterior abdominal subcutaneous fat with air- fluid level.  Similar appearance to previous study.  Nonspecific low attenuation in spleen.  Bony changes consistent with history of multiple myeloma.  MSR screening positive.  PT/INR 17.6 and 1.42 respectively. Comprehensive metabolic panel:  Sodium 140, potassium 3.9, chloride 106, CO2 of 25, BUN 15, creatinine 1.07.  Glucose 88.  GFR 64.  Her liver function unremarkable, but albumin low at 2.8.  CBC, WBC 5.2, hemoglobin low at 10.4, hematocrit low at 33.0, and platelets 114. IMPRESSION/PLAN: 1. Hypertension.  I have restarted her atenolol at 12.5 mg daily.     Also, her Lasix at 40 mg daily and K plus supplementation at 20 mEq     daily.  I left an order for hydralazine p.r.n.  Systolic blood     pressure greater than 160, diastolic greater than 100.  Follow for     a.m. metabolic panel. 2. History of deep venous thrombosis.  Currently on sequential     compression devices.  We would restart her Coumadin and use a     bridge of heparin or Lovenox when surgery feels that this is safe     postoperatively. 3. Borderline diabetes.  We will check her CBGs q.4 hours.  The     patient states she is currently on a liquid diet.  If her diet is     advanced, we can change to a.c. in bedtime, and I have used a      sensitive NovoLog sliding scale. 4. History of congestive heart failure.  Ejection fraction of 60% to     65%.  There is some pulmonary hypertension.  Would be conservative     with IV fluids and follow on weights.  B natriuretic peptide is     pending in a.m. 5. Chronic renal insufficiency.  We will follow with a metabolic panel     in a.m. 6. Multiple myeloma.  Followed by Dr. Clelia Howe.  Was on Revlimid, but     not discharged on this per February 11, 2011.  May need to consult     Oncology in a.m. for their help in managing the patient as well. 7. Anemia.  Preoperative hemoglobin 10.4.  we will follow for a.m.  CBC. 8. Abdominal wound.  She is on antibiotics with Zosyn.  Status post     incision and drainage today.  We will follow for cultures.  Noting     serosanguineous drainage from her back type apparatus.     Everett Graff, N.P.   ______________________________ Sara Scott, MD    TC/MEDQ  D:  03/28/2011  T:  03/29/2011  Job:  [id number]  cc:   Benjiman Core, M.D. Fax: [phone]  Anselm Pancoast. [name], M.D. [address] [place] [address]  Dr. Clelia Howe  Electronically Signed by Everett Graff N.P. on 03/29/2011 06:55:45 PM Electronically Signed by Sara Scott MD on 04/10/2011 08:25:35 PM

## 2011-04-11 LAB — RENAL FUNCTION PANEL
Albumin: 3 — ABNORMAL LOW
Albumin: 3.1 — ABNORMAL LOW
Albumin: 3.2 — ABNORMAL LOW
Albumin: 3.4 — ABNORMAL LOW
BUN: 60 — ABNORMAL HIGH
BUN: 69 — ABNORMAL HIGH
BUN: 79 — ABNORMAL HIGH
CO2: 21
CO2: 21
Calcium: 10.1
Chloride: 111
Chloride: 111
Chloride: 113 — ABNORMAL HIGH
Chloride: 115 — ABNORMAL HIGH
Creatinine, Ser: 5.59 — ABNORMAL HIGH
Creatinine, Ser: 6.01 — ABNORMAL HIGH
Creatinine, Ser: 6.18 — ABNORMAL HIGH
Creatinine, Ser: 6.45 — ABNORMAL HIGH
GFR calc Af Amer: 10 — ABNORMAL LOW
GFR calc Af Amer: 8 — ABNORMAL LOW
GFR calc Af Amer: 9 — ABNORMAL LOW
GFR calc non Af Amer: 7 — ABNORMAL LOW
GFR calc non Af Amer: 7 — ABNORMAL LOW
GFR calc non Af Amer: 8 — ABNORMAL LOW
Glucose, Bld: 115 — ABNORMAL HIGH
Glucose, Bld: 84
Phosphorus: 5.8 — ABNORMAL HIGH
Phosphorus: 5.8 — ABNORMAL HIGH
Phosphorus: 6.4 — ABNORMAL HIGH
Potassium: 4.7
Potassium: 4.7
Potassium: 4.8
Potassium: 5.3 — ABNORMAL HIGH
Sodium: 142

## 2011-04-11 LAB — BASIC METABOLIC PANEL
CO2: 20
Calcium: 9.7
Calcium: 9.8
Creatinine, Ser: 7 — ABNORMAL HIGH
GFR calc Af Amer: 7 — ABNORMAL LOW
GFR calc non Af Amer: 6 — ABNORMAL LOW
Glucose, Bld: 95
Sodium: 143

## 2011-04-11 LAB — RETICULOCYTES
RBC.: 2.91 — ABNORMAL LOW
RBC.: 3.45 — ABNORMAL LOW
Retic Count, Absolute: 13.8 — ABNORMAL LOW
Retic Ct Pct: 0.2 — ABNORMAL LOW
Retic Ct Pct: 0.4

## 2011-04-11 LAB — URINALYSIS, ROUTINE W REFLEX MICROSCOPIC
Bilirubin Urine: NEGATIVE
Nitrite: NEGATIVE
Protein, ur: 30 — AB
Specific Gravity, Urine: 1.012
Urobilinogen, UA: 0.2
Urobilinogen, UA: 0.2
pH: 6.5

## 2011-04-11 LAB — DIFFERENTIAL
Eosinophils Absolute: 0.1
Eosinophils Relative: 1
Lymphocytes Relative: 21
Lymphs Abs: 1.2
Monocytes Absolute: 0.6
Monocytes Relative: 11

## 2011-04-11 LAB — C3 COMPLEMENT: C3 Complement: 168

## 2011-04-11 LAB — CBC
HCT: 28.1 % — ABNORMAL LOW (ref 36.0–46.0)
HCT: 21.9 — ABNORMAL LOW
HCT: 28.2 — ABNORMAL LOW
Hemoglobin: 9 g/dL — ABNORMAL LOW (ref 12.0–15.0)
Hemoglobin: 7.4 — CL
Hemoglobin: 9.3 — ABNORMAL LOW
Hemoglobin: 9.6 — ABNORMAL LOW
MCHC: 32 g/dL (ref 30.0–36.0)
MCHC: 32.8
MCHC: 33
MCHC: 33.2
MCHC: 33.7
MCHC: 33.9
MCV: 90.6 fL (ref 78.0–100.0)
MCV: 82.8
MCV: 83.1
MCV: 83.7
Platelets: 108 — ABNORMAL LOW
Platelets: 110 — ABNORMAL LOW
Platelets: 128 — ABNORMAL LOW
Platelets: 96 — ABNORMAL LOW
Platelets: 97 — ABNORMAL LOW
RBC: 2.63 — ABNORMAL LOW
RBC: 3.19 — ABNORMAL LOW
RBC: 3.3 — ABNORMAL LOW
RBC: 3.56 — ABNORMAL LOW
RDW: 18.5 % — ABNORMAL HIGH (ref 11.5–15.5)
RDW: 18.5 — ABNORMAL HIGH
RDW: 18.9 — ABNORMAL HIGH
RDW: 20.2 — ABNORMAL HIGH
RDW: 20.5 — ABNORMAL HIGH
WBC: 4.4
WBC: 5.6
WBC: 8.4

## 2011-04-11 LAB — COMPREHENSIVE METABOLIC PANEL
ALT: 50 U/L — ABNORMAL HIGH (ref 0–35)
ALT: 16
AST: 35 U/L (ref 0–37)
AST: 20
Albumin: 3.3 — ABNORMAL LOW
Albumin: 3.7
BUN: 80 — ABNORMAL HIGH
CO2: 24 mEq/L (ref 19–32)
Calcium: 8.9 mg/dL (ref 8.4–10.5)
Calcium: 10.1
Calcium: 10.5
Chloride: 107 mEq/L (ref 96–112)
Creatinine, Ser: 1.21 mg/dL — ABNORMAL HIGH (ref 0.50–1.10)
Creatinine, Ser: 6.81 — ABNORMAL HIGH
Creatinine, Ser: 8.26 — ABNORMAL HIGH
GFR calc Af Amer: 55 mL/min — ABNORMAL LOW (ref >90)
GFR calc Af Amer: 6 — ABNORMAL LOW
GFR calc non Af Amer: 48 mL/min — ABNORMAL LOW (ref >90)
Glucose, Bld: 90 mg/dL (ref 70–99)
Glucose, Bld: 102 — ABNORMAL HIGH
Sodium: 139
Total Bilirubin: 0.2 mg/dL — ABNORMAL LOW (ref 0.3–1.2)
Total Protein: 6.6

## 2011-04-11 LAB — PROTEIN ELECTROPH W RFLX QUANT IMMUNOGLOBULINS
Alpha-2-Globulin: 17.6 — ABNORMAL HIGH
Gamma Globulin: 12.6

## 2011-04-11 LAB — CK TOTAL AND CKMB (NOT AT ARMC)
CK, MB: 1.7
Relative Index: INVALID
Total CK: 85

## 2011-04-11 LAB — PROTIME-INR: INR: 1.83 — ABNORMAL HIGH (ref 0.00–1.49)

## 2011-04-11 LAB — HEPARIN ANTI-XA: Heparin LMW: 0.27

## 2011-04-11 LAB — IRON AND TIBC
Iron: 68
Saturation Ratios: 34
TIBC: 201 — ABNORMAL LOW
UIBC: 133

## 2011-04-11 LAB — ANA
Anti Nuclear Antibody(ANA): NEGATIVE
Anti Nuclear Antibody(ANA): NEGATIVE

## 2011-04-11 LAB — URINE MICROSCOPIC-ADD ON: Urine-Other: NONE SEEN

## 2011-04-11 LAB — CROSSMATCH: Antibody Screen: NEGATIVE

## 2011-04-11 LAB — URINE CULTURE

## 2011-04-11 LAB — ANTI-NEUTROPHIL ANTIBODY: Cytoplasmic Neutrophilic Ab: <1:20 {titer}

## 2011-04-11 LAB — GLUCOSE, CAPILLARY
Glucose-Capillary: 93 mg/dL (ref 70–99)
Glucose-Capillary: 85 mg/dL (ref 70–99)
Glucose-Capillary: 133 mg/dL — ABNORMAL HIGH (ref 70–99)

## 2011-04-11 LAB — CARDIAC PANEL(CRET KIN+CKTOT+MB+TROPI)
Relative Index: INVALID
Relative Index: INVALID
Total CK: 79
Troponin I: 0.04

## 2011-04-11 LAB — SEDIMENTATION RATE: Sed Rate: 95 — ABNORMAL HIGH

## 2011-04-11 LAB — HEPATITIS PANEL, ACUTE: HCV Ab: NEGATIVE

## 2011-04-11 LAB — RAPID URINE DRUG SCREEN, HOSP PERFORMED
Amphetamines: NOT DETECTED
Benzodiazepines: NOT DETECTED

## 2011-04-11 LAB — TROPONIN I: Troponin I: 0.05

## 2011-04-11 LAB — SODIUM, URINE, RANDOM: Sodium, Ur: 85

## 2011-04-11 LAB — ABO/RH: ABO/RH(D): O POS

## 2011-04-11 LAB — TECHNOLOGIST SMEAR REVIEW

## 2011-04-11 LAB — TSH: TSH: 0.633

## 2011-04-11 LAB — LIPID PANEL
Cholesterol: 151
HDL: 30 — ABNORMAL LOW
LDL Cholesterol: 95

## 2011-04-11 LAB — C4 COMPLEMENT: Complement C4, Body Fluid: 45

## 2011-04-11 LAB — URIC ACID: Uric Acid, Serum: 6.6

## 2011-04-11 LAB — FERRITIN: Ferritin: 719 — ABNORMAL HIGH (ref 10–291)

## 2011-04-11 LAB — IGG, IGA, IGM
IgA: 42 — ABNORMAL LOW
IgG (Immunoglobin G), Serum: 758

## 2011-04-12 LAB — GLUCOSE, CAPILLARY: Glucose-Capillary: 107 mg/dL — ABNORMAL HIGH (ref 70–99)

## 2011-04-13 DIAGNOSIS — B958 Unspecified staphylococcus as the cause of diseases classified elsewhere: Secondary | ICD-10-CM

## 2011-04-13 LAB — CBC
Hemoglobin: 8.3 g/dL — ABNORMAL LOW (ref 12.0–15.0)
Platelets: 256 10*3/uL (ref 150–400)
RBC: 2.93 MIL/uL — ABNORMAL LOW (ref 3.87–5.11)
WBC: 3.1 10*3/uL — ABNORMAL LOW (ref 4.0–10.5)

## 2011-04-13 LAB — GLUCOSE, CAPILLARY
Glucose-Capillary: 69 mg/dL — ABNORMAL LOW (ref 70–99)
Glucose-Capillary: 86 mg/dL (ref 70–99)

## 2011-04-13 LAB — PROTIME-INR
INR: 2.19 — ABNORMAL HIGH (ref 0.00–1.49)
Prothrombin Time: 24.7 seconds — ABNORMAL HIGH (ref 11.6–15.2)

## 2011-04-14 LAB — PROTIME-INR: INR: 2.09 — ABNORMAL HIGH (ref 0.00–1.49)

## 2011-04-14 LAB — GLUCOSE, CAPILLARY
Glucose-Capillary: 84 mg/dL (ref 70–99)
Glucose-Capillary: 97 mg/dL (ref 70–99)
Glucose-Capillary: 90 mg/dL (ref 70–99)

## 2011-04-15 LAB — GLUCOSE, CAPILLARY
Glucose-Capillary: 69 mg/dL — ABNORMAL LOW (ref 70–99)
Glucose-Capillary: 75 mg/dL (ref 70–99)
Glucose-Capillary: 113 mg/dL — ABNORMAL HIGH (ref 70–99)
Glucose-Capillary: 121 mg/dL — ABNORMAL HIGH (ref 70–99)

## 2011-04-16 LAB — CBC
HCT: 30.9 % — ABNORMAL LOW (ref 36.0–46.0)
Hemoglobin: 9.7 g/dL — ABNORMAL LOW (ref 12.0–15.0)
MCHC: 31.4 g/dL (ref 30.0–36.0)
MCV: 91.4 fL (ref 78.0–100.0)
RDW: 18.6 % — ABNORMAL HIGH (ref 11.5–15.5)

## 2011-04-16 LAB — GLUCOSE, CAPILLARY

## 2011-04-17 LAB — GLUCOSE, CAPILLARY: Glucose-Capillary: 113 mg/dL — ABNORMAL HIGH (ref 70–99)

## 2011-04-17 LAB — PROTIME-INR
INR: 2.34 — ABNORMAL HIGH (ref 0.00–1.49)
Prothrombin Time: 26 seconds — ABNORMAL HIGH (ref 11.6–15.2)

## 2011-04-18 ENCOUNTER — Encounter (INDEPENDENT_AMBULATORY_CARE_PROVIDER_SITE_OTHER): Payer: Medicaid Other | Admitting: General Surgery

## 2011-04-18 LAB — GLUCOSE, CAPILLARY
Glucose-Capillary: 71 mg/dL (ref 70–99)
Glucose-Capillary: 83 mg/dL (ref 70–99)
Glucose-Capillary: 96 mg/dL (ref 70–99)

## 2011-04-19 LAB — CBC
HCT: 30 % — ABNORMAL LOW (ref 36.0–46.0)
Hemoglobin: 9.5 g/dL — ABNORMAL LOW (ref 12.0–15.0)
MCH: 28.8 pg (ref 26.0–34.0)
MCHC: 31.7 g/dL (ref 30.0–36.0)
RDW: 18.6 % — ABNORMAL HIGH (ref 11.5–15.5)

## 2011-04-19 LAB — PROTIME-INR: INR: 2.38 — ABNORMAL HIGH (ref 0.00–1.49)

## 2011-04-19 LAB — GLUCOSE, CAPILLARY: Glucose-Capillary: 109 mg/dL — ABNORMAL HIGH (ref 70–99)

## 2011-04-20 LAB — GLUCOSE, CAPILLARY
Glucose-Capillary: 104 mg/dL — ABNORMAL HIGH (ref 70–99)
Glucose-Capillary: 95 mg/dL (ref 70–99)

## 2011-04-20 LAB — PROTIME-INR: INR: 2.62 — ABNORMAL HIGH (ref 0.00–1.49)

## 2011-04-21 LAB — GLUCOSE, CAPILLARY: Glucose-Capillary: 103 mg/dL — ABNORMAL HIGH (ref 70–99)

## 2011-04-21 LAB — PROTIME-INR: INR: 2.64 — ABNORMAL HIGH (ref 0.00–1.49)

## 2011-04-22 ENCOUNTER — Telehealth (INDEPENDENT_AMBULATORY_CARE_PROVIDER_SITE_OTHER): Payer: Self-pay | Admitting: General Surgery

## 2011-04-22 LAB — GLUCOSE, CAPILLARY: Glucose-Capillary: 100 mg/dL — ABNORMAL HIGH (ref 70–99)

## 2011-04-22 LAB — CBC
HCT: 27 % — ABNORMAL LOW (ref 36.0–46.0)
Hemoglobin: 8.5 g/dL — ABNORMAL LOW (ref 12.0–15.0)
MCHC: 31.5 g/dL (ref 30.0–36.0)
RDW: 18.8 % — ABNORMAL HIGH (ref 11.5–15.5)
WBC: 3.6 10*3/uL — ABNORMAL LOW (ref 4.0–10.5)

## 2011-04-22 LAB — PROTIME-INR
INR: 2.49 — ABNORMAL HIGH (ref 0.00–1.49)
Prothrombin Time: 27.3 seconds — ABNORMAL HIGH (ref 11.6–15.2)

## 2011-04-24 ENCOUNTER — Other Ambulatory Visit: Payer: Self-pay | Admitting: Oncology

## 2011-04-25 NOTE — Discharge Summary (Signed)
[name], BARNICK NO.:  [id number]  MEDICAL RECORD NO.:  [id number]  LOCATION:  1534                         FACILITY:  [facility]  PHYSICIAN:  Lodema Pilot, MD       DATE OF BIRTH:  [birth year]  DATE OF ADMISSION:  03/27/2011 DATE OF DISCHARGE:  04/22/2011                        DISCHARGE SUMMARY - REFERRING   PRIMARY CARE:  Dr. Sherryll Burger at [facility].  ONCOLOGY:  Benjiman Core, M.D.  ADMISSION DIAGNOSES: 1. 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 2. History of small bowel obstruction secondary to ventral hernia with     ventral hernia repair (exploratory laparotomy and  lysis of     adhesions, repair of ventral hernia with MTF mesh, Nov 21, 2010,     Dr. Abbey Chatters).  Ultrasound guided aspiration and drainage of     abdominal wall abscess, June 14 and December 11, 2010. 3. Adult-onset diabetes mellitus. 4. Multiple myeloma, Dr. Clelia Croft, Oncology. 5. History of deep venous thrombosis on Coumadin  since June 2012. 6. Anemia, pancytopenia secondary to multiple myeloma on Revlimid, Dr.     Clelia Croft. 7. Hypertension. 8. History of congestive heart failure. 9. History of insomnia. 10.Body mass index of 38.  DISCHARGE DIAGNOSES: 1. 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 2. History of small bowel obstruction secondary to ventral hernia with     ventral hernia repair (exploratory laparotomy and lysis of     adhesions, repair of ventral hernia with MTF mesh, Nov 21, 2010,     Dr. Abbey Chatters).  Ultrasound guided aspiration and drainage of     abdominal wall abscess, June 14 and December 11, 2010. 3. Adult-onset diabetes mellitus. 4. Multiple myeloma, Dr. Clelia Croft, Oncology. 5. History of deep venous thrombosis on Coumadin  since June 2012. 6. Anemia, pancytopenia secondary to multiple myeloma on Revlimid, Dr.     Clelia Croft. 7. Hypertension. 8. History of congestive heart failure. 9.  History of insomnia. 10.Body mass index of 38.  PROCEDURES: 1. Exploration and wound debridement, placement of a wound VAC,     March 29, 2011, Dr. Zachery Dakins. 2. She was changed to wet-to-dry dressing secondary to bleeding within     the wound, April 08, 2011.  CONSULTS:  Dr. Waymon Amato, hospitalist.  BRIEF HISTORY:  The patient is a 60 year old female with multiple medical issues as noted above.  She was hospitalized in May with a small bowel obstruction and incarceration.  She was operated on by Dr. Abbey Chatters and this was repaired. Postoperative course was difficult and complicated by DVT as well as an abdominal wall fluid collection.  As noted above in June, this was drained and she was treated medically and she returned to the office for evaluation on March 27, 2011 where she saw Dr. Emelia Loron and again had a large fluid collection, which appeared to be infected.  She was subsequently admitted from the office for in-hospital treatment.  PAST MEDICAL HISTORY:  As above and also included: 1. History of chronic renal insufficiency. 2. Congestive failure. 3. GERD. 4. Renal insufficiency. 5. Obesity. 6. Depression. 7. Anxiety.  HOSPITAL COURSE:  Patient was seen on admission by Dr. Consuello Bossier and it  was his opinion the best way to treat this was open it completely and have it heal in.  We subsequently arranged and the patient was taken to the OR where she underwent exploration of the wound, debridement of necrotic fat and placement of a wound VAC.  Large area was opened and left open.  The wound VAC was placed.  She was treated with IV antibiotics.  Her multiple medical issues were seen and addressed by Dr. Waymon Amato of the hospitalist service.  She was kept on Lovenox for her deep venous thrombosis prophylaxis initially and then slowly restarted back on Coumadin.  She had no significant medical issues during her hospitalization.  We were aiming to get her  home after her INR got up around April 08, 2011.  At that time, she had some bleeding from the [facility] site and we took the dressing down and she had a large amount of clotted blood, which was removed manually and then irrigated.  At that point, we went to wet-to-dry dressings.  It was necessary at that point to go to t.i.d. dressing changes.  She also had difficulty with getting her INR up to a therapeutic level.  The pharmacy handled this, but she was apparently on 5 mg a day when she came in.  She was not therapeutic on admission.  Protime was 17.6 seconds, INR was 1.42.  She was ultimately gradually increased and she is currently taking 12.5 mg of Coumadin per day.  Her INR has ranged from 2.16 to 2.62 over the last 5 days on these doses.  On Friday, April 19, 2011, her wound had improved adequately that it was felt that a VAC could be put back on the wound.  She had been taken off IV antibiotics and was on Cleocin for about 2 weeks.  She has been off Cleocin since for over a week now.  The wound continues to do well and by April 22, 2011, it is Dr. Doreen Salvage and Dr. Delice Lesch opinion that she is ready for discharge. We attempted to discharge her earlier.  She could not afford home health to come out 3 times a day and she was not willing to go to the 1 Medicare Skilled Nursing Facility that could provide dressing changes. With the [facility] treatment, we can get home health in and they can set up home health aids as needed.  FOLLOW UP:  We will have her come back and see Dr. Abbey Chatters in approximately 2 weeks.  She is to contact Dr. Clelia Croft and get her Coumadin level checked on April 24, 2011 at which point Dr. Clelia Croft can resume care of her Coumadin.  She has also been on darbepoetin injections preadmission.  We have continued those in the hospital and we will let Dr. Clelia Croft resume that care also.  Her primary care physician is Dr. Sherryll Burger at [facility].  Patient will be instructed to  contact each and follow up with them.  We will have her follow up in our office in 7-10 days also.  DISCHARGE MEDICATIONS:  She will continue: 1. Atenolol half a tablet daily. 2. Bupropion XL 150 mg daily. 3. Darbepoetin injections weekly. 4. Lasix 40 mg daily. 5. Multivitamin 1 daily. 6. Oxybutynin 5 mg daily. 7. Potassium chloride 20 mEq daily. 8. Pravachol 40 mg daily. 9. Restoril 7.5 mg h.s. 10.Vicodin ES 7.5/750 one q.6 p.r.n. 11.She may resume Benadryl 25 mg tablets, half to one tablet q.6     p.r.n. 12.Ferrous sulfate 325 mg b.i.d. with  meals. 13.MiraLAX 17 g p.o. p.r.n. for constipation. 14.Phenergan 25 mg q.4 p.r.n. for nausea. 15.Her Coumadin instructions is: Warfarin 5 mg, she is to take 2.5     tablets by mouth daily or as instructed by Dr. Clelia Croft.  DISCHARGE LABS:  Her protime, April 22, 2011 is 27.3 with an INR of 2.49. On October 28, her INR was 2.64.  On October 27, her INR was 2.62. On October 26, INR was 2.38.  White count is 3.6, hemoglobin is 8.5, hematocrit is 27, platelets are 242,000.  CMP on April 11, 2011 shows a sodium of 138, a potassium of 4.2, a chloride of 107, CO2 of 24, BUN of 15, a creatinine of 1.21, and a glucose of 90.  Of note, her glucoses have remained essentially normal on a regular diet despite a diagnosis of diabetes.  Total bilirubin is 0.2, alk phos is 154, SGOT is 35, SGPT is 50, total protein 6.7, albumin is 2.8, calcium is 8.9.     Eber Hong, P.A.   ______________________________ Lodema Pilot, MD    WDJ/MEDQ  D:  04/22/2011  T:  04/22/2011  Job:  [id number]  cc:   Benjiman Core, M.D. Fax: [phone]  Dr. Sherryll Burger at [facility]  Electronically Signed by Sherrie George P.A. on 04/22/2011 03:04:22 PM Electronically Signed by Lodema Pilot DO on 04/25/2011 10:56:47 AM

## 2011-04-29 ENCOUNTER — Other Ambulatory Visit (INDEPENDENT_AMBULATORY_CARE_PROVIDER_SITE_OTHER): Payer: Self-pay

## 2011-04-29 ENCOUNTER — Telehealth (INDEPENDENT_AMBULATORY_CARE_PROVIDER_SITE_OTHER): Payer: Self-pay

## 2011-04-29 NOTE — Telephone Encounter (Signed)
This should be [place].

## 2011-04-29 NOTE — Telephone Encounter (Signed)
Lewie Loron from [facility] request order for [employer]. Please see if Dr Dwain Sarna will ok order.  Please call Brittin at [phone] ext [phone].

## 2011-04-30 ENCOUNTER — Telehealth (INDEPENDENT_AMBULATORY_CARE_PROVIDER_SITE_OTHER): Payer: Self-pay | Admitting: General Surgery

## 2011-04-30 ENCOUNTER — Encounter: Payer: Self-pay | Admitting: *Deleted

## 2011-04-30 NOTE — Telephone Encounter (Signed)
Britin with [facility] is requesting an order for a transfer bench, [id number] ext [phone]

## 2011-04-30 NOTE — Telephone Encounter (Signed)
[facility] is requesting an order for a transfer bench

## 2011-05-01 ENCOUNTER — Encounter: Payer: Self-pay | Admitting: *Deleted

## 2011-05-02 ENCOUNTER — Ambulatory Visit (HOSPITAL_BASED_OUTPATIENT_CLINIC_OR_DEPARTMENT_OTHER): Payer: Medicaid Other | Admitting: Oncology

## 2011-05-02 ENCOUNTER — Telehealth: Payer: Self-pay | Admitting: Oncology

## 2011-05-02 ENCOUNTER — Other Ambulatory Visit: Payer: Medicaid Other | Admitting: Lab

## 2011-05-02 VITALS — BP 126/80 | HR 73 | Temp 98.1°F

## 2011-05-02 DIAGNOSIS — I10 Essential (primary) hypertension: Secondary | ICD-10-CM

## 2011-05-02 DIAGNOSIS — Z86718 Personal history of other venous thrombosis and embolism: Secondary | ICD-10-CM

## 2011-05-02 DIAGNOSIS — D649 Anemia, unspecified: Secondary | ICD-10-CM

## 2011-05-02 DIAGNOSIS — C9000 Multiple myeloma not having achieved remission: Secondary | ICD-10-CM

## 2011-05-02 LAB — CBC WITH DIFFERENTIAL/PLATELET
Basophils Absolute: 0 10*3/uL (ref 0.0–0.1)
Eosinophils Absolute: 0 10*3/uL (ref 0.0–0.5)
HGB: 10.6 g/dL — ABNORMAL LOW (ref 11.6–15.9)
MONO#: 0.3 10*3/uL (ref 0.1–0.9)
MONO%: 8.5 % (ref 0.0–14.0)
NEUT#: 1.8 10*3/uL (ref 1.5–6.5)
RBC: 3.61 10*6/uL — ABNORMAL LOW (ref 3.70–5.45)
RDW: 20.3 % — ABNORMAL HIGH (ref 11.2–14.5)
WBC: 3.1 10*3/uL — ABNORMAL LOW (ref 3.9–10.3)
lymph#: 1 10*3/uL (ref 0.9–3.3)

## 2011-05-02 LAB — COMPREHENSIVE METABOLIC PANEL
Alkaline Phosphatase: 76 U/L (ref 39–117)
CO2: 21 mEq/L (ref 19–32)
Creatinine, Ser: 1.25 mg/dL — ABNORMAL HIGH (ref 0.50–1.10)
Glucose, Bld: 70 mg/dL (ref 70–99)
Sodium: 141 mEq/L (ref 135–145)
Total Bilirubin: 0.3 mg/dL (ref 0.3–1.2)
Total Protein: 6.8 g/dL (ref 6.0–8.3)

## 2011-05-02 LAB — PROTIME-INR
INR: 1.2 — ABNORMAL LOW (ref 2.00–3.50)
Protime: 14.4 Seconds — ABNORMAL HIGH (ref 10.6–13.4)

## 2011-05-02 NOTE — Telephone Encounter (Signed)
gve rhe pt her dec 2012 appt calendar

## 2011-05-02 NOTE — Progress Notes (Signed)
Hematology and Oncology Follow Up Visit  [name] [id number] [birth year] 59 y.o. 05/02/2011 4:19 PM  CC: [facility]   Principle Diagnosis:  This is a 60 year old female with the following issues:   1. 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  She had a free kappa light chain around 1500. 2. Diagnosis of deep vein thrombosis in July 2011.    Prior Therapy:    1. Initially treated with melphalan and prednisone.  She had a partial response with free light chains down as low as 19, M spike down to 0.52 g/dL.  2. Patient treated with Revlimid at 25 mg 3 weeks on and 1 week off between November 2009 until November 2010.  She had a partial response, light chains down to 33 and M spike was not detected.   3. She was treated on maintenance Revlimid 15 mg to maintain her partial response. 4. She was on Zometa intermittently, on hold know. 5. Currently anticoagulated with Coumadin 7.5 mg since July 2011.  Current therapy :She is on Velcade 1.3 mg/sq m for a total of 2.9 mg weekly, which started on 02/21/2011 with weekly dexamethasone .  Treatment has been on hold due to recurrent hospitalization for wound infection.   Interim History:  :  Ms. Lasker presents today for an office followup visit.  This is a pleasant 60 year old female with a few comorbid conditions as outlined above.  She was hospitalized due to an abdominal wound infection, at which time she had presented with nausea, vomiting, abdominal pain, and at that time found to have increased secretions from her abdominal wound.  She was also found to be in renal failure. She was hospitalized between 10/3 till 10/29 .  Her acute renal failure has resolved.   She was discharged with a wound VAC. She has ben doing well since her recent discharge. She does not report any obvious bleeding such as epistaxis, gum bleeding, hemoptysis, melena,  hematochezia or hematuria.  She does have some intermittent swelling in her right lower extremity.  She does not report any headaches, visual changes, any dysphagia, odynophagia, any nausea, vomiting, diarrhea, constipation, chest pain, shortness of breath, productive cough, abdominal pain, abdominal swelling, lower extremity paresthesias, bowel or bladder incontinence.  She denies any fevers, chills or night sweats.  Overall, she is tolerating her Velcade-based therapy quite well without any major toxicities.  She also remains on Aranesp 150 mcg subcu weekly to keep her hemoglobin close to 11.  Overall she reports she is eating and drinking relatively well.  She has lost quite a bit of weight with her hospitalizations.  Medications: I have reviewed the patient's current medications.  Allergies:  Allergies  Allergen Reactions  . Ibuprofen Other (See Comments)    Patient does not remember.   . Morphine And Related Other (See Comments)    Patient does not remember reaction, believes it caused raised marks on skin.    Past Medical History, Surgical history, Social history, and Family History were reviewed and updated.  Review of Systems: Constitutional:  Negative for fever, chills, night sweats, anorexia, weight loss, pain. Cardiovascular: no chest pain or dyspnea on exertion Respiratory: no cough, shortness of breath, or wheezing Neurological: no TIA or stroke symptoms Dermatological: negative ENT: negative Skin: Negative. Gastrointestinal: no abdominal pain, change in bowel habits, or black or bloody stools Genito-Urinary: no dysuria, trouble voiding, or hematuria Hematological and Lymphatic: negative  Breast: negative for breast lumps negative Musculoskeletal: negative Remaining ROS negative. Physical Exam: Blood pressure 126/80, pulse 73, temperature 98.1 F (36.7 C), temperature source Oral. ECOG:  General appearance: alert Head: Normocephalic, without obvious abnormality,  atraumatic Neck: no adenopathy, no carotid bruit, no JVD, supple, symmetrical, trachea midline and thyroid not enlarged, symmetric, no tenderness/mass/nodules Lymph nodes: Cervical, supraclavicular, and axillary nodes normal. Heart:regular rate and rhythm, S1, S2 normal, no murmur, click, rub or gallop Lung:chest clear, no wheezing, rales, normal symmetric air entry, Heart exam - S1, S2 normal, no murmur, no gallop, rate regular Abdomin: soft, non-tender, without masses or organomegaly EXT:no erythema, induration, or nodules   Lab Results: Lab Results  Component Value Date   WBC 3.6* 04/22/2011   HGB 8.5* 04/22/2011   HCT 27.0* 04/22/2011   MCV 91.5 04/22/2011   PLT 242 04/22/2011     Chemistry      Component Value Date/Time   NA 139 04/11/2011 0515   K 4.2 04/11/2011 0515   CL 107 04/11/2011 0515   CO2 24 04/11/2011 0515   BUN 15 04/11/2011 0515   CREATININE 1.21* 04/11/2011 0515      Component Value Date/Time   CALCIUM 8.9 04/11/2011 0515   ALKPHOS 154* 04/11/2011 0515   AST 35 04/11/2011 0515   ALT 50* 04/11/2011 0515   BILITOT 0.2* 04/11/2011 0515         Impression and Plan:  1. Light chain multiple myeloma.  Her last serum light chains done on 02/21/2011 show a kappa free light chain of 0.56 mg/dL, it was 914 back on July 17th, IgA is 74, IgM is 14, IgG is 1400.  Again, she was on Revlimid; however, we decide to go ahead and switch her to [facility] regimen, and overall she is tolerating it quite well.  She is also on oral dexamethasone.  Treatment on hold due to recurrent wound infection. I will  Resume treatment once her wound is fully healed. 2. Anemia, multifactorial.  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  We will continue to proceed with Aranesp 150 mcg subcu on a weekly basis as needed. 3. History of deep vein thrombosis.  She is on Coumadin 5 mg.  We will continue to monitor her PT and INR.   4. Abdominal wound infection.  The infection seems to have resolving with a wound VAC. 5. Hypertension seems to be under relatively good control.  6. DVT on coumadin at this time.     Eli Hose, MD 11/8/20124:19 PM

## 2011-05-09 ENCOUNTER — Telehealth (INDEPENDENT_AMBULATORY_CARE_PROVIDER_SITE_OTHER): Payer: Self-pay

## 2011-05-09 NOTE — Telephone Encounter (Signed)
Spoke with Britin at [facility] today about an order for a transfer bench for the patient.  I gave a verbal ok.

## 2011-05-09 NOTE — Telephone Encounter (Signed)
[name] with [facility] was told that Dr. Abbey Chatters  recommends wet to dry dressing changing for wound care. The calcium alginate had not worked well for the patient in the past. I suggested that maybe she has a friend, relative, or neighbor that can be taught dressing changes when [facility] is not available.  [name] said she will look into the matter.  She has a nurse visit tomorrow.

## 2011-05-09 NOTE — Telephone Encounter (Signed)
[name] from [facility]: [name] wound is 2 cm x 6 cm x 2 cm.- Currently has a wound vac- Healing very well- Nurse is concerned about safety of patient-because patient is over weight, can not see long tubing or wound. She lives alone and uses a walker.  She would like a verbal to D/C vac and use calcium alginate- with visits 3 times per week.

## 2011-05-13 ENCOUNTER — Ambulatory Visit (INDEPENDENT_AMBULATORY_CARE_PROVIDER_SITE_OTHER): Payer: Medicaid Other | Admitting: General Surgery

## 2011-05-13 ENCOUNTER — Encounter (INDEPENDENT_AMBULATORY_CARE_PROVIDER_SITE_OTHER): Payer: Self-pay | Admitting: General Surgery

## 2011-05-13 VITALS — BP 148/80 | HR 68 | Temp 96.8°F | Resp 16 | Ht 68.0 in | Wt 265.4 lb

## 2011-05-13 DIAGNOSIS — Z9889 Other specified postprocedural states: Secondary | ICD-10-CM

## 2011-05-13 NOTE — Progress Notes (Signed)
[name] is here for a wound check. She has a VAC in the open portion of her lower midline wound. It has not been changed in 4 days. She states that he has a bad odor to it.  Exam:  General-obese female in no acute distress.  Abdomen- back on with thin brown drainage and minimal abdominal tenderness.  Assessment: Open wound healing with VAC. She needs to have that changed today.  Plan: We'll call [facility] and have changed the VAC today.  Will then have it changed every Mon/Wed/Fri.  Return visit in 3 weeks.

## 2011-05-13 NOTE — Patient Instructions (Signed)
They will start changing the VAC on Monday, Wednesday and Friday.

## 2011-05-20 ENCOUNTER — Encounter (INDEPENDENT_AMBULATORY_CARE_PROVIDER_SITE_OTHER): Payer: Medicaid Other | Admitting: General Surgery

## 2011-05-24 ENCOUNTER — Telehealth (INDEPENDENT_AMBULATORY_CARE_PROVIDER_SITE_OTHER): Payer: Self-pay | Admitting: General Surgery

## 2011-05-24 NOTE — Telephone Encounter (Signed)
LMOM that Dr. Abbey Chatters does NOT want the wound vac removed.  The patient has a tendency to "pick" at her wound, and he is worried about infection.

## 2011-05-24 NOTE — Telephone Encounter (Signed)
Joyce Gross w/[facility] contacted the office for an order to DC the patients wound vac, and use wet to dry dressings instead. States the wound is 0.5 X 3cm .Please contact Joyce Gross regarding order

## 2011-05-29 ENCOUNTER — Encounter (INDEPENDENT_AMBULATORY_CARE_PROVIDER_SITE_OTHER): Payer: Medicaid Other | Admitting: General Surgery

## 2011-06-03 ENCOUNTER — Telehealth (INDEPENDENT_AMBULATORY_CARE_PROVIDER_SITE_OTHER): Payer: Self-pay

## 2011-06-03 NOTE — Telephone Encounter (Signed)
Spoke with Brooke at [facility] to ok d/c wound vac and start qd damp to dry dressing changes.  OK'd by Dr. Abbey Chatters.

## 2011-06-04 ENCOUNTER — Encounter (INDEPENDENT_AMBULATORY_CARE_PROVIDER_SITE_OTHER): Payer: Medicaid Other | Admitting: General Surgery

## 2011-06-11 ENCOUNTER — Ambulatory Visit (HOSPITAL_BASED_OUTPATIENT_CLINIC_OR_DEPARTMENT_OTHER): Payer: Medicaid Other | Admitting: Oncology

## 2011-06-11 ENCOUNTER — Other Ambulatory Visit: Payer: Medicaid Other | Admitting: Lab

## 2011-06-11 ENCOUNTER — Other Ambulatory Visit (HOSPITAL_BASED_OUTPATIENT_CLINIC_OR_DEPARTMENT_OTHER): Payer: Medicaid Other | Admitting: Lab

## 2011-06-11 DIAGNOSIS — I10 Essential (primary) hypertension: Secondary | ICD-10-CM

## 2011-06-11 DIAGNOSIS — C9000 Multiple myeloma not having achieved remission: Secondary | ICD-10-CM

## 2011-06-11 DIAGNOSIS — Z7901 Long term (current) use of anticoagulants: Secondary | ICD-10-CM

## 2011-06-11 DIAGNOSIS — Z86718 Personal history of other venous thrombosis and embolism: Secondary | ICD-10-CM

## 2011-06-11 LAB — CBC WITH DIFFERENTIAL/PLATELET
BASO%: 0.5 % (ref 0.0–2.0)
EOS%: 0.3 % (ref 0.0–7.0)
HCT: 39 % (ref 34.8–46.6)
LYMPH%: 31.2 % (ref 14.0–49.7)
MCH: 29.4 pg (ref 25.1–34.0)
MCHC: 32.8 g/dL (ref 31.5–36.0)
NEUT%: 60.4 % (ref 38.4–76.8)
RBC: 4.35 10*6/uL (ref 3.70–5.45)
WBC: 2.5 10*3/uL — ABNORMAL LOW (ref 3.9–10.3)
lymph#: 0.8 10*3/uL — ABNORMAL LOW (ref 0.9–3.3)

## 2011-06-11 MED ORDER — SODIUM CHLORIDE 0.9 % IJ SOLN
10.0000 mL | Freq: Once | INTRAMUSCULAR | Status: AC
  Administered 2011-06-11: 10 mL
  Filled 2011-06-11: qty 10

## 2011-06-11 MED ORDER — HEPARIN SOD (PORK) LOCK FLUSH 100 UNIT/ML IV SOLN
500.0000 [IU] | Freq: Once | INTRAVENOUS | Status: AC
  Administered 2011-06-11: 500 [IU] via INTRAVENOUS
  Filled 2011-06-11: qty 5

## 2011-06-11 NOTE — Progress Notes (Signed)
Hematology and Oncology Follow Up Visit  [name] [id number] [birth year] 60 y.o. 06/11/2011 3:22 PM  CC: [facility]   Principle Diagnosis:  This is a 60 year old female with the following issues:   1. 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  She had a free kappa light chain around 1500. 2. Diagnosis of deep vein thrombosis in July 2011.    Prior Therapy:    1. Initially treated with melphalan and prednisone.  She had a partial response with free light chains down as low as 19, M spike down to 0.52 g/dL.  2. Patient treated with Revlimid at 25 mg 3 weeks on and 1 week off between November 2009 until November 2010.  She had a partial response, light chains down to 33 and M spike was not detected.   3. She was treated on maintenance Revlimid 15 mg to maintain her partial response. 4. She was on Zometa intermittently, on hold know. 5. Currently anticoagulated with Coumadin 7.5 mg since July 2011.  Current therapy :She is on Velcade 1.3 mg/sq m for a total of 2.9 mg weekly, which started on 02/21/2011 with weekly dexamethasone .  Treatment has been on hold due to recurrent hospitalization for wound infection. Plan to restart treatment in in 06/2011.  Interim History:  :  [name] presents today for an office followup visit.  This is a pleasant 60 year old female with a few comorbid conditions as outlined above.  She was hospitalized due to an abdominal wound infection, at which time she had presented with nausea, vomiting, abdominal pain, and at that time found to have increased secretions from her abdominal wound.  She was also found to be in renal failure. She was hospitalized between 10/3 till 10/29 .  Her acute renal failure has resolved.   She was discharged with a wound VAC. She has ben doing well since her recent discharge, and wound VAC has been removed. She does not report any  obvious bleeding such as epistaxis, gum bleeding, hemoptysis, melena, hematochezia or hematuria.  She does have some intermittent swelling in her right lower extremity.  She does not report any headaches, visual changes, any dysphagia, odynophagia, any nausea, vomiting, diarrhea, constipation, chest pain, shortness of breath, productive cough, abdominal pain, abdominal swelling, lower extremity paresthesias, bowel or bladder incontinence.  She denies any fevers, chills or night sweats.    Medications: I have reviewed the patient's current medications.  Allergies:  Allergies  Allergen Reactions  . Ibuprofen Other (See Comments)    Patient does not remember.   . Morphine And Related Other (See Comments)    Patient does not remember reaction, believes it caused raised marks on skin.    Past Medical History, Surgical history, Social history, and Family History were reviewed and updated.  Review of Systems: Constitutional:  Negative for fever, chills, night sweats, anorexia, weight loss, pain. Cardiovascular: no chest pain or dyspnea on exertion Respiratory: no cough, shortness of breath, or wheezing Neurological: no TIA or stroke symptoms Dermatological: negative ENT: negative Skin: Negative. Gastrointestinal: no abdominal pain, change in bowel habits, or black or bloody stools Genito-Urinary: no dysuria, trouble voiding, or hematuria Hematological and Lymphatic: negative Breast: negative for breast lumps negative Musculoskeletal: negative Remaining ROS negative. Physical Exam: Blood pressure 103/70, pulse 71, temperature 97.8 F (36.6 C), temperature source Oral, height 5\' 8"  (1.727 m), weight 265 lb (120.203 kg). ECOG: 1 General appearance:  alert Head: Normocephalic, without obvious abnormality, atraumatic Neck: no adenopathy, no carotid bruit, no JVD, supple, symmetrical, trachea midline and thyroid not enlarged, symmetric, no tenderness/mass/nodules Lymph nodes: Cervical,  supraclavicular, and axillary nodes normal. Heart:regular rate and rhythm, S1, S2 normal, no murmur, click, rub or gallop Lung:chest clear, no wheezing, rales, normal symmetric air entry, Heart exam - S1, S2 normal, no murmur, no gallop, rate regular Abdomin: soft, non-tender, without masses or organomegaly. Wound appear have healed. EXT:no erythema, induration, or nodules   Lab Results: Lab Results  Component Value Date   WBC 2.5* 06/11/2011   HGB 12.8 06/11/2011   HCT 39.0 06/11/2011   MCV 89.4 06/11/2011   PLT 125 Oc large platelet* 06/11/2011     Chemistry      Component Value Date/Time   NA 141 05/02/2011 1442   K 4.2 05/02/2011 1442   CL 111 05/02/2011 1442   CO2 21 05/02/2011 1442   BUN 18 05/02/2011 1442   CREATININE 1.25* 05/02/2011 1442      Component Value Date/Time   CALCIUM 8.2* 05/02/2011 1442   ALKPHOS 76 05/02/2011 1442   AST 17 05/02/2011 1442   ALT 11 05/02/2011 1442   BILITOT 0.3 05/02/2011 1442         Impression and Plan:  1. Light chain multiple myeloma.  Plan is to restart Velcade IV therapy on weekly bases since her wound is fully healed. Risks and benefits restated at this time. She is willing to proceed. 2. Anemia, multifactorial.  IResolved. 3. History of deep vein thrombosis.  She is on Coumadin 5 mg.  We will continue to monitor her PT and INR.  4. Abdominal wound infection.  The infection seems to have resolved. 5. Hypertension seems to be under relatively good control.      [facility], MD 12/18/20123:22 PM

## 2011-06-12 ENCOUNTER — Encounter (INDEPENDENT_AMBULATORY_CARE_PROVIDER_SITE_OTHER): Payer: Medicaid Other | Admitting: General Surgery

## 2011-06-12 ENCOUNTER — Encounter: Payer: Self-pay | Admitting: *Deleted

## 2011-06-13 ENCOUNTER — Telehealth: Payer: Self-pay | Admitting: Oncology

## 2011-06-13 LAB — COMPREHENSIVE METABOLIC PANEL
ALT: 22 U/L (ref 0–35)
AST: 26 U/L (ref 0–37)
Chloride: 106 mEq/L (ref 96–112)
Creatinine, Ser: 1.26 mg/dL — ABNORMAL HIGH (ref 0.50–1.10)
Sodium: 142 mEq/L (ref 135–145)
Total Bilirubin: 0.4 mg/dL (ref 0.3–1.2)
Total Protein: 7.2 g/dL (ref 6.0–8.3)

## 2011-06-13 LAB — IMMUNOFIXATION ELECTROPHORESIS
IgA: 33 mg/dL — ABNORMAL LOW (ref 69–380)
IgG (Immunoglobin G), Serum: 1290 mg/dL (ref 690–1700)
Total Protein, Serum Electrophoresis: 7.2 g/dL (ref 6.0–8.3)

## 2011-06-13 NOTE — Telephone Encounter (Signed)
S/w pt today re next appt for 1/3 @ 2:30 pm - pt will get new schedule when she comes in.

## 2011-06-27 ENCOUNTER — Other Ambulatory Visit: Payer: Self-pay | Admitting: Oncology

## 2011-06-27 ENCOUNTER — Other Ambulatory Visit: Payer: Medicaid Other | Admitting: Lab

## 2011-06-27 ENCOUNTER — Ambulatory Visit (HOSPITAL_BASED_OUTPATIENT_CLINIC_OR_DEPARTMENT_OTHER): Payer: Medicaid Other

## 2011-06-27 DIAGNOSIS — C9000 Multiple myeloma not having achieved remission: Secondary | ICD-10-CM

## 2011-06-27 DIAGNOSIS — Z5112 Encounter for antineoplastic immunotherapy: Secondary | ICD-10-CM

## 2011-06-27 LAB — COMPREHENSIVE METABOLIC PANEL
Albumin: 3.6 g/dL (ref 3.5–5.2)
CO2: 26 mEq/L (ref 19–32)
Calcium: 9.1 mg/dL (ref 8.4–10.5)
Glucose, Bld: 86 mg/dL (ref 70–99)
Potassium: 4.2 mEq/L (ref 3.5–5.3)
Sodium: 142 mEq/L (ref 135–145)
Total Protein: 7.2 g/dL (ref 6.0–8.3)

## 2011-06-27 LAB — CBC WITH DIFFERENTIAL/PLATELET
Basophils Absolute: 0 10*3/uL (ref 0.0–0.1)
Eosinophils Absolute: 0 10*3/uL (ref 0.0–0.5)
HGB: 12.1 g/dL (ref 11.6–15.9)
MCV: 87.9 fL (ref 79.5–101.0)
MONO%: 10.1 % (ref 0.0–14.0)
NEUT#: 1.5 10*3/uL (ref 1.5–6.5)
RDW: 17 % — ABNORMAL HIGH (ref 11.2–14.5)

## 2011-06-27 LAB — TECHNOLOGIST REVIEW

## 2011-06-27 MED ORDER — SODIUM CHLORIDE 0.9 % IV SOLN: 8.0000 mg | Freq: Once | INTRAVENOUS | Status: DC

## 2011-06-27 MED ORDER — HEPARIN SOD (PORK) LOCK FLUSH 100 UNIT/ML IV SOLN
500.0000 [IU] | Freq: Once | INTRAVENOUS | Status: AC | PRN
  Administered 2011-06-27: 500 [IU]
  Filled 2011-06-27: qty 5

## 2011-06-27 MED ORDER — SODIUM CHLORIDE 0.9 % IV SOLN
Freq: Once | INTRAVENOUS | Status: AC
  Administered 2011-06-27: 16:00:00 via INTRAVENOUS

## 2011-06-27 MED ORDER — SODIUM CHLORIDE 0.9 % IJ SOLN
10.0000 mL | INTRAMUSCULAR | Status: DC | PRN
  Administered 2011-06-27: 10 mL
  Filled 2011-06-27: qty 10

## 2011-06-27 MED ORDER — ONDANSETRON 8 MG/50ML IVPB (CHCC)
8.0000 mg | Freq: Once | INTRAVENOUS | Status: AC
  Administered 2011-06-27: 8 mg via INTRAVENOUS

## 2011-06-27 MED ORDER — BORTEZOMIB CHEMO IV INJECTION 3.5 MG
1.3000 mg/m2 | Freq: Once | INTRAMUSCULAR | Status: AC
  Administered 2011-06-27: 3.1 mg via INTRAVENOUS
  Filled 2011-06-27: qty 3.1

## 2011-06-27 NOTE — Progress Notes (Signed)
Pt states she has trouble sleeping despite taking restoril 7.5 mg.  She states the restoril takes a while to work.  Pt states oxycodone does not make her sleepy.  Pt wants to know could restoril be refilled at maybe a higher dose.  Informed Natalie, desk RN, who will check with MD.

## 2011-07-02 ENCOUNTER — Telehealth: Payer: Self-pay | Admitting: *Deleted

## 2011-07-02 NOTE — Telephone Encounter (Signed)
Received call from pt stating that she has pain in her side and back when she coughs and wants to know if she can have something called in for pain. Notified MD who states that since no fever or productive sputum is present pt should try OTC pain meds and cough syrup. Returned call to pt at home to give above information. Verbalized understanding. Pt encouraged to call with any other questions/cocnerns

## 2011-07-03 ENCOUNTER — Telehealth: Payer: Self-pay | Admitting: *Deleted

## 2011-07-03 MED ORDER — BENZONATATE 100 MG PO CAPS: 100.0000 mg | ORAL_CAPSULE | Freq: Three times a day (TID) | ORAL | Status: AC | PRN

## 2011-07-03 MED ORDER — AMOXICILLIN-POT CLAVULANATE 875-125 MG PO TABS: 1.0000 | ORAL_TABLET | Freq: Two times a day (BID) | ORAL | Status: AC

## 2011-07-03 NOTE — Telephone Encounter (Signed)
Received call from pt stating that she is now very stuffed up and coughing up yellow phlegm and would like for Dr. Clelia Croft to call in an antibiotic and cough syrup for her. Per the pt she currently does not have a fever. Notified MD who states pt can have tessalon perles and augmentin. Will call in to pts pharm of choice. Pt aware

## 2011-07-04 ENCOUNTER — Encounter: Payer: Self-pay | Admitting: *Deleted

## 2011-07-04 ENCOUNTER — Ambulatory Visit: Payer: Medicaid Other

## 2011-07-04 ENCOUNTER — Other Ambulatory Visit: Payer: Medicaid Other | Admitting: Lab

## 2011-07-04 NOTE — Progress Notes (Signed)
Treatment cancelled today due to pt being sick per MD. Will notify lab and infusion. Pt aware

## 2011-07-11 ENCOUNTER — Ambulatory Visit (HOSPITAL_BASED_OUTPATIENT_CLINIC_OR_DEPARTMENT_OTHER): Payer: Medicaid Other

## 2011-07-11 ENCOUNTER — Other Ambulatory Visit: Payer: Medicaid Other | Admitting: Lab

## 2011-07-11 ENCOUNTER — Other Ambulatory Visit: Payer: Self-pay | Admitting: Oncology

## 2011-07-11 DIAGNOSIS — Z5112 Encounter for antineoplastic immunotherapy: Secondary | ICD-10-CM

## 2011-07-11 DIAGNOSIS — C9000 Multiple myeloma not having achieved remission: Secondary | ICD-10-CM

## 2011-07-11 LAB — CBC WITH DIFFERENTIAL/PLATELET
Basophils Absolute: 0 10*3/uL (ref 0.0–0.1)
Eosinophils Absolute: 0 10*3/uL (ref 0.0–0.5)
HCT: 40.1 % (ref 34.8–46.6)
LYMPH%: 39.8 % (ref 14.0–49.7)
MONO#: 0.4 10*3/uL (ref 0.1–0.9)
NEUT%: 46.5 % (ref 38.4–76.8)
Platelets: 104 10*3/uL — ABNORMAL LOW (ref 145–400)

## 2011-07-11 LAB — TECHNOLOGIST REVIEW

## 2011-07-11 MED ORDER — BORTEZOMIB CHEMO IV INJECTION 3.5 MG
1.3000 mg/m2 | Freq: Once | INTRAMUSCULAR | Status: AC
  Administered 2011-07-11: 3.1 mg via INTRAVENOUS
  Filled 2011-07-11: qty 3.1

## 2011-07-11 MED ORDER — SODIUM CHLORIDE 0.9 % IJ SOLN
10.0000 mL | INTRAMUSCULAR | Status: DC | PRN
  Administered 2011-07-11: 10 mL
  Filled 2011-07-11: qty 10

## 2011-07-11 MED ORDER — SODIUM CHLORIDE 0.9 % IV SOLN
Freq: Once | INTRAVENOUS | Status: AC
  Administered 2011-07-11: 15:00:00 via INTRAVENOUS

## 2011-07-11 MED ORDER — ONDANSETRON 8 MG/50ML IVPB (CHCC)
8.0000 mg | Freq: Once | INTRAVENOUS | Status: AC
  Administered 2011-07-11: 8 mg via INTRAVENOUS

## 2011-07-11 MED ORDER — HEPARIN SOD (PORK) LOCK FLUSH 100 UNIT/ML IV SOLN
500.0000 [IU] | Freq: Once | INTRAVENOUS | Status: AC | PRN
  Administered 2011-07-11: 500 [IU]
  Filled 2011-07-11: qty 5

## 2011-07-16 LAB — COMPREHENSIVE METABOLIC PANEL
ALT: 16 U/L (ref 0–35)
BUN: 20 mg/dL (ref 6–23)
CO2: 24 mEq/L (ref 19–32)
Calcium: 8.9 mg/dL (ref 8.4–10.5)
Chloride: 109 mEq/L (ref 96–112)
Creatinine, Ser: 1.34 mg/dL — ABNORMAL HIGH (ref 0.50–1.10)
Glucose, Bld: 82 mg/dL (ref 70–99)

## 2011-07-16 LAB — SPEP & IFE WITH QIG
Alpha-2-Globulin: 12.4 % — ABNORMAL HIGH (ref 7.1–11.8)
Gamma Globulin: 19.1 % — ABNORMAL HIGH (ref 11.1–18.8)
IgG (Immunoglobin G), Serum: 1390 mg/dL (ref 690–1700)
IgM, Serum: 10 mg/dL — ABNORMAL LOW (ref 52–322)
M-Spike, %: 1.1 g/dL

## 2011-07-16 LAB — KAPPA/LAMBDA LIGHT CHAINS: Kappa free light chain: 653 mg/dL — ABNORMAL HIGH (ref 0.33–1.94)

## 2011-07-17 ENCOUNTER — Ambulatory Visit: Payer: Medicaid Other | Admitting: Oncology

## 2011-07-17 ENCOUNTER — Other Ambulatory Visit: Payer: Medicaid Other | Admitting: Lab

## 2011-07-18 ENCOUNTER — Ambulatory Visit (HOSPITAL_BASED_OUTPATIENT_CLINIC_OR_DEPARTMENT_OTHER): Payer: Medicaid Other

## 2011-07-18 ENCOUNTER — Ambulatory Visit: Payer: Medicaid Other | Admitting: Lab

## 2011-07-18 DIAGNOSIS — Z5111 Encounter for antineoplastic chemotherapy: Secondary | ICD-10-CM

## 2011-07-18 DIAGNOSIS — C9000 Multiple myeloma not having achieved remission: Secondary | ICD-10-CM

## 2011-07-18 LAB — CBC WITH DIFFERENTIAL/PLATELET
Basophils Absolute: 0 10*3/uL (ref 0.0–0.1)
Eosinophils Absolute: 0 10*3/uL (ref 0.0–0.5)
HCT: 37.8 % (ref 34.8–46.6)
HGB: 12.4 g/dL (ref 11.6–15.9)
MONO#: 0.3 10*3/uL (ref 0.1–0.9)
NEUT%: 43.1 % (ref 38.4–76.8)
WBC: 2.9 10*3/uL — ABNORMAL LOW (ref 3.9–10.3)
lymph#: 1.3 10*3/uL (ref 0.9–3.3)

## 2011-07-18 LAB — COMPREHENSIVE METABOLIC PANEL
AST: 22 U/L (ref 0–37)
Albumin: 3.9 g/dL (ref 3.5–5.2)
BUN: 22 mg/dL (ref 6–23)
Calcium: 9 mg/dL (ref 8.4–10.5)
Chloride: 109 mEq/L (ref 96–112)
Glucose, Bld: 79 mg/dL (ref 70–99)
Potassium: 4 mEq/L (ref 3.5–5.3)

## 2011-07-18 MED ORDER — SODIUM CHLORIDE 0.9 % IV SOLN
Freq: Once | INTRAVENOUS | Status: AC
  Administered 2011-07-18: 15:00:00 via INTRAVENOUS

## 2011-07-18 MED ORDER — SODIUM CHLORIDE 0.9 % IJ SOLN
10.0000 mL | INTRAMUSCULAR | Status: DC | PRN
  Administered 2011-07-18: 10 mL
  Filled 2011-07-18: qty 10

## 2011-07-18 MED ORDER — HEPARIN SOD (PORK) LOCK FLUSH 100 UNIT/ML IV SOLN
500.0000 [IU] | Freq: Once | INTRAVENOUS | Status: AC | PRN
  Administered 2011-07-18: 500 [IU]
  Filled 2011-07-18: qty 5

## 2011-07-18 MED ORDER — BORTEZOMIB CHEMO IV INJECTION 3.5 MG
1.3000 mg/m2 | Freq: Once | INTRAMUSCULAR | Status: AC
  Administered 2011-07-18: 3.1 mg via INTRAVENOUS
  Filled 2011-07-18: qty 3.1

## 2011-07-18 MED ORDER — ONDANSETRON 8 MG/50ML IVPB (CHCC)
8.0000 mg | Freq: Once | INTRAVENOUS | Status: AC
  Administered 2011-07-18: 8 mg via INTRAVENOUS

## 2011-07-19 ENCOUNTER — Other Ambulatory Visit: Payer: Self-pay | Admitting: *Deleted

## 2011-07-19 DIAGNOSIS — C9000 Multiple myeloma not having achieved remission: Secondary | ICD-10-CM

## 2011-07-19 MED ORDER — FLUCONAZOLE 100 MG PO TABS: 100.0000 mg | ORAL_TABLET | Freq: Every day | ORAL | Status: DC

## 2011-07-25 ENCOUNTER — Other Ambulatory Visit: Payer: Self-pay | Admitting: Oncology

## 2011-07-25 DIAGNOSIS — C9000 Multiple myeloma not having achieved remission: Secondary | ICD-10-CM

## 2011-07-26 ENCOUNTER — Telehealth: Payer: Self-pay | Admitting: Oncology

## 2011-07-26 NOTE — Telephone Encounter (Signed)
Added wkly lb/tx appts for 1/ pof. S/w pt today re next appt for 2/6 and pt to get new schedule when she comes in. appt for 2/6 was r/s from 2/7 per pt due to she needs to come in the PM but has a conflicting [facility].

## 2011-07-31 ENCOUNTER — Other Ambulatory Visit: Payer: Medicaid Other | Admitting: Lab

## 2011-07-31 ENCOUNTER — Ambulatory Visit (HOSPITAL_BASED_OUTPATIENT_CLINIC_OR_DEPARTMENT_OTHER): Payer: Medicaid Other

## 2011-07-31 DIAGNOSIS — C9000 Multiple myeloma not having achieved remission: Secondary | ICD-10-CM

## 2011-07-31 DIAGNOSIS — Z5112 Encounter for antineoplastic immunotherapy: Secondary | ICD-10-CM

## 2011-07-31 LAB — CBC WITH DIFFERENTIAL/PLATELET
Basophils Absolute: 0 10*3/uL (ref 0.0–0.1)
EOS%: 0 % (ref 0.0–7.0)
HCT: 39.5 % (ref 34.8–46.6)
HGB: 13.1 g/dL (ref 11.6–15.9)
MONO#: 0.3 10*3/uL (ref 0.1–0.9)
NEUT#: 0.8 10*3/uL — ABNORMAL LOW (ref 1.5–6.5)
NEUT%: 45.2 % (ref 38.4–76.8)
RDW: 17.4 % — ABNORMAL HIGH (ref 11.2–14.5)
WBC: 1.7 10*3/uL — ABNORMAL LOW (ref 3.9–10.3)
lymph#: 0.7 10*3/uL — ABNORMAL LOW (ref 0.9–3.3)

## 2011-07-31 LAB — COMPREHENSIVE METABOLIC PANEL
ALT: 16 U/L (ref 0–35)
Alkaline Phosphatase: 75 U/L (ref 39–117)
CO2: 22 mEq/L (ref 19–32)
Creatinine, Ser: 1.2 mg/dL — ABNORMAL HIGH (ref 0.50–1.10)
Sodium: 140 mEq/L (ref 135–145)
Total Bilirubin: 0.3 mg/dL (ref 0.3–1.2)

## 2011-07-31 LAB — TECHNOLOGIST REVIEW

## 2011-07-31 MED ORDER — ONDANSETRON HCL 8 MG PO TABS
8.0000 mg | ORAL_TABLET | Freq: Once | ORAL | Status: AC
  Administered 2011-07-31: 8 mg via ORAL

## 2011-07-31 MED ORDER — BORTEZOMIB CHEMO SQ INJECTION 3.5 MG (2.5MG/ML)
1.3000 mg/m2 | Freq: Once | INTRAMUSCULAR | Status: AC
  Administered 2011-07-31: 3 mg via SUBCUTANEOUS
  Filled 2011-07-31: qty 3

## 2011-08-01 ENCOUNTER — Ambulatory Visit: Payer: Medicaid Other

## 2011-08-01 ENCOUNTER — Other Ambulatory Visit: Payer: Medicaid Other | Admitting: Lab

## 2011-08-08 ENCOUNTER — Ambulatory Visit (HOSPITAL_BASED_OUTPATIENT_CLINIC_OR_DEPARTMENT_OTHER): Payer: Medicaid Other

## 2011-08-08 ENCOUNTER — Encounter: Payer: Self-pay | Admitting: *Deleted

## 2011-08-08 ENCOUNTER — Other Ambulatory Visit: Payer: Medicaid Other | Admitting: Lab

## 2011-08-08 DIAGNOSIS — Z5112 Encounter for antineoplastic immunotherapy: Secondary | ICD-10-CM

## 2011-08-08 DIAGNOSIS — C9000 Multiple myeloma not having achieved remission: Secondary | ICD-10-CM

## 2011-08-08 LAB — CBC WITH DIFFERENTIAL/PLATELET
Basophils Absolute: 0 10*3/uL (ref 0.0–0.1)
EOS%: 0 % (ref 0.0–7.0)
HCT: 35.7 % (ref 34.8–46.6)
HGB: 11.7 g/dL (ref 11.6–15.9)
LYMPH%: 31.9 % (ref 14.0–49.7)
MCH: 27.4 pg (ref 25.1–34.0)
MONO#: 0.4 10*3/uL (ref 0.1–0.9)
NEUT%: 53.5 % (ref 38.4–76.8)
Platelets: 104 10*3/uL — ABNORMAL LOW (ref 145–400)
lymph#: 0.8 10*3/uL — ABNORMAL LOW (ref 0.9–3.3)

## 2011-08-08 LAB — COMPREHENSIVE METABOLIC PANEL
Albumin: 4 g/dL (ref 3.5–5.2)
BUN: 22 mg/dL (ref 6–23)
Calcium: 8.9 mg/dL (ref 8.4–10.5)
Chloride: 110 mEq/L (ref 96–112)
Creatinine, Ser: 1.47 mg/dL — ABNORMAL HIGH (ref 0.50–1.10)
Glucose, Bld: 75 mg/dL (ref 70–99)
Potassium: 4.3 mEq/L (ref 3.5–5.3)

## 2011-08-08 MED ORDER — BORTEZOMIB CHEMO SQ INJECTION 3.5 MG (2.5MG/ML)
1.3000 mg/m2 | Freq: Once | INTRAMUSCULAR | Status: AC
  Administered 2011-08-08: 3 mg via SUBCUTANEOUS
  Filled 2011-08-08: qty 3

## 2011-08-08 MED ORDER — ONDANSETRON HCL 8 MG PO TABS
8.0000 mg | ORAL_TABLET | Freq: Once | ORAL | Status: AC
  Administered 2011-08-08: 8 mg via ORAL

## 2011-08-08 NOTE — Progress Notes (Signed)
Pt  Entered  Clinic  Today for  Velcade injection.  Labs noted to be    Neut  Of 1.4. Labs   Discussed with Dr. Gaylyn Rong.  Verbal  Consent  Given by Dr. Gaylyn Rong to  Treat pt today with  Decreased labs

## 2011-08-12 ENCOUNTER — Other Ambulatory Visit: Payer: Self-pay | Admitting: *Deleted

## 2011-08-12 DIAGNOSIS — R05 Cough: Secondary | ICD-10-CM

## 2011-08-12 MED ORDER — AMOXICILLIN-POT CLAVULANATE 875-125 MG PO TABS: 1.0000 | ORAL_TABLET | Freq: Two times a day (BID) | ORAL | Status: AC

## 2011-08-12 NOTE — Telephone Encounter (Signed)
[phone] received call from pt stating that she is coughing up yellow sputum again and needs antibiotic. No fever at this time. Notified [place] [facility], [place] ok to refill at this time. Pt aware

## 2011-08-13 ENCOUNTER — Other Ambulatory Visit: Payer: Self-pay | Admitting: Certified Registered Nurse Anesthetist

## 2011-08-15 ENCOUNTER — Ambulatory Visit (HOSPITAL_BASED_OUTPATIENT_CLINIC_OR_DEPARTMENT_OTHER): Payer: Medicaid Other

## 2011-08-15 ENCOUNTER — Other Ambulatory Visit (HOSPITAL_BASED_OUTPATIENT_CLINIC_OR_DEPARTMENT_OTHER): Payer: Medicaid Other | Admitting: Lab

## 2011-08-15 DIAGNOSIS — C9000 Multiple myeloma not having achieved remission: Secondary | ICD-10-CM

## 2011-08-15 DIAGNOSIS — Z5112 Encounter for antineoplastic immunotherapy: Secondary | ICD-10-CM

## 2011-08-15 LAB — COMPREHENSIVE METABOLIC PANEL
CO2: 21 mEq/L (ref 19–32)
Creatinine, Ser: 2.19 mg/dL — ABNORMAL HIGH (ref 0.50–1.10)
Glucose, Bld: 76 mg/dL (ref 70–99)
Total Bilirubin: 0.4 mg/dL (ref 0.3–1.2)

## 2011-08-15 LAB — CBC WITH DIFFERENTIAL/PLATELET
Basophils Absolute: 0 10*3/uL (ref 0.0–0.1)
Eosinophils Absolute: 0 10*3/uL (ref 0.0–0.5)
LYMPH%: 31.7 % (ref 14.0–49.7)
MCV: 82.4 fL (ref 79.5–101.0)
MONO%: 16.7 % — ABNORMAL HIGH (ref 0.0–14.0)
NEUT#: 1.2 10*3/uL — ABNORMAL LOW (ref 1.5–6.5)
Platelets: 104 10*3/uL — ABNORMAL LOW (ref 145–400)
RBC: 4.14 10*6/uL (ref 3.70–5.45)

## 2011-08-15 LAB — TECHNOLOGIST REVIEW

## 2011-08-15 MED ORDER — ONDANSETRON HCL 8 MG PO TABS
8.0000 mg | ORAL_TABLET | Freq: Once | ORAL | Status: AC
  Administered 2011-08-15: 8 mg via ORAL

## 2011-08-15 MED ORDER — BORTEZOMIB CHEMO SQ INJECTION 3.5 MG (2.5MG/ML)
1.3000 mg/m2 | Freq: Once | INTRAMUSCULAR | Status: AC
  Administered 2011-08-15: 3 mg via SUBCUTANEOUS
  Filled 2011-08-15: qty 3

## 2011-08-15 NOTE — Progress Notes (Signed)
Okay to treat per Dr. Clelia Croft 08/15/11

## 2011-08-22 ENCOUNTER — Ambulatory Visit (HOSPITAL_BASED_OUTPATIENT_CLINIC_OR_DEPARTMENT_OTHER): Payer: Medicaid Other

## 2011-08-22 ENCOUNTER — Other Ambulatory Visit: Payer: Medicaid Other | Admitting: Lab

## 2011-08-22 DIAGNOSIS — C9000 Multiple myeloma not having achieved remission: Secondary | ICD-10-CM

## 2011-08-22 DIAGNOSIS — Z5112 Encounter for antineoplastic immunotherapy: Secondary | ICD-10-CM

## 2011-08-22 LAB — CBC WITH DIFFERENTIAL/PLATELET
BASO%: 0.4 % (ref 0.0–2.0)
Basophils Absolute: 0 10*3/uL (ref 0.0–0.1)
HCT: 33.9 % — ABNORMAL LOW (ref 34.8–46.6)
HGB: 11 g/dL — ABNORMAL LOW (ref 11.6–15.9)
MONO#: 0.5 10*3/uL (ref 0.1–0.9)
NEUT%: 55.9 % (ref 38.4–76.8)
WBC: 3 10*3/uL — ABNORMAL LOW (ref 3.9–10.3)
lymph#: 0.8 10*3/uL — ABNORMAL LOW (ref 0.9–3.3)

## 2011-08-22 LAB — COMPREHENSIVE METABOLIC PANEL
ALT: 13 U/L (ref 0–35)
Albumin: 3.4 g/dL — ABNORMAL LOW (ref 3.5–5.2)
BUN: 28 mg/dL — ABNORMAL HIGH (ref 6–23)
CO2: 21 mEq/L (ref 19–32)
Calcium: 9 mg/dL (ref 8.4–10.5)
Chloride: 110 mEq/L (ref 96–112)
Creatinine, Ser: 1.37 mg/dL — ABNORMAL HIGH (ref 0.50–1.10)

## 2011-08-22 MED ORDER — BORTEZOMIB CHEMO SQ INJECTION 3.5 MG (2.5MG/ML)
1.3000 mg/m2 | Freq: Once | INTRAMUSCULAR | Status: AC
  Administered 2011-08-22: 3 mg via SUBCUTANEOUS
  Filled 2011-08-22: qty 3

## 2011-08-22 MED ORDER — ONDANSETRON HCL 8 MG PO TABS
8.0000 mg | ORAL_TABLET | Freq: Once | ORAL | Status: AC
  Administered 2011-08-22: 8 mg via ORAL

## 2011-08-22 NOTE — Patient Instructions (Signed)
Pt d/c'd via walker.  No complaints.  SLJ

## 2011-08-29 ENCOUNTER — Ambulatory Visit (HOSPITAL_BASED_OUTPATIENT_CLINIC_OR_DEPARTMENT_OTHER): Payer: Medicaid Other

## 2011-08-29 ENCOUNTER — Other Ambulatory Visit (HOSPITAL_BASED_OUTPATIENT_CLINIC_OR_DEPARTMENT_OTHER): Payer: Medicaid Other | Admitting: Lab

## 2011-08-29 DIAGNOSIS — C9000 Multiple myeloma not having achieved remission: Secondary | ICD-10-CM

## 2011-08-29 DIAGNOSIS — Z5112 Encounter for antineoplastic immunotherapy: Secondary | ICD-10-CM

## 2011-08-29 LAB — CBC WITH DIFFERENTIAL/PLATELET
EOS%: 0 % (ref 0.0–7.0)
Eosinophils Absolute: 0 10*3/uL (ref 0.0–0.5)
MCH: 27.4 pg (ref 25.1–34.0)
MCV: 81.3 fL (ref 79.5–101.0)
MONO%: 10.9 % (ref 0.0–14.0)
NEUT#: 1 10*3/uL — ABNORMAL LOW (ref 1.5–6.5)
RBC: 3.68 10*6/uL — ABNORMAL LOW (ref 3.70–5.45)
RDW: 18.4 % — ABNORMAL HIGH (ref 11.2–14.5)
lymph#: 0.9 10*3/uL (ref 0.9–3.3)
nRBC: 0 % (ref 0–0)

## 2011-08-29 LAB — COMPREHENSIVE METABOLIC PANEL
ALT: 12 U/L (ref 0–35)
AST: 21 U/L (ref 0–37)
Alkaline Phosphatase: 63 U/L (ref 39–117)
Sodium: 143 mEq/L (ref 135–145)
Total Bilirubin: 0.5 mg/dL (ref 0.3–1.2)
Total Protein: 7 g/dL (ref 6.0–8.3)

## 2011-08-29 MED ORDER — ONDANSETRON HCL 8 MG PO TABS
8.0000 mg | ORAL_TABLET | Freq: Once | ORAL | Status: AC
  Administered 2011-08-29: 8 mg via ORAL

## 2011-08-29 MED ORDER — BORTEZOMIB CHEMO SQ INJECTION 3.5 MG (2.5MG/ML)
1.3000 mg/m2 | Freq: Once | INTRAMUSCULAR | Status: AC
  Administered 2011-08-29: 3 mg via SUBCUTANEOUS
  Filled 2011-08-29: qty 3

## 2011-08-29 NOTE — Progress Notes (Signed)
Per Dr Jannette Fogo to treat pt today with velcade and ANC of 1.0

## 2011-08-29 NOTE — Patient Instructions (Signed)
[facility] Discharge Instructions for Patients Receiving Chemotherapy  Today you received the following chemotherapy agentsVELCADE To help prevent nausea and vomiting after your treatment, we encourage you to take your nausea medication  s.   If you develop nausea and vomiting that is not controlled by your nausea medication, call the clinic. If it is after clinic hours your family physician or the after hours number for the clinic or go to the Emergency Department.   BELOW ARE SYMPTOMS THAT SHOULD BE REPORTED IMMEDIATELY:  *FEVER GREATER THAN 100.5 F  *CHILLS WITH OR WITHOUT FEVER  NAUSEA AND VOMITING THAT IS NOT CONTROLLED WITH YOUR NAUSEA MEDICATION  *UNUSUAL SHORTNESS OF BREATH  *UNUSUAL BRUISING OR BLEEDING  TENDERNESS IN MOUTH AND THROAT WITH OR WITHOUT PRESENCE OF ULCERS  *URINARY PROBLEMS  *BOWEL PROBLEMS  UNUSUAL RASH Items with * indicate a potential emergency and should be followed up as soon as possible.  One of the nurses will contact you 24 hours after your treatment. Please let the nurse know about any problems that you may have experienced. Feel free to call the clinic you have any questions or concerns. The clinic phone number is [phone].   I have been informed and understand all the instructions given to me. I know to contact the clinic, my physician, or go to the Emergency Department if any problems should occur. I do not have any questions at this time, but understand that I may call the clinic during office hours   should I have any questions or need assistance in obtaining follow up care.    __________________________________________  _____________  __________ Signature of Patient or Authorized Representative            Date                   Time    __________________________________________ Nurse's Signature

## 2011-09-05 ENCOUNTER — Ambulatory Visit: Payer: Medicaid Other

## 2011-09-05 ENCOUNTER — Ambulatory Visit (HOSPITAL_BASED_OUTPATIENT_CLINIC_OR_DEPARTMENT_OTHER): Payer: Medicaid Other | Admitting: Oncology

## 2011-09-05 ENCOUNTER — Other Ambulatory Visit (HOSPITAL_BASED_OUTPATIENT_CLINIC_OR_DEPARTMENT_OTHER): Payer: Medicaid Other | Admitting: Lab

## 2011-09-05 DIAGNOSIS — C9000 Multiple myeloma not having achieved remission: Secondary | ICD-10-CM

## 2011-09-05 DIAGNOSIS — K1370 Unspecified lesions of oral mucosa: Secondary | ICD-10-CM

## 2011-09-05 DIAGNOSIS — Z86718 Personal history of other venous thrombosis and embolism: Secondary | ICD-10-CM

## 2011-09-05 LAB — CBC WITH DIFFERENTIAL/PLATELET
Basophils Absolute: 0 10*3/uL (ref 0.0–0.1)
EOS%: 0 % (ref 0.0–7.0)
HCT: 29.9 % — ABNORMAL LOW (ref 34.8–46.6)
HGB: 10 g/dL — ABNORMAL LOW (ref 11.6–15.9)
LYMPH%: 45.5 % (ref 14.0–49.7)
MCH: 27.4 pg (ref 25.1–34.0)
MCV: 81.9 fL (ref 79.5–101.0)
NEUT%: 39.3 % (ref 38.4–76.8)
Platelets: 95 10*3/uL — ABNORMAL LOW (ref 145–400)
lymph#: 0.8 10*3/uL — ABNORMAL LOW (ref 0.9–3.3)

## 2011-09-05 LAB — COMPREHENSIVE METABOLIC PANEL
Albumin: 3.5 g/dL (ref 3.5–5.2)
Alkaline Phosphatase: 70 U/L (ref 39–117)
BUN: 26 mg/dL — ABNORMAL HIGH (ref 6–23)
Calcium: 8.6 mg/dL (ref 8.4–10.5)
Creatinine, Ser: 1.32 mg/dL — ABNORMAL HIGH (ref 0.50–1.10)
Glucose, Bld: 83 mg/dL (ref 70–99)
Potassium: 4 mEq/L (ref 3.5–5.3)

## 2011-09-05 MED ORDER — MAGIC MOUTHWASH: 5.0000 mL | ORAL | Status: DC | PRN

## 2011-09-05 NOTE — Progress Notes (Signed)
Hematology and Oncology Follow Up Visit  [name] [id number] [birth year] 61 y.o. 09/05/2011 3:43 PM  CC: [facility]   Principle Diagnosis:  This is a 61 year old female with the following issues:   1. 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  She had a free kappa light chain around 1500. 2. Diagnosis of deep vein thrombosis in July 2011.    Prior Therapy:    1. Initially treated with melphalan and prednisone.  She had a partial response with free light chains down as low as 19, M spike down to 0.52 g/dL.  2. Patient treated with Revlimid at 25 mg 3 weeks on and 1 week off between November 2009 until November 2010.  She had a partial response, light chains down to 33 and M spike was not detected.   3. She was treated on maintenance Revlimid 15 mg to maintain her partial response. 4. She was on Zometa intermittently, on hold know. 5. Currently anticoagulated with Coumadin 7.5 mg since July 2011.  Current therapy :She is on Velcade 1.3 mg/sq m for a total of 2.9 mg weekly, which started on 02/21/2011 with weekly dexamethasone .  Treatment has been on hold due to recurrent hospitalization for wound infection. Treatment restarted in in 06/2011.  Interim History:  [name] presents today for an office followup visit.  This is a pleasant 61 year old female with a few comorbid conditions as outlined above. She tolerated Velcade well without complications. She is reporting some mouth pain at this time. She has not has any wound infections since VAC was removed.  She does not report any obvious bleeding such as epistaxis, gum bleeding, hemoptysis, melena, hematochezia or hematuria.  She does have some intermittent swelling in her right lower extremity.  She does not report any headaches, visual changes, any dysphagia, odynophagia, any nausea, vomiting, diarrhea, constipation, chest pain,  shortness of breath, productive cough, abdominal pain, abdominal swelling, lower extremity paresthesias, bowel or bladder incontinence.  She denies any fevers, chills or night sweats.    Medications: I have reviewed the patient's current medications.  Allergies:  Allergies  Allergen Reactions  . Ibuprofen Other (See Comments)    Patient does not remember.   . Morphine And Related Other (See Comments)    Patient does not remember reaction, believes it caused raised marks on skin.    Past Medical History, Surgical history, Social history, and Family History were reviewed and updated.  Review of Systems: Constitutional:  Negative for fever, chills, night sweats, anorexia, weight loss, pain. Cardiovascular: no chest pain or dyspnea on exertion Respiratory: no cough, shortness of breath, or wheezing Neurological: no TIA or stroke symptoms Dermatological: negative ENT: negative Skin: Negative. Gastrointestinal: no abdominal pain, change in bowel habits, or black or bloody stools Genito-Urinary: no dysuria, trouble voiding, or hematuria Hematological and Lymphatic: negative Breast: negative for breast lumps negative Musculoskeletal: negative Remaining ROS negative. Physical Exam: Blood pressure 99/62, pulse 83, temperature 96.8 F (36 C), temperature source Oral, height 5\' 8"  (1.727 m), weight 262 lb (118.842 kg). ECOG: 1 General appearance: alert Head: Normocephalic, without obvious abnormality, atraumatic Neck: no adenopathy, no carotid bruit, no JVD, supple, symmetrical, trachea midline and thyroid not enlarged, symmetric, no tenderness/mass/nodules Lymph nodes: Cervical, supraclavicular, and axillary nodes normal. Heart:regular rate and rhythm, S1, S2 normal, no murmur, click, rub or gallop Lung:chest clear, no wheezing, rales, normal symmetric air entry, Heart exam - S1,  S2 normal, no murmur, no gallop, rate regular Abdomin: soft, non-tender, without masses or organomegaly.  Wound appear have healed. EXT:no erythema, induration, or nodules   Lab Results: Lab Results  Component Value Date   WBC 1.7* 09/05/2011   HGB 10.0* 09/05/2011   HCT 29.9* 09/05/2011   MCV 81.9 09/05/2011   PLT 95* 09/05/2011     Chemistry      Component Value Date/Time   NA 143 08/29/2011 1418   K 4.0 08/29/2011 1418   CL 114* 08/29/2011 1418   CO2 20 08/29/2011 1418   BUN 22 08/29/2011 1418   CREATININE 1.21* 08/29/2011 1418      Component Value Date/Time   CALCIUM 8.7 08/29/2011 1418   ALKPHOS 63 08/29/2011 1418   AST 21 08/29/2011 1418   ALT 12 08/29/2011 1418   BILITOT 0.5 08/29/2011 1418         Impression and Plan:  1. Light chain multiple myeloma.  On Velcade IV therapy on weekly bases since her wound is fully healed. She is doing well, I will measure her light chains with the next chemotherapy. Her counts slightly low today, so will hold her treatment. . 2. Anemia, multifactorial. Resolved at this time. 3. History of deep vein thrombosis.  She is on Coumadin 5 mg.  We will continue to monitor her PT and INR.  4. Abdominal wound infection.  The infection seems to have resolved. 5. Hypertension seems to be under relatively good control.  6. Mouth pain: no notable ulcers or thrush. I gave her magic mouth wash today.  7. Follow up in in 4 weeks.      [facility], MD 3/14/20133:43 PM

## 2011-09-06 ENCOUNTER — Telehealth: Payer: Self-pay | Admitting: Oncology

## 2011-09-06 NOTE — Telephone Encounter (Signed)
Advised pt on 3/14 that she would be able to p/u a sch on her 3/21 appt which she is aware of as she does not have an ans machine and is not home a lot.  All appts ready.   aom

## 2011-09-12 ENCOUNTER — Other Ambulatory Visit (HOSPITAL_BASED_OUTPATIENT_CLINIC_OR_DEPARTMENT_OTHER): Payer: Medicaid Other

## 2011-09-12 ENCOUNTER — Ambulatory Visit (HOSPITAL_BASED_OUTPATIENT_CLINIC_OR_DEPARTMENT_OTHER): Payer: Medicaid Other

## 2011-09-12 DIAGNOSIS — Z5112 Encounter for antineoplastic immunotherapy: Secondary | ICD-10-CM

## 2011-09-12 DIAGNOSIS — C9000 Multiple myeloma not having achieved remission: Secondary | ICD-10-CM

## 2011-09-12 LAB — CBC WITH DIFFERENTIAL/PLATELET
BASO%: 0.4 % (ref 0.0–2.0)
LYMPH%: 37.2 % (ref 14.0–49.7)
MCHC: 33 g/dL (ref 31.5–36.0)
MONO#: 0.4 10*3/uL (ref 0.1–0.9)
MONO%: 15.1 % — ABNORMAL HIGH (ref 0.0–14.0)
Platelets: 136 10*3/uL — ABNORMAL LOW (ref 145–400)
RBC: 3.74 10*6/uL (ref 3.70–5.45)
RDW: 19.6 % — ABNORMAL HIGH (ref 11.2–14.5)
WBC: 2.4 10*3/uL — ABNORMAL LOW (ref 3.9–10.3)
nRBC: 0 % (ref 0–0)

## 2011-09-12 LAB — PROTIME-INR
INR: 1.3 — ABNORMAL LOW (ref 2.00–3.50)
Protime: 15.6 Seconds — ABNORMAL HIGH (ref 10.6–13.4)

## 2011-09-12 MED ORDER — ONDANSETRON HCL 8 MG PO TABS
8.0000 mg | ORAL_TABLET | Freq: Once | ORAL | Status: AC
  Administered 2011-09-12: 8 mg via ORAL

## 2011-09-12 MED ORDER — BORTEZOMIB CHEMO SQ INJECTION 3.5 MG (2.5MG/ML)
1.3000 mg/m2 | Freq: Once | INTRAMUSCULAR | Status: AC
  Administered 2011-09-12: 3 mg via SUBCUTANEOUS
  Filled 2011-09-12: qty 3

## 2011-09-13 ENCOUNTER — Other Ambulatory Visit: Payer: Self-pay | Admitting: *Deleted

## 2011-09-13 ENCOUNTER — Other Ambulatory Visit: Payer: Self-pay

## 2011-09-13 DIAGNOSIS — C9001 Multiple myeloma in remission: Secondary | ICD-10-CM

## 2011-09-13 LAB — COMPREHENSIVE METABOLIC PANEL
ALT: 11 U/L (ref 0–35)
AST: 21 U/L (ref 0–37)
Chloride: 111 mEq/L (ref 96–112)
Creatinine, Ser: 1.22 mg/dL — ABNORMAL HIGH (ref 0.50–1.10)
Sodium: 141 mEq/L (ref 135–145)
Total Bilirubin: 0.4 mg/dL (ref 0.3–1.2)

## 2011-09-13 MED ORDER — MAGIC MOUTHWASH: 5.0000 mL | ORAL | Status: DC | PRN

## 2011-09-13 MED ORDER — PROMETHAZINE HCL 25 MG PO TABS: 25.0000 mg | ORAL_TABLET | Freq: Four times a day (QID) | ORAL | Status: AC | PRN

## 2011-09-13 MED ORDER — WARFARIN SODIUM 5 MG PO TABS: 5.0000 mg | ORAL_TABLET | Freq: Every day | ORAL | Status: DC

## 2011-09-17 ENCOUNTER — Other Ambulatory Visit: Payer: Self-pay | Admitting: *Deleted

## 2011-09-17 DIAGNOSIS — C9001 Multiple myeloma in remission: Secondary | ICD-10-CM

## 2011-09-17 MED ORDER — HYDROCODONE-ACETAMINOPHEN 7.5-750 MG PO TABS: 1.0000 | ORAL_TABLET | Freq: Four times a day (QID) | ORAL | Status: AC | PRN

## 2011-09-18 ENCOUNTER — Other Ambulatory Visit: Payer: Self-pay | Admitting: Oncology

## 2011-09-19 ENCOUNTER — Other Ambulatory Visit (HOSPITAL_BASED_OUTPATIENT_CLINIC_OR_DEPARTMENT_OTHER): Payer: Medicaid Other | Admitting: Lab

## 2011-09-19 ENCOUNTER — Ambulatory Visit: Payer: Medicaid Other

## 2011-09-19 DIAGNOSIS — C9000 Multiple myeloma not having achieved remission: Secondary | ICD-10-CM

## 2011-09-19 LAB — COMPREHENSIVE METABOLIC PANEL
ALT: 11 U/L (ref 0–35)
AST: 22 U/L (ref 0–37)
CO2: 24 mEq/L (ref 19–32)
Calcium: 8.7 mg/dL (ref 8.4–10.5)
Chloride: 110 mEq/L (ref 96–112)
Creatinine, Ser: 1.19 mg/dL — ABNORMAL HIGH (ref 0.50–1.10)
Sodium: 142 mEq/L (ref 135–145)
Total Protein: 7.6 g/dL (ref 6.0–8.3)

## 2011-09-19 LAB — CBC WITH DIFFERENTIAL/PLATELET
BASO%: 0.4 % (ref 0.0–2.0)
Eosinophils Absolute: 0 10*3/uL (ref 0.0–0.5)
LYMPH%: 29.8 % (ref 14.0–49.7)
MCHC: 32.8 g/dL (ref 31.5–36.0)
MONO#: 0.3 10*3/uL (ref 0.1–0.9)
NEUT#: 1.4 10*3/uL — ABNORMAL LOW (ref 1.5–6.5)
RBC: 3.43 10*6/uL — ABNORMAL LOW (ref 3.70–5.45)
RDW: 19.8 % — ABNORMAL HIGH (ref 11.2–14.5)
WBC: 2.4 10*3/uL — ABNORMAL LOW (ref 3.9–10.3)
lymph#: 0.7 10*3/uL — ABNORMAL LOW (ref 0.9–3.3)
nRBC: 0 % (ref 0–0)

## 2011-09-26 ENCOUNTER — Ambulatory Visit (HOSPITAL_BASED_OUTPATIENT_CLINIC_OR_DEPARTMENT_OTHER): Payer: Medicaid Other

## 2011-09-26 ENCOUNTER — Other Ambulatory Visit (HOSPITAL_BASED_OUTPATIENT_CLINIC_OR_DEPARTMENT_OTHER): Payer: Medicaid Other | Admitting: Lab

## 2011-09-26 DIAGNOSIS — C9000 Multiple myeloma not having achieved remission: Secondary | ICD-10-CM

## 2011-09-26 DIAGNOSIS — Z5111 Encounter for antineoplastic chemotherapy: Secondary | ICD-10-CM

## 2011-09-26 LAB — CBC WITH DIFFERENTIAL/PLATELET
BASO%: 1.2 % (ref 0.0–2.0)
EOS%: 0.1 % (ref 0.0–7.0)
HCT: 30.2 % — ABNORMAL LOW (ref 34.8–46.6)
LYMPH%: 32.5 % (ref 14.0–49.7)
MCH: 28.2 pg (ref 25.1–34.0)
MCHC: 32 g/dL (ref 31.5–36.0)
NEUT%: 48.8 % (ref 38.4–76.8)
Platelets: 137 10*3/uL — ABNORMAL LOW (ref 145–400)
RBC: 3.43 10*6/uL — ABNORMAL LOW (ref 3.70–5.45)
WBC: 2.1 10*3/uL — ABNORMAL LOW (ref 3.9–10.3)
lymph#: 0.7 10*3/uL — ABNORMAL LOW (ref 0.9–3.3)
nRBC: 0 % (ref 0–0)

## 2011-09-26 LAB — COMPREHENSIVE METABOLIC PANEL
ALT: 13 U/L (ref 0–35)
AST: 24 U/L (ref 0–37)
Alkaline Phosphatase: 65 U/L (ref 39–117)
BUN: 21 mg/dL (ref 6–23)
Creatinine, Ser: 1.31 mg/dL — ABNORMAL HIGH (ref 0.50–1.10)

## 2011-09-26 MED ORDER — BORTEZOMIB CHEMO SQ INJECTION 3.5 MG (2.5MG/ML)
1.3000 mg/m2 | Freq: Once | INTRAMUSCULAR | Status: AC
  Administered 2011-09-26: 3 mg via SUBCUTANEOUS
  Filled 2011-09-26: qty 3

## 2011-09-26 MED ORDER — ONDANSETRON HCL 8 MG PO TABS
8.0000 mg | ORAL_TABLET | Freq: Once | ORAL | Status: AC
  Administered 2011-09-26: 8 mg via ORAL

## 2011-09-26 NOTE — Progress Notes (Signed)
Per Dr. Clelia Croft, OK to treat with ANC 1.0

## 2011-10-03 ENCOUNTER — Other Ambulatory Visit: Payer: Self-pay | Admitting: Oncology

## 2011-10-03 ENCOUNTER — Ambulatory Visit (HOSPITAL_BASED_OUTPATIENT_CLINIC_OR_DEPARTMENT_OTHER): Payer: Medicaid Other

## 2011-10-03 ENCOUNTER — Other Ambulatory Visit: Payer: Medicaid Other | Admitting: Lab

## 2011-10-03 DIAGNOSIS — C9000 Multiple myeloma not having achieved remission: Secondary | ICD-10-CM

## 2011-10-03 DIAGNOSIS — Z5112 Encounter for antineoplastic immunotherapy: Secondary | ICD-10-CM

## 2011-10-03 LAB — CBC WITH DIFFERENTIAL/PLATELET
BASO%: 0.9 % (ref 0.0–2.0)
EOS%: 0 % (ref 0.0–7.0)
MCH: 28.3 pg (ref 25.1–34.0)
MCV: 86.1 fL (ref 79.5–101.0)
MONO%: 12.1 % (ref 0.0–14.0)
RBC: 3.53 10*6/uL — ABNORMAL LOW (ref 3.70–5.45)
RDW: 20.2 % — ABNORMAL HIGH (ref 11.2–14.5)
lymph#: 0.7 10*3/uL — ABNORMAL LOW (ref 0.9–3.3)
nRBC: 0 % (ref 0–0)

## 2011-10-03 LAB — COMPREHENSIVE METABOLIC PANEL
ALT: 11 U/L (ref 0–35)
AST: 27 U/L (ref 0–37)
Alkaline Phosphatase: 57 U/L (ref 39–117)
Potassium: 3.9 mEq/L (ref 3.5–5.3)
Sodium: 140 mEq/L (ref 135–145)
Total Bilirubin: 0.5 mg/dL (ref 0.3–1.2)
Total Protein: 7.2 g/dL (ref 6.0–8.3)

## 2011-10-03 MED ORDER — BORTEZOMIB CHEMO SQ INJECTION 3.5 MG (2.5MG/ML)
1.3000 mg/m2 | Freq: Once | INTRAMUSCULAR | Status: AC
  Administered 2011-10-03: 3 mg via SUBCUTANEOUS
  Filled 2011-10-03: qty 3

## 2011-10-03 MED ORDER — FLUCONAZOLE 100 MG PO TABS: 100.0000 mg | ORAL_TABLET | Freq: Every day | ORAL | Status: AC

## 2011-10-03 MED ORDER — ONDANSETRON HCL 8 MG PO TABS
8.0000 mg | ORAL_TABLET | Freq: Once | ORAL | Status: AC
  Administered 2011-10-03: 8 mg via ORAL

## 2011-10-03 NOTE — Patient Instructions (Signed)
[name] Discharge Instructions for Patients Receiving Chemotherapy  Today you received the following chemotherapy agents Velcade injection. To help prevent nausea and vomiting after your treatment, we encourage you to take your nausea medication per MD orders. Begin taking it as prescribed by your doctor. If you develop nausea and vomiting that is not controlled by your nausea medication, call the clinic. If it is after clinic hours your family physician or the after hours number for the clinic or go to the Emergency Department.   BELOW ARE SYMPTOMS THAT SHOULD BE REPORTED IMMEDIATELY:  *FEVER GREATER THAN 100.5 F  *CHILLS WITH OR WITHOUT FEVER  NAUSEA AND VOMITING THAT IS NOT CONTROLLED WITH YOUR NAUSEA MEDICATION  *UNUSUAL SHORTNESS OF BREATH  *UNUSUAL BRUISING OR BLEEDING  TENDERNESS IN MOUTH AND THROAT WITH OR WITHOUT PRESENCE OF ULCERS  *URINARY PROBLEMS  *BOWEL PROBLEMS  UNUSUAL RASH Items with * indicate a potential emergency and should be followed up as soon as possible.  One of the nurses will contact you 24 hours after your treatment. Please let the nurse know about any problems that you may have experienced. Feel free to call the clinic you have any questions or concerns. The clinic phone number is [phone].   I have been informed and understand all the instructions given to me. I know to contact the clinic, my physician, or go to the Emergency Department if any problems should occur. I do not have any questions at this time, but understand that I may call the clinic during office hours   should I have any questions or need assistance in obtaining follow up care.    __________________________________________  _____________  __________ Signature of Patient or Authorized Representative            Date                   Time    __________________________________________ Nurse's Signature

## 2011-10-03 NOTE — Progress Notes (Signed)
1430 Verbal order to treat with Velcade today per Dr. Clelia Croft with Neut @ 1.1 Informed Dr Clelia Croft patient complaining of yeast infection.

## 2011-10-10 ENCOUNTER — Ambulatory Visit: Payer: Medicaid Other

## 2011-10-10 ENCOUNTER — Other Ambulatory Visit (HOSPITAL_BASED_OUTPATIENT_CLINIC_OR_DEPARTMENT_OTHER): Payer: Medicaid Other | Admitting: Lab

## 2011-10-10 ENCOUNTER — Ambulatory Visit (HOSPITAL_BASED_OUTPATIENT_CLINIC_OR_DEPARTMENT_OTHER): Payer: Medicaid Other | Admitting: Oncology

## 2011-10-10 DIAGNOSIS — C9000 Multiple myeloma not having achieved remission: Secondary | ICD-10-CM

## 2011-10-10 LAB — CBC WITH DIFFERENTIAL/PLATELET
Basophils Absolute: 0 10*3/uL (ref 0.0–0.1)
Eosinophils Absolute: 0 10*3/uL (ref 0.0–0.5)
HCT: 31.3 % — ABNORMAL LOW (ref 34.8–46.6)
HGB: 10.2 g/dL — ABNORMAL LOW (ref 11.6–15.9)
MCV: 86.7 fL (ref 79.5–101.0)
MONO%: 17.7 % — ABNORMAL HIGH (ref 0.0–14.0)
NEUT#: 0.8 10*3/uL — ABNORMAL LOW (ref 1.5–6.5)
NEUT%: 44.6 % (ref 38.4–76.8)
RDW: 20.5 % — ABNORMAL HIGH (ref 11.2–14.5)

## 2011-10-10 LAB — TECHNOLOGIST REVIEW

## 2011-10-10 NOTE — Progress Notes (Signed)
Hematology and Oncology Follow Up Visit  [name] [id number] 01-13-1951 61 y.o. 10/10/2011 3:06 PM  CC: [facility]   Principle Diagnosis:  This is a 61 year old female with the following issues:   1. 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  She had a free kappa light chain around 1500. 2. Diagnosis of deep vein thrombosis in July 2011.    Prior Therapy:    1. Initially treated with melphalan and prednisone.  She had a partial response with free light chains down as low as 19, M spike down to 0.52 g/dL.  2. Patient treated with Revlimid at 25 mg 3 weeks on and 1 week off between November 2009 until November 2010.  She had a partial response, light chains down to 33 and M spike was not detected.   3. She was treated on maintenance Revlimid 15 mg to maintain her partial response. 4. She was on Zometa intermittently, on hold know. 5. Currently anticoagulated with Coumadin 7.5 mg since July 2011.  Current therapy :She is on Velcade 1.3 mg/sq m for a total of 2.9 mg weekly, which started on 02/21/2011 with weekly dexamethasone .  Treatment has been on hold due to recurrent hospitalization for wound infection. Treatment restarted in in 06/2011.  Interim History:  [name] presents today for an office followup visit.  This is a pleasant 61 year old female with a few comorbid conditions as outlined above. She tolerated Velcade well without complications. She is reporting some mouth pain at this time. She has not has any wound infections since VAC was removed.  She does not report any obvious bleeding such as epistaxis, gum bleeding, hemoptysis, melena, hematochezia or hematuria.  She does have some intermittent swelling in her right lower extremity.  She does not report any headaches, visual changes, any dysphagia, odynophagia, any nausea, vomiting, diarrhea, constipation, chest pain,  shortness of breath, productive cough, abdominal pain, abdominal swelling, lower extremity paresthesias, bowel or bladder incontinence.  She denies any fevers, chills or night sweats.  She is not reporting any neuropathy.    Medications: I have reviewed the patient's current medications.  Allergies:  Allergies  Allergen Reactions  . Ibuprofen Other (See Comments)    Patient does not remember.   . Morphine And Related Other (See Comments)    Patient does not remember reaction, believes it caused raised marks on skin.    Past Medical History, Surgical history, Social history, and Family History were reviewed and updated.  Review of Systems: Constitutional:  Negative for fever, chills, night sweats, anorexia, weight loss, pain. Cardiovascular: no chest pain or dyspnea on exertion Respiratory: no cough, shortness of breath, or wheezing Neurological: no TIA or stroke symptoms Dermatological: negative ENT: negative Skin: Negative. Gastrointestinal: no abdominal pain, change in bowel habits, or black or bloody stools Genito-Urinary: no dysuria, trouble voiding, or hematuria Hematological and Lymphatic: negative Breast: negative for breast lumps negative Musculoskeletal: negative Remaining ROS negative. Physical Exam: Blood pressure 123/70, pulse 78, temperature 97.8 F (36.6 C), temperature source Oral. ECOG: 1 General appearance: alert Head: Normocephalic, without obvious abnormality, atraumatic Neck: no adenopathy, no carotid bruit, no JVD, supple, symmetrical, trachea midline and thyroid not enlarged, symmetric, no tenderness/mass/nodules Lymph nodes: Cervical, supraclavicular, and axillary nodes normal. Heart:regular rate and rhythm, S1, S2 normal, no murmur, click, rub or gallop Lung:chest clear, no wheezing, rales, normal symmetric air entry, Heart exam - S1, S2 normal, no  murmur, no gallop, rate regular Abdomin: soft, non-tender, without masses or organomegaly. Wound appear  have healed. EXT:no erythema, induration, or nodules   Lab Results: Lab Results  Component Value Date   WBC 1.8* 10/10/2011   HGB 10.2* 10/10/2011   HCT 31.3* 10/10/2011   MCV 86.7 10/10/2011   PLT 101* 10/10/2011     Chemistry      Component Value Date/Time   NA 140 10/03/2011 1349   K 3.9 10/03/2011 1349   CL 109 10/03/2011 1349   CO2 24 10/03/2011 1349   BUN 25* 10/03/2011 1349   CREATININE 1.38* 10/03/2011 1349      Component Value Date/Time   CALCIUM 9.0 10/03/2011 1349   ALKPHOS 57 10/03/2011 1349   AST 27 10/03/2011 1349   ALT 11 10/03/2011 1349   BILITOT 0.5 10/03/2011 1349         Impression and Plan:  1. Light chain multiple myeloma.  On Velcade IV therapy on weekly bases since her wound is fully healed. She is doing well. I will give her a treatment break and restage her in 4 weeks.  2. Anemia, multifactorial. Resolved at this time. 3. History of deep vein thrombosis.  She is on Coumadin 5 mg.  We will continue to monitor her PT and INR.  4. Abdominal wound infection.  The infection seems to have resolved. 5. Hypertension seems to be under relatively good control.  6. Mouth pain: no notable ulcers or thrush. I gave her magic mouth wash today.  7. Follow up in in 4 weeks.      [facility], MD 4/18/20133:06 PM

## 2011-10-11 ENCOUNTER — Telehealth: Payer: Self-pay | Admitting: Oncology

## 2011-10-11 LAB — COMPREHENSIVE METABOLIC PANEL
Albumin: 3.9 g/dL (ref 3.5–5.2)
BUN: 25 mg/dL — ABNORMAL HIGH (ref 6–23)
CO2: 25 mEq/L (ref 19–32)
Glucose, Bld: 65 mg/dL — ABNORMAL LOW (ref 70–99)
Potassium: 3.9 mEq/L (ref 3.5–5.3)
Sodium: 141 mEq/L (ref 135–145)
Total Bilirubin: 0.5 mg/dL (ref 0.3–1.2)
Total Protein: 7.4 g/dL (ref 6.0–8.3)

## 2011-10-11 LAB — KAPPA/LAMBDA LIGHT CHAINS: Lambda Free Lght Chn: 0.3 mg/dL — ABNORMAL LOW (ref 0.57–2.63)

## 2011-10-11 NOTE — Telephone Encounter (Signed)
S/w pt re appt for 5/14.

## 2011-10-15 ENCOUNTER — Other Ambulatory Visit: Payer: Self-pay | Admitting: Oncology

## 2011-10-15 MED ORDER — OXYCODONE HCL 5 MG PO TABS: 5.0000 mg | ORAL_TABLET | ORAL | Status: DC | PRN

## 2011-10-22 ENCOUNTER — Other Ambulatory Visit: Payer: Self-pay | Admitting: *Deleted

## 2011-10-22 NOTE — Telephone Encounter (Signed)
Pt. Had called Krisitin Curcio NP last week for a change in pain medicine.  Vicodin not effective.  Pt. Never picked up script.  Refill request received today for Vicodin from [facility].  Called pt. And she will pick up script for oxycodone this Friday, therefore refill for vicodin denied.

## 2011-11-05 ENCOUNTER — Other Ambulatory Visit (HOSPITAL_BASED_OUTPATIENT_CLINIC_OR_DEPARTMENT_OTHER): Payer: Medicaid Other | Admitting: Lab

## 2011-11-05 ENCOUNTER — Other Ambulatory Visit: Payer: Self-pay | Admitting: *Deleted

## 2011-11-05 ENCOUNTER — Ambulatory Visit (HOSPITAL_BASED_OUTPATIENT_CLINIC_OR_DEPARTMENT_OTHER): Payer: Medicaid Other | Admitting: Oncology

## 2011-11-05 DIAGNOSIS — Z86718 Personal history of other venous thrombosis and embolism: Secondary | ICD-10-CM

## 2011-11-05 DIAGNOSIS — I10 Essential (primary) hypertension: Secondary | ICD-10-CM

## 2011-11-05 DIAGNOSIS — C9000 Multiple myeloma not having achieved remission: Secondary | ICD-10-CM

## 2011-11-05 DIAGNOSIS — D649 Anemia, unspecified: Secondary | ICD-10-CM

## 2011-11-05 LAB — CBC WITH DIFFERENTIAL/PLATELET
Basophils Absolute: 0 10*3/uL (ref 0.0–0.1)
Eosinophils Absolute: 0 10*3/uL (ref 0.0–0.5)
HGB: 11.2 g/dL — ABNORMAL LOW (ref 11.6–15.9)
MCV: 87 fL (ref 79.5–101.0)
MONO#: 0.2 10*3/uL (ref 0.1–0.9)
NEUT#: 0.7 10*3/uL — ABNORMAL LOW (ref 1.5–6.5)
RBC: 3.91 10*6/uL (ref 3.70–5.45)
RDW: 19.9 % — ABNORMAL HIGH (ref 11.2–14.5)
WBC: 1.6 10*3/uL — ABNORMAL LOW (ref 3.9–10.3)
lymph#: 0.7 10*3/uL — ABNORMAL LOW (ref 0.9–3.3)
nRBC: 0 % (ref 0–0)

## 2011-11-05 LAB — COMPREHENSIVE METABOLIC PANEL
ALT: 13 U/L (ref 0–35)
Albumin: 3.9 g/dL (ref 3.5–5.2)
CO2: 24 mEq/L (ref 19–32)
Chloride: 106 mEq/L (ref 96–112)
Glucose, Bld: 57 mg/dL — ABNORMAL LOW (ref 70–99)
Potassium: 3.9 mEq/L (ref 3.5–5.3)
Sodium: 139 mEq/L (ref 135–145)
Total Bilirubin: 0.3 mg/dL (ref 0.3–1.2)
Total Protein: 8.6 g/dL — ABNORMAL HIGH (ref 6.0–8.3)

## 2011-11-05 MED ORDER — LIDOCAINE-PRILOCAINE 2.5-2.5 % EX CREA: TOPICAL_CREAM | CUTANEOUS | Status: DC | PRN

## 2011-11-05 NOTE — Telephone Encounter (Signed)
gv pt appt schedule for June.  °

## 2011-11-05 NOTE — Progress Notes (Signed)
Hematology and Oncology Follow Up Visit  [name] [id number] 1951-01-10 61 y.o. 11/05/2011 3:19 PM  CC: [facility]   Principle Diagnosis:  This is a 61 year old female with the following issues:   1. 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  She had a free kappa light chain around 1500. 2. Diagnosis of deep vein thrombosis in July 2011.    Prior Therapy:    1. Initially treated with melphalan and prednisone.  She had a partial response with free light chains down as low as 19, M spike down to 0.52 g/dL.  2. Patient treated with Revlimid at 25 mg 3 weeks on and 1 week off between November 2009 until November 2010.  She had a partial response, light chains down to 33 and M spike was not detected.   3. She was treated on maintenance Revlimid 15 mg to maintain her partial response. 4. She was on Zometa intermittently, on hold know. 5. Currently anticoagulated with Coumadin 7.5 mg since July 2011. 6.     She is owas Velcade 1.3 mg/sq m for a total of 2.9 mg weekly, which started on 02/21/2011 with weekly dexamethasone .  Treatment has been on hold due to recurrent hospitalization for wound infection. Treatment restarted in in 06/2011 till 09/2011.  Current therapy: Observation and surveillance.   Interim History:  [name] presents today for an office followup visit.  This is a pleasant 61 year old female with a few comorbid conditions as outlined above. She tolerated Velcade well without complications. But is feeling better since Velcade was stopped. She is reporting some mouth pain at this time.  She does not report any obvious bleeding such as epistaxis, gum bleeding, hemoptysis, melena, hematochezia or hematuria.  She does have some intermittent swelling in her right lower extremity.  She does not report any headaches, visual changes, any dysphagia, odynophagia, any nausea, vomiting,  diarrhea, constipation, chest pain, shortness of breath, productive cough, abdominal pain, abdominal swelling, lower extremity paresthesias, bowel or bladder incontinence.  She denies any fevers, chills or night sweats.     Medications: I have reviewed the patient's current medications.  Allergies:  Allergies  Allergen Reactions  . Ibuprofen Other (See Comments)    Patient does not remember.   . Morphine And Related Other (See Comments)    Patient does not remember reaction, believes it caused raised marks on skin.    Past Medical History, Surgical history, Social history, and Family History were reviewed and updated.  Review of Systems: Constitutional:  Negative for fever, chills, night sweats, anorexia, weight loss, pain. Cardiovascular: no chest pain or dyspnea on exertion Respiratory: no cough, shortness of breath, or wheezing Neurological: no TIA or stroke symptoms Dermatological: negative ENT: negative Skin: Negative. Gastrointestinal: no abdominal pain, change in bowel habits, or black or bloody stools Genito-Urinary: no dysuria, trouble voiding, or hematuria Hematological and Lymphatic: negative Breast: negative for breast lumps negative Musculoskeletal: negative Remaining ROS negative. Physical Exam: Blood pressure 119/78, pulse 69, temperature 97.4 F (36.3 C), temperature source Oral. ECOG: 1 General appearance: alert Head: Normocephalic, without obvious abnormality, atraumatic Neck: no adenopathy, no carotid bruit, no JVD, supple, symmetrical, trachea midline and thyroid not enlarged, symmetric, no tenderness/mass/nodules Lymph nodes: Cervical, supraclavicular, and axillary nodes normal. Heart:regular rate and rhythm, S1, S2 normal, no murmur, click, rub or gallop Lung:chest clear, no wheezing, rales, normal symmetric air entry, Heart exam - S1, S2  normal, no murmur, no gallop, rate regular Abdomin: soft, non-tender, without masses or organomegaly. Wound appear  have healed. EXT:no erythema, induration, or nodules   Lab Results: Lab Results  Component Value Date   WBC 1.8* 10/10/2011   HGB 10.2* 10/10/2011   HCT 31.3* 10/10/2011   MCV 86.7 10/10/2011   PLT 101* 10/10/2011     Chemistry      Component Value Date/Time   NA 141 10/10/2011 1419   K 3.9 10/10/2011 1419   CL 109 10/10/2011 1419   CO2 25 10/10/2011 1419   BUN 25* 10/10/2011 1419   CREATININE 1.45* 10/10/2011 1419      Component Value Date/Time   CALCIUM 8.3* 10/10/2011 1419   ALKPHOS 59 10/10/2011 1419   AST 23 10/10/2011 1419   ALT 11 10/10/2011 1419   BILITOT 0.5 10/10/2011 1419         Impression and Plan:  1. Light chain multiple myeloma.  She was Velcade IV therapy on weekly bases since her wound is fully healed. She is doing well. I will give her a treatment break for now. I will restart therapy with relapse. Likely use Kyprolis at that time.  2. Anemia, multifactorial. Resolved at this time. 3. History of deep vein thrombosis.  She is on Coumadin 5 mg.  We will continue to monitor her PT and INR.  4. Abdominal wound infection.  The infection seems to have resolved. 5. Hypertension seems to be under relatively good control.  6. Follow up in in 4-6 weeks.  7. Port management: We will flush her port today and every 6 weeks.      [facility], MD 5/14/20133:19 PM

## 2011-11-06 IMAGING — CR DG ABDOMEN ACUTE W/ 1V CHEST
3 series · 3 of 3 positions shown · non-contrast
Comparison: Chest x-ray of 11/22/2010 and abdomen films of
11/30/2010

CLINICAL DATA: Mid abdominal pain, nausea, vomiting, diarrhea for 2
days

ACUTE ABDOMEN SERIES (ABDOMEN 2 VIEW & CHEST 1 VIEW)

[w chest pa]
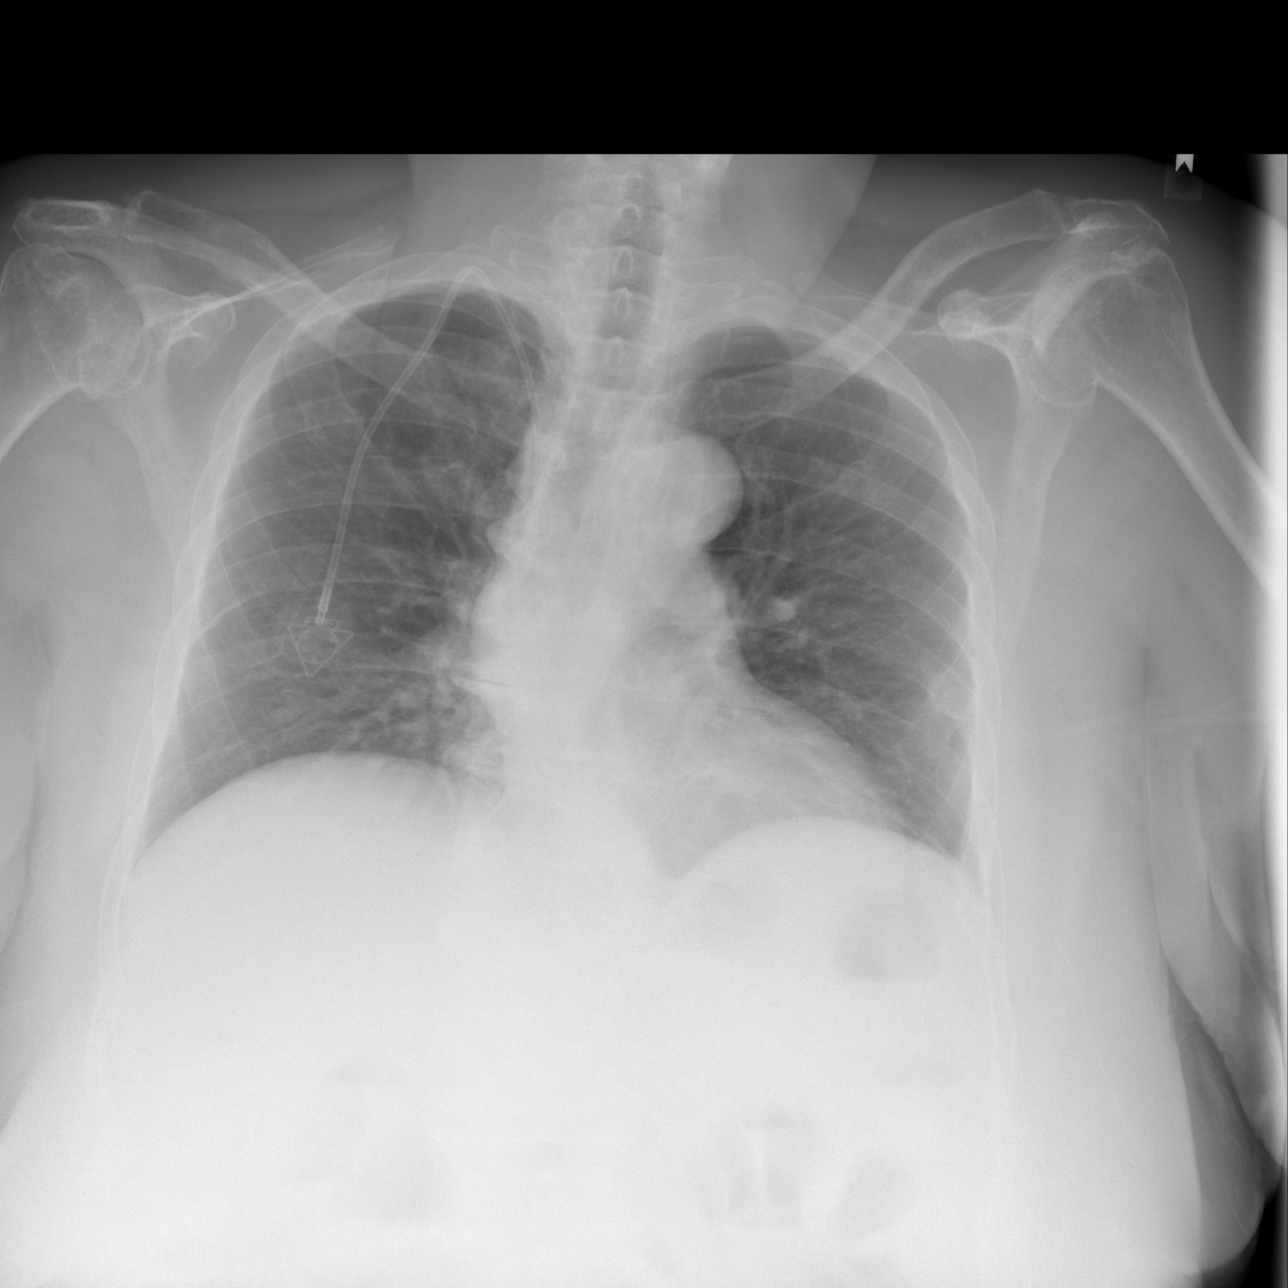

[w abdomen upright *]
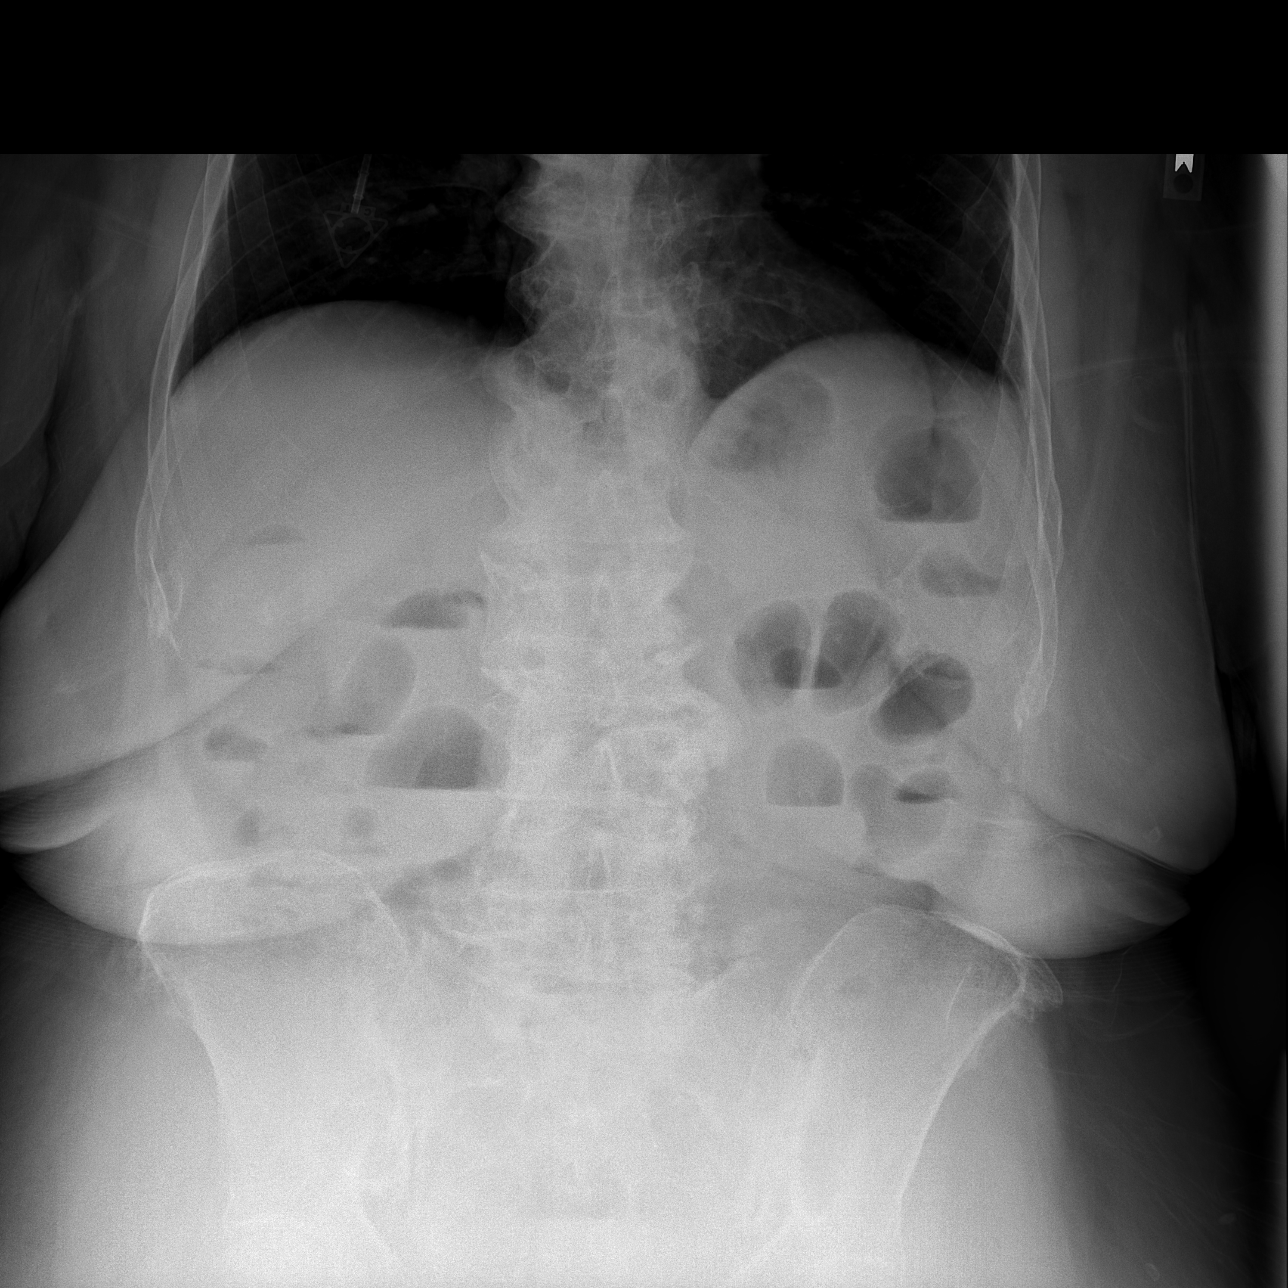

[t abdomen supine]
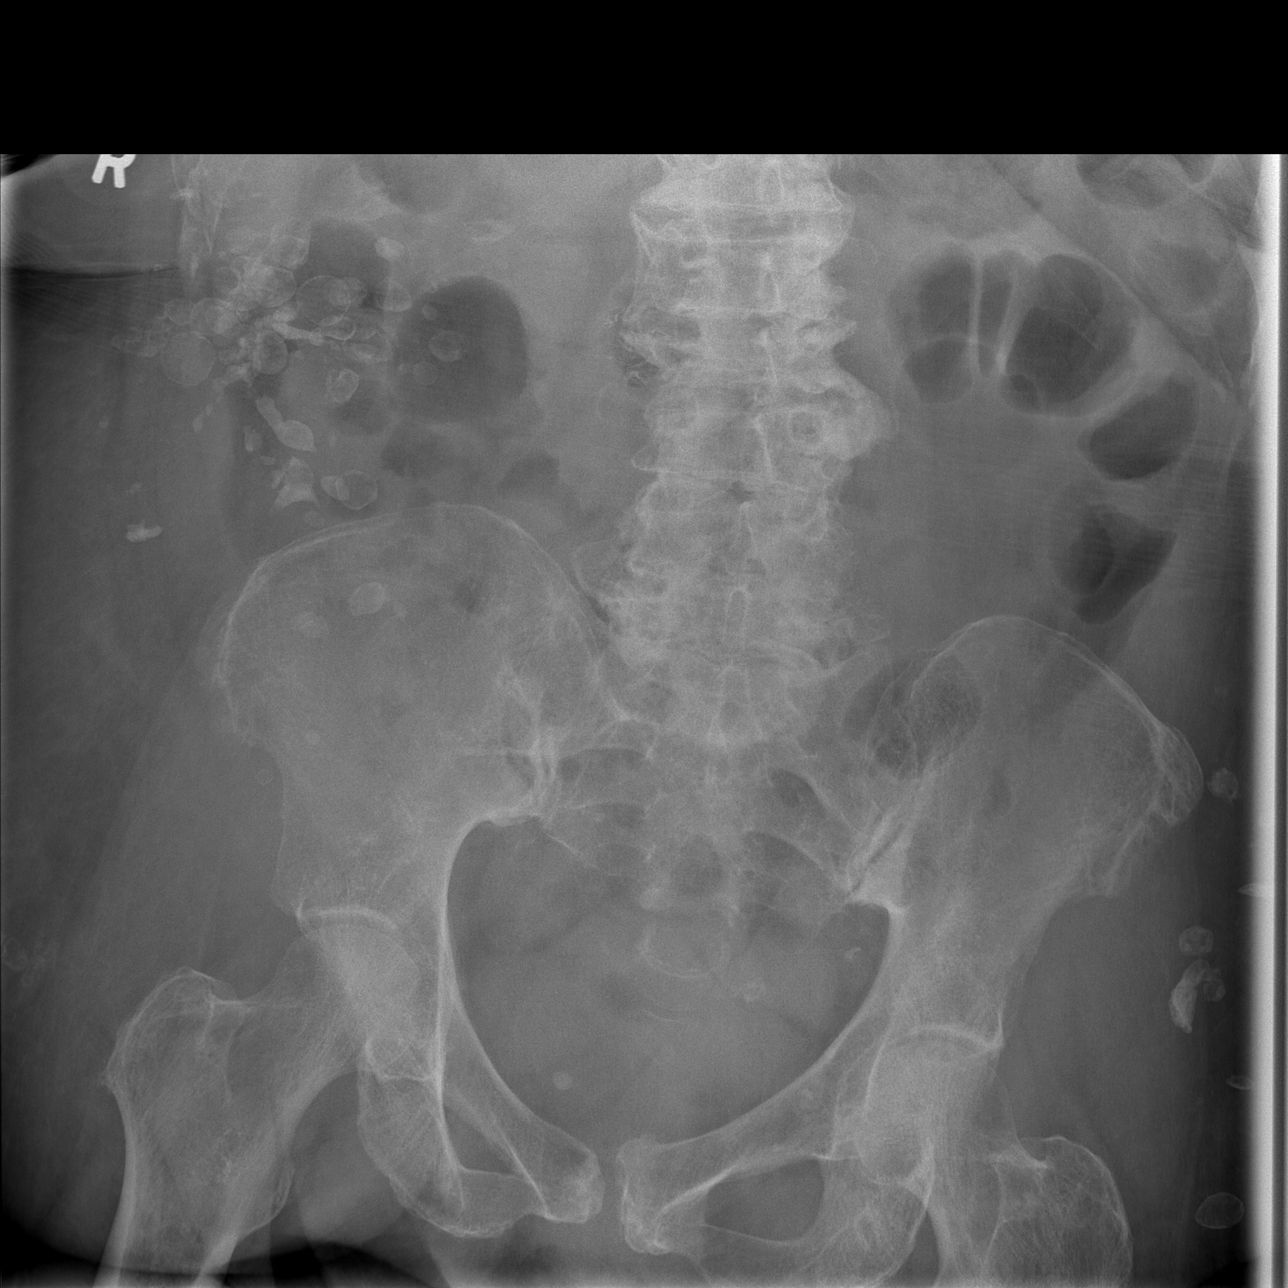

[3 of 3 positions shown; findings below may reference images not displayed]

FINDINGS: The lungs appear clear.  Cardiomegaly is stable.  Right-
sided Port-A-Cath remains.  There are degenerative changes
throughout the thoracic spine.

There are air-fluid levels on the lateral view which appear to be
within colon and may be seen with diarrhea or recent enemas.  No
definite bowel obstruction is seen.  No free air is noted.
Multiple soft tissue calcifications are present in the buttocks and
right lower back.  There are diffuse degenerative changes
throughout the lumbar spine.
IMPRESSION: 1.  Air-fluid levels in the colon may be due to diarrhea or recent
enemas.  No bowel obstruction or free air.
2.  Cardiomegaly.  No active lung disease.
3.  Diffuse degenerative change throughout the thoracolumbar spine.

## 2011-11-07 LAB — SPEP & IFE WITH QIG
Alpha-2-Globulin: 10.5 % (ref 7.1–11.8)
Beta 2: 3.1 % — ABNORMAL LOW (ref 3.2–6.5)
Beta Globulin: 4.8 % (ref 4.7–7.2)
IgA: 24 mg/dL — ABNORMAL LOW (ref 69–380)
IgG (Immunoglobin G), Serum: 1830 mg/dL — ABNORMAL HIGH (ref 690–1700)
M-Spike, %: 0.07 g/dL
Total Protein, Serum Electrophoresis: 8.1 g/dL (ref 6.0–8.3)

## 2011-11-07 LAB — KAPPA/LAMBDA LIGHT CHAINS: Lambda Free Lght Chn: 0.81 mg/dL (ref 0.57–2.63)

## 2011-11-12 ENCOUNTER — Other Ambulatory Visit: Payer: Self-pay

## 2011-11-12 DIAGNOSIS — C9001 Multiple myeloma in remission: Secondary | ICD-10-CM

## 2011-11-12 MED ORDER — WARFARIN SODIUM 5 MG PO TABS: 5.0000 mg | ORAL_TABLET | Freq: Every day | ORAL | Status: DC

## 2011-12-19 ENCOUNTER — Ambulatory Visit (HOSPITAL_BASED_OUTPATIENT_CLINIC_OR_DEPARTMENT_OTHER): Payer: Medicaid Other | Admitting: Oncology

## 2011-12-19 ENCOUNTER — Telehealth: Payer: Self-pay | Admitting: Oncology

## 2011-12-19 ENCOUNTER — Other Ambulatory Visit: Payer: Medicaid Other | Admitting: Lab

## 2011-12-19 VITALS — BP 125/71 | HR 78 | Temp 97.0°F

## 2011-12-19 DIAGNOSIS — C9000 Multiple myeloma not having achieved remission: Secondary | ICD-10-CM

## 2011-12-19 DIAGNOSIS — Z7901 Long term (current) use of anticoagulants: Secondary | ICD-10-CM

## 2011-12-19 DIAGNOSIS — Z86718 Personal history of other venous thrombosis and embolism: Secondary | ICD-10-CM

## 2011-12-19 LAB — COMPREHENSIVE METABOLIC PANEL
ALT: 15 U/L (ref 0–35)
BUN: 26 mg/dL — ABNORMAL HIGH (ref 6–23)
CO2: 22 mEq/L (ref 19–32)
Calcium: 9.2 mg/dL (ref 8.4–10.5)
Creatinine, Ser: 1.24 mg/dL — ABNORMAL HIGH (ref 0.50–1.10)
Total Bilirubin: 0.3 mg/dL (ref 0.3–1.2)

## 2011-12-19 LAB — CBC WITH DIFFERENTIAL/PLATELET
BASO%: 1 % (ref 0.0–2.0)
Basophils Absolute: 0 10*3/uL (ref 0.0–0.1)
EOS%: 0.4 % (ref 0.0–7.0)
HCT: 33.6 % — ABNORMAL LOW (ref 34.8–46.6)
HGB: 11 g/dL — ABNORMAL LOW (ref 11.6–15.9)
LYMPH%: 34.7 % (ref 14.0–49.7)
MCH: 29.5 pg (ref 25.1–34.0)
MCHC: 32.6 g/dL (ref 31.5–36.0)
NEUT%: 49.2 % (ref 38.4–76.8)
Platelets: 104 10*3/uL — ABNORMAL LOW (ref 145–400)

## 2011-12-19 LAB — PROTIME-INR: Protime: 19.2 Seconds — ABNORMAL HIGH (ref 10.6–13.4)

## 2011-12-19 MED ORDER — ZOLPIDEM TARTRATE 10 MG PO TABS: 10.0000 mg | ORAL_TABLET | Freq: Every evening | ORAL | Status: DC | PRN

## 2011-12-19 NOTE — Progress Notes (Signed)
Hematology and Oncology Follow Up Visit  [name] [id number] 09-Dec-1950 61 y.o. 12/19/2011 3:41 PM  CC: [facility]   Principle Diagnosis:  This is a 61 year old female with the following issues:   1. 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  She had a free kappa light chain around 1500. 2. Diagnosis of deep vein thrombosis in July 2011.   Prior Therapy:    1. Initially treated with melphalan and prednisone.  She had a partial response with free light chains down as low as 19, M spike down to 0.52 g/dL.  2. Patient treated with Revlimid at 25 mg 3 weeks on and 1 week off between November 2009 until November 2010.  She had a partial response, light chains down to 33 and M spike was not detected.   3. She was treated on maintenance Revlimid 15 mg to maintain her partial response. 4. She was on Zometa intermittently, on hold know. 5. Currently anticoagulated with Coumadin 7.5 mg since July 2011. 6.     She is owas Velcade 1.3 mg/sq m for a total of 2.9 mg weekly, which started on 02/21/2011 with weekly dexamethasone .  Treatment has been on hold due to recurrent hospitalization for wound infection. Treatment restarted in in 06/2011 till 09/2011.  Current therapy: Observation and surveillance.   Interim History:  [name] presents today for an office followup visit.  This is a pleasant 61 year old female with a few comorbid conditions as outlined above. She tolerated Velcade well without complications in the past. But is feeling better since Velcade was stopped. She is reporting some mouth pain at this time. She does not report any obvious bleeding such as epistaxis, gum bleeding, hemoptysis, melena, hematochezia or hematuria.  She does have some intermittent swelling in her right lower extremity.  She does not report any headaches, visual changes, any dysphagia, odynophagia, any nausea,  vomiting, diarrhea, constipation, chest pain, shortness of breath, productive cough, abdominal pain, abdominal swelling, lower extremity paresthesias, bowel or bladder incontinence.  She denies any fevers, chills or night sweats.     Medications: I have reviewed the patient's current medications.  Allergies:  Allergies  Allergen Reactions  . Ibuprofen Other (See Comments)    Patient does not remember.   . Morphine And Related Other (See Comments)    Patient does not remember reaction, believes it caused raised marks on skin.    Past Medical History, Surgical history, Social history, and Family History were reviewed and updated.  Review of Systems: Constitutional:  Negative for fever, chills, night sweats, anorexia, weight loss, pain. Cardiovascular: no chest pain or dyspnea on exertion Respiratory: no cough, shortness of breath, or wheezing Neurological: no TIA or stroke symptoms Dermatological: negative ENT: negative Skin: Negative. Gastrointestinal: no abdominal pain, change in bowel habits, or black or bloody stools Genito-Urinary: no dysuria, trouble voiding, or hematuria Hematological and Lymphatic: negative Breast: negative for breast lumps negative Musculoskeletal: negative Remaining ROS negative. Physical Exam: Blood pressure 125/71, pulse 78, temperature 97 F (36.1 C), temperature source Oral. ECOG: 1 General appearance: alert Head: Normocephalic, without obvious abnormality, atraumatic Neck: no adenopathy, no carotid bruit, no JVD, supple, symmetrical, trachea midline and thyroid not enlarged, symmetric, no tenderness/mass/nodules Lymph nodes: Cervical, supraclavicular, and axillary nodes normal. Heart:regular rate and rhythm, S1, S2 normal, no murmur, click, rub or gallop Lung:chest clear, no wheezing, rales, normal symmetric air entry, Heart exam - S1,  S2 normal, no murmur, no gallop, rate regular Abdomin: soft, non-tender, without masses or organomegaly. Wound  appear have healed. EXT:no erythema, induration, or nodules   Lab Results: Lab Results  Component Value Date   WBC 1.9* 12/19/2011   HGB 11.0* 12/19/2011   HCT 33.6* 12/19/2011   MCV 90.4 12/19/2011   PLT 104* 12/19/2011     Chemistry      Component Value Date/Time   NA 139 11/05/2011 1446   K 3.9 11/05/2011 1446   CL 106 11/05/2011 1446   CO2 24 11/05/2011 1446   BUN 30* 11/05/2011 1446   CREATININE 1.39* 11/05/2011 1446      Component Value Date/Time   CALCIUM 9.4 11/05/2011 1446   ALKPHOS 70 11/05/2011 1446   AST 25 11/05/2011 1446   ALT 13 11/05/2011 1446   BILITOT 0.3 11/05/2011 1446         Impression and Plan:  1. Light chain multiple myeloma.  She was Velcade IV therapy on weekly bases but has been treatment break for now. I will restart therapy with relapse likely use Kyprolis at that time. I anticipate starting in 01/2012 I am repeating her protein studies today.  2. Anemia, multifactorial. Resolved at this time. 3. History of deep vein thrombosis.  She is on Coumadin 5 mg.  We will continue to monitor her PT and INR.  4. Abdominal wound infection.  The infection seems to have resolved. 5. Hypertension seems to be under relatively good control.  6. Follow up in in 4weeks.  7. Port management: We will flush her port today and every 6 weeks.      [facility], MD 6/27/20133:41 PM

## 2011-12-19 NOTE — Telephone Encounter (Signed)
Gv pt appt for aug2013 °

## 2011-12-23 LAB — SPEP & IFE WITH QIG
Alpha-2-Globulin: 10.1 % (ref 7.1–11.8)
Beta 2: 3 % — ABNORMAL LOW (ref 3.2–6.5)
Beta Globulin: 4.5 % — ABNORMAL LOW (ref 4.7–7.2)
Gamma Globulin: 25.4 % — ABNORMAL HIGH (ref 11.1–18.8)
IgA: 19 mg/dL — ABNORMAL LOW (ref 69–380)
IgG (Immunoglobin G), Serum: 1860 mg/dL — ABNORMAL HIGH (ref 690–1700)
M-Spike, %: 0.09 g/dL
Total Protein, Serum Electrophoresis: 7.6 g/dL (ref 6.0–8.3)

## 2011-12-23 LAB — KAPPA/LAMBDA LIGHT CHAINS: Lambda Free Lght Chn: 0.77 mg/dL (ref 0.57–2.63)

## 2012-01-09 ENCOUNTER — Telehealth: Payer: Self-pay | Admitting: *Deleted

## 2012-01-09 MED ORDER — CIPROFLOXACIN HCL 500 MG PO TABS: 500.0000 mg | ORAL_TABLET | Freq: Two times a day (BID) | ORAL | Status: AC

## 2012-01-09 NOTE — Telephone Encounter (Signed)
Call from pt stating she thinks she has another bladder infection, asking if Dr. Clelia Croft will call in antibiotic that she took last year. Suggested she contact her PCP for dysuria symptoms. She reports she has not heard back from [facility]. Will review with Dr. Clelia Croft.

## 2012-01-09 NOTE — Telephone Encounter (Signed)
Order received for Cipro BID x 7 days, per Dr. Clelia Croft. Continue Coumadin at same dose. Follow up as scheduled, per Dr. Clelia Croft. Pt read back instructions, voiced understanding. I instructed her to call office for bleeding/ unexplained bruising.

## 2012-01-13 ENCOUNTER — Telehealth: Payer: Self-pay | Admitting: *Deleted

## 2012-01-13 ENCOUNTER — Other Ambulatory Visit: Payer: Self-pay | Admitting: Oncology

## 2012-01-13 MED ORDER — OXYCODONE HCL 5 MG PO TABS: 5.0000 mg | ORAL_TABLET | ORAL | Status: DC | PRN

## 2012-01-13 NOTE — Telephone Encounter (Signed)
Called patient to pick up script for Oxycodone IR as written per Belenda Cruise, DNP.  Patient aware.

## 2012-01-13 NOTE — Telephone Encounter (Signed)
Patient called reporting she is having pain up and down the left side and back.  "I can hardly walk."  Rates pain as a six to an eight on the pain scale.  Has two more days of Cipro to take for UTI.  Reports tylenol and ibuprofen don't touch the pain and she has taken the oxy IR 5mg  but it relieves pain for only 2 hrs.  Has run out of these.  Does not want hydrocodone reporting this doesn't work for her pain.  Will notify providers.

## 2012-01-15 ENCOUNTER — Other Ambulatory Visit: Payer: Self-pay | Admitting: *Deleted

## 2012-01-15 DIAGNOSIS — C9000 Multiple myeloma not having achieved remission: Secondary | ICD-10-CM

## 2012-01-16 ENCOUNTER — Other Ambulatory Visit: Payer: Self-pay | Admitting: *Deleted

## 2012-01-16 MED ORDER — METHOCARBAMOL 500 MG PO TABS: 500.0000 mg | ORAL_TABLET | Freq: Four times a day (QID) | ORAL | Status: AC

## 2012-01-16 NOTE — Telephone Encounter (Signed)
Patient called wanting a prescription for a muscle relaxant so she can walk again and be able to come to the appointment Monday for lab work. Patient was very irate on the phone (on the onset). From what this RN gathers, she called her previous [facility] from [place] and they denied to fill her script because she is no longer actively being treated. It sounds like the patient was taking Methocarbamol 500mg .  Per Dr. Clelia Croft, okay to fill prescription as sent to her pharmacy.  Patient verbalized understanding.

## 2012-01-17 ENCOUNTER — Telehealth: Payer: Self-pay | Admitting: Oncology

## 2012-01-17 NOTE — Telephone Encounter (Signed)
S/w pt re appt for 7/29. Pt also aware of 8/8 appt.

## 2012-01-20 ENCOUNTER — Other Ambulatory Visit (HOSPITAL_BASED_OUTPATIENT_CLINIC_OR_DEPARTMENT_OTHER): Payer: Medicaid Other | Admitting: Lab

## 2012-01-20 DIAGNOSIS — C9000 Multiple myeloma not having achieved remission: Secondary | ICD-10-CM

## 2012-01-20 LAB — URINALYSIS, MICROSCOPIC - CHCC
Bilirubin (Urine): NEGATIVE
Blood: NEGATIVE
Leukocyte Esterase: NEGATIVE
pH: 6 (ref 4.6–8.0)

## 2012-01-22 ENCOUNTER — Telehealth: Payer: Self-pay | Admitting: *Deleted

## 2012-01-22 NOTE — Telephone Encounter (Signed)
Patient called asking about UA/CS drawn 01-20-2012.  Informed her UA is okay and we're awiting the culture.  Advised to drink lots of water and cranberry juice and we'll be in touch when Cx results received.

## 2012-01-24 LAB — URINE CULTURE

## 2012-01-27 ENCOUNTER — Telehealth: Payer: Self-pay | Admitting: *Deleted

## 2012-01-27 NOTE — Telephone Encounter (Addendum)
Called patient with U/A c+s results- all were negative.   Patient verbalized understanding.  Confirmed appointment with patient for 01/30/2012 at 230pm.  Message copied by Sherre Poot on Mon Jan 27, 2012  9:13 AM ------      Message from: Clenton Pare R      Created: Mon Jan 27, 2012  8:57 AM       Call patient. Urine culture does not show a UTI (Insignificant growth). No antibiotics are indicated at this time.            Thanks

## 2012-01-30 ENCOUNTER — Other Ambulatory Visit (HOSPITAL_BASED_OUTPATIENT_CLINIC_OR_DEPARTMENT_OTHER): Payer: Medicaid Other | Admitting: Lab

## 2012-01-30 ENCOUNTER — Ambulatory Visit (HOSPITAL_COMMUNITY)
Admission: RE | Admit: 2012-01-30 | Discharge: 2012-01-30 | Disposition: A | Discharge status: Home / Self Care | Payer: Medicaid Other | Source: Ambulatory Visit | Attending: Oncology | Admitting: Oncology

## 2012-01-30 ENCOUNTER — Telehealth: Payer: Self-pay | Admitting: Oncology

## 2012-01-30 ENCOUNTER — Ambulatory Visit (HOSPITAL_BASED_OUTPATIENT_CLINIC_OR_DEPARTMENT_OTHER): Payer: Medicaid Other | Admitting: Oncology

## 2012-01-30 VITALS — BP 121/73 | HR 81 | Temp 97.1°F | Resp 20 | Ht 68.0 in | Wt 248.9 lb

## 2012-01-30 DIAGNOSIS — Z86718 Personal history of other venous thrombosis and embolism: Secondary | ICD-10-CM

## 2012-01-30 DIAGNOSIS — C9000 Multiple myeloma not having achieved remission: Secondary | ICD-10-CM

## 2012-01-30 DIAGNOSIS — M25559 Pain in unspecified hip: Secondary | ICD-10-CM

## 2012-01-30 DIAGNOSIS — M79609 Pain in unspecified limb: Secondary | ICD-10-CM

## 2012-01-30 LAB — CBC WITH DIFFERENTIAL/PLATELET
Basophils Absolute: 0 10*3/uL (ref 0.0–0.1)
Eosinophils Absolute: 0 10*3/uL (ref 0.0–0.5)
HCT: 34.2 % — ABNORMAL LOW (ref 34.8–46.6)
HGB: 11.2 g/dL — ABNORMAL LOW (ref 11.6–15.9)
MCH: 29.1 pg (ref 25.1–34.0)
MONO#: 0.3 10*3/uL (ref 0.1–0.9)
NEUT#: 1.4 10*3/uL — ABNORMAL LOW (ref 1.5–6.5)
NEUT%: 55.4 % (ref 38.4–76.8)
RDW: 16.4 % — ABNORMAL HIGH (ref 11.2–14.5)
lymph#: 0.8 10*3/uL — ABNORMAL LOW (ref 0.9–3.3)

## 2012-01-30 LAB — TECHNOLOGIST REVIEW

## 2012-01-30 LAB — COMPREHENSIVE METABOLIC PANEL
Albumin: 4.1 g/dL (ref 3.5–5.2)
BUN: 39 mg/dL — ABNORMAL HIGH (ref 6–23)
CO2: 23 mEq/L (ref 19–32)
Calcium: 9.6 mg/dL (ref 8.4–10.5)
Chloride: 108 mEq/L (ref 96–112)
Creatinine, Ser: 1.84 mg/dL — ABNORMAL HIGH (ref 0.50–1.10)
Glucose, Bld: 65 mg/dL — ABNORMAL LOW (ref 70–99)
Potassium: 4.1 mEq/L (ref 3.5–5.3)

## 2012-01-30 MED ORDER — OXYCODONE-ACETAMINOPHEN 5-325 MG PO TABS
1.0000 | ORAL_TABLET | Freq: Once | ORAL | Status: AC
  Administered 2012-01-30: 1 via ORAL

## 2012-01-30 MED ORDER — OXYCODONE HCL 5 MG PO TABS: 5.0000 mg | ORAL_TABLET | ORAL | Status: DC | PRN

## 2012-01-30 NOTE — Telephone Encounter (Signed)
Pt sent for xray and given appt schedule for September.

## 2012-01-30 NOTE — Progress Notes (Signed)
Hematology and Oncology Follow Up Visit  [name] [id number] 05/30/1951 61 y.o. 01/30/2012 3:07 PM  CC: [facility]   Principle Diagnosis:  This is a 61 year old female with the following issues:   1. 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  She had a free kappa light chain around 1500. 2. Diagnosis of deep vein thrombosis in July 2011.   Prior Therapy:    1. Initially treated with melphalan and prednisone.  She had a partial response with free light chains down as low as 19, M spike down to 0.52 g/dL.  2. Patient treated with Revlimid at 25 mg 3 weeks on and 1 week off between November 2009 until November 2010.  She had a partial response, light chains down to 33 and M spike was not detected.   3. She was treated on maintenance Revlimid 15 mg to maintain her partial response. 4. She was on Zometa intermittently, on hold know. 5. Currently anticoagulated with Coumadin 7.5 mg since July 2011. 6.     She is owas Velcade 1.3 mg/sq m for a total of 2.9 mg weekly, which started on 02/21/2011 with weekly dexamethasone .  Treatment has been on hold due to recurrent hospitalization for wound infection. Treatment restarted in in 06/2011 till 09/2011.  Current therapy: Observation and surveillance.   Interim History:  [name] presents today for an office followup visit.  This is a pleasant 61 year old female with a few comorbid conditions as outlined above. She tolerated Velcade well without complications in the past. But is feeling better since Velcade was stopped. She is reporting pain in both hips radiating to the back of her thighs. No neurological symptoms. She is able to ambulate and actually pain is relieved by stretching and ambulation. She takes Oxycodone which helps.  She does not report any obvious bleeding such as epistaxis, gum bleeding, hemoptysis, melena, hematochezia or  hematuria.  She does have some intermittent swelling in her right lower extremity.  She does not report any headaches, visual changes, any dysphagia, odynophagia, any nausea, vomiting, diarrhea, constipation, chest pain, shortness of breath, productive cough, abdominal pain, abdominal swelling, lower extremity paresthesias, bowel or bladder incontinence.  She denies any fevers, chills or night sweats.     Medications: I have reviewed the patient's current medications.  Allergies:  Allergies  Allergen Reactions  . Ibuprofen Other (See Comments)    Patient does not remember.   . Morphine And Related Other (See Comments)    Patient does not remember reaction, believes it caused raised marks on skin.    Past Medical History, Surgical history, Social history, and Family History were reviewed and updated.  Review of Systems: Constitutional:  Negative for fever, chills, night sweats, anorexia, weight loss, pain. Cardiovascular: no chest pain or dyspnea on exertion Respiratory: no cough, shortness of breath, or wheezing Neurological: no TIA or stroke symptoms Dermatological: negative ENT: negative Skin: Negative. Gastrointestinal: no abdominal pain, change in bowel habits, or black or bloody stools Genito-Urinary: no dysuria, trouble voiding, or hematuria Hematological and Lymphatic: negative Breast: negative for breast lumps negative Musculoskeletal: negative Remaining ROS negative. Physical Exam: Blood pressure 121/73, pulse 81, temperature 97.1 F (36.2 C), temperature source Oral, resp. rate 20, height 5\' 8"  (1.727 m), weight 248 lb 14.4 oz (112.9 kg). ECOG: 1 General appearance: alert Head: Normocephalic, without obvious abnormality, atraumatic Neck: no adenopathy, no carotid bruit, no JVD, supple, symmetrical,  trachea midline and thyroid not enlarged, symmetric, no tenderness/mass/nodules Lymph nodes: Cervical, supraclavicular, and axillary nodes normal. Heart:regular rate and  rhythm, S1, S2 normal, no murmur, click, rub or gallop Lung:chest clear, no wheezing, rales, normal symmetric air entry, Heart exam - S1, S2 normal, no murmur, no gallop, rate regular Abdomin: soft, non-tender, without masses or organomegaly. Wound appear have healed. EXT:no erythema, induration, or nodules   Lab Results: Lab Results  Component Value Date   WBC 2.5* 01/30/2012   HGB 11.2* 01/30/2012   HCT 34.2* 01/30/2012   MCV 89.0 01/30/2012   PLT 122* 01/30/2012     Chemistry      Component Value Date/Time   NA 140 12/19/2011 1448   K 3.8 12/19/2011 1448   CL 108 12/19/2011 1448   CO2 22 12/19/2011 1448   BUN 26* 12/19/2011 1448   CREATININE 1.24* 12/19/2011 1448      Component Value Date/Time   CALCIUM 9.2 12/19/2011 1448   ALKPHOS 65 12/19/2011 1448   AST 25 12/19/2011 1448   ALT 15 12/19/2011 1448   BILITOT 0.3 12/19/2011 1448         Impression and Plan:  1. Light chain multiple myeloma.  She was Velcade IV therapy on weekly bases but has been treatment break for now. I will restart therapy with relapse likely use Kyprolis at that time. I anticipate starting in the near future likely in 02/2012 if she to get radiation therapy.  2. Pain in her hips/legs: I will do plain film x rays and anticipate the need for radiation therapy if her disease is worse in these areas.  3. Anemia, multifactorial. Resolved at this time. 4. History of deep vein thrombosis.  She was on Coumadin. I do not she is taking this anymore. No new clots noted.  5. Abdominal wound infection.  The infection seems to have resolved. 6. Hypertension seems to be under relatively good control.  7. Follow up in in 4-6 weeks.  8. Port management: We will flush her port today and every 6 weeks.      [facility], MD 8/8/20133:07 PM

## 2012-01-30 NOTE — Addendum Note (Signed)
Addended by: Sherre Poot on: 01/30/2012 03:21 PM   Modules accepted: Orders

## 2012-01-31 ENCOUNTER — Other Ambulatory Visit: Payer: Self-pay | Admitting: Oncology

## 2012-01-31 ENCOUNTER — Other Ambulatory Visit: Payer: Self-pay | Admitting: *Deleted

## 2012-01-31 DIAGNOSIS — C9000 Multiple myeloma not having achieved remission: Secondary | ICD-10-CM

## 2012-01-31 MED ORDER — PROCHLORPERAZINE MALEATE 10 MG PO TABS: 10.0000 mg | ORAL_TABLET | Freq: Four times a day (QID) | ORAL | Status: DC | PRN

## 2012-01-31 NOTE — Progress Notes (Signed)
The results of Xray and labs discussed with the patient. She is clearly progressing at this time and she needs salvage therapy.  I will start her on Carfilzomib starting 8/13 + Acyclovir prophylaxis. She is agreeable to proceed.  Onc Tx schedule done and sent to the schedulers.

## 2012-01-31 NOTE — Telephone Encounter (Signed)
Patient called reporting she is nauseated and needs a refill on prochlorperazine.  Refill called in to [facility] on [address].  Reports she doesn't eat until the evening hours and her sister will bring her soup and crackers.  Encouraged to try to drink fluids to avoid dehydration.

## 2012-02-03 ENCOUNTER — Telehealth: Payer: Self-pay | Admitting: *Deleted

## 2012-02-03 ENCOUNTER — Telehealth: Payer: Self-pay | Admitting: Oncology

## 2012-02-03 NOTE — Telephone Encounter (Signed)
Added additional appts per 8/9 pof. S/w pt today re appts for 8/13 and 8/14. Pt will get new schedule when she comes in 8/13.

## 2012-02-03 NOTE — Telephone Encounter (Signed)
Patient called asking what Tuesday and Wednesday she is to start chemotherapy.  Noted orders on 01-31-2012 for her to start tomorrow.  [name] says she can't arrive for any appointments before 2:30 pm.  Schedulers [place] and [place] notified.

## 2012-02-04 ENCOUNTER — Other Ambulatory Visit: Payer: Self-pay | Admitting: *Deleted

## 2012-02-04 ENCOUNTER — Other Ambulatory Visit (HOSPITAL_BASED_OUTPATIENT_CLINIC_OR_DEPARTMENT_OTHER): Payer: Medicaid Other | Admitting: Lab

## 2012-02-04 ENCOUNTER — Ambulatory Visit (HOSPITAL_BASED_OUTPATIENT_CLINIC_OR_DEPARTMENT_OTHER): Payer: Medicaid Other

## 2012-02-04 ENCOUNTER — Other Ambulatory Visit: Payer: Self-pay | Admitting: Oncology

## 2012-02-04 DIAGNOSIS — C9000 Multiple myeloma not having achieved remission: Secondary | ICD-10-CM

## 2012-02-04 DIAGNOSIS — Z5112 Encounter for antineoplastic immunotherapy: Secondary | ICD-10-CM

## 2012-02-04 LAB — COMPREHENSIVE METABOLIC PANEL
AST: 20 U/L (ref 0–37)
Albumin: 3.9 g/dL (ref 3.5–5.2)
Alkaline Phosphatase: 56 U/L (ref 39–117)
BUN: 32 mg/dL — ABNORMAL HIGH (ref 6–23)
Creatinine, Ser: 1.55 mg/dL — ABNORMAL HIGH (ref 0.50–1.10)
Potassium: 4.5 mEq/L (ref 3.5–5.3)

## 2012-02-04 LAB — CBC WITH DIFFERENTIAL/PLATELET
BASO%: 0.4 % (ref 0.0–2.0)
EOS%: 0 % (ref 0.0–7.0)
HCT: 32.2 % — ABNORMAL LOW (ref 34.8–46.6)
MCH: 28.3 pg (ref 25.1–34.0)
MCHC: 33.5 g/dL (ref 31.5–36.0)
NEUT%: 56.2 % (ref 38.4–76.8)
RBC: 3.81 10*6/uL (ref 3.70–5.45)
lymph#: 0.8 10*3/uL — ABNORMAL LOW (ref 0.9–3.3)
nRBC: 0 % (ref 0–0)

## 2012-02-04 MED ORDER — SODIUM CHLORIDE 0.9 % IV SOLN: Freq: Once | INTRAVENOUS | Status: DC

## 2012-02-04 MED ORDER — SODIUM CHLORIDE 0.9 % IV SOLN
Freq: Once | INTRAVENOUS | Status: AC
  Administered 2012-02-04: 15:00:00 via INTRAVENOUS

## 2012-02-04 MED ORDER — ONDANSETRON 8 MG/50ML IVPB (CHCC)
8.0000 mg | Freq: Once | INTRAVENOUS | Status: AC
Start: 2012-02-04 — End: 2012-02-04
  Administered 2012-02-04: 8 mg via INTRAVENOUS

## 2012-02-04 MED ORDER — SODIUM CHLORIDE 0.9 % IJ SOLN
10.0000 mL | INTRAMUSCULAR | Status: DC | PRN
  Administered 2012-02-04: 10 mL
  Filled 2012-02-04: qty 10

## 2012-02-04 MED ORDER — DEXTROSE 5 % IV SOLN
20.0000 mg/m2 | Freq: Once | INTRAVENOUS | Status: AC
  Administered 2012-02-04: 46 mg via INTRAVENOUS
  Filled 2012-02-04: qty 23

## 2012-02-04 MED ORDER — DEXAMETHASONE SODIUM PHOSPHATE 10 MG/ML IJ SOLN
10.0000 mg | Freq: Once | INTRAMUSCULAR | Status: AC
  Administered 2012-02-04: 10 mg via INTRAVENOUS

## 2012-02-04 MED ORDER — HEPARIN SOD (PORK) LOCK FLUSH 100 UNIT/ML IV SOLN
500.0000 [IU] | Freq: Once | INTRAVENOUS | Status: AC | PRN
  Administered 2012-02-04: 500 [IU]
  Filled 2012-02-04: qty 5

## 2012-02-04 MED ORDER — OXYCODONE HCL 5 MG PO TABS: 5.0000 mg | ORAL_TABLET | ORAL | Status: DC | PRN

## 2012-02-04 NOTE — Patient Instructions (Addendum)
[facility] Discharge Instructions for Patients Receiving Chemotherapy  Today you received the following chemotherapy agents kryprolis  To help prevent nausea and vomiting after your treatment, we encourage you to take your nausea medication  and take it as often as prescribed   If you develop nausea and vomiting that is not controlled by your nausea medication, call the clinic. If it is after clinic hours your family physician or the after hours number for the clinic or go to the Emergency Department.   BELOW ARE SYMPTOMS THAT SHOULD BE REPORTED IMMEDIATELY:  *FEVER GREATER THAN 100.5 F  *CHILLS WITH OR WITHOUT FEVER  NAUSEA AND VOMITING THAT IS NOT CONTROLLED WITH YOUR NAUSEA MEDICATION  *UNUSUAL SHORTNESS OF BREATH  *UNUSUAL BRUISING OR BLEEDING  TENDERNESS IN MOUTH AND THROAT WITH OR WITHOUT PRESENCE OF ULCERS  *URINARY PROBLEMS  *BOWEL PROBLEMS  UNUSUAL RASH Items with * indicate a potential emergency and should be followed up as soon as possible.  One of the nurses will contact you 24 hours after your treatment. Please let the nurse know about any problems that you may have experienced. Feel free to call the clinic you have any questions or concerns. The clinic phone number is [phone].   I have been informed and understand all the instructions given to me. I know to contact the clinic, my physician, or go to the Emergency Department if any problems should occur. I do not have any questions at this time, but understand that I may call the clinic during office hours   should I have any questions or need assistance in obtaining follow up care.    __________________________________________  _____________  __________ Signature of Patient or Authorized Representative            Date                   Time    __________________________________________ Nurse's Signature    

## 2012-02-05 ENCOUNTER — Ambulatory Visit (HOSPITAL_BASED_OUTPATIENT_CLINIC_OR_DEPARTMENT_OTHER): Payer: Medicaid Other

## 2012-02-05 VITALS — BP 98/65 | HR 78 | Temp 97.9°F

## 2012-02-05 DIAGNOSIS — Z5112 Encounter for antineoplastic immunotherapy: Secondary | ICD-10-CM

## 2012-02-05 DIAGNOSIS — C9000 Multiple myeloma not having achieved remission: Secondary | ICD-10-CM

## 2012-02-05 MED ORDER — DEXTROSE 5 % IV SOLN
20.0000 mg/m2 | Freq: Once | INTRAVENOUS | Status: AC
  Administered 2012-02-05: 46 mg via INTRAVENOUS
  Filled 2012-02-05: qty 23

## 2012-02-05 MED ORDER — SODIUM CHLORIDE 0.9 % IJ SOLN
10.0000 mL | INTRAMUSCULAR | Status: DC | PRN
  Administered 2012-02-05: 10 mL
  Filled 2012-02-05: qty 10

## 2012-02-05 MED ORDER — HEPARIN SOD (PORK) LOCK FLUSH 100 UNIT/ML IV SOLN
500.0000 [IU] | Freq: Once | INTRAVENOUS | Status: AC | PRN
  Administered 2012-02-05: 500 [IU]
  Filled 2012-02-05: qty 5

## 2012-02-05 MED ORDER — ONDANSETRON 8 MG/50ML IVPB (CHCC)
8.0000 mg | Freq: Once | INTRAVENOUS | Status: AC
  Administered 2012-02-05: 8 mg via INTRAVENOUS

## 2012-02-05 MED ORDER — SODIUM CHLORIDE 0.9 % IV SOLN
Freq: Once | INTRAVENOUS | Status: AC
  Administered 2012-02-05: 15:00:00 via INTRAVENOUS

## 2012-02-05 MED ORDER — DEXAMETHASONE SODIUM PHOSPHATE 10 MG/ML IJ SOLN
10.0000 mg | Freq: Once | INTRAMUSCULAR | Status: AC
  Administered 2012-02-05: 10 mg via INTRAVENOUS

## 2012-02-05 NOTE — Patient Instructions (Addendum)
[facility] Discharge Instructions for Patients Receiving Chemotherapy  Today you received the following chemotherapy agents [place]  To help prevent nausea and vomiting after your treatment, we encourage you to take your nausea medication as directed.   If you develop nausea and vomiting that is not controlled by your nausea medication, call the clinic. If it is after clinic hours your family physician or the after hours number for the clinic or go to the Emergency Department.   BELOW ARE SYMPTOMS THAT SHOULD BE REPORTED IMMEDIATELY:  *FEVER GREATER THAN 100.5 F  *CHILLS WITH OR WITHOUT FEVER  NAUSEA AND VOMITING THAT IS NOT CONTROLLED WITH YOUR NAUSEA MEDICATION  *UNUSUAL SHORTNESS OF BREATH  *UNUSUAL BRUISING OR BLEEDING  TENDERNESS IN MOUTH AND THROAT WITH OR WITHOUT PRESENCE OF ULCERS  *URINARY PROBLEMS  *BOWEL PROBLEMS  UNUSUAL RASH Items with * indicate a potential emergency and should be followed up as soon as possible.  One of the nurses will contact you 24 hours after your treatment. Please let the nurse know about any problems that you may have experienced. Feel free to call the clinic you have any questions or concerns. The clinic phone number is [phone].   I have been informed and understand all the instructions given to me. I know to contact the clinic, my physician, or go to the Emergency Department if any problems should occur. I do not have any questions at this time, but understand that I may call the clinic during office hours   should I have any questions or need assistance in obtaining follow up care.    __________________________________________  _____________  __________ Signature of Patient or Authorized Representative            Date                   Time    __________________________________________ Nurse's Signature    

## 2012-02-11 ENCOUNTER — Ambulatory Visit (HOSPITAL_BASED_OUTPATIENT_CLINIC_OR_DEPARTMENT_OTHER): Payer: Medicaid Other

## 2012-02-11 ENCOUNTER — Other Ambulatory Visit (HOSPITAL_BASED_OUTPATIENT_CLINIC_OR_DEPARTMENT_OTHER): Payer: Medicaid Other | Admitting: Lab

## 2012-02-11 ENCOUNTER — Ambulatory Visit (HOSPITAL_BASED_OUTPATIENT_CLINIC_OR_DEPARTMENT_OTHER): Payer: Medicaid Other | Admitting: Oncology

## 2012-02-11 VITALS — BP 98/63 | HR 71 | Temp 97.3°F | Resp 22 | Ht 68.0 in | Wt 243.8 lb

## 2012-02-11 DIAGNOSIS — C9000 Multiple myeloma not having achieved remission: Secondary | ICD-10-CM

## 2012-02-11 DIAGNOSIS — D649 Anemia, unspecified: Secondary | ICD-10-CM

## 2012-02-11 DIAGNOSIS — Z5112 Encounter for antineoplastic immunotherapy: Secondary | ICD-10-CM

## 2012-02-11 DIAGNOSIS — D6959 Other secondary thrombocytopenia: Secondary | ICD-10-CM

## 2012-02-11 DIAGNOSIS — M79609 Pain in unspecified limb: Secondary | ICD-10-CM

## 2012-02-11 LAB — CBC WITH DIFFERENTIAL/PLATELET
BASO%: 0 % (ref 0.0–2.0)
EOS%: 0 % (ref 0.0–7.0)
HCT: 33.6 % — ABNORMAL LOW (ref 34.8–46.6)
LYMPH%: 30 % (ref 14.0–49.7)
MCH: 28.3 pg (ref 25.1–34.0)
MCHC: 33.9 g/dL (ref 31.5–36.0)
NEUT%: 58.2 % (ref 38.4–76.8)
lymph#: 1 10*3/uL (ref 0.9–3.3)

## 2012-02-11 LAB — COMPREHENSIVE METABOLIC PANEL
AST: 15 U/L (ref 0–37)
Alkaline Phosphatase: 57 U/L (ref 39–117)
BUN: 62 mg/dL — ABNORMAL HIGH (ref 6–23)
Creatinine, Ser: 2.4 mg/dL — ABNORMAL HIGH (ref 0.50–1.10)
Potassium: 5.2 mEq/L (ref 3.5–5.3)

## 2012-02-11 MED ORDER — OXYCODONE-ACETAMINOPHEN 5-325 MG PO TABS
1.0000 | ORAL_TABLET | Freq: Once | ORAL | Status: AC
  Administered 2012-02-11: 1 via ORAL

## 2012-02-11 MED ORDER — DEXTROSE 5 % IV SOLN
20.0000 mg/m2 | Freq: Once | INTRAVENOUS | Status: AC
  Administered 2012-02-11: 46 mg via INTRAVENOUS
  Filled 2012-02-11: qty 23

## 2012-02-11 MED ORDER — DEXAMETHASONE SODIUM PHOSPHATE 10 MG/ML IJ SOLN
10.0000 mg | Freq: Once | INTRAMUSCULAR | Status: AC
  Administered 2012-02-11: 10 mg via INTRAVENOUS

## 2012-02-11 MED ORDER — ONDANSETRON 8 MG/50ML IVPB (CHCC)
8.0000 mg | Freq: Once | INTRAVENOUS | Status: AC
  Administered 2012-02-11: 8 mg via INTRAVENOUS

## 2012-02-11 MED ORDER — SODIUM CHLORIDE 0.9 % IV SOLN
Freq: Once | INTRAVENOUS | Status: AC
  Administered 2012-02-11: 16:00:00 via INTRAVENOUS

## 2012-02-11 NOTE — Patient Instructions (Signed)
[facility] Discharge Instructions for Patients Receiving Chemotherapy  Today you received the following chemotherapy agents kyprolis  To help prevent nausea and vomiting after your treatment, we encourage you to take your nausea medication if needed Begin taking it at 10pm and take it as often as prescribed for the next 48 hours if needed.   If you develop nausea and vomiting that is not controlled by your nausea medication, call the clinic. If it is after clinic hours your family physician or the after hours number for the clinic or go to the Emergency Department.   BELOW ARE SYMPTOMS THAT SHOULD BE REPORTED IMMEDIATELY:  *FEVER GREATER THAN 100.5 F  *CHILLS WITH OR WITHOUT FEVER  NAUSEA AND VOMITING THAT IS NOT CONTROLLED WITH YOUR NAUSEA MEDICATION  *UNUSUAL SHORTNESS OF BREATH  *UNUSUAL BRUISING OR BLEEDING  TENDERNESS IN MOUTH AND THROAT WITH OR WITHOUT PRESENCE OF ULCERS  *URINARY PROBLEMS  *BOWEL PROBLEMS  UNUSUAL RASH Items with * indicate a potential emergency and should be followed up as soon as possible.  One of the nurses will contact you 24 hours after your treatment. Please let the nurse know about any problems that you may have experienced. Feel free to call the clinic you have any questions or concerns. The clinic phone number is [phone].   I have been informed and understand all the instructions given to me. I know to contact the clinic, my physician, or go to the Emergency Department if any problems should occur. I do not have any questions at this time, but understand that I may call the clinic during office hours   should I have any questions or need assistance in obtaining follow up care.    __________________________________________  _____________  __________ Signature of Patient or Authorized Representative            Date                   Time    __________________________________________ Nurse's Signature

## 2012-02-12 ENCOUNTER — Ambulatory Visit (HOSPITAL_BASED_OUTPATIENT_CLINIC_OR_DEPARTMENT_OTHER): Payer: Medicaid Other

## 2012-02-12 ENCOUNTER — Encounter: Payer: Self-pay | Admitting: Oncology

## 2012-02-12 DIAGNOSIS — C9000 Multiple myeloma not having achieved remission: Secondary | ICD-10-CM

## 2012-02-12 DIAGNOSIS — Z5112 Encounter for antineoplastic immunotherapy: Secondary | ICD-10-CM

## 2012-02-12 MED ORDER — DEXAMETHASONE SODIUM PHOSPHATE 10 MG/ML IJ SOLN
10.0000 mg | Freq: Once | INTRAMUSCULAR | Status: AC
Start: 2012-02-12 — End: 2012-02-12
  Administered 2012-02-12: 10 mg via INTRAVENOUS

## 2012-02-12 MED ORDER — HEPARIN SOD (PORK) LOCK FLUSH 100 UNIT/ML IV SOLN
500.0000 [IU] | Freq: Once | INTRAVENOUS | Status: AC | PRN
  Administered 2012-02-12: 500 [IU]
  Filled 2012-02-12: qty 5

## 2012-02-12 MED ORDER — SODIUM CHLORIDE 0.9 % IV SOLN
Freq: Once | INTRAVENOUS | Status: AC
  Administered 2012-02-12: 17:00:00 via INTRAVENOUS

## 2012-02-12 MED ORDER — SODIUM CHLORIDE 0.9 % IJ SOLN
10.0000 mL | INTRAMUSCULAR | Status: DC | PRN
  Administered 2012-02-12: 10 mL
  Filled 2012-02-12: qty 10

## 2012-02-12 MED ORDER — SODIUM CHLORIDE 0.9 % IV SOLN
Freq: Once | INTRAVENOUS | Status: AC
  Administered 2012-02-12: 16:00:00 via INTRAVENOUS

## 2012-02-12 MED ORDER — DEXTROSE 5 % IV SOLN
20.0000 mg/m2 | Freq: Once | INTRAVENOUS | Status: AC
  Administered 2012-02-12: 46 mg via INTRAVENOUS
  Filled 2012-02-12: qty 23

## 2012-02-12 MED ORDER — OXYCODONE-ACETAMINOPHEN 5-325 MG PO TABS
1.0000 | ORAL_TABLET | Freq: Once | ORAL | Status: AC
  Administered 2012-02-12: 1 via ORAL

## 2012-02-12 MED ORDER — ONDANSETRON 8 MG/50ML IVPB (CHCC)
8.0000 mg | Freq: Once | INTRAVENOUS | Status: AC
  Administered 2012-02-12: 8 mg via INTRAVENOUS

## 2012-02-12 MED ORDER — SODIUM CHLORIDE 0.9 % IV SOLN: Freq: Once | INTRAVENOUS | Status: DC

## 2012-02-12 NOTE — Progress Notes (Signed)
Hematology and Oncology Follow Up Visit  [name] [id number] [birth year] 61 y.o. 02/12/2012 8:31 AM  CC: [facility]   Principle Diagnosis:  This is a 62 year old female with the following issues:   1. 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  She had a free kappa light chain around 1500. 2. Diagnosis of deep vein thrombosis in July 2011.   Prior Therapy:    1. Initially treated with melphalan and prednisone.  She had a partial response with free light chains down as low as 19, M spike down to 0.52 g/dL.  2. Patient treated with Revlimid at 25 mg 3 weeks on and 1 week off between November 2009 until November 2010.  She had a partial response, light chains down to 33 and M spike was not detected.   3. She was treated on maintenance Revlimid 15 mg to maintain her partial response. 4. She was on Zometa intermittently, on hold know. 5. Currently anticoagulated with Coumadin 7.5 mg since July 2011. 6.     She is owas Velcade 1.3 mg/sq m for a total of 2.9 mg weekly, which started on 02/21/2011 with weekly dexamethasone .  Treatment has been on hold due to recurrent hospitalization for wound infection. Treatment restarted in in 06/2011 till 09/2011.  Current therapy: She was started on Kyprolis 02/04/12.  Interim History:  [name] presents today for an office followup visit.  This is a pleasant 61 year old female with a few comorbid conditions as outlined above. She tolerated Velcade well without complications in the past. But is feeling better since Velcade was stopped. She is reporting pain in both hips radiating to the back of her thighs. No neurological symptoms. She is able to ambulate and actually pain is relieved by stretching and ambulation. She takes Oxycodone which helps.  She does not report any obvious bleeding such as epistaxis, gum bleeding, hemoptysis, melena, hematochezia or  hematuria.  She does have some intermittent swelling in her right lower extremity.  She does not report any headaches, visual changes, any dysphagia, odynophagia, any nausea, vomiting, diarrhea, constipation, chest pain, shortness of breath, productive cough, abdominal pain, abdominal swelling, lower extremity paresthesias, bowel or bladder incontinence.  She denies any fevers, chills or night sweats.     Medications: I have reviewed the patient's current medications.  Allergies:  Allergies  Allergen Reactions  . Ibuprofen Other (See Comments)    Patient does not remember.   . Morphine And Related Other (See Comments)    Patient does not remember reaction, believes it caused raised marks on skin.    Past Medical History, Surgical history, Social history, and Family History were reviewed and updated.  Review of Systems: Constitutional:  Negative for fever, chills, night sweats, anorexia, weight loss, pain. Cardiovascular: no chest pain or dyspnea on exertion Respiratory: no cough, shortness of breath, or wheezing Neurological: no TIA or stroke symptoms Dermatological: negative ENT: negative Skin: Negative. Gastrointestinal: no abdominal pain, change in bowel habits, or black or bloody stools Genito-Urinary: no dysuria, trouble voiding, or hematuria Hematological and Lymphatic: negative Breast: negative for breast lumps negative Musculoskeletal: negative Remaining ROS negative.  Physical Exam: Blood pressure 98/63, pulse 71, temperature 97.3 F (36.3 C), temperature source Oral, resp. rate 22, height 5\' 8"  (1.727 m), weight 243 lb 12.8 oz (110.587 kg). ECOG: 1 General appearance: alert Head: Normocephalic, without obvious abnormality, atraumatic Neck: no adenopathy, no carotid bruit, no  JVD, supple, symmetrical, trachea midline and thyroid not enlarged, symmetric, no tenderness/mass/nodules Lymph nodes: Cervical, supraclavicular, and axillary nodes normal. Heart:regular rate  and rhythm, S1, S2 normal, no murmur, click, rub or gallop Lung:chest clear, no wheezing, rales, normal symmetric air entry, Heart exam - S1, S2 normal, no murmur, no gallop, rate regular Abdomen: soft, non-tender, without masses or organomegaly. Wound appear have healed. EXT:no erythema, induration, or nodules   Lab Results: Lab Results  Component Value Date   WBC 3.3* 02/11/2012   HGB 11.4* 02/11/2012   HCT 33.6* 02/11/2012   MCV 83.4 02/11/2012   PLT 88* 02/11/2012     Chemistry      Component Value Date/Time   NA 137 02/11/2012 1445   K 5.2 02/11/2012 1445   CL 109 02/11/2012 1445   CO2 20 02/11/2012 1445   BUN 62* 02/11/2012 1445   CREATININE 2.40* 02/11/2012 1445      Component Value Date/Time   CALCIUM 9.4 02/11/2012 1445   ALKPHOS 57 02/11/2012 1445   AST 15 02/11/2012 1445   ALT 12 02/11/2012 1445   BILITOT 0.5 02/11/2012 1445      Impression and Plan:  1. Light chain multiple myeloma. She is on Kyprolis and tolerating this well overall. She has mild thrombocytopenia. Discussed with Dr Truett Perna in Dr Alver Fisher absence and will proceed with Kyprolis this week. 2. Pain in her hips/legs: Plain film x-rays show multiple myeloma. Continue Percocet. 3. Anemia, multifactorial. No active bleeding. No transfusion is indicated at this time. 4. History of deep vein thrombosis. Previously anticoagulated with Coumadin. No new clots noted.  5. Hypertension seems to be under relatively good control.  6. Thrombocytopenia. Due to chemotherapy. Will proceed with Kyprolis this week. No active bleeding and no transfusion is indicated. 7. Follow up. On 9/3 as scheduled.    [place], [place] 8/21/20138:31 AM

## 2012-02-12 NOTE — Patient Instructions (Signed)
Patient aware of next appointment; discharged home via taxi with no complaints.

## 2012-02-14 ENCOUNTER — Emergency Department (HOSPITAL_COMMUNITY)
Admission: EM | Admit: 2012-02-14 | Discharge: 2012-02-14 | Disposition: A | Discharge status: Home / Self Care | Payer: Medicaid Other | Attending: Emergency Medicine | Admitting: Emergency Medicine

## 2012-02-14 ENCOUNTER — Encounter (HOSPITAL_COMMUNITY): Payer: Self-pay

## 2012-02-14 ENCOUNTER — Telehealth: Payer: Self-pay | Admitting: *Deleted

## 2012-02-14 DIAGNOSIS — Z888 Allergy status to other drugs, medicaments and biological substances status: Secondary | ICD-10-CM | POA: Insufficient documentation

## 2012-02-14 DIAGNOSIS — Z7901 Long term (current) use of anticoagulants: Secondary | ICD-10-CM | POA: Insufficient documentation

## 2012-02-14 DIAGNOSIS — F329 Major depressive disorder, single episode, unspecified: Secondary | ICD-10-CM | POA: Insufficient documentation

## 2012-02-14 DIAGNOSIS — Z711 Person with feared health complaint in whom no diagnosis is made: Secondary | ICD-10-CM | POA: Insufficient documentation

## 2012-02-14 DIAGNOSIS — F411 Generalized anxiety disorder: Secondary | ICD-10-CM | POA: Insufficient documentation

## 2012-02-14 DIAGNOSIS — Z87891 Personal history of nicotine dependence: Secondary | ICD-10-CM | POA: Insufficient documentation

## 2012-02-14 DIAGNOSIS — E119 Type 2 diabetes mellitus without complications: Secondary | ICD-10-CM | POA: Insufficient documentation

## 2012-02-14 DIAGNOSIS — Z885 Allergy status to narcotic agent status: Secondary | ICD-10-CM | POA: Insufficient documentation

## 2012-02-14 DIAGNOSIS — Z8249 Family history of ischemic heart disease and other diseases of the circulatory system: Secondary | ICD-10-CM | POA: Insufficient documentation

## 2012-02-14 DIAGNOSIS — C9000 Multiple myeloma not having achieved remission: Secondary | ICD-10-CM | POA: Insufficient documentation

## 2012-02-14 DIAGNOSIS — F3289 Other specified depressive episodes: Secondary | ICD-10-CM | POA: Insufficient documentation

## 2012-02-14 DIAGNOSIS — K219 Gastro-esophageal reflux disease without esophagitis: Secondary | ICD-10-CM | POA: Insufficient documentation

## 2012-02-14 DIAGNOSIS — IMO0001 Reserved for inherently not codable concepts without codable children: Secondary | ICD-10-CM

## 2012-02-14 DIAGNOSIS — I10 Essential (primary) hypertension: Secondary | ICD-10-CM | POA: Insufficient documentation

## 2012-02-14 NOTE — ED Provider Notes (Signed)
History     CSN: [id number]  Arrival date & time 02/14/12  1306   First MD Initiated Contact with Patient 02/14/12 1511      Chief Complaint  Patient presents with  . Hypotension    (Consider location/radiation/quality/duration/timing/severity/associated sxs/prior treatment) HPI [name] 61 y.o. female   The chief complaint is:  Chief Complaint  Patient presents with  . Hypotension     [name] is a 61 year old female with a history of diabetes and multiple myeloma. She is followed by [name] in oncology. She states that she has a scale and a blood pressure cuff which he takes her blood pressure every day at home. For the past 3 days her blood pressure cuff has been showing that she has hypertension. She called Dr. Aaron Mose office today and they advised her to calm emergency department to be evaluated. EMS reports that all vital signs were normal. They report blood pressure of 124/78 lying and 130/84 standing. The patient denies any recent history of fevers chills sweats, nausea, vomiting, diarrhea, chest pain, shortness of breath, abdominal pain. He denies any racing or skipping heart. She denies any lightheadedness or feeling of presyncope. She denies any change in her stool colors including blood or melena. She states that she is feeling fine and was not really sure why she had to come in today. She also states that her home blood pressure cuff is very small and she has to use her hand to keep it on her arm because it will not fit all the way around.     Past Medical History  Diagnosis Date  . Small bowel obstruction   . Diabetes mellitus   . Hypertension   . Hyperlipidemia   . Chronic renal insufficiency   . Multiple myeloma   . Abdominal pain   . Ventral hernia   . Congestive heart failure   . GERD (gastroesophageal reflux disease)   . Renal insufficiency   . Obesity   . Depression   . Anxiety   . Abscess     groin    Past Surgical History    Procedure Date  . Hernia repair     ventral hernia  . Exploratory laparotomy   . Oophorectomy   . Breast surgery     reduction  . Panniculectomy   . Incise and drain abcess     groin    Family History  Problem Relation Age of Onset  . Heart disease Mother     History  Substance Use Topics  . Smoking status: Former Games developer  . Smokeless tobacco: Never Used  . Alcohol Use: No    OB History    Grav Para Term Preterm Abortions TAB SAB Ect Mult Living                  Review of Systems  Constitutional: Negative.   Respiratory: Negative for chest tightness and shortness of breath.   Cardiovascular: Negative for chest pain and palpitations.  Gastrointestinal: Negative for nausea, vomiting, abdominal pain, diarrhea and blood in stool.  Genitourinary: Negative for dysuria.  Neurological: Negative for dizziness, syncope and light-headedness.    Allergies  Ibuprofen and Morphine and related  Home Medications   Current Outpatient Rx  Name Route Sig Dispense Refill  . AMLODIPINE BESYLATE 5 MG PO TABS Oral Take 5 mg by mouth daily.      . ASPIRIN 81 MG PO TABS Oral Take 81 mg by mouth daily.      Marland Kitchen  ATENOLOL 25 MG PO TABS Oral Take 25 mg by mouth daily.      . BUPROPION HCL ER (XL) 150 MG PO TB24 Oral Take 150 mg by mouth daily.      . FUROSEMIDE 40 MG PO TABS Oral Take 40 mg by mouth 2 (two) times daily.      Marland Kitchen LIDOCAINE-PRILOCAINE 2.5-2.5 % EX CREA Topical Apply topically as needed. 30 g 1  . LORATADINE 10 MG PO TABS Oral Take 10 mg by mouth daily.      Marland Kitchen ONE-DAILY MULTI VITAMINS PO TABS Oral Take 1 tablet by mouth daily.      Marland Kitchen ONDANSETRON HCL 8 MG PO TABS Oral Take by mouth every 8 (eight) hours as needed.      . OXYBUTYNIN CHLORIDE 5 MG PO TABS Oral Take 5 mg by mouth daily.      . OXYCODONE HCL 5 MG PO TABS Oral Take 1 tablet (5 mg total) by mouth every 4 (four) hours as needed. 90 tablet 0  . POTASSIUM CHLORIDE 20 MEQ PO PACK Oral Take 20 mEq by mouth daily.      Marland Kitchen  PRAVASTATIN SODIUM 40 MG PO TABS Oral Take 40 mg by mouth daily.      Marland Kitchen PROCHLORPERAZINE MALEATE 10 MG PO TABS Oral Take 1 tablet (10 mg total) by mouth every 6 (six) hours as needed. 30 tablet 3  . RANITIDINE HCL 150 MG PO TABS Oral Take 150 mg by mouth 2 (two) times daily.      Marland Kitchen TEMAZEPAM 7.5 MG PO CAPS Oral Take 15 mg by mouth at bedtime as needed.      . WARFARIN SODIUM 5 MG PO TABS Oral Take 1 tablet (5 mg total) by mouth daily. Or as directed by physician 30 tablet 3  . ZOLPIDEM TARTRATE 10 MG PO TABS Oral Take 10 mg by mouth at bedtime as needed. For sleep      BP 132/57  Pulse 72  Temp 97.7 F (36.5 C) (Oral)  SpO2 90%  Physical Exam  Nursing note and vitals reviewed. Constitutional: She is oriented to person, place, and time. She appears well-developed and well-nourished. No distress.       Obese female.  HENT:  Head: Normocephalic and atraumatic.  Eyes: Conjunctivae are normal. Right eye exhibits no discharge. Left eye exhibits no discharge. No scleral icterus.  Neck: Neck supple. No JVD present. No tracheal deviation present.  Cardiovascular: Normal rate, regular rhythm, normal heart sounds and intact distal pulses.        No peripheral edema appear  Pulmonary/Chest: Breath sounds normal. No respiratory distress. She has no wheezes. She has no rales.  Abdominal: Soft. There is no tenderness.  Musculoskeletal: She exhibits no edema.  Lymphadenopathy:    She has no cervical adenopathy.  Neurological: She is alert and oriented to person, place, and time.  Skin: Skin is warm and dry. She is not diaphoretic.    ED Course  Procedures (including critical care time)  Labs Reviewed - No data to display No results found.  Patient's vital signs stable here. Orthostatic vital signs are also normal. I suspect her blood pressure readings are low because of her ill fitting cough. I have discussed this with the patient and advised her to contact her physician about getting a cuff  that fits her properly. Patient expresses understanding. Call her oncologist today about this matter. I advised her to follow up with [name]. All questions answered fully. Patient  is in agreement with plan. No diagnosis found.    MDM  It is safe to discharge.Discussed reasons to seek immediate care. Patient expresses understanding and agrees with plan.         Arthor Captain, PA-C 02/14/12 1634

## 2012-02-14 NOTE — Telephone Encounter (Signed)
Verbal order rceived and read back from Ms. Curcio NP for patient not to take /p medicines until evaluated.  PCP is Dr. Clelia Croft with [facility] so patient needs to go to ER for evaluation.  Called Patient with these instructions.  Says [facility] is closer to her.  Also instructed to take medicine list, let staff know she is dizzy with low b/p readings, former [facility] patient and needs evaluation.  Also instructed to contact [facility] by August 30 th for help finding a Primary Health Care Provider.

## 2012-02-14 NOTE — ED Notes (Signed)
Bed:WLPT2<BR> Expected date:<BR> Expected time:<BR> Means of arrival:<BR> Comments:<BR> ems

## 2012-02-14 NOTE — Telephone Encounter (Signed)
Patient called from [facility] ER at 2:15 pm reporting she has been waiting for three hours, has not been able to eat and wants to come to [facility] to wait to be seen.  Informed her she needs to stay to be evaluated.   [name] gave cell number [phone] to reach her. Called [name] with [facility] and learned patient was picked up by them after hospitalization with CHF and fluid overload.  Patient is in denial with this diagnosis.  They do tele-monitoring and instructed her to see MD about her diastolic b/p readings.  Their records are as follows: 02-05-2012 B/P = 115/47 02-06-2012 B/P = 120/59 02-07-2012 B/P = 107/42 02-10-2012 B/P = 114/64 02-11-2012 B/P = 105/65 02-12-2012 B/P = 113/56 02-13-2012 B/P = 120/58 02-14-2012 B/P = 114/42 Patient's diastolic is lower and needs evaluation.  She denies having congestive heart failure stating her body just holds onto fluid. Called ER and spoke with Charge nurse with this information.  Patient may not eat or drink and they will work her up today.  Called patient's cell number three times and mobile customer not available.

## 2012-02-14 NOTE — ED Notes (Signed)
Per EMS pt received a call from her PCP to come to the ED d/t low b/p, pts b/p 124/78 lying and 130/84 standing; pt in no distress, ambulatory with her walker

## 2012-02-14 NOTE — Telephone Encounter (Signed)
[name] called reporting the [facility] staff that helps with home care called her stating her b/p has been lower and her weight is dropping.  Suggest she [facility] the [facility].  Reports she is dizzy, has been taking anti-diarrheal medicine because of diarrhea all the time and needs to know how to proceed.  Noted lower b/p per August EPIC records.  Patient takes Norvasc, tenormin and lasix.  Has not taken meds yet today and I asked that she not take them until I notify providers.  Reports meds were started when she was in the hospital.  The [facility] staff was Thurston Hole and her number is [phone].  Patient can be reached at home.

## 2012-02-15 NOTE — ED Provider Notes (Signed)
Medical screening examination/treatment/procedure(s) were performed by non-physician practitioner and as supervising physician I was immediately available for consultation/collaboration.   Lyanne Co, MD 02/15/12 [phone]

## 2012-02-18 ENCOUNTER — Ambulatory Visit (HOSPITAL_BASED_OUTPATIENT_CLINIC_OR_DEPARTMENT_OTHER): Payer: Medicaid Other

## 2012-02-18 ENCOUNTER — Other Ambulatory Visit (HOSPITAL_BASED_OUTPATIENT_CLINIC_OR_DEPARTMENT_OTHER): Payer: Medicaid Other | Admitting: Lab

## 2012-02-18 VITALS — BP 111/68 | HR 64 | Temp 98.1°F | Resp 17

## 2012-02-18 DIAGNOSIS — Z5112 Encounter for antineoplastic immunotherapy: Secondary | ICD-10-CM

## 2012-02-18 DIAGNOSIS — C9000 Multiple myeloma not having achieved remission: Secondary | ICD-10-CM

## 2012-02-18 LAB — CBC WITH DIFFERENTIAL/PLATELET
Basophils Absolute: 0 10*3/uL (ref 0.0–0.1)
EOS%: 0 % (ref 0.0–7.0)
HCT: 32.8 % — ABNORMAL LOW (ref 34.8–46.6)
HGB: 11.2 g/dL — ABNORMAL LOW (ref 11.6–15.9)
MCH: 28.4 pg (ref 25.1–34.0)
MCV: 83 fL (ref 79.5–101.0)
MONO%: 10.7 % (ref 0.0–14.0)
NEUT%: 58 % (ref 38.4–76.8)

## 2012-02-18 LAB — COMPREHENSIVE METABOLIC PANEL (CC13)
ALT: 16 U/L (ref 0–55)
Albumin: 3.6 g/dL (ref 3.5–5.0)
BUN: 43 mg/dL — ABNORMAL HIGH (ref 7.0–26.0)
CO2: 17 mEq/L — ABNORMAL LOW (ref 22–29)
Glucose: 72 mg/dl (ref 70–99)
Potassium: 5.3 mEq/L — ABNORMAL HIGH (ref 3.5–5.1)
Sodium: 138 mEq/L (ref 136–145)

## 2012-02-18 MED ORDER — SODIUM CHLORIDE 0.9 % IJ SOLN
10.0000 mL | INTRAMUSCULAR | Status: DC | PRN
  Administered 2012-02-18: 10 mL
  Filled 2012-02-18: qty 10

## 2012-02-18 MED ORDER — DEXTROSE 5 % IV SOLN
20.0000 mg/m2 | Freq: Once | INTRAVENOUS | Status: AC
  Administered 2012-02-18: 46 mg via INTRAVENOUS
  Filled 2012-02-18: qty 23

## 2012-02-18 MED ORDER — HEPARIN SOD (PORK) LOCK FLUSH 100 UNIT/ML IV SOLN
500.0000 [IU] | Freq: Once | INTRAVENOUS | Status: AC | PRN
  Administered 2012-02-18: 500 [IU]
  Filled 2012-02-18: qty 5

## 2012-02-18 MED ORDER — SODIUM CHLORIDE 0.9 % IV SOLN
Freq: Once | INTRAVENOUS | Status: AC
  Administered 2012-02-18: 16:00:00 via INTRAVENOUS

## 2012-02-18 MED ORDER — ONDANSETRON 8 MG/50ML IVPB (CHCC)
8.0000 mg | Freq: Once | INTRAVENOUS | Status: AC
  Administered 2012-02-18: 8 mg via INTRAVENOUS

## 2012-02-18 MED ORDER — SODIUM CHLORIDE 0.9 % IV SOLN: Freq: Once | INTRAVENOUS | Status: DC

## 2012-02-18 MED ORDER — DEXAMETHASONE SODIUM PHOSPHATE 10 MG/ML IJ SOLN
10.0000 mg | Freq: Once | INTRAMUSCULAR | Status: AC
  Administered 2012-02-18: 10 mg via INTRAVENOUS

## 2012-02-18 NOTE — Patient Instructions (Signed)
[facility] Discharge Instructions for Patients Receiving Chemotherapy  Today you received the following chemotherapy agent Kyprolis.  To help prevent nausea and vomiting after your treatment, we encourage you to take your nausea medication. Begin taking your nausea medication as often as prescribed for by Dr. Clelia Croft.    If you develop nausea and vomiting that is not controlled by your nausea medication, call the clinic. If it is after clinic hours your family physician or the after hours number for the clinic or go to the Emergency Department.   BELOW ARE SYMPTOMS THAT SHOULD BE REPORTED IMMEDIATELY:  *FEVER GREATER THAN 100.5 F  *CHILLS WITH OR WITHOUT FEVER  NAUSEA AND VOMITING THAT IS NOT CONTROLLED WITH YOUR NAUSEA MEDICATION  *UNUSUAL SHORTNESS OF BREATH  *UNUSUAL BRUISING OR BLEEDING  TENDERNESS IN MOUTH AND THROAT WITH OR WITHOUT PRESENCE OF ULCERS  *URINARY PROBLEMS  *BOWEL PROBLEMS  UNUSUAL RASH Items with * indicate a potential emergency and should be followed up as soon as possible.  One of the nurses will contact you 24 hours after your treatment. Please let the nurse know about any problems that you may have experienced. Feel free to call the clinic you have any questions or concerns. The clinic phone number is [phone].   I have been informed and understand all the instructions given to me. I know to contact the clinic, my physician, or go to the Emergency Department if any problems should occur. I do not have any questions at this time, but understand that I may call the clinic during office hours   should I have any questions or need assistance in obtaining follow up care.    __________________________________________  _____________  __________ Signature of Patient or Authorized Representative            Date                   Time    __________________________________________ Nurse's Signature

## 2012-02-19 ENCOUNTER — Ambulatory Visit (HOSPITAL_BASED_OUTPATIENT_CLINIC_OR_DEPARTMENT_OTHER): Payer: Medicaid Other

## 2012-02-19 VITALS — BP 111/72 | HR 69 | Temp 98.6°F | Resp 18

## 2012-02-19 DIAGNOSIS — C9000 Multiple myeloma not having achieved remission: Secondary | ICD-10-CM

## 2012-02-19 DIAGNOSIS — Z5112 Encounter for antineoplastic immunotherapy: Secondary | ICD-10-CM

## 2012-02-19 MED ORDER — SODIUM CHLORIDE 0.9 % IV SOLN
Freq: Once | INTRAVENOUS | Status: AC
  Administered 2012-02-19: 17:00:00 via INTRAVENOUS

## 2012-02-19 MED ORDER — OXYCODONE-ACETAMINOPHEN 5-325 MG PO TABS
1.0000 | ORAL_TABLET | Freq: Once | ORAL | Status: AC
  Administered 2012-02-19: 1 via ORAL

## 2012-02-19 MED ORDER — SODIUM CHLORIDE 0.9 % IJ SOLN
10.0000 mL | INTRAMUSCULAR | Status: DC | PRN
  Administered 2012-02-19: 10 mL
  Filled 2012-02-19: qty 10

## 2012-02-19 MED ORDER — OXYCODONE-ACETAMINOPHEN 5-325 MG PO TABS: 1.0000 | ORAL_TABLET | ORAL | Status: AC | PRN

## 2012-02-19 MED ORDER — DEXTROSE 5 % IV SOLN
20.0000 mg/m2 | Freq: Once | INTRAVENOUS | Status: AC
  Administered 2012-02-19: 46 mg via INTRAVENOUS
  Filled 2012-02-19: qty 23

## 2012-02-19 MED ORDER — HEPARIN SOD (PORK) LOCK FLUSH 100 UNIT/ML IV SOLN
500.0000 [IU] | Freq: Once | INTRAVENOUS | Status: AC | PRN
  Administered 2012-02-19: 500 [IU]
  Filled 2012-02-19: qty 5

## 2012-02-19 MED ORDER — DEXAMETHASONE SODIUM PHOSPHATE 10 MG/ML IJ SOLN
10.0000 mg | Freq: Once | INTRAMUSCULAR | Status: AC
  Administered 2012-02-19: 10 mg via INTRAVENOUS

## 2012-02-19 MED ORDER — ONDANSETRON 8 MG/50ML IVPB (CHCC)
8.0000 mg | Freq: Once | INTRAVENOUS | Status: AC
  Administered 2012-02-19: 8 mg via INTRAVENOUS

## 2012-02-19 MED ORDER — SODIUM CHLORIDE 0.9 % IV SOLN
Freq: Once | INTRAVENOUS | Status: AC
  Administered 2012-02-19: 16:00:00 via INTRAVENOUS

## 2012-02-19 NOTE — Patient Instructions (Signed)
Patient aware of next appointment; discharged home with no complaints. 

## 2012-02-25 ENCOUNTER — Other Ambulatory Visit (HOSPITAL_BASED_OUTPATIENT_CLINIC_OR_DEPARTMENT_OTHER): Payer: Medicaid Other | Admitting: Lab

## 2012-02-25 ENCOUNTER — Ambulatory Visit (HOSPITAL_BASED_OUTPATIENT_CLINIC_OR_DEPARTMENT_OTHER): Payer: Medicaid Other | Admitting: Oncology

## 2012-02-25 ENCOUNTER — Telehealth: Payer: Self-pay | Admitting: Oncology

## 2012-02-25 VITALS — BP 119/72 | HR 73 | Temp 97.2°F | Resp 20 | Ht 68.0 in | Wt 245.6 lb

## 2012-02-25 DIAGNOSIS — M79609 Pain in unspecified limb: Secondary | ICD-10-CM

## 2012-02-25 DIAGNOSIS — C9000 Multiple myeloma not having achieved remission: Secondary | ICD-10-CM

## 2012-02-25 DIAGNOSIS — Z86718 Personal history of other venous thrombosis and embolism: Secondary | ICD-10-CM

## 2012-02-25 DIAGNOSIS — D649 Anemia, unspecified: Secondary | ICD-10-CM

## 2012-02-25 LAB — CBC WITH DIFFERENTIAL/PLATELET
Eosinophils Absolute: 0 10*3/uL (ref 0.0–0.5)
MONO#: 0.4 10*3/uL (ref 0.1–0.9)
NEUT#: 1.3 10*3/uL — ABNORMAL LOW (ref 1.5–6.5)
RBC: 3.79 10*6/uL (ref 3.70–5.45)
RDW: 19.1 % — ABNORMAL HIGH (ref 11.2–14.5)
WBC: 2.2 10*3/uL — ABNORMAL LOW (ref 3.9–10.3)
lymph#: 0.5 10*3/uL — ABNORMAL LOW (ref 0.9–3.3)
nRBC: 1 % — ABNORMAL HIGH (ref 0–0)

## 2012-02-25 LAB — COMPREHENSIVE METABOLIC PANEL (CC13)
ALT: 14 U/L (ref 0–55)
CO2: 17 mEq/L — ABNORMAL LOW (ref 22–29)
Calcium: 8.7 mg/dL (ref 8.4–10.4)
Chloride: 117 mEq/L — ABNORMAL HIGH (ref 98–107)
Potassium: 4.9 mEq/L (ref 3.5–5.1)
Sodium: 142 mEq/L (ref 136–145)
Total Bilirubin: 0.7 mg/dL (ref 0.20–1.20)
Total Protein: 5.9 g/dL — ABNORMAL LOW (ref 6.4–8.3)

## 2012-02-25 NOTE — Progress Notes (Signed)
Hematology and Oncology Follow Up Visit  [name] [id number] [birth year] 61 y.o. 02/25/2012 3:47 PM  CC: [facility]   Principle Diagnosis:  This is a 61 year old female with the following issues:   1. 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  She had a free kappa light chain around 1500. 2. Diagnosis of deep vein thrombosis in July 2011.   Prior Therapy:    1. Initially treated with melphalan and prednisone.  She had a partial response with free light chains down as low as 19, M spike down to 0.52 g/dL.  2. Patient treated with Revlimid at 25 mg 3 weeks on and 1 week off between November 2009 until November 2010.  She had a partial response, light chains down to 33 and M spike was not detected.   3. She was treated on maintenance Revlimid 15 mg to maintain her partial response. 4. She was on Zometa intermittently, on hold know. 5. Currently anticoagulated with Coumadin 7.5 mg since July 2011. 6.     She is owas Velcade 1.3 mg/sq m for a total of 2.9 mg weekly, which started on 02/21/2011 with weekly dexamethasone .  Treatment has been on hold due to recurrent hospitalization for wound infection. Treatment restarted in in 06/2011 till 09/2011.  Current therapy: She was started on Kyprolis 02/04/12.  Interim History:  [name] presents today for an office followup visit.  This is a pleasant 61 year old female with a few comorbid conditions as outlined above. She has tolerated Kyprolis without complications at this time. She is reporting pain in both hips radiating to the back of her thighs. No neurological symptoms. She is able to ambulate and actually pain is relieved by stretching and ambulation. She takes Oxycodone which helps.  She does not report any obvious bleeding such as epistaxis, gum bleeding, hemoptysis, melena, hematochezia or hematuria.  She does have some intermittent  swelling in her right lower extremity.  She does not report any headaches, visual changes, any dysphagia, odynophagia, any nausea, vomiting, diarrhea, constipation, chest pain, shortness of breath, productive cough, abdominal pain, abdominal swelling, lower extremity paresthesias, bowel or bladder incontinence.  She denies any fevers, chills or night sweats.     Medications: I have reviewed the patient's current medications.  Allergies:  Allergies  Allergen Reactions  . Ibuprofen Other (See Comments)    Patient does not remember.   . Morphine And Related Other (See Comments)    Patient does not remember reaction, believes it caused raised marks on skin.    Past Medical History, Surgical history, Social history, and Family History were reviewed and updated.  Review of Systems: Constitutional:  Negative for fever, chills, night sweats, anorexia, weight loss, pain. Cardiovascular: no chest pain or dyspnea on exertion Respiratory: no cough, shortness of breath, or wheezing Neurological: no TIA or stroke symptoms Dermatological: negative ENT: negative Skin: Negative. Gastrointestinal: no abdominal pain, change in bowel habits, or black or bloody stools Genito-Urinary: no dysuria, trouble voiding, or hematuria Hematological and Lymphatic: negative Breast: negative for breast lumps negative Musculoskeletal: negative Remaining ROS negative.  Physical Exam: Blood pressure 119/72, pulse 73, temperature 97.2 F (36.2 C), temperature source Oral, resp. rate 20, height 5\' 8"  (1.727 m), weight 245 lb 9.6 oz (111.403 kg). ECOG: 1 General appearance: alert Head: Normocephalic, without obvious abnormality, atraumatic Neck: no adenopathy, no carotid bruit, no JVD, supple, symmetrical, trachea midline and thyroid not  enlarged, symmetric, no tenderness/mass/nodules Lymph nodes: Cervical, supraclavicular, and axillary nodes normal. Heart:regular rate and rhythm, S1, S2 normal, no murmur, click,  rub or gallop Lung:chest clear, no wheezing, rales, normal symmetric air entry, Heart exam - S1, S2 normal, no murmur, no gallop, rate regular Abdomen: soft, non-tender, without masses or organomegaly. Wound appear have healed. EXT:no erythema, induration, or nodules   Lab Results: Lab Results  Component Value Date   WBC 2.2* 02/25/2012   HGB 10.7* 02/25/2012   HCT 31.7* 02/25/2012   MCV 83.6 02/25/2012   PLT 115* 02/25/2012     Chemistry      Component Value Date/Time   NA 138 02/18/2012 1440   NA 137 02/11/2012 1445   K 5.3* 02/18/2012 1440   K 5.2 02/11/2012 1445   CL 114* 02/18/2012 1440   CL 109 02/11/2012 1445   CO2 17* 02/18/2012 1440   CO2 20 02/11/2012 1445   BUN 43.0* 02/18/2012 1440   BUN 62* 02/11/2012 1445   CREATININE 1.7* 02/18/2012 1440   CREATININE 2.40* 02/11/2012 1445      Component Value Date/Time   CALCIUM 8.8 02/18/2012 1440   CALCIUM 9.4 02/11/2012 1445   ALKPHOS 74 02/18/2012 1440   ALKPHOS 57 02/11/2012 1445   AST 15 02/18/2012 1440   AST 15 02/11/2012 1445   ALT 16 02/18/2012 1440   ALT 12 02/11/2012 1445   BILITOT 0.60 02/18/2012 1440   BILITOT 0.5 02/11/2012 1445      Impression and Plan:  1. Light chain multiple myeloma. She is on Kyprolis and tolerating this well overall. She has mild thrombocytopenia which has resolved. The plan is to proceed with Kyprolis cycle 2 next week. 2. Pain in her hips/legs: Plain film x-rays show multiple myeloma. Continue Percocet. 3. Anemia, multifactorial. No active bleeding. No transfusion is indicated at this time. 4. History of deep vein thrombosis. Previously anticoagulated with Coumadin. No new clots noted.  5. Hypertension seems to be under relatively good control.  6. Thrombocytopenia. Due to chemotherapy. Will proceed with Kyprolis this week. No active bleeding and no transfusion is indicated. 7. Follow up. On 9/10 as scheduled fo chemotherapy and 9/17 with mid level.     [name] 9/3/20133:47 PM

## 2012-02-25 NOTE — Telephone Encounter (Signed)
appts made and printed for pt aom pt aware that tx will be added by mw and we will give her a new sch on 9/10   aom

## 2012-02-26 ENCOUNTER — Telehealth: Payer: Self-pay | Admitting: *Deleted

## 2012-02-26 NOTE — Telephone Encounter (Signed)
Per staff message and POF I have scheduled appts.  JMW  

## 2012-02-27 NOTE — Progress Notes (Signed)
Faxed signed long term care FL2 form to [employer] @ [phone]

## 2012-03-03 ENCOUNTER — Ambulatory Visit (HOSPITAL_BASED_OUTPATIENT_CLINIC_OR_DEPARTMENT_OTHER): Payer: Medicaid Other

## 2012-03-03 ENCOUNTER — Other Ambulatory Visit (HOSPITAL_BASED_OUTPATIENT_CLINIC_OR_DEPARTMENT_OTHER): Payer: Medicaid Other | Admitting: Lab

## 2012-03-03 VITALS — BP 115/81 | HR 82 | Temp 98.0°F | Resp 20

## 2012-03-03 DIAGNOSIS — Z5112 Encounter for antineoplastic immunotherapy: Secondary | ICD-10-CM

## 2012-03-03 DIAGNOSIS — C9000 Multiple myeloma not having achieved remission: Secondary | ICD-10-CM

## 2012-03-03 LAB — COMPREHENSIVE METABOLIC PANEL (CC13)
ALT: 12 U/L (ref 0–55)
AST: 14 U/L (ref 5–34)
Alkaline Phosphatase: 133 U/L (ref 40–150)
CO2: 20 mEq/L — ABNORMAL LOW (ref 22–29)
Sodium: 143 mEq/L (ref 136–145)
Total Bilirubin: 0.5 mg/dL (ref 0.20–1.20)
Total Protein: 6.1 g/dL — ABNORMAL LOW (ref 6.4–8.3)

## 2012-03-03 LAB — CBC WITH DIFFERENTIAL/PLATELET
BASO%: 0.9 % (ref 0.0–2.0)
LYMPH%: 23.2 % (ref 14.0–49.7)
MCHC: 32.2 g/dL (ref 31.5–36.0)
MONO#: 0.3 10*3/uL (ref 0.1–0.9)
Platelets: 151 10*3/uL (ref 145–400)
RBC: 3.72 10*6/uL (ref 3.70–5.45)
WBC: 2.4 10*3/uL — ABNORMAL LOW (ref 3.9–10.3)

## 2012-03-03 MED ORDER — SODIUM CHLORIDE 0.9 % IJ SOLN
10.0000 mL | INTRAMUSCULAR | Status: DC | PRN
  Administered 2012-03-03: 10 mL via INTRAVENOUS
  Filled 2012-03-03: qty 10

## 2012-03-03 MED ORDER — SODIUM CHLORIDE 0.9 % IV SOLN
Freq: Once | INTRAVENOUS | Status: AC
  Administered 2012-03-03: 16:00:00 via INTRAVENOUS

## 2012-03-03 MED ORDER — DEXAMETHASONE SODIUM PHOSPHATE 10 MG/ML IJ SOLN
10.0000 mg | Freq: Once | INTRAMUSCULAR | Status: AC
  Administered 2012-03-03: 10 mg via INTRAVENOUS

## 2012-03-03 MED ORDER — HEPARIN SOD (PORK) LOCK FLUSH 100 UNIT/ML IV SOLN
500.0000 [IU] | Freq: Once | INTRAVENOUS | Status: AC
  Administered 2012-03-03: 500 [IU] via INTRAVENOUS
  Filled 2012-03-03: qty 5

## 2012-03-03 MED ORDER — DEXTROSE 5 % IV SOLN
20.0000 mg/m2 | Freq: Once | INTRAVENOUS | Status: AC
  Administered 2012-03-03: 46 mg via INTRAVENOUS
  Filled 2012-03-03: qty 23

## 2012-03-03 MED ORDER — ONDANSETRON 8 MG/50ML IVPB (CHCC)
8.0000 mg | Freq: Once | INTRAVENOUS | Status: AC
  Administered 2012-03-03: 8 mg via INTRAVENOUS

## 2012-03-03 NOTE — Patient Instructions (Signed)
[facility] Discharge Instructions for Patients Receiving Chemotherapy  Today you received the following chemotherapy agents  Kyprolis  To help prevent nausea and vomiting after your treatment, we encourage you to take your nausea medication   .   If you develop nausea and vomiting that is not controlled by your nausea medication, call the clinic. If it is after clinic hours your family physician or the after hours number for the clinic or go to the Emergency Department.   BELOW ARE SYMPTOMS THAT SHOULD BE REPORTED IMMEDIATELY:  *FEVER GREATER THAN 100.5 F  *CHILLS WITH OR WITHOUT FEVER  NAUSEA AND VOMITING THAT IS NOT CONTROLLED WITH YOUR NAUSEA MEDICATION  *UNUSUAL SHORTNESS OF BREATH  *UNUSUAL BRUISING OR BLEEDING  TENDERNESS IN MOUTH AND THROAT WITH OR WITHOUT PRESENCE OF ULCERS  *URINARY PROBLEMS  *BOWEL PROBLEMS  UNUSUAL RASH Items with * indicate a potential emergency and should be followed up as soon as possible.     . The clinic phone number is [phone].            __________________________________________ Nurse's Signature

## 2012-03-04 ENCOUNTER — Ambulatory Visit (HOSPITAL_BASED_OUTPATIENT_CLINIC_OR_DEPARTMENT_OTHER): Payer: Medicaid Other

## 2012-03-04 VITALS — BP 92/65 | HR 71 | Temp 99.2°F | Resp 20

## 2012-03-04 DIAGNOSIS — Z5112 Encounter for antineoplastic immunotherapy: Secondary | ICD-10-CM

## 2012-03-04 DIAGNOSIS — C9000 Multiple myeloma not having achieved remission: Secondary | ICD-10-CM

## 2012-03-04 MED ORDER — DEXTROSE 5 % IV SOLN
20.0000 mg/m2 | Freq: Once | INTRAVENOUS | Status: AC
  Administered 2012-03-04: 46 mg via INTRAVENOUS
  Filled 2012-03-04: qty 23

## 2012-03-04 MED ORDER — ONDANSETRON 8 MG/50ML IVPB (CHCC)
8.0000 mg | Freq: Once | INTRAVENOUS | Status: AC
  Administered 2012-03-04: 8 mg via INTRAVENOUS

## 2012-03-04 MED ORDER — SODIUM CHLORIDE 0.9 % IV SOLN
Freq: Once | INTRAVENOUS | Status: AC
  Administered 2012-03-04: 16:00:00 via INTRAVENOUS

## 2012-03-04 MED ORDER — DEXAMETHASONE SODIUM PHOSPHATE 10 MG/ML IJ SOLN
10.0000 mg | Freq: Once | INTRAMUSCULAR | Status: AC
  Administered 2012-03-04: 10 mg via INTRAVENOUS

## 2012-03-04 MED ORDER — HEPARIN SOD (PORK) LOCK FLUSH 100 UNIT/ML IV SOLN
500.0000 [IU] | Freq: Once | INTRAVENOUS | Status: AC | PRN
  Administered 2012-03-04: 500 [IU]
  Filled 2012-03-04: qty 5

## 2012-03-04 MED ORDER — SODIUM CHLORIDE 0.9 % IJ SOLN
10.0000 mL | INTRAMUSCULAR | Status: DC | PRN
  Administered 2012-03-04: 10 mL
  Filled 2012-03-04: qty 10

## 2012-03-04 NOTE — Patient Instructions (Addendum)
[facility] Discharge Instructions for Patients Receiving Chemotherapy  Today you received the following chemotherapy agents kryprolis  To help prevent nausea and vomiting after your treatment, we encourage you to take your nausea medication  and take it as often as prescribed   If you develop nausea and vomiting that is not controlled by your nausea medication, call the clinic. If it is after clinic hours your family physician or the after hours number for the clinic or go to the Emergency Department.   BELOW ARE SYMPTOMS THAT SHOULD BE REPORTED IMMEDIATELY:  *FEVER GREATER THAN 100.5 F  *CHILLS WITH OR WITHOUT FEVER  NAUSEA AND VOMITING THAT IS NOT CONTROLLED WITH YOUR NAUSEA MEDICATION  *UNUSUAL SHORTNESS OF BREATH  *UNUSUAL BRUISING OR BLEEDING  TENDERNESS IN MOUTH AND THROAT WITH OR WITHOUT PRESENCE OF ULCERS  *URINARY PROBLEMS  *BOWEL PROBLEMS  UNUSUAL RASH Items with * indicate a potential emergency and should be followed up as soon as possible.  One of the nurses will contact you 24 hours after your treatment. Please let the nurse know about any problems that you may have experienced. Feel free to call the clinic you have any questions or concerns. The clinic phone number is [phone].   I have been informed and understand all the instructions given to me. I know to contact the clinic, my physician, or go to the Emergency Department if any problems should occur. I do not have any questions at this time, but understand that I may call the clinic during office hours   should I have any questions or need assistance in obtaining follow up care.    __________________________________________  _____________  __________ Signature of Patient or Authorized Representative            Date                   Time    __________________________________________ Nurse's Signature    

## 2012-03-05 ENCOUNTER — Telehealth: Payer: Self-pay | Admitting: *Deleted

## 2012-03-05 NOTE — Telephone Encounter (Signed)
Patient called with c/o hip pain.  Patient not taking Oxycodone as often as needed.  Re-confirmed that she can take 1 oxycodone every four hours.  If that does not manage the pain, encouraged patient to call this office back.  Also, patient experiencing some indigestion.  Recommended the regular use of omeprazole after treatments, as she associated this indigestion with treatment.  Omeprazole is part of patient's current medications per patient.

## 2012-03-10 ENCOUNTER — Other Ambulatory Visit: Payer: Medicaid Other | Admitting: Lab

## 2012-03-10 ENCOUNTER — Encounter: Payer: Self-pay | Admitting: Oncology

## 2012-03-10 ENCOUNTER — Ambulatory Visit (HOSPITAL_BASED_OUTPATIENT_CLINIC_OR_DEPARTMENT_OTHER): Payer: Medicaid Other

## 2012-03-10 ENCOUNTER — Ambulatory Visit: Payer: Medicaid Other | Admitting: Oncology

## 2012-03-10 ENCOUNTER — Other Ambulatory Visit (HOSPITAL_BASED_OUTPATIENT_CLINIC_OR_DEPARTMENT_OTHER): Payer: Medicaid Other | Admitting: Lab

## 2012-03-10 ENCOUNTER — Ambulatory Visit (HOSPITAL_BASED_OUTPATIENT_CLINIC_OR_DEPARTMENT_OTHER): Payer: Medicaid Other | Admitting: Oncology

## 2012-03-10 VITALS — BP 113/81 | HR 70 | Temp 98.4°F | Resp 20 | Ht 78.0 in | Wt 249.4 lb

## 2012-03-10 DIAGNOSIS — C9000 Multiple myeloma not having achieved remission: Secondary | ICD-10-CM

## 2012-03-10 DIAGNOSIS — D6959 Other secondary thrombocytopenia: Secondary | ICD-10-CM

## 2012-03-10 DIAGNOSIS — Z86718 Personal history of other venous thrombosis and embolism: Secondary | ICD-10-CM

## 2012-03-10 DIAGNOSIS — Z5112 Encounter for antineoplastic immunotherapy: Secondary | ICD-10-CM

## 2012-03-10 DIAGNOSIS — D649 Anemia, unspecified: Secondary | ICD-10-CM

## 2012-03-10 LAB — CBC WITH DIFFERENTIAL/PLATELET
BASO%: 0 % (ref 0.0–2.0)
Eosinophils Absolute: 0 10*3/uL (ref 0.0–0.5)
HCT: 33.5 % — ABNORMAL LOW (ref 34.8–46.6)
MCHC: 32.5 g/dL (ref 31.5–36.0)
MONO#: 0.4 10*3/uL (ref 0.1–0.9)
NEUT#: 1.6 10*3/uL (ref 1.5–6.5)
NEUT%: 56.3 % (ref 38.4–76.8)
Platelets: 113 10*3/uL — ABNORMAL LOW (ref 145–400)
RBC: 3.84 10*6/uL (ref 3.70–5.45)
WBC: 2.8 10*3/uL — ABNORMAL LOW (ref 3.9–10.3)
lymph#: 0.8 10*3/uL — ABNORMAL LOW (ref 0.9–3.3)
nRBC: 1 % — ABNORMAL HIGH (ref 0–0)

## 2012-03-10 LAB — COMPREHENSIVE METABOLIC PANEL (CC13)
Alkaline Phosphatase: 143 U/L (ref 40–150)
BUN: 23 mg/dL (ref 7.0–26.0)
CO2: 23 mEq/L (ref 22–29)
Creatinine: 1.2 mg/dL — ABNORMAL HIGH (ref 0.6–1.1)
Glucose: 86 mg/dl (ref 70–99)
Total Bilirubin: 0.8 mg/dL (ref 0.20–1.20)

## 2012-03-10 MED ORDER — SODIUM CHLORIDE 0.9 % IJ SOLN
10.0000 mL | INTRAMUSCULAR | Status: DC | PRN
  Administered 2012-03-10: 10 mL
  Filled 2012-03-10: qty 10

## 2012-03-10 MED ORDER — ONDANSETRON 8 MG/50ML IVPB (CHCC)
8.0000 mg | Freq: Once | INTRAVENOUS | Status: AC
  Administered 2012-03-10: 8 mg via INTRAVENOUS

## 2012-03-10 MED ORDER — SODIUM CHLORIDE 0.9 % IV SOLN
Freq: Once | INTRAVENOUS | Status: AC
  Administered 2012-03-10: 15:00:00 via INTRAVENOUS

## 2012-03-10 MED ORDER — DEXTROSE 5 % IV SOLN
20.0000 mg/m2 | Freq: Once | INTRAVENOUS | Status: AC
  Administered 2012-03-10: 46 mg via INTRAVENOUS
  Filled 2012-03-10: qty 23

## 2012-03-10 MED ORDER — OXYCODONE HCL 5 MG PO TABS: 5.0000 mg | ORAL_TABLET | ORAL | Status: DC | PRN

## 2012-03-10 MED ORDER — PROCHLORPERAZINE MALEATE 10 MG PO TABS: 10.0000 mg | ORAL_TABLET | Freq: Four times a day (QID) | ORAL | Status: DC | PRN

## 2012-03-10 MED ORDER — ONDANSETRON HCL 8 MG PO TABS: 8.0000 mg | ORAL_TABLET | Freq: Three times a day (TID) | ORAL | Status: DC | PRN

## 2012-03-10 MED ORDER — LIDOCAINE-PRILOCAINE 2.5-2.5 % EX CREA: TOPICAL_CREAM | CUTANEOUS | Status: DC | PRN

## 2012-03-10 MED ORDER — DEXAMETHASONE SODIUM PHOSPHATE 10 MG/ML IJ SOLN
10.0000 mg | Freq: Once | INTRAMUSCULAR | Status: AC
  Administered 2012-03-10: 10 mg via INTRAVENOUS

## 2012-03-10 MED ORDER — OXYCODONE-ACETAMINOPHEN 5-325 MG PO TABS
1.0000 | ORAL_TABLET | Freq: Once | ORAL | Status: AC
  Administered 2012-03-10: 1 via ORAL

## 2012-03-10 MED ORDER — TEMAZEPAM 15 MG PO CAPS: 15.0000 mg | ORAL_CAPSULE | Freq: Every evening | ORAL | Status: DC | PRN

## 2012-03-10 MED ORDER — HEPARIN SOD (PORK) LOCK FLUSH 100 UNIT/ML IV SOLN
500.0000 [IU] | Freq: Once | INTRAVENOUS | Status: AC | PRN
  Administered 2012-03-10: 500 [IU]
  Filled 2012-03-10: qty 5

## 2012-03-10 NOTE — Patient Instructions (Signed)
[facility] Discharge Instructions for Patients Receiving Chemotherapy  Today you received the following chemotherapy agents Krypolis  To help prevent nausea and vomiting after your treatment, we encourage you to take your nausea medication as directed.   If you develop nausea and vomiting that is not controlled by your nausea medication, call the clinic. If it is after clinic hours your family physician or the after hours number for the clinic or go to the Emergency Department.   BELOW ARE SYMPTOMS THAT SHOULD BE REPORTED IMMEDIATELY:  *FEVER GREATER THAN 100.5 F  *CHILLS WITH OR WITHOUT FEVER  NAUSEA AND VOMITING THAT IS NOT CONTROLLED WITH YOUR NAUSEA MEDICATION  *UNUSUAL SHORTNESS OF BREATH  *UNUSUAL BRUISING OR BLEEDING  TENDERNESS IN MOUTH AND THROAT WITH OR WITHOUT PRESENCE OF ULCERS  *URINARY PROBLEMS  *BOWEL PROBLEMS  UNUSUAL RASH Items with * indicate a potential emergency and should be followed up as soon as possible.  One of the nurses will contact you 24 hours after your treatment. Please let the nurse know about any problems that you may have experienced. Feel free to call the clinic you have any questions or concerns. The clinic phone number is [phone].   I have been informed and understand all the instructions given to me. I know to contact the clinic, my physician, or go to the Emergency Department if any problems should occur. I do not have any questions at this time, but understand that I may call the clinic during office hours   should I have any questions or need assistance in obtaining follow up care.    __________________________________________  _____________  __________ Signature of Patient or Authorized Representative            Date                   Time    __________________________________________ Nurse's Signature    

## 2012-03-11 ENCOUNTER — Ambulatory Visit (HOSPITAL_BASED_OUTPATIENT_CLINIC_OR_DEPARTMENT_OTHER): Payer: Medicaid Other

## 2012-03-11 VITALS — BP 113/69 | HR 64 | Temp 98.3°F | Resp 20

## 2012-03-11 DIAGNOSIS — Z5112 Encounter for antineoplastic immunotherapy: Secondary | ICD-10-CM

## 2012-03-11 DIAGNOSIS — C9000 Multiple myeloma not having achieved remission: Secondary | ICD-10-CM

## 2012-03-11 MED ORDER — ONDANSETRON 8 MG/50ML IVPB (CHCC)
8.0000 mg | Freq: Once | INTRAVENOUS | Status: AC
  Administered 2012-03-11: 8 mg via INTRAVENOUS

## 2012-03-11 MED ORDER — DEXAMETHASONE SODIUM PHOSPHATE 10 MG/ML IJ SOLN
10.0000 mg | Freq: Once | INTRAMUSCULAR | Status: AC
  Administered 2012-03-11: 10 mg via INTRAVENOUS

## 2012-03-11 MED ORDER — DEXTROSE 5 % IV SOLN
20.0000 mg/m2 | Freq: Once | INTRAVENOUS | Status: AC
  Administered 2012-03-11: 46 mg via INTRAVENOUS
  Filled 2012-03-11: qty 23

## 2012-03-11 MED ORDER — SODIUM CHLORIDE 0.9 % IV SOLN
Freq: Once | INTRAVENOUS | Status: AC
  Administered 2012-03-11: 20 mL via INTRAVENOUS

## 2012-03-11 MED ORDER — SODIUM CHLORIDE 0.9 % IJ SOLN
10.0000 mL | INTRAMUSCULAR | Status: DC | PRN
  Administered 2012-03-11: 10 mL
  Filled 2012-03-11: qty 10

## 2012-03-11 MED ORDER — HEPARIN SOD (PORK) LOCK FLUSH 100 UNIT/ML IV SOLN
500.0000 [IU] | Freq: Once | INTRAVENOUS | Status: AC | PRN
  Administered 2012-03-11: 500 [IU]
  Filled 2012-03-11: qty 5

## 2012-03-11 NOTE — Progress Notes (Signed)
Hematology and Oncology Follow Up Visit  [name] [id number] 1951/06/01 61 y.o. 03/11/2012 8:28 AM  CC: [facility]   Principle Diagnosis:  This is a 61 year old female with the following issues:   1. 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  She had a free kappa light chain around 1500. 2. Diagnosis of deep vein thrombosis in July 2011.   Prior Therapy:    1. Initially treated with melphalan and prednisone.  She had a partial response with free light chains down as low as 19, M spike down to 0.52 g/dL.  2. Patient treated with Revlimid at 25 mg 3 weeks on and 1 week off between November 2009 until November 2010.  She had a partial response, light chains down to 33 and M spike was not detected.   3. She was treated on maintenance Revlimid 15 mg to maintain her partial response. 4. She was on Zometa intermittently, on hold know. 5. Currently anticoagulated with Coumadin 7.5 mg since July 2011. 6.     She is owas Velcade 1.3 mg/sq m for a total of 2.9 mg weekly, which started on 02/21/2011 with weekly dexamethasone .  Treatment has been on hold due to recurrent hospitalization for wound infection. Treatment restarted in in 06/2011 till 09/2011.  Current therapy: She was started on Kyprolis 02/04/12.  Interim History:  [name] presents today for an office followup visit.  This is a pleasant 61 year old female with a few comorbid conditions as outlined above. She has tolerated Kyprolis without complications at this time. She is reporting pain in both hips radiating to the back of her thighs. Pain is controlled with Oxycodone. Using pain meds about 3 times per day. No neurological symptoms. She is able to ambulate and actually pain is relieved by stretching and ambulation. She takes Oxycodone which helps.  She does not report any obvious bleeding such as epistaxis, gum bleeding, hemoptysis,  melena, hematochezia or hematuria.  She does have some intermittent swelling in her right lower extremity.  She does not report any headaches, visual changes, any dysphagia, odynophagia, any nausea, vomiting, diarrhea, constipation, chest pain, shortness of breath, productive cough, abdominal pain, abdominal swelling, lower extremity paresthesias, bowel or bladder incontinence.  She denies any fevers, chills or night sweats.     Medications: I have reviewed the patient's current medications.  Allergies:  Allergies  Allergen Reactions  . Ibuprofen Other (See Comments)    Patient does not remember.   . Morphine And Related Other (See Comments)    Patient does not remember reaction, believes it caused raised marks on skin.    Past Medical History, Surgical history, Social history, and Family History were reviewed and updated.  Review of Systems: Constitutional:  Negative for fever, chills, night sweats, anorexia, weight loss, pain. Cardiovascular: no chest pain or dyspnea on exertion Respiratory: no cough, shortness of breath, or wheezing Neurological: no TIA or stroke symptoms Dermatological: negative ENT: negative Skin: Negative. Gastrointestinal: no abdominal pain, change in bowel habits, or black or bloody stools Genito-Urinary: no dysuria, trouble voiding, or hematuria Hematological and Lymphatic: negative Breast: negative for breast lumps Musculoskeletal: negative Remaining ROS negative.  Physical Exam: Blood pressure 113/81, pulse 70, temperature 98.4 F (36.9 C), temperature source Oral, resp. rate 20, height 6\' 6"  (1.981 m), weight 249 lb 6.4 oz (113.127 kg). ECOG: 1 General appearance: alert Head: Normocephalic, without obvious abnormality, atraumatic Neck: no adenopathy,  no carotid bruit, no JVD, supple, symmetrical, trachea midline and thyroid not enlarged, symmetric, no tenderness/mass/nodules Lymph nodes: Cervical, supraclavicular, and axillary nodes  normal. Heart:regular rate and rhythm, S1, S2 normal, no murmur, click, rub or gallop Lung:chest clear, no wheezing, rales, normal symmetric air entry, Heart exam - S1, S2 normal, no murmur, no gallop, rate regular Abdomen: soft, non-tender, without masses or organomegaly. Wound appear have healed. EXT:no erythema, induration, or nodules   Lab Results: Lab Results  Component Value Date   WBC 2.8* 03/10/2012   HGB 10.9* 03/10/2012   HCT 33.5* 03/10/2012   MCV 87.2 03/10/2012   PLT 113* 03/10/2012     Chemistry      Component Value Date/Time   NA 142 03/10/2012 1421   NA 137 02/11/2012 1445   K 4.6 03/10/2012 1421   K 5.2 02/11/2012 1445   CL 111* 03/10/2012 1421   CL 109 02/11/2012 1445   CO2 23 03/10/2012 1421   CO2 20 02/11/2012 1445   BUN 23.0 03/10/2012 1421   BUN 62* 02/11/2012 1445   CREATININE 1.2* 03/10/2012 1421   CREATININE 2.40* 02/11/2012 1445      Component Value Date/Time   CALCIUM 8.7 03/10/2012 1421   CALCIUM 9.4 02/11/2012 1445   ALKPHOS 143 03/10/2012 1421   ALKPHOS 57 02/11/2012 1445   AST 14 03/10/2012 1421   AST 15 02/11/2012 1445   ALT 13 03/10/2012 1421   ALT 12 02/11/2012 1445   BILITOT 0.80 03/10/2012 1421   BILITOT 0.5 02/11/2012 1445      Impression and Plan:  This is a 61 year old female with the following issues:  1. Light chain multiple myeloma. She is on Kyprolis and tolerating this well overall. She has mild thrombocytopenia which has resolved. The plan is to proceed with Kyprolis cycle 2 day today and day 9 tomorrow. She will return for day 15 and 16 next week. 2. Pain in her hips/legs: Plain film x-rays show multiple myeloma. Continue Oxycodone. 3. Anemia, multifactorial. No active bleeding. No transfusion is indicated at this time. 4. History of deep vein thrombosis. Previously anticoagulated with Coumadin. No new clots noted.  5. Hypertension seems to be under relatively good control.  6. Thrombocytopenia. Due to chemotherapy. Will proceed with Kyprolis  this week. No active bleeding and no transfusion is indicated. 7. Follow up. On 10/8 as already scheduled.    [place], Belenda Cruise 9/18/20138:28 AM

## 2012-03-12 LAB — SPEP & IFE WITH QIG
Beta 2: 4.2 % (ref 3.2–6.5)
Gamma Globulin: 9.7 % — ABNORMAL LOW (ref 11.1–18.8)
IgA: 11 mg/dL — ABNORMAL LOW (ref 69–380)
IgM, Serum: 7 mg/dL — ABNORMAL LOW (ref 52–322)

## 2012-03-12 LAB — KAPPA/LAMBDA LIGHT CHAINS: Kappa free light chain: 222 mg/dL — ABNORMAL HIGH (ref 0.33–1.94)

## 2012-03-16 ENCOUNTER — Other Ambulatory Visit: Payer: Self-pay | Admitting: *Deleted

## 2012-03-16 DIAGNOSIS — C9001 Multiple myeloma in remission: Secondary | ICD-10-CM

## 2012-03-16 MED ORDER — WARFARIN SODIUM 5 MG PO TABS: 5.0000 mg | ORAL_TABLET | Freq: Every day | ORAL | Status: DC

## 2012-03-17 ENCOUNTER — Other Ambulatory Visit (HOSPITAL_BASED_OUTPATIENT_CLINIC_OR_DEPARTMENT_OTHER): Payer: Medicaid Other | Admitting: Lab

## 2012-03-17 ENCOUNTER — Ambulatory Visit (HOSPITAL_BASED_OUTPATIENT_CLINIC_OR_DEPARTMENT_OTHER): Payer: Medicaid Other

## 2012-03-17 VITALS — BP 121/76 | HR 65 | Temp 97.5°F

## 2012-03-17 DIAGNOSIS — C9000 Multiple myeloma not having achieved remission: Secondary | ICD-10-CM

## 2012-03-17 DIAGNOSIS — Z5112 Encounter for antineoplastic immunotherapy: Secondary | ICD-10-CM

## 2012-03-17 LAB — CBC WITH DIFFERENTIAL/PLATELET
Eosinophils Absolute: 0 10*3/uL (ref 0.0–0.5)
LYMPH%: 24.3 % (ref 14.0–49.7)
MCV: 87.9 fL (ref 79.5–101.0)
MONO%: 8 % (ref 0.0–14.0)
NEUT#: 2.3 10*3/uL (ref 1.5–6.5)
Platelets: 124 10*3/uL — ABNORMAL LOW (ref 145–400)
RBC: 3.87 10*6/uL (ref 3.70–5.45)
nRBC: 1 % — ABNORMAL HIGH (ref 0–0)

## 2012-03-17 LAB — COMPREHENSIVE METABOLIC PANEL (CC13)
ALT: 11 U/L (ref 0–55)
BUN: 24 mg/dL (ref 7.0–26.0)
CO2: 22 mEq/L (ref 22–29)
Creatinine: 1.2 mg/dL — ABNORMAL HIGH (ref 0.6–1.1)
Glucose: 73 mg/dl (ref 70–99)
Total Bilirubin: 0.8 mg/dL (ref 0.20–1.20)

## 2012-03-17 MED ORDER — DEXAMETHASONE SODIUM PHOSPHATE 10 MG/ML IJ SOLN
10.0000 mg | Freq: Once | INTRAMUSCULAR | Status: AC
  Administered 2012-03-17: 10 mg via INTRAVENOUS

## 2012-03-17 MED ORDER — SODIUM CHLORIDE 0.9 % IJ SOLN
10.0000 mL | INTRAMUSCULAR | Status: DC | PRN
  Administered 2012-03-17: 10 mL
  Filled 2012-03-17: qty 10

## 2012-03-17 MED ORDER — ONDANSETRON 8 MG/50ML IVPB (CHCC)
8.0000 mg | Freq: Once | INTRAVENOUS | Status: AC
  Administered 2012-03-17: 8 mg via INTRAVENOUS

## 2012-03-17 MED ORDER — DEXTROSE 5 % IV SOLN
20.0000 mg/m2 | Freq: Once | INTRAVENOUS | Status: AC
  Administered 2012-03-17: 46 mg via INTRAVENOUS
  Filled 2012-03-17: qty 23

## 2012-03-17 MED ORDER — HEPARIN SOD (PORK) LOCK FLUSH 100 UNIT/ML IV SOLN
500.0000 [IU] | Freq: Once | INTRAVENOUS | Status: AC | PRN
  Administered 2012-03-17: 500 [IU]
  Filled 2012-03-17: qty 5

## 2012-03-18 ENCOUNTER — Ambulatory Visit (HOSPITAL_BASED_OUTPATIENT_CLINIC_OR_DEPARTMENT_OTHER): Payer: Medicaid Other

## 2012-03-18 VITALS — BP 110/44 | HR 61 | Temp 98.0°F

## 2012-03-18 DIAGNOSIS — C9000 Multiple myeloma not having achieved remission: Secondary | ICD-10-CM

## 2012-03-18 DIAGNOSIS — Z5112 Encounter for antineoplastic immunotherapy: Secondary | ICD-10-CM

## 2012-03-18 MED ORDER — SODIUM CHLORIDE 0.9 % IV SOLN: Freq: Once | INTRAVENOUS | Status: DC

## 2012-03-18 MED ORDER — SODIUM CHLORIDE 0.9 % IJ SOLN
10.0000 mL | INTRAMUSCULAR | Status: DC | PRN
  Administered 2012-03-18: 10 mL
  Filled 2012-03-18: qty 10

## 2012-03-18 MED ORDER — HEPARIN SOD (PORK) LOCK FLUSH 100 UNIT/ML IV SOLN
500.0000 [IU] | Freq: Once | INTRAVENOUS | Status: AC | PRN
  Administered 2012-03-18: 500 [IU]
  Filled 2012-03-18: qty 5

## 2012-03-18 MED ORDER — DEXAMETHASONE SODIUM PHOSPHATE 10 MG/ML IJ SOLN
10.0000 mg | Freq: Once | INTRAMUSCULAR | Status: AC
  Administered 2012-03-18: 10 mg via INTRAVENOUS

## 2012-03-18 MED ORDER — ONDANSETRON 8 MG/50ML IVPB (CHCC): 8.0000 mg | Freq: Once | INTRAVENOUS | Status: DC

## 2012-03-18 MED ORDER — DEXTROSE 5 % IV SOLN
20.0000 mg/m2 | Freq: Once | INTRAVENOUS | Status: AC
  Administered 2012-03-18: 46 mg via INTRAVENOUS
  Filled 2012-03-18: qty 23

## 2012-03-18 NOTE — Patient Instructions (Signed)
[facility] Discharge Instructions for Patients Receiving Chemotherapy  Today you received the following chemotherapy agents: kyprolis  To help prevent nausea and vomiting after your treatment, we encourage you to take your nausea medication.  Take it as often as prescribed.     If you develop nausea and vomiting that is not controlled by your nausea medication, call the clinic. If it is after clinic hours your family physician or the after hours number for the clinic or go to the Emergency Department.   BELOW ARE SYMPTOMS THAT SHOULD BE REPORTED IMMEDIATELY:  *FEVER GREATER THAN 100.5 F  *CHILLS WITH OR WITHOUT FEVER  NAUSEA AND VOMITING THAT IS NOT CONTROLLED WITH YOUR NAUSEA MEDICATION  *UNUSUAL SHORTNESS OF BREATH  *UNUSUAL BRUISING OR BLEEDING  TENDERNESS IN MOUTH AND THROAT WITH OR WITHOUT PRESENCE OF ULCERS  *URINARY PROBLEMS  *BOWEL PROBLEMS  UNUSUAL RASH Items with * indicate a potential emergency and should be followed up as soon as possible.  Feel free to call the clinic you have any questions or concerns. The clinic phone number is [phone].   I have been informed and understand all the instructions given to me. I know to contact the clinic, my physician, or go to the Emergency Department if any problems should occur. I do not have any questions at this time, but understand that I may call the clinic during office hours   should I have any questions or need assistance in obtaining follow up care.    __________________________________________  _____________  __________ Signature of Patient or Authorized Representative            Date                   Time    __________________________________________ Nurse's Signature    

## 2012-03-18 NOTE — Progress Notes (Signed)
Pt did not have numbing cream on her port.  Wanted RN to provide cream.  Provided ice bag to numb site.  Pt stated did not work.  Charge RN advised could be pulled from pyxis, would be charged to patient and would take one hour to be effective.  Pt stated she did not want to be charged, that people in charge do not know the answers and to go ahead and stick her.  Port accessed with no difficulty.    Pt tolerated treatment well.  Discharged to home.

## 2012-03-31 ENCOUNTER — Telehealth: Payer: Self-pay | Admitting: Oncology

## 2012-03-31 ENCOUNTER — Ambulatory Visit (HOSPITAL_BASED_OUTPATIENT_CLINIC_OR_DEPARTMENT_OTHER): Payer: Medicaid Other | Admitting: Oncology

## 2012-03-31 ENCOUNTER — Telehealth: Payer: Self-pay | Admitting: *Deleted

## 2012-03-31 ENCOUNTER — Other Ambulatory Visit: Payer: Self-pay | Admitting: *Deleted

## 2012-03-31 ENCOUNTER — Ambulatory Visit (HOSPITAL_BASED_OUTPATIENT_CLINIC_OR_DEPARTMENT_OTHER): Payer: Medicaid Other

## 2012-03-31 ENCOUNTER — Other Ambulatory Visit (HOSPITAL_BASED_OUTPATIENT_CLINIC_OR_DEPARTMENT_OTHER): Payer: Medicaid Other | Admitting: Lab

## 2012-03-31 VITALS — BP 137/61 | HR 71 | Temp 97.2°F | Resp 18 | Ht 69.0 in

## 2012-03-31 DIAGNOSIS — R52 Pain, unspecified: Secondary | ICD-10-CM

## 2012-03-31 DIAGNOSIS — C9000 Multiple myeloma not having achieved remission: Secondary | ICD-10-CM

## 2012-03-31 DIAGNOSIS — Z86718 Personal history of other venous thrombosis and embolism: Secondary | ICD-10-CM

## 2012-03-31 DIAGNOSIS — Z5112 Encounter for antineoplastic immunotherapy: Secondary | ICD-10-CM

## 2012-03-31 DIAGNOSIS — Z23 Encounter for immunization: Secondary | ICD-10-CM

## 2012-03-31 DIAGNOSIS — D649 Anemia, unspecified: Secondary | ICD-10-CM

## 2012-03-31 LAB — CBC WITH DIFFERENTIAL/PLATELET
BASO%: 0.4 % (ref 0.0–2.0)
Basophils Absolute: 0 10*3/uL (ref 0.0–0.1)
Eosinophils Absolute: 0 10*3/uL (ref 0.0–0.5)
HCT: 34.9 % (ref 34.8–46.6)
HGB: 11.3 g/dL — ABNORMAL LOW (ref 11.6–15.9)
LYMPH%: 31.9 % (ref 14.0–49.7)
MONO#: 0.2 10*3/uL (ref 0.1–0.9)
NEUT#: 1.5 10*3/uL (ref 1.5–6.5)
NEUT%: 58.2 % (ref 38.4–76.8)
Platelets: 183 10*3/uL (ref 145–400)
WBC: 2.5 10*3/uL — ABNORMAL LOW (ref 3.9–10.3)
lymph#: 0.8 10*3/uL — ABNORMAL LOW (ref 0.9–3.3)

## 2012-03-31 LAB — COMPREHENSIVE METABOLIC PANEL (CC13)
AST: 14 U/L (ref 5–34)
Albumin: 3.6 g/dL (ref 3.5–5.0)
BUN: 19 mg/dL (ref 7.0–26.0)
CO2: 22 mEq/L (ref 22–29)
Calcium: 9 mg/dL (ref 8.4–10.4)
Chloride: 111 mEq/L — ABNORMAL HIGH (ref 98–107)
Glucose: 81 mg/dl (ref 70–99)
Potassium: 4.1 mEq/L (ref 3.5–5.1)

## 2012-03-31 MED ORDER — DEXTROSE 5 % IV SOLN
20.0000 mg/m2 | Freq: Once | INTRAVENOUS | Status: AC
  Administered 2012-03-31: 46 mg via INTRAVENOUS
  Filled 2012-03-31: qty 23

## 2012-03-31 MED ORDER — ONDANSETRON 8 MG/50ML IVPB (CHCC)
8.0000 mg | Freq: Once | INTRAVENOUS | Status: AC
  Administered 2012-03-31: 8 mg via INTRAVENOUS

## 2012-03-31 MED ORDER — SODIUM CHLORIDE 0.9 % IV SOLN
Freq: Once | INTRAVENOUS | Status: AC
  Administered 2012-03-31: 16:00:00 via INTRAVENOUS

## 2012-03-31 MED ORDER — INFLUENZA VIRUS VACC SPLIT PF IM SUSP
0.5000 mL | Freq: Once | INTRAMUSCULAR | Status: AC
  Administered 2012-03-31: 0.5 mL via INTRAMUSCULAR
  Filled 2012-03-31: qty 0.5

## 2012-03-31 MED ORDER — SODIUM CHLORIDE 0.9 % IJ SOLN
10.0000 mL | INTRAMUSCULAR | Status: DC | PRN
Start: 2012-03-31 — End: 2012-03-31
  Administered 2012-03-31: 10 mL
  Filled 2012-03-31: qty 10

## 2012-03-31 MED ORDER — HEPARIN SOD (PORK) LOCK FLUSH 100 UNIT/ML IV SOLN
500.0000 [IU] | Freq: Once | INTRAVENOUS | Status: AC | PRN
  Administered 2012-03-31: 500 [IU]
  Filled 2012-03-31: qty 5

## 2012-03-31 MED ORDER — DEXAMETHASONE SODIUM PHOSPHATE 10 MG/ML IJ SOLN
10.0000 mg | Freq: Once | INTRAMUSCULAR | Status: AC
  Administered 2012-03-31: 10 mg via INTRAVENOUS

## 2012-03-31 NOTE — Telephone Encounter (Signed)
Per staff phone message and POFI have scheduled appts. Patient given calendar.  JMW

## 2012-03-31 NOTE — Telephone Encounter (Signed)
appts made and mw to add tx and will print for her

## 2012-03-31 NOTE — Telephone Encounter (Signed)
[place] 

## 2012-03-31 NOTE — Progress Notes (Signed)
Hematology and Oncology Follow Up Visit  [name] [id number] [birth year] 61 y.o. 03/31/2012 3:06 PM      Principle Diagnosis:  This is a 61 year old female with the following issues:   1. 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  She had a free kappa light chain around 1500. 2. Diagnosis of deep vein thrombosis in July 2011.   Prior Therapy:    1. Initially treated with melphalan and prednisone.  She had a partial response with free light chains down as low as 19, M spike down to 0.52 g/dL.  2. Patient treated with Revlimid at 25 mg 3 weeks on and 1 week off between November 2009 until November 2010.  She had a partial response, light chains down to 33 and M spike was not detected.   3. She was treated on maintenance Revlimid 15 mg to maintain her partial response. 4. She was on Zometa intermittently, on hold know. 5. Currently anticoagulated with Coumadin 7.5 mg since July 2011. 6.     She is owas Velcade 1.3 mg/sq m for a total of 2.9 mg weekly, which started on 02/21/2011 with weekly dexamethasone .  Treatment has been on hold due to recurrent hospitalization for wound infection. Treatment restarted in in 06/2011 till 09/2011.  Current therapy: She was started on Kyprolis 02/04/12. She is here to start the third month of therapy.   Interim History:  [name] presents today for an office followup visit.  This is a pleasant 61 year old female with a few comorbid conditions as outlined above. She has tolerated Kyprolis without complications at this time. She is reporting pain in both hips radiating to the back of her thighs which has improved significantly.  Using pain meds about 1-2 times per day. No neurological symptoms. She is able to ambulate and actually pain is relieved by stretching and ambulation. She takes Oxycodone which helps.  She does not report any obvious bleeding such as  epistaxis, gum bleeding, hemoptysis, melena, hematochezia or hematuria.  She does have some intermittent swelling in her right lower extremity.  She does not report any headaches, visual changes, any dysphagia, odynophagia, any nausea, vomiting, diarrhea, constipation, chest pain, shortness of breath, productive cough, abdominal pain, abdominal swelling, lower extremity paresthesias, bowel or bladder incontinence.  She denies any fevers, chills or night sweats.   Medications: I have reviewed the patient's current medications.  Allergies:  Allergies  Allergen Reactions  . Ibuprofen Other (See Comments)    Patient does not remember.   . Morphine And Related Other (See Comments)    Patient does not remember reaction, believes it caused raised marks on skin.    Past Medical History, Surgical history, Social history, and Family History were reviewed and updated.  Review of Systems: Constitutional:  Negative for fever, chills, night sweats, anorexia, weight loss, pain. Cardiovascular: no chest pain or dyspnea on exertion Respiratory: no cough, shortness of breath, or wheezing Neurological: no TIA or stroke symptoms Dermatological: negative ENT: negative Skin: Negative. Gastrointestinal: no abdominal pain, change in bowel habits, or black or bloody stools Genito-Urinary: no dysuria, trouble voiding, or hematuria Hematological and Lymphatic: negative Breast: negative for breast lumps Musculoskeletal: negative Remaining ROS negative.  Physical Exam: Blood pressure 137/61, pulse 71, temperature 97.2 F (36.2 C), temperature source Oral, resp. rate 18, height 5\' 9"  (1.753 m). ECOG: 1 General appearance: alert Head: Normocephalic, without obvious abnormality, atraumatic Neck: no  adenopathy, no carotid bruit, no JVD, supple, symmetrical, trachea midline and thyroid not enlarged, symmetric, no tenderness/mass/nodules Lymph nodes: Cervical, supraclavicular, and axillary nodes  normal. Heart:regular rate and rhythm, S1, S2 normal, no murmur, click, rub or gallop Lung:chest clear, no wheezing, rales, normal symmetric air entry, Heart exam - S1, S2 normal, no murmur, no gallop, rate regular Abdomen: soft, non-tender, without masses or organomegaly. Wound appear have healed. EXT:no erythema, induration, or nodules   Lab Results: Lab Results  Component Value Date   WBC 2.5* 03/31/2012   HGB 11.3* 03/31/2012   HCT 34.9 03/31/2012   MCV 90.4 03/31/2012   PLT 183 03/31/2012     Chemistry      Component Value Date/Time   NA 143 03/17/2012 1438   NA 137 02/11/2012 1445   K 4.5 03/17/2012 1438   K 5.2 02/11/2012 1445   CL 113* 03/17/2012 1438   CL 109 02/11/2012 1445   CO2 22 03/17/2012 1438   CO2 20 02/11/2012 1445   BUN 24.0 03/17/2012 1438   BUN 62* 02/11/2012 1445   CREATININE 1.2* 03/17/2012 1438   CREATININE 2.40* 02/11/2012 1445      Component Value Date/Time   CALCIUM 8.9 03/17/2012 1438   CALCIUM 9.4 02/11/2012 1445   ALKPHOS 117 03/17/2012 1438   ALKPHOS 57 02/11/2012 1445   AST 14 03/17/2012 1438   AST 15 02/11/2012 1445   ALT 11 03/17/2012 1438   ALT 12 02/11/2012 1445   BILITOT 0.80 03/17/2012 1438   BILITOT 0.5 02/11/2012 1445    Results for [name] (MRN [id number]) as of 03/31/2012 15:10  Ref. Range 12/19/2011 14:48 03/10/2012 14:21  Kappa free light chain Latest Range: 0.33-1.94 mg/dL 4132.44 (H) 010.27 (H)    Impression and Plan:  This is a 61 year old female with the following issues:  1. Light chain multiple myeloma. She is on Kyprolis and tolerating this well overall. She has mild thrombocytopenia which has resolved. The plan is to proceed with Kyprolis cycle 3 day 1. Her light chains drops dramatically with treatment.  2. Pain in her hips/legs: Plain film x-rays show multiple myeloma. Continue Oxycodone. 3. Anemia, multifactorial. No active bleeding. No transfusion is indicated at this time. 4. History of deep vein thrombosis. Previously  anticoagulated with Coumadin. No new clots noted.  5. Hypertension seems to be under relatively good control.  6. Follow up. On 11/5 with the start of the next cycle.     [name] 10/8/20133:06 PM

## 2012-04-01 ENCOUNTER — Ambulatory Visit (HOSPITAL_BASED_OUTPATIENT_CLINIC_OR_DEPARTMENT_OTHER): Payer: Medicaid Other

## 2012-04-01 VITALS — BP 131/71 | HR 63 | Temp 98.7°F | Resp 18

## 2012-04-01 DIAGNOSIS — C9000 Multiple myeloma not having achieved remission: Secondary | ICD-10-CM

## 2012-04-01 DIAGNOSIS — Z5112 Encounter for antineoplastic immunotherapy: Secondary | ICD-10-CM

## 2012-04-01 MED ORDER — DEXAMETHASONE SODIUM PHOSPHATE 10 MG/ML IJ SOLN
10.0000 mg | Freq: Once | INTRAMUSCULAR | Status: AC
  Administered 2012-04-01: 10 mg via INTRAVENOUS

## 2012-04-01 MED ORDER — HEPARIN SOD (PORK) LOCK FLUSH 100 UNIT/ML IV SOLN
500.0000 [IU] | Freq: Once | INTRAVENOUS | Status: AC | PRN
  Administered 2012-04-01: 500 [IU]
  Filled 2012-04-01: qty 5

## 2012-04-01 MED ORDER — SODIUM CHLORIDE 0.9 % IV SOLN
Freq: Once | INTRAVENOUS | Status: AC
  Administered 2012-04-01: 15:00:00 via INTRAVENOUS

## 2012-04-01 MED ORDER — DEXTROSE 5 % IV SOLN
20.0000 mg/m2 | Freq: Once | INTRAVENOUS | Status: AC
  Administered 2012-04-01: 46 mg via INTRAVENOUS
  Filled 2012-04-01: qty 23

## 2012-04-01 MED ORDER — SODIUM CHLORIDE 0.9 % IJ SOLN
10.0000 mL | INTRAMUSCULAR | Status: DC | PRN
  Administered 2012-04-01: 10 mL
  Filled 2012-04-01: qty 10

## 2012-04-01 MED ORDER — ONDANSETRON 8 MG/50ML IVPB (CHCC)
8.0000 mg | Freq: Once | INTRAVENOUS | Status: AC
  Administered 2012-04-01: 8 mg via INTRAVENOUS

## 2012-04-01 NOTE — Patient Instructions (Addendum)
[facility] Discharge Instructions for Patients Receiving Chemotherapy  Today you received the following chemotherapy agents :  Kyprolis  To help prevent nausea and vomiting after your treatment, we encourage you to take your nausea medication as instructed by your physician, and take meds as needed for nausea.    If you develop nausea and vomiting that is not controlled by your nausea medication, call the clinic. If it is after clinic hours your family physician or the after hours number for the clinic or go to the Emergency Department.   BELOW ARE SYMPTOMS THAT SHOULD BE REPORTED IMMEDIATELY:  *FEVER GREATER THAN 100.5 F  *CHILLS WITH OR WITHOUT FEVER  NAUSEA AND VOMITING THAT IS NOT CONTROLLED WITH YOUR NAUSEA MEDICATION  *UNUSUAL SHORTNESS OF BREATH  *UNUSUAL BRUISING OR BLEEDING  TENDERNESS IN MOUTH AND THROAT WITH OR WITHOUT PRESENCE OF ULCERS  *URINARY PROBLEMS  *BOWEL PROBLEMS  UNUSUAL RASH Items with * indicate a potential emergency and should be followed up as soon as possible.  One of the nurses will contact you 24 hours after your treatment. Please let the nurse know about any problems that you may have experienced. Feel free to call the clinic you have any questions or concerns. The clinic phone number is [phone].   I have been informed and understand all the instructions given to me. I know to contact the clinic, my physician, or go to the Emergency Department if any problems should occur. I do not have any questions at this time, but understand that I may call the clinic during office hours   should I have any questions or need assistance in obtaining follow up care.    __________________________________________  _____________  __________ Signature of Patient or Authorized Representative            Date                   Time    __________________________________________ Nurse's Signature

## 2012-04-03 LAB — SPEP & IFE WITH QIG
Alpha-1-Globulin: 6.5 % — ABNORMAL HIGH (ref 2.9–4.9)
Beta 2: 3.7 % (ref 3.2–6.5)
Gamma Globulin: 9.2 % — ABNORMAL LOW (ref 11.1–18.8)
IgG (Immunoglobin G), Serum: 544 mg/dL — ABNORMAL LOW (ref 690–1700)
IgM, Serum: 7 mg/dL — ABNORMAL LOW (ref 52–322)
M-Spike, %: 0.44 g/dL

## 2012-04-07 ENCOUNTER — Ambulatory Visit (HOSPITAL_BASED_OUTPATIENT_CLINIC_OR_DEPARTMENT_OTHER): Payer: Medicaid Other

## 2012-04-07 ENCOUNTER — Other Ambulatory Visit (HOSPITAL_BASED_OUTPATIENT_CLINIC_OR_DEPARTMENT_OTHER): Payer: Medicaid Other | Admitting: Lab

## 2012-04-07 VITALS — BP 122/69 | HR 70 | Temp 97.8°F | Resp 20

## 2012-04-07 DIAGNOSIS — Z5112 Encounter for antineoplastic immunotherapy: Secondary | ICD-10-CM

## 2012-04-07 DIAGNOSIS — C9000 Multiple myeloma not having achieved remission: Secondary | ICD-10-CM

## 2012-04-07 LAB — CBC WITH DIFFERENTIAL/PLATELET
BASO%: 0.4 % (ref 0.0–2.0)
Eosinophils Absolute: 0 10*3/uL (ref 0.0–0.5)
MCHC: 31.9 g/dL (ref 31.5–36.0)
MONO#: 0.3 10*3/uL (ref 0.1–0.9)
NEUT#: 1.5 10*3/uL (ref 1.5–6.5)
Platelets: 135 10*3/uL — ABNORMAL LOW (ref 145–400)
RBC: 4.09 10*6/uL (ref 3.70–5.45)
WBC: 2.6 10*3/uL — ABNORMAL LOW (ref 3.9–10.3)
lymph#: 0.8 10*3/uL — ABNORMAL LOW (ref 0.9–3.3)
nRBC: 1 % — ABNORMAL HIGH (ref 0–0)

## 2012-04-07 LAB — COMPREHENSIVE METABOLIC PANEL (CC13)
ALT: 13 U/L (ref 0–55)
AST: 13 U/L (ref 5–34)
Albumin: 3.8 g/dL (ref 3.5–5.0)
CO2: 23 mEq/L (ref 22–29)
Calcium: 9 mg/dL (ref 8.4–10.4)
Chloride: 113 mEq/L — ABNORMAL HIGH (ref 98–107)
Creatinine: 1 mg/dL (ref 0.6–1.1)
Potassium: 4.1 mEq/L (ref 3.5–5.1)
Sodium: 144 mEq/L (ref 136–145)
Total Protein: 6 g/dL — ABNORMAL LOW (ref 6.4–8.3)

## 2012-04-07 LAB — TECHNOLOGIST REVIEW

## 2012-04-07 MED ORDER — HEPARIN SOD (PORK) LOCK FLUSH 100 UNIT/ML IV SOLN
500.0000 [IU] | Freq: Once | INTRAVENOUS | Status: AC | PRN
  Administered 2012-04-07: 500 [IU]
  Filled 2012-04-07: qty 5

## 2012-04-07 MED ORDER — DEXAMETHASONE SODIUM PHOSPHATE 10 MG/ML IJ SOLN
10.0000 mg | Freq: Once | INTRAMUSCULAR | Status: AC
  Administered 2012-04-07: 10 mg via INTRAVENOUS

## 2012-04-07 MED ORDER — SODIUM CHLORIDE 0.9 % IJ SOLN
10.0000 mL | INTRAMUSCULAR | Status: DC | PRN
  Administered 2012-04-07: 10 mL
  Filled 2012-04-07: qty 10

## 2012-04-07 MED ORDER — DEXTROSE 5 % IV SOLN
20.0000 mg/m2 | Freq: Once | INTRAVENOUS | Status: AC
  Administered 2012-04-07: 46 mg via INTRAVENOUS
  Filled 2012-04-07: qty 23

## 2012-04-07 MED ORDER — SODIUM CHLORIDE 0.9 % IV SOLN
Freq: Once | INTRAVENOUS | Status: AC
  Administered 2012-04-07: 15:00:00 via INTRAVENOUS

## 2012-04-07 MED ORDER — ONDANSETRON 8 MG/50ML IVPB (CHCC)
8.0000 mg | Freq: Once | INTRAVENOUS | Status: AC
  Administered 2012-04-07: 8 mg via INTRAVENOUS

## 2012-04-07 NOTE — Patient Instructions (Addendum)
[facility] Discharge Instructions for Patients Receiving Chemotherapy  Today you received the following chemotherapy agents Kyprolis To help prevent nausea and vomiting after your treatment, we encourage you to take your nausea medication as prescribed.  If you develop nausea and vomiting that is not controlled by your nausea medication, call the clinic. If it is after clinic hours your family physician or the after hours number for the clinic or go to the Emergency Department.   BELOW ARE SYMPTOMS THAT SHOULD BE REPORTED IMMEDIATELY:  *FEVER GREATER THAN 100.5 F  *CHILLS WITH OR WITHOUT FEVER  NAUSEA AND VOMITING THAT IS NOT CONTROLLED WITH YOUR NAUSEA MEDICATION  *UNUSUAL SHORTNESS OF BREATH  *UNUSUAL BRUISING OR BLEEDING  TENDERNESS IN MOUTH AND THROAT WITH OR WITHOUT PRESENCE OF ULCERS  *URINARY PROBLEMS  *BOWEL PROBLEMS  UNUSUAL RASH Items with * indicate a potential emergency and should be followed up as soon as possible.  One of the nurses will contact you 24 hours after your treatment. Please let the nurse know about any problems that you may have experienced. Feel free to call the clinic you have any questions or concerns. The clinic phone number is [phone].   I have been informed and understand all the instructions given to me. I know to contact the clinic, my physician, or go to the Emergency Department if any problems should occur. I do not have any questions at this time, but understand that I may call the clinic during office hours   should I have any questions or need assistance in obtaining follow up care.    __________________________________________  _____________  __________ Signature of Patient or Authorized Representative            Date                   Time    __________________________________________ Nurse's Signature    

## 2012-04-08 ENCOUNTER — Ambulatory Visit (HOSPITAL_BASED_OUTPATIENT_CLINIC_OR_DEPARTMENT_OTHER): Payer: Medicaid Other

## 2012-04-08 VITALS — BP 114/74 | HR 66 | Resp 20

## 2012-04-08 DIAGNOSIS — Z5112 Encounter for antineoplastic immunotherapy: Secondary | ICD-10-CM

## 2012-04-08 DIAGNOSIS — C9000 Multiple myeloma not having achieved remission: Secondary | ICD-10-CM

## 2012-04-08 MED ORDER — SODIUM CHLORIDE 0.9 % IV SOLN
Freq: Once | INTRAVENOUS | Status: AC
  Administered 2012-04-08: 15:00:00 via INTRAVENOUS

## 2012-04-08 MED ORDER — ONDANSETRON 8 MG/50ML IVPB (CHCC)
8.0000 mg | Freq: Once | INTRAVENOUS | Status: AC
  Administered 2012-04-08: 8 mg via INTRAVENOUS

## 2012-04-08 MED ORDER — HEPARIN SOD (PORK) LOCK FLUSH 100 UNIT/ML IV SOLN
500.0000 [IU] | Freq: Once | INTRAVENOUS | Status: AC | PRN
  Administered 2012-04-08: 500 [IU]
  Filled 2012-04-08: qty 5

## 2012-04-08 MED ORDER — DEXAMETHASONE SODIUM PHOSPHATE 10 MG/ML IJ SOLN
10.0000 mg | Freq: Once | INTRAMUSCULAR | Status: AC
  Administered 2012-04-08: 10 mg via INTRAVENOUS

## 2012-04-08 MED ORDER — SODIUM CHLORIDE 0.9 % IJ SOLN
10.0000 mL | INTRAMUSCULAR | Status: DC | PRN
  Administered 2012-04-08: 10 mL
  Filled 2012-04-08: qty 10

## 2012-04-08 MED ORDER — DEXTROSE 5 % IV SOLN
20.0000 mg/m2 | Freq: Once | INTRAVENOUS | Status: AC
  Administered 2012-04-08: 46 mg via INTRAVENOUS
  Filled 2012-04-08: qty 23

## 2012-04-08 NOTE — Patient Instructions (Signed)
[facility] Discharge Instructions for Patients Receiving Chemotherapy  Today you received the following chemotherapy agents kyprolis  To help prevent nausea and vomiting after your treatment, we encourage you to take your nausea medication as needed If you develop nausea and vomiting that is not controlled by your nausea medication, call the clinic. If it is after clinic hours your family physician or the after hours number for the clinic or go to the Emergency Department.   BELOW ARE SYMPTOMS THAT SHOULD BE REPORTED IMMEDIATELY:  *FEVER GREATER THAN 100.5 F  *CHILLS WITH OR WITHOUT FEVER  NAUSEA AND VOMITING THAT IS NOT CONTROLLED WITH YOUR NAUSEA MEDICATION  *UNUSUAL SHORTNESS OF BREATH  *UNUSUAL BRUISING OR BLEEDING  TENDERNESS IN MOUTH AND THROAT WITH OR WITHOUT PRESENCE OF ULCERS  *URINARY PROBLEMS  *BOWEL PROBLEMS  UNUSUAL RASH Items with * indicate a potential emergency and should be followed up as soon as possible.  . The clinic phone number is [phone].

## 2012-04-14 ENCOUNTER — Ambulatory Visit (HOSPITAL_BASED_OUTPATIENT_CLINIC_OR_DEPARTMENT_OTHER): Payer: Medicaid Other

## 2012-04-14 ENCOUNTER — Other Ambulatory Visit (HOSPITAL_BASED_OUTPATIENT_CLINIC_OR_DEPARTMENT_OTHER): Payer: Medicaid Other | Admitting: Lab

## 2012-04-14 VITALS — BP 128/80 | HR 71 | Temp 98.5°F | Resp 20

## 2012-04-14 DIAGNOSIS — C9000 Multiple myeloma not having achieved remission: Secondary | ICD-10-CM

## 2012-04-14 DIAGNOSIS — Z5112 Encounter for antineoplastic immunotherapy: Secondary | ICD-10-CM

## 2012-04-14 LAB — CBC WITH DIFFERENTIAL/PLATELET
Basophils Absolute: 0 10*3/uL (ref 0.0–0.1)
Eosinophils Absolute: 0 10*3/uL (ref 0.0–0.5)
HGB: 11.9 g/dL (ref 11.6–15.9)
MCV: 91.2 fL (ref 79.5–101.0)
MONO#: 0.3 10*3/uL (ref 0.1–0.9)
MONO%: 11.2 % (ref 0.0–14.0)
NEUT#: 1.5 10*3/uL (ref 1.5–6.5)
Platelets: 115 10*3/uL — ABNORMAL LOW (ref 145–400)
RBC: 4.1 10*6/uL (ref 3.70–5.45)
RDW: 18.2 % — ABNORMAL HIGH (ref 11.2–14.5)
WBC: 2.7 10*3/uL — ABNORMAL LOW (ref 3.9–10.3)
nRBC: 1 % — ABNORMAL HIGH (ref 0–0)

## 2012-04-14 LAB — COMPREHENSIVE METABOLIC PANEL (CC13)
ALT: 11 U/L (ref 0–55)
Albumin: 3.8 g/dL (ref 3.5–5.0)
CO2: 24 mEq/L (ref 22–29)
Calcium: 8.8 mg/dL (ref 8.4–10.4)
Chloride: 111 mEq/L — ABNORMAL HIGH (ref 98–107)
Glucose: 83 mg/dl (ref 70–99)
Potassium: 4.5 mEq/L (ref 3.5–5.1)
Sodium: 142 mEq/L (ref 136–145)
Total Protein: 5.9 g/dL — ABNORMAL LOW (ref 6.4–8.3)

## 2012-04-14 MED ORDER — DEXTROSE 5 % IV SOLN
20.0000 mg/m2 | Freq: Once | INTRAVENOUS | Status: AC
  Administered 2012-04-14: 46 mg via INTRAVENOUS
  Filled 2012-04-14: qty 23

## 2012-04-14 MED ORDER — DEXAMETHASONE SODIUM PHOSPHATE 10 MG/ML IJ SOLN
10.0000 mg | Freq: Once | INTRAMUSCULAR | Status: AC
  Administered 2012-04-14: 10 mg via INTRAVENOUS

## 2012-04-14 MED ORDER — HEPARIN SOD (PORK) LOCK FLUSH 100 UNIT/ML IV SOLN
500.0000 [IU] | Freq: Once | INTRAVENOUS | Status: AC | PRN
  Administered 2012-04-14: 500 [IU]
  Filled 2012-04-14: qty 5

## 2012-04-14 MED ORDER — SODIUM CHLORIDE 0.9 % IV SOLN: Freq: Once | INTRAVENOUS | Status: DC

## 2012-04-14 MED ORDER — SODIUM CHLORIDE 0.9 % IV SOLN
Freq: Once | INTRAVENOUS | Status: AC
  Administered 2012-04-14: 15:00:00 via INTRAVENOUS

## 2012-04-14 MED ORDER — ONDANSETRON 8 MG/50ML IVPB (CHCC)
8.0000 mg | Freq: Once | INTRAVENOUS | Status: AC
  Administered 2012-04-14: 8 mg via INTRAVENOUS

## 2012-04-14 MED ORDER — SODIUM CHLORIDE 0.9 % IJ SOLN
10.0000 mL | INTRAMUSCULAR | Status: DC | PRN
  Administered 2012-04-14: 10 mL
  Filled 2012-04-14: qty 10

## 2012-04-14 NOTE — Progress Notes (Signed)
Pt received 250 mL NS prior to [employer]

## 2012-04-14 NOTE — Patient Instructions (Signed)
[facility] Discharge Instructions for Patients Receiving Chemotherapy  Today you received the following chemotherapy agents: kyprolis  To help prevent nausea and vomiting after your treatment, we encourage you to take your nausea medication.  Take it as often as prescribed.     If you develop nausea and vomiting that is not controlled by your nausea medication, call the clinic. If it is after clinic hours your family physician or the after hours number for the clinic or go to the Emergency Department.   BELOW ARE SYMPTOMS THAT SHOULD BE REPORTED IMMEDIATELY:  *FEVER GREATER THAN 100.5 F  *CHILLS WITH OR WITHOUT FEVER  NAUSEA AND VOMITING THAT IS NOT CONTROLLED WITH YOUR NAUSEA MEDICATION  *UNUSUAL SHORTNESS OF BREATH  *UNUSUAL BRUISING OR BLEEDING  TENDERNESS IN MOUTH AND THROAT WITH OR WITHOUT PRESENCE OF ULCERS  *URINARY PROBLEMS  *BOWEL PROBLEMS  UNUSUAL RASH Items with * indicate a potential emergency and should be followed up as soon as possible.  Feel free to call the clinic you have any questions or concerns. The clinic phone number is [phone].   I have been informed and understand all the instructions given to me. I know to contact the clinic, my physician, or go to the Emergency Department if any problems should occur. I do not have any questions at this time, but understand that I may call the clinic during office hours   should I have any questions or need assistance in obtaining follow up care.    __________________________________________  _____________  __________ Signature of Patient or Authorized Representative            Date                   Time    __________________________________________ Nurse's Signature    

## 2012-04-15 ENCOUNTER — Ambulatory Visit (HOSPITAL_BASED_OUTPATIENT_CLINIC_OR_DEPARTMENT_OTHER): Payer: Medicaid Other

## 2012-04-15 VITALS — BP 128/69 | HR 66 | Temp 98.6°F | Resp 20

## 2012-04-15 DIAGNOSIS — Z5112 Encounter for antineoplastic immunotherapy: Secondary | ICD-10-CM

## 2012-04-15 DIAGNOSIS — C9000 Multiple myeloma not having achieved remission: Secondary | ICD-10-CM

## 2012-04-15 MED ORDER — DEXAMETHASONE SODIUM PHOSPHATE 10 MG/ML IJ SOLN
10.0000 mg | Freq: Once | INTRAMUSCULAR | Status: AC
  Administered 2012-04-15: 10 mg via INTRAVENOUS

## 2012-04-15 MED ORDER — SODIUM CHLORIDE 0.9 % IV SOLN
Freq: Once | INTRAVENOUS | Status: AC
  Administered 2012-04-15: 15:00:00 via INTRAVENOUS

## 2012-04-15 MED ORDER — DEXTROSE 5 % IV SOLN
20.0000 mg/m2 | Freq: Once | INTRAVENOUS | Status: AC
  Administered 2012-04-15: 46 mg via INTRAVENOUS
  Filled 2012-04-15: qty 23

## 2012-04-15 MED ORDER — ONDANSETRON 8 MG/50ML IVPB (CHCC)
8.0000 mg | Freq: Once | INTRAVENOUS | Status: AC
  Administered 2012-04-15: 8 mg via INTRAVENOUS

## 2012-04-15 NOTE — Patient Instructions (Addendum)
[facility] Discharge Instructions for Patients Receiving Chemotherapy  Today you received the following chemotherapy agents: Kyprolis. To help prevent nausea and vomiting after your treatment, we encourage you to take your nausea medication.    If you develop nausea and vomiting that is not controlled by your nausea medication, call the clinic. If it is after clinic hours your family physician or the after hours number for the clinic or go to the Emergency Department.   BELOW ARE SYMPTOMS THAT SHOULD BE REPORTED IMMEDIATELY:  *FEVER GREATER THAN 100.5 F  *CHILLS WITH OR WITHOUT FEVER  NAUSEA AND VOMITING THAT IS NOT CONTROLLED WITH YOUR NAUSEA MEDICATION  *UNUSUAL SHORTNESS OF BREATH  *UNUSUAL BRUISING OR BLEEDING  TENDERNESS IN MOUTH AND THROAT WITH OR WITHOUT PRESENCE OF ULCERS  *URINARY PROBLEMS  *BOWEL PROBLEMS  UNUSUAL RASH Items with * indicate a potential emergency and should be followed up as soon as possible.  Feel free to call the clinic you have any questions or concerns. The clinic phone number is [phone].    

## 2012-04-16 ENCOUNTER — Telehealth: Payer: Self-pay | Admitting: Oncology

## 2012-04-16 NOTE — Telephone Encounter (Signed)
Chart opened in error

## 2012-04-17 ENCOUNTER — Telehealth: Payer: Self-pay | Admitting: Oncology

## 2012-04-17 LAB — KAPPA/LAMBDA LIGHT CHAINS
Kappa free light chain: 176 mg/dL — ABNORMAL HIGH (ref 0.33–1.94)
Lambda Free Lght Chn: 0.1 mg/dL — ABNORMAL LOW (ref 0.57–2.63)

## 2012-04-17 NOTE — Telephone Encounter (Signed)
s.w. pt and advised her on her 85.1.11 appt chemo still on 11.5 and 11.6

## 2012-04-24 ENCOUNTER — Other Ambulatory Visit (HOSPITAL_BASED_OUTPATIENT_CLINIC_OR_DEPARTMENT_OTHER): Payer: Medicaid Other | Admitting: Lab

## 2012-04-24 ENCOUNTER — Telehealth: Payer: Self-pay | Admitting: *Deleted

## 2012-04-24 ENCOUNTER — Ambulatory Visit (HOSPITAL_BASED_OUTPATIENT_CLINIC_OR_DEPARTMENT_OTHER): Payer: Medicaid Other | Admitting: Oncology

## 2012-04-24 ENCOUNTER — Telehealth: Payer: Self-pay | Admitting: Oncology

## 2012-04-24 VITALS — BP 151/81 | HR 69 | Temp 97.9°F | Resp 20 | Ht 69.0 in | Wt 262.1 lb

## 2012-04-24 DIAGNOSIS — D649 Anemia, unspecified: Secondary | ICD-10-CM

## 2012-04-24 DIAGNOSIS — C9000 Multiple myeloma not having achieved remission: Secondary | ICD-10-CM

## 2012-04-24 DIAGNOSIS — R52 Pain, unspecified: Secondary | ICD-10-CM

## 2012-04-24 DIAGNOSIS — Z86718 Personal history of other venous thrombosis and embolism: Secondary | ICD-10-CM

## 2012-04-24 LAB — CBC WITH DIFFERENTIAL/PLATELET
Basophils Absolute: 0 10*3/uL (ref 0.0–0.1)
Eosinophils Absolute: 0 10*3/uL (ref 0.0–0.5)
HGB: 11.9 g/dL (ref 11.6–15.9)
MCV: 94.8 fL (ref 79.5–101.0)
MONO#: 0.4 10*3/uL (ref 0.1–0.9)
NEUT#: 2.1 10*3/uL (ref 1.5–6.5)
RDW: 17.9 % — ABNORMAL HIGH (ref 11.2–14.5)
WBC: 3.1 10*3/uL — ABNORMAL LOW (ref 3.9–10.3)
lymph#: 0.5 10*3/uL — ABNORMAL LOW (ref 0.9–3.3)

## 2012-04-24 LAB — COMPREHENSIVE METABOLIC PANEL (CC13)
Albumin: 3.7 g/dL (ref 3.5–5.0)
BUN: 20 mg/dL (ref 7.0–26.0)
CO2: 27 mEq/L (ref 22–29)
Calcium: 8.9 mg/dL (ref 8.4–10.4)
Chloride: 114 mEq/L — ABNORMAL HIGH (ref 98–107)
Glucose: 52 mg/dl — ABNORMAL LOW (ref 70–99)
Potassium: 4.4 mEq/L (ref 3.5–5.1)
Total Protein: 5.9 g/dL — ABNORMAL LOW (ref 6.4–8.3)

## 2012-04-24 MED ORDER — OXYCODONE HCL 5 MG PO TABS: 5.0000 mg | ORAL_TABLET | ORAL | Status: DC | PRN

## 2012-04-24 NOTE — Telephone Encounter (Signed)
Per staff phone call and POF I have scheduled appts. JMW  

## 2012-04-24 NOTE — Telephone Encounter (Signed)
appts made and pt will get a sch on 11/5

## 2012-04-25 ENCOUNTER — Encounter: Payer: Self-pay | Admitting: Oncology

## 2012-04-25 NOTE — Progress Notes (Signed)
Hematology and Oncology Follow Up Visit  [name] [id number] 12/31/1950 61 y.o. 04/25/2012 12:56 PM      Principle Diagnosis:  This is a 61 year old female with the following issues:   1. 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  She had a free kappa light chain around 1500. 2. Diagnosis of deep vein thrombosis in July 2011.   Prior Therapy:    1. Initially treated with melphalan and prednisone.  She had a partial response with free light chains down as low as 19, M spike down to 0.52 g/dL.  2. Patient treated with Revlimid at 25 mg 3 weeks on and 1 week off between November 2009 until November 2010.  She had a partial response, light chains down to 33 and M spike was not detected.   3. She was treated on maintenance Revlimid 15 mg to maintain her partial response. 4. She was on Zometa intermittently, on hold know. 5. Currently anticoagulated with Coumadin 7.5 mg since July 2011. 6.     She is owas Velcade 1.3 mg/sq m for a total of 2.9 mg weekly, which started on 02/21/2011 with weekly dexamethasone .  Treatment has been on hold due to recurrent hospitalization for wound infection. Treatment restarted in in 06/2011 till 09/2011.  Current therapy: She was started on Kyprolis 02/04/12. She is here to start the fourth month of therapy.   Interim History:  Ms. Kautz presents today for an office followup visit.  This is a pleasant 61 year old female with a few comorbid conditions as outlined above. She has tolerated Kyprolis without complications at this time. She is reporting pain in both hips radiating to the back of her thighs which has improved significantly.  Using pain meds about 1-2 times per day. No neurological symptoms. She is able to ambulate and actually pain is relieved by stretching and ambulation. She takes Oxycodone which helps.  She does not report any obvious bleeding such as  epistaxis, gum bleeding, hemoptysis, melena, hematochezia or hematuria.  She does have some intermittent swelling in her right lower extremity.  She does not report any headaches, visual changes, any dysphagia, odynophagia, any nausea, vomiting, diarrhea, constipation, chest pain, shortness of breath, productive cough, abdominal pain, abdominal swelling, lower extremity paresthesias, bowel or bladder incontinence.  She denies any fevers, chills or night sweats.   Medications: I have reviewed the patient's current medications.  Allergies:  Allergies  Allergen Reactions  . Ibuprofen Other (See Comments)    Patient does not remember.   . Morphine And Related Other (See Comments)    Patient does not remember reaction, believes it caused raised marks on skin.    Past Medical History, Surgical history, Social history, and Family History were reviewed and updated.  Review of Systems: Constitutional:  Negative for fever, chills, night sweats, anorexia, weight loss, pain. Cardiovascular: no chest pain or dyspnea on exertion Respiratory: no cough, shortness of breath, or wheezing Neurological: no TIA or stroke symptoms Dermatological: negative ENT: negative Skin: Negative. Gastrointestinal: no abdominal pain, change in bowel habits, or black or bloody stools Genito-Urinary: no dysuria, trouble voiding, or hematuria Hematological and Lymphatic: negative Breast: negative for breast lumps Musculoskeletal: negative Remaining ROS negative.  Physical Exam: Blood pressure 151/81, pulse 69, temperature 97.9 F (36.6 C), temperature source Oral, resp. rate 20, height 5\' 9"  (1.753 m), weight 262 lb 1.6 oz (118.888 kg). ECOG: 1 General appearance: alert Head:  Normocephalic, without obvious abnormality, atraumatic Neck: no adenopathy, no carotid bruit, no JVD, supple, symmetrical, trachea midline and thyroid not enlarged, symmetric, no tenderness/mass/nodules Lymph nodes: Cervical, supraclavicular,  and axillary nodes normal. Heart:regular rate and rhythm, S1, S2 normal, no murmur, click, rub or gallop Lung:chest clear, no wheezing, rales, normal symmetric air entry, Heart exam - S1, S2 normal, no murmur, no gallop, rate regular Abdomen: soft, non-tender, without masses or organomegaly. Wound appear have healed. EXT:no erythema, induration, or nodules   Lab Results: Lab Results  Component Value Date   WBC 3.1* 04/24/2012   HGB 11.9 04/24/2012   HCT 37.3 04/24/2012   MCV 94.8 04/24/2012   PLT 118* 04/24/2012     Chemistry      Component Value Date/Time   NA 144 04/24/2012 1415   NA 137 02/11/2012 1445   K 4.4 04/24/2012 1415   K 5.2 02/11/2012 1445   CL 114* 04/24/2012 1415   CL 109 02/11/2012 1445   CO2 27 04/24/2012 1415   CO2 20 02/11/2012 1445   BUN 20.0 04/24/2012 1415   BUN 62* 02/11/2012 1445   CREATININE 1.0 04/24/2012 1415   CREATININE 2.40* 02/11/2012 1445      Component Value Date/Time   CALCIUM 8.9 04/24/2012 1415   CALCIUM 9.4 02/11/2012 1445   ALKPHOS 94 04/24/2012 1415   ALKPHOS 57 02/11/2012 1445   AST 16 04/24/2012 1415   AST 15 02/11/2012 1445   ALT 16 04/24/2012 1415   ALT 12 02/11/2012 1445   BILITOT 0.67 04/24/2012 1415   BILITOT 0.5 02/11/2012 1445     Results for [name], [name] (MRN [id number]) as of 04/25/2012 12:57  Ref. Range 11/05/2011 14:46 12/19/2011 14:48 03/10/2012 14:21 03/31/2012 14:20 04/14/2012 14:29  Kappa free light chain Latest Range: 0.33-1.94 mg/dL 4098.11 (H) 9147.82 (H) 222.00 (H) 335.00 (H) 176.00 (H)     Impression and Plan:  This is a 61 year old female with the following issues:  1. Light chain multiple myeloma. She is on Kyprolis and tolerating this well overall. She has mild thrombocytopenia which has resolved. The plan is to proceed with Kyprolis cycle 4 day 1 on 04/28/12. Her light chains have dropped dramatically with treatment.  2. Pain in her hips/legs: Plain film x-rays show multiple myeloma. Continue Oxycodone. 3. Anemia,  multifactorial. No active bleeding. No transfusion is indicated at this time. Hemoglobin has normalized. 4. History of deep vein thrombosis. Previously anticoagulated with Coumadin. No new clots noted.  5. Hypertension seems to be under relatively good control.  6. Follow up. In about 1 month for cycle 5.     CURCIO, KRISTIN 11/2/201312:56 PM

## 2012-04-28 ENCOUNTER — Other Ambulatory Visit: Payer: Medicaid Other | Admitting: Lab

## 2012-04-28 ENCOUNTER — Ambulatory Visit (HOSPITAL_BASED_OUTPATIENT_CLINIC_OR_DEPARTMENT_OTHER): Payer: Medicaid Other

## 2012-04-28 ENCOUNTER — Encounter: Payer: Medicaid Other | Admitting: Oncology

## 2012-04-28 VITALS — BP 110/73 | HR 75 | Temp 99.1°F | Resp 20

## 2012-04-28 DIAGNOSIS — C9000 Multiple myeloma not having achieved remission: Secondary | ICD-10-CM

## 2012-04-28 DIAGNOSIS — Z5112 Encounter for antineoplastic immunotherapy: Secondary | ICD-10-CM

## 2012-04-28 MED ORDER — DEXTROSE 5 % IV SOLN
20.0000 mg/m2 | Freq: Once | INTRAVENOUS | Status: AC
  Administered 2012-04-28: 46 mg via INTRAVENOUS
  Filled 2012-04-28: qty 23

## 2012-04-28 MED ORDER — DEXAMETHASONE SODIUM PHOSPHATE 10 MG/ML IJ SOLN
10.0000 mg | Freq: Once | INTRAMUSCULAR | Status: AC
  Administered 2012-04-28: 10 mg via INTRAVENOUS

## 2012-04-28 MED ORDER — SODIUM CHLORIDE 0.9 % IJ SOLN
10.0000 mL | INTRAMUSCULAR | Status: DC | PRN
  Administered 2012-04-28: 10 mL
  Filled 2012-04-28: qty 10

## 2012-04-28 MED ORDER — HEPARIN SOD (PORK) LOCK FLUSH 100 UNIT/ML IV SOLN
500.0000 [IU] | Freq: Once | INTRAVENOUS | Status: AC | PRN
  Administered 2012-04-28: 500 [IU]
  Filled 2012-04-28: qty 5

## 2012-04-28 MED ORDER — ONDANSETRON 8 MG/50ML IVPB (CHCC)
8.0000 mg | Freq: Once | INTRAVENOUS | Status: AC
  Administered 2012-04-28: 8 mg via INTRAVENOUS

## 2012-04-28 MED ORDER — DOXYCYCLINE HYCLATE 100 MG PO TABS: 100.0000 mg | ORAL_TABLET | Freq: Two times a day (BID) | ORAL | Status: DC

## 2012-04-28 MED ORDER — SODIUM CHLORIDE 0.9 % IV SOLN
Freq: Once | INTRAVENOUS | Status: AC
  Administered 2012-04-28: 20 mL via INTRAVENOUS

## 2012-04-28 NOTE — Progress Notes (Signed)
This encounter was created in error - please disregard.

## 2012-04-29 ENCOUNTER — Ambulatory Visit (HOSPITAL_BASED_OUTPATIENT_CLINIC_OR_DEPARTMENT_OTHER): Payer: Medicaid Other

## 2012-04-29 DIAGNOSIS — C9000 Multiple myeloma not having achieved remission: Secondary | ICD-10-CM

## 2012-04-29 DIAGNOSIS — Z5112 Encounter for antineoplastic immunotherapy: Secondary | ICD-10-CM

## 2012-04-29 MED ORDER — SODIUM CHLORIDE 0.9 % IV SOLN
Freq: Once | INTRAVENOUS | Status: AC
  Administered 2012-04-29: 15:00:00 via INTRAVENOUS

## 2012-04-29 MED ORDER — SODIUM CHLORIDE 0.9 % IJ SOLN
10.0000 mL | INTRAMUSCULAR | Status: DC | PRN
  Administered 2012-04-29: 10 mL
  Filled 2012-04-29: qty 10

## 2012-04-29 MED ORDER — HEPARIN SOD (PORK) LOCK FLUSH 100 UNIT/ML IV SOLN
500.0000 [IU] | Freq: Once | INTRAVENOUS | Status: AC | PRN
  Administered 2012-04-29: 500 [IU]
  Filled 2012-04-29: qty 5

## 2012-04-29 MED ORDER — ONDANSETRON 8 MG/50ML IVPB (CHCC)
8.0000 mg | Freq: Once | INTRAVENOUS | Status: AC
  Administered 2012-04-29: 8 mg via INTRAVENOUS

## 2012-04-29 MED ORDER — SODIUM CHLORIDE 0.9 % IV SOLN: Freq: Once | INTRAVENOUS | Status: DC

## 2012-04-29 MED ORDER — DEXAMETHASONE SODIUM PHOSPHATE 10 MG/ML IJ SOLN
10.0000 mg | Freq: Once | INTRAMUSCULAR | Status: AC
  Administered 2012-04-29: 10 mg via INTRAVENOUS

## 2012-04-29 MED ORDER — DEXTROSE 5 % IV SOLN
20.0000 mg/m2 | Freq: Once | INTRAVENOUS | Status: AC
  Administered 2012-04-29: 46 mg via INTRAVENOUS
  Filled 2012-04-29: qty 23

## 2012-04-29 NOTE — Patient Instructions (Signed)
[facility] Discharge Instructions for Patients Receiving Chemotherapy  Today you received the following chemotherapy agents Kyprolis  To help prevent nausea and vomiting after your treatment, we encourage you to take your nausea medication as prescribed.   If you develop nausea and vomiting that is not controlled by your nausea medication, call the clinic. If it is after clinic hours your family physician or the after hours number for the clinic or go to the Emergency Department.   BELOW ARE SYMPTOMS THAT SHOULD BE REPORTED IMMEDIATELY:  *FEVER GREATER THAN 100.5 F  *CHILLS WITH OR WITHOUT FEVER  NAUSEA AND VOMITING THAT IS NOT CONTROLLED WITH YOUR NAUSEA MEDICATION  *UNUSUAL SHORTNESS OF BREATH  *UNUSUAL BRUISING OR BLEEDING  TENDERNESS IN MOUTH AND THROAT WITH OR WITHOUT PRESENCE OF ULCERS  *URINARY PROBLEMS  *BOWEL PROBLEMS  UNUSUAL RASH Items with * indicate a potential emergency and should be followed up as soon as possible.  Feel free to call the clinic you have any questions or concerns. The clinic phone number is [phone].   I have been informed and understand all the instructions given to me. I know to contact the clinic, my physician, or go to the Emergency Department if any problems should occur. I do not have any questions at this time, but understand that I may call the clinic during office hours   should I have any questions or need assistance in obtaining follow up care.    

## 2012-05-05 ENCOUNTER — Ambulatory Visit (HOSPITAL_BASED_OUTPATIENT_CLINIC_OR_DEPARTMENT_OTHER): Payer: Medicaid Other

## 2012-05-05 ENCOUNTER — Other Ambulatory Visit (HOSPITAL_BASED_OUTPATIENT_CLINIC_OR_DEPARTMENT_OTHER): Payer: Medicaid Other | Admitting: Lab

## 2012-05-05 VITALS — BP 137/92 | HR 66 | Temp 98.3°F

## 2012-05-05 DIAGNOSIS — Z5112 Encounter for antineoplastic immunotherapy: Secondary | ICD-10-CM

## 2012-05-05 DIAGNOSIS — C9000 Multiple myeloma not having achieved remission: Secondary | ICD-10-CM

## 2012-05-05 LAB — CBC WITH DIFFERENTIAL/PLATELET
Basophils Absolute: 0 10*3/uL (ref 0.0–0.1)
EOS%: 0.4 % (ref 0.0–7.0)
Eosinophils Absolute: 0 10*3/uL (ref 0.0–0.5)
HGB: 13.1 g/dL (ref 11.6–15.9)
LYMPH%: 36.9 % (ref 14.0–49.7)
MCH: 29.4 pg (ref 25.1–34.0)
MCV: 92.6 fL (ref 79.5–101.0)
MONO%: 13.1 % (ref 0.0–14.0)
NEUT#: 1.2 10*3/uL — ABNORMAL LOW (ref 1.5–6.5)
NEUT%: 49.2 % (ref 38.4–76.8)
Platelets: 148 10*3/uL (ref 145–400)
RDW: 15.5 % — ABNORMAL HIGH (ref 11.2–14.5)

## 2012-05-05 LAB — COMPREHENSIVE METABOLIC PANEL (CC13)
Alkaline Phosphatase: 104 U/L (ref 40–150)
BUN: 23 mg/dL (ref 7.0–26.0)
CO2: 27 mEq/L (ref 22–29)
Creatinine: 1.2 mg/dL — ABNORMAL HIGH (ref 0.6–1.1)
Glucose: 64 mg/dl — ABNORMAL LOW (ref 70–99)
Total Bilirubin: 0.92 mg/dL (ref 0.20–1.20)
Total Protein: 6.4 g/dL (ref 6.4–8.3)

## 2012-05-05 MED ORDER — DEXAMETHASONE SODIUM PHOSPHATE 10 MG/ML IJ SOLN
10.0000 mg | Freq: Once | INTRAMUSCULAR | Status: AC
  Administered 2012-05-05: 10 mg via INTRAVENOUS

## 2012-05-05 MED ORDER — SODIUM CHLORIDE 0.9 % IV SOLN: Freq: Once | INTRAVENOUS | Status: AC

## 2012-05-05 MED ORDER — SODIUM CHLORIDE 0.9 % IV SOLN
Freq: Once | INTRAVENOUS | Status: AC
  Administered 2012-05-05: 15:00:00 via INTRAVENOUS

## 2012-05-05 MED ORDER — HEPARIN SOD (PORK) LOCK FLUSH 100 UNIT/ML IV SOLN
500.0000 [IU] | Freq: Once | INTRAVENOUS | Status: AC | PRN
  Administered 2012-05-05: 500 [IU]
  Filled 2012-05-05: qty 5

## 2012-05-05 MED ORDER — ONDANSETRON 8 MG/50ML IVPB (CHCC)
8.0000 mg | Freq: Once | INTRAVENOUS | Status: AC
  Administered 2012-05-05: 8 mg via INTRAVENOUS

## 2012-05-05 MED ORDER — SODIUM CHLORIDE 0.9 % IJ SOLN
10.0000 mL | INTRAMUSCULAR | Status: DC | PRN
Start: 2012-05-05 — End: 2012-05-05
  Administered 2012-05-05: 10 mL
  Filled 2012-05-05: qty 10

## 2012-05-05 MED ORDER — DEXTROSE 5 % IV SOLN
20.0000 mg/m2 | Freq: Once | INTRAVENOUS | Status: AC
  Administered 2012-05-05: 46 mg via INTRAVENOUS
  Filled 2012-05-05: qty 23

## 2012-05-05 NOTE — Progress Notes (Signed)
Ok to treat ANC 1.2 per Dr.Shadad

## 2012-05-05 NOTE — Patient Instructions (Signed)
[facility] Discharge Instructions for Patients Receiving Chemotherapy  Today you received the following chemotherapy agents Kyprolis To help prevent nausea and vomiting after your treatment, we encourage you to take your nausea medication as prescribed.  If you develop nausea and vomiting that is not controlled by your nausea medication, call the clinic. If it is after clinic hours your family physician or the after hours number for the clinic or go to the Emergency Department.   BELOW ARE SYMPTOMS THAT SHOULD BE REPORTED IMMEDIATELY:  *FEVER GREATER THAN 100.5 F  *CHILLS WITH OR WITHOUT FEVER  NAUSEA AND VOMITING THAT IS NOT CONTROLLED WITH YOUR NAUSEA MEDICATION  *UNUSUAL SHORTNESS OF BREATH  *UNUSUAL BRUISING OR BLEEDING  TENDERNESS IN MOUTH AND THROAT WITH OR WITHOUT PRESENCE OF ULCERS  *URINARY PROBLEMS  *BOWEL PROBLEMS  UNUSUAL RASH Items with * indicate a potential emergency and should be followed up as soon as possible.  One of the nurses will contact you 24 hours after your treatment. Please let the nurse know about any problems that you may have experienced. Feel free to call the clinic you have any questions or concerns. The clinic phone number is [phone].   I have been informed and understand all the instructions given to me. I know to contact the clinic, my physician, or go to the Emergency Department if any problems should occur. I do not have any questions at this time, but understand that I may call the clinic during office hours   should I have any questions or need assistance in obtaining follow up care.    __________________________________________  _____________  __________ Signature of Patient or Authorized Representative            Date                   Time    __________________________________________ Nurse's Signature    

## 2012-05-06 ENCOUNTER — Ambulatory Visit (HOSPITAL_BASED_OUTPATIENT_CLINIC_OR_DEPARTMENT_OTHER): Payer: Medicaid Other

## 2012-05-06 VITALS — BP 114/73 | HR 70 | Temp 99.8°F | Resp 20

## 2012-05-06 DIAGNOSIS — Z5112 Encounter for antineoplastic immunotherapy: Secondary | ICD-10-CM

## 2012-05-06 DIAGNOSIS — C9000 Multiple myeloma not having achieved remission: Secondary | ICD-10-CM

## 2012-05-06 MED ORDER — DEXTROSE 5 % IV SOLN
20.0000 mg/m2 | Freq: Once | INTRAVENOUS | Status: AC
  Administered 2012-05-06: 46 mg via INTRAVENOUS
  Filled 2012-05-06: qty 23

## 2012-05-06 MED ORDER — DEXAMETHASONE SODIUM PHOSPHATE 10 MG/ML IJ SOLN
10.0000 mg | Freq: Once | INTRAMUSCULAR | Status: AC
  Administered 2012-05-06: 10 mg via INTRAVENOUS

## 2012-05-06 MED ORDER — SODIUM CHLORIDE 0.9 % IV SOLN
Freq: Once | INTRAVENOUS | Status: AC
  Administered 2012-05-06: 15:00:00 via INTRAVENOUS

## 2012-05-06 MED ORDER — HEPARIN SOD (PORK) LOCK FLUSH 100 UNIT/ML IV SOLN
500.0000 [IU] | Freq: Once | INTRAVENOUS | Status: AC | PRN
  Administered 2012-05-06: 500 [IU]
  Filled 2012-05-06: qty 5

## 2012-05-06 MED ORDER — ONDANSETRON 8 MG/50ML IVPB (CHCC)
8.0000 mg | Freq: Once | INTRAVENOUS | Status: AC
  Administered 2012-05-06: 8 mg via INTRAVENOUS

## 2012-05-06 MED ORDER — SODIUM CHLORIDE 0.9 % IJ SOLN
10.0000 mL | INTRAMUSCULAR | Status: DC | PRN
  Administered 2012-05-06: 10 mL
  Filled 2012-05-06: qty 10

## 2012-05-06 NOTE — Patient Instructions (Addendum)
[facility] Discharge Instructions for Patients Receiving Chemotherapy  Today you received the following chemotherapy agents Kyprolis To help prevent nausea and vomiting after your treatment, we encourage you to take your nausea medication as prescribed.  If you develop nausea and vomiting that is not controlled by your nausea medication, call the clinic. If it is after clinic hours your family physician or the after hours number for the clinic or go to the Emergency Department.   BELOW ARE SYMPTOMS THAT SHOULD BE REPORTED IMMEDIATELY:  *FEVER GREATER THAN 100.5 F  *CHILLS WITH OR WITHOUT FEVER  NAUSEA AND VOMITING THAT IS NOT CONTROLLED WITH YOUR NAUSEA MEDICATION  *UNUSUAL SHORTNESS OF BREATH  *UNUSUAL BRUISING OR BLEEDING  TENDERNESS IN MOUTH AND THROAT WITH OR WITHOUT PRESENCE OF ULCERS  *URINARY PROBLEMS  *BOWEL PROBLEMS  UNUSUAL RASH Items with * indicate a potential emergency and should be followed up as soon as possible.  One of the nurses will contact you 24 hours after your treatment. Please let the nurse know about any problems that you may have experienced. Feel free to call the clinic you have any questions or concerns. The clinic phone number is [phone].   I have been informed and understand all the instructions given to me. I know to contact the clinic, my physician, or go to the Emergency Department if any problems should occur. I do not have any questions at this time, but understand that I may call the clinic during office hours   should I have any questions or need assistance in obtaining follow up care.    __________________________________________  _____________  __________ Signature of Patient or Authorized Representative            Date                   Time    __________________________________________ Nurse's Signature    

## 2012-05-12 ENCOUNTER — Ambulatory Visit (HOSPITAL_BASED_OUTPATIENT_CLINIC_OR_DEPARTMENT_OTHER): Payer: Medicaid Other

## 2012-05-12 ENCOUNTER — Other Ambulatory Visit (HOSPITAL_BASED_OUTPATIENT_CLINIC_OR_DEPARTMENT_OTHER): Payer: Medicaid Other | Admitting: Lab

## 2012-05-12 ENCOUNTER — Other Ambulatory Visit: Payer: Self-pay | Admitting: Family Medicine

## 2012-05-12 VITALS — BP 153/72 | HR 78 | Temp 97.5°F | Resp 20

## 2012-05-12 DIAGNOSIS — C9000 Multiple myeloma not having achieved remission: Secondary | ICD-10-CM

## 2012-05-12 DIAGNOSIS — Z5112 Encounter for antineoplastic immunotherapy: Secondary | ICD-10-CM

## 2012-05-12 LAB — CBC WITH DIFFERENTIAL/PLATELET
BASO%: 0.8 % (ref 0.0–2.0)
EOS%: 1.2 % (ref 0.0–7.0)
MCH: 29.5 pg (ref 25.1–34.0)
MCHC: 31.6 g/dL (ref 31.5–36.0)
MCV: 93.4 fL (ref 79.5–101.0)
MONO%: 10.9 % (ref 0.0–14.0)
NEUT%: 57.1 % (ref 38.4–76.8)
RDW: 15.6 % — ABNORMAL HIGH (ref 11.2–14.5)
lymph#: 0.8 10*3/uL — ABNORMAL LOW (ref 0.9–3.3)

## 2012-05-12 LAB — COMPREHENSIVE METABOLIC PANEL (CC13)
AST: 14 U/L (ref 5–34)
Albumin: 3.7 g/dL (ref 3.5–5.0)
Alkaline Phosphatase: 94 U/L (ref 40–150)
BUN: 19 mg/dL (ref 7.0–26.0)
Potassium: 4.2 mEq/L (ref 3.5–5.1)
Sodium: 146 mEq/L — ABNORMAL HIGH (ref 136–145)
Total Bilirubin: 0.64 mg/dL (ref 0.20–1.20)

## 2012-05-12 MED ORDER — SODIUM CHLORIDE 0.9 % IV SOLN: Freq: Once | INTRAVENOUS | Status: DC

## 2012-05-12 MED ORDER — DEXAMETHASONE SODIUM PHOSPHATE 10 MG/ML IJ SOLN
10.0000 mg | Freq: Once | INTRAMUSCULAR | Status: AC
  Administered 2012-05-12: 10 mg via INTRAVENOUS

## 2012-05-12 MED ORDER — HEPARIN SOD (PORK) LOCK FLUSH 100 UNIT/ML IV SOLN
500.0000 [IU] | Freq: Once | INTRAVENOUS | Status: AC | PRN
  Administered 2012-05-12: 500 [IU]
  Filled 2012-05-12: qty 5

## 2012-05-12 MED ORDER — ONDANSETRON 8 MG/50ML IVPB (CHCC)
8.0000 mg | Freq: Once | INTRAVENOUS | Status: AC
  Administered 2012-05-12: 8 mg via INTRAVENOUS

## 2012-05-12 MED ORDER — SODIUM CHLORIDE 0.9 % IV SOLN
Freq: Once | INTRAVENOUS | Status: AC
  Administered 2012-05-12: 15:00:00 via INTRAVENOUS

## 2012-05-12 MED ORDER — DEXTROSE 5 % IV SOLN
20.0000 mg/m2 | Freq: Once | INTRAVENOUS | Status: AC
  Administered 2012-05-12: 46 mg via INTRAVENOUS
  Filled 2012-05-12: qty 23

## 2012-05-12 MED ORDER — SODIUM CHLORIDE 0.9 % IJ SOLN
10.0000 mL | INTRAMUSCULAR | Status: DC | PRN
  Administered 2012-05-12: 10 mL
  Filled 2012-05-12: qty 10

## 2012-05-12 NOTE — Patient Instructions (Addendum)
[facility] Discharge Instructions for Patients Receiving Chemotherapy  Today you received the following chemotherapy agents Kyprolis  To help prevent nausea and vomiting after your treatment, we encourage you to take your nausea medication.    If you develop nausea and vomiting that is not controlled by your nausea medication, call the clinic. If it is after clinic hours your family physician or the after hours number for the clinic or go to the Emergency Department.   BELOW ARE SYMPTOMS THAT SHOULD BE REPORTED IMMEDIATELY:  *FEVER GREATER THAN 101.0 F  *CHILLS WITH OR WITHOUT FEVER  NAUSEA AND VOMITING THAT IS NOT CONTROLLED WITH YOUR NAUSEA MEDICATION  *UNUSUAL SHORTNESS OF BREATH  *UNUSUAL BRUISING OR BLEEDING  TENDERNESS IN MOUTH AND THROAT WITH OR WITHOUT PRESENCE OF ULCERS  *URINARY PROBLEMS  *BOWEL PROBLEMS  UNUSUAL RASH Items with * indicate a potential emergency and should be followed up as soon as possible.  One of the nurses will contact you 24 hours after your treatment. Please let the nurse know about any problems that you may have experienced. Feel free to call the clinic you have any questions or concerns. The clinic phone number is [phone].   I have been informed and understand all the instructions given to me. I know to contact the clinic, my physician, or go to the Emergency Department if any problems should occur. I do not have any questions at this time, but understand that I may call the clinic during office hours or the Patient Navigator at [phone] should I have any questions or need assistance in obtaining follow up care.    __________________________________________  _____________  __________ Signature of Patient or Authorized Representative            Date                   Time    __________________________________________ Nurse's Signature

## 2012-05-12 NOTE — Telephone Encounter (Signed)
No paper chart °

## 2012-05-13 ENCOUNTER — Ambulatory Visit (HOSPITAL_BASED_OUTPATIENT_CLINIC_OR_DEPARTMENT_OTHER): Payer: Medicaid Other

## 2012-05-13 VITALS — BP 127/60 | HR 67 | Temp 98.8°F

## 2012-05-13 DIAGNOSIS — C9000 Multiple myeloma not having achieved remission: Secondary | ICD-10-CM

## 2012-05-13 DIAGNOSIS — Z5112 Encounter for antineoplastic immunotherapy: Secondary | ICD-10-CM

## 2012-05-13 MED ORDER — SODIUM CHLORIDE 0.9 % IJ SOLN
10.0000 mL | INTRAMUSCULAR | Status: DC | PRN
  Filled 2012-05-13: qty 10

## 2012-05-13 MED ORDER — ONDANSETRON 8 MG/50ML IVPB (CHCC)
8.0000 mg | Freq: Once | INTRAVENOUS | Status: AC
  Administered 2012-05-13: 8 mg via INTRAVENOUS

## 2012-05-13 MED ORDER — DEXTROSE 5 % IV SOLN
20.0000 mg/m2 | Freq: Once | INTRAVENOUS | Status: AC
  Administered 2012-05-13: 46 mg via INTRAVENOUS
  Filled 2012-05-13: qty 23

## 2012-05-13 MED ORDER — DEXAMETHASONE SODIUM PHOSPHATE 10 MG/ML IJ SOLN
10.0000 mg | Freq: Once | INTRAMUSCULAR | Status: AC
  Administered 2012-05-13: 10 mg via INTRAVENOUS

## 2012-05-13 MED ORDER — HEPARIN SOD (PORK) LOCK FLUSH 100 UNIT/ML IV SOLN
500.0000 [IU] | Freq: Once | INTRAVENOUS | Status: DC | PRN
  Filled 2012-05-13: qty 5

## 2012-05-13 MED ORDER — SODIUM CHLORIDE 0.9 % IV SOLN
Freq: Once | INTRAVENOUS | Status: AC
  Administered 2012-05-13: 15:00:00 via INTRAVENOUS

## 2012-05-13 NOTE — Patient Instructions (Signed)
[facility] Discharge Instructions for Patients Receiving Chemotherapy  Today you received the following chemotherapy agents Kyprolis To help prevent nausea and vomiting after your treatment, we encourage you to take your nausea medication as prescribed.  If you develop nausea and vomiting that is not controlled by your nausea medication, call the clinic. If it is after clinic hours your family physician or the after hours number for the clinic or go to the Emergency Department.   BELOW ARE SYMPTOMS THAT SHOULD BE REPORTED IMMEDIATELY:  *FEVER GREATER THAN 100.5 F  *CHILLS WITH OR WITHOUT FEVER  NAUSEA AND VOMITING THAT IS NOT CONTROLLED WITH YOUR NAUSEA MEDICATION  *UNUSUAL SHORTNESS OF BREATH  *UNUSUAL BRUISING OR BLEEDING  TENDERNESS IN MOUTH AND THROAT WITH OR WITHOUT PRESENCE OF ULCERS  *URINARY PROBLEMS  *BOWEL PROBLEMS  UNUSUAL RASH Items with * indicate a potential emergency and should be followed up as soon as possible.  One of the nurses will contact you 24 hours after your treatment. Please let the nurse know about any problems that you may have experienced. Feel free to call the clinic you have any questions or concerns. The clinic phone number is [phone].   I have been informed and understand all the instructions given to me. I know to contact the clinic, my physician, or go to the Emergency Department if any problems should occur. I do not have any questions at this time, but understand that I may call the clinic during office hours   should I have any questions or need assistance in obtaining follow up care.    __________________________________________  _____________  __________ Signature of Patient or Authorized Representative            Date                   Time    __________________________________________ Nurse's Signature    

## 2012-05-14 LAB — KAPPA/LAMBDA LIGHT CHAINS: Lambda Free Lght Chn: 0.04 mg/dL — ABNORMAL LOW (ref 0.57–2.63)

## 2012-05-19 ENCOUNTER — Other Ambulatory Visit: Payer: Self-pay | Admitting: Oncology

## 2012-05-26 ENCOUNTER — Telehealth: Payer: Self-pay | Admitting: *Deleted

## 2012-05-26 ENCOUNTER — Ambulatory Visit (HOSPITAL_BASED_OUTPATIENT_CLINIC_OR_DEPARTMENT_OTHER): Payer: Medicaid Other | Admitting: Oncology

## 2012-05-26 ENCOUNTER — Ambulatory Visit (HOSPITAL_BASED_OUTPATIENT_CLINIC_OR_DEPARTMENT_OTHER): Payer: Medicaid Other

## 2012-05-26 ENCOUNTER — Other Ambulatory Visit (HOSPITAL_BASED_OUTPATIENT_CLINIC_OR_DEPARTMENT_OTHER): Payer: Medicaid Other | Admitting: Lab

## 2012-05-26 ENCOUNTER — Telehealth: Payer: Self-pay | Admitting: Oncology

## 2012-05-26 VITALS — BP 150/84 | HR 69 | Temp 98.9°F | Resp 20

## 2012-05-26 DIAGNOSIS — C9000 Multiple myeloma not having achieved remission: Secondary | ICD-10-CM

## 2012-05-26 DIAGNOSIS — M79609 Pain in unspecified limb: Secondary | ICD-10-CM

## 2012-05-26 DIAGNOSIS — Z86718 Personal history of other venous thrombosis and embolism: Secondary | ICD-10-CM

## 2012-05-26 DIAGNOSIS — Z5112 Encounter for antineoplastic immunotherapy: Secondary | ICD-10-CM

## 2012-05-26 DIAGNOSIS — D649 Anemia, unspecified: Secondary | ICD-10-CM

## 2012-05-26 LAB — COMPREHENSIVE METABOLIC PANEL (CC13)
AST: 17 U/L (ref 5–34)
Albumin: 3.8 g/dL (ref 3.5–5.0)
Alkaline Phosphatase: 101 U/L (ref 40–150)
BUN: 19 mg/dL (ref 7.0–26.0)
Creatinine: 1.2 mg/dL — ABNORMAL HIGH (ref 0.6–1.1)
Potassium: 4.2 mEq/L (ref 3.5–5.1)
Total Bilirubin: 0.52 mg/dL (ref 0.20–1.20)

## 2012-05-26 LAB — CBC WITH DIFFERENTIAL/PLATELET
Basophils Absolute: 0 10*3/uL (ref 0.0–0.1)
EOS%: 0.8 % (ref 0.0–7.0)
HGB: 13.2 g/dL (ref 11.6–15.9)
MCH: 30.4 pg (ref 25.1–34.0)
MCV: 94.8 fL (ref 79.5–101.0)
MONO%: 15.3 % — ABNORMAL HIGH (ref 0.0–14.0)
RDW: 15.4 % — ABNORMAL HIGH (ref 11.2–14.5)

## 2012-05-26 MED ORDER — HEPARIN SOD (PORK) LOCK FLUSH 100 UNIT/ML IV SOLN
500.0000 [IU] | Freq: Once | INTRAVENOUS | Status: AC | PRN
  Administered 2012-05-26: 500 [IU]
  Filled 2012-05-26: qty 5

## 2012-05-26 MED ORDER — ONDANSETRON 8 MG/50ML IVPB (CHCC)
8.0000 mg | Freq: Once | INTRAVENOUS | Status: AC
Start: 2012-05-26 — End: 2012-05-26
  Administered 2012-05-26: 8 mg via INTRAVENOUS

## 2012-05-26 MED ORDER — DEXAMETHASONE SODIUM PHOSPHATE 10 MG/ML IJ SOLN
10.0000 mg | Freq: Once | INTRAMUSCULAR | Status: AC
  Administered 2012-05-26: 10 mg via INTRAVENOUS

## 2012-05-26 MED ORDER — DEXTROSE 5 % IV SOLN
20.0000 mg/m2 | Freq: Once | INTRAVENOUS | Status: AC
  Administered 2012-05-26: 46 mg via INTRAVENOUS
  Filled 2012-05-26: qty 23

## 2012-05-26 MED ORDER — SODIUM CHLORIDE 0.9 % IV SOLN
Freq: Once | INTRAVENOUS | Status: AC
  Administered 2012-05-26: 17:00:00 via INTRAVENOUS

## 2012-05-26 MED ORDER — SODIUM CHLORIDE 0.9 % IV SOLN
Freq: Once | INTRAVENOUS | Status: AC
  Administered 2012-05-26: 16:00:00 via INTRAVENOUS

## 2012-05-26 MED ORDER — SODIUM CHLORIDE 0.9 % IJ SOLN
10.0000 mL | INTRAMUSCULAR | Status: DC | PRN
  Administered 2012-05-26: 10 mL
  Filled 2012-05-26: qty 10

## 2012-05-26 NOTE — Patient Instructions (Addendum)
[facility] Discharge Instructions for Patients Receiving Chemotherapy  Today you received the following chemotherapy agents; Kyprolis.  To help prevent nausea and vomiting after your treatment, we encourage you to take your nausea medication ;  Ondansetron (zofran) and Prochlorperazine (compazine) as directed.   If you develop nausea and vomiting that is not controlled by your nausea medication, call the clinic. If it is after clinic hours your family physician or the after hours number for the clinic or go to the Emergency Department.   BELOW ARE SYMPTOMS THAT SHOULD BE REPORTED IMMEDIATELY:  *FEVER GREATER THAN 100.5 F  *CHILLS WITH OR WITHOUT FEVER  NAUSEA AND VOMITING THAT IS NOT CONTROLLED WITH YOUR NAUSEA MEDICATION  *UNUSUAL SHORTNESS OF BREATH  *UNUSUAL BRUISING OR BLEEDING  TENDERNESS IN MOUTH AND THROAT WITH OR WITHOUT PRESENCE OF ULCERS  *URINARY PROBLEMS  *BOWEL PROBLEMS  UNUSUAL RASH Items with * indicate a potential emergency and should be followed up as soon as possible.  One of the nurses will contact you 24 hours after your treatment. Please let the nurse know about any problems that you may have experienced. Feel free to call the clinic you have any questions or concerns. The clinic phone number is [phone].   I have been informed and understand all the instructions given to me. I know to contact the clinic, my physician, or go to the Emergency Department if any problems should occur. I do not have any questions at this time, but understand that I may call the clinic during office hours   should I have any questions or need assistance in obtaining follow up care.    __________________________________________  _____________  __________ Signature of Patient or Authorized Representative            Date                   Time    __________________________________________ Nurse's Signature

## 2012-05-26 NOTE — Progress Notes (Signed)
Hematology and Oncology Follow Up Visit  [name] [id number] 1950/12/03 61 y.o. 05/26/2012 3:05 PM      Principle Diagnosis:  This is a 61 year old female with the following issues:   1. 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  She had a free kappa light chain around 1500. 2. Diagnosis of deep vein thrombosis in July 2011.   Prior Therapy:    1. Initially treated with melphalan and prednisone.  She had a partial response with free light chains down as low as 19, M spike down to 0.52 g/dL.  2. Patient treated with Revlimid at 25 mg 3 weeks on and 1 week off between November 2009 until November 2010.  She had a partial response, light chains down to 33 and M spike was not detected.   3. She was treated on maintenance Revlimid 15 mg to maintain her partial response. 4. She was on Zometa intermittently, on hold know. 5. Currently anticoagulated with Coumadin 7.5 mg since July 2011. 6.     She is owas Velcade 1.3 mg/sq m for a total of 2.9 mg weekly, which started on 02/21/2011 with weekly dexamethasone .  Treatment has been on hold due to recurrent hospitalization for wound infection. Treatment restarted in in 06/2011 till 09/2011.  Current therapy: She was started on Kyprolis 02/04/12. She is here to start the fifth month of therapy.   Interim History:  [name] presents today for an office followup visit. This is a pleasant 61 year old female with a few comorbid conditions as outlined above. She has tolerated Kyprolis without complications at this time. She is reporting pain in both hips radiating to the back of her thighs which has improved significantly.  Using pain meds about 1-2 times per day. No neurological symptoms. She is able to ambulate and actually pain is relieved by stretching and ambulation. She takes Oxycodone which helps.  She does not report any obvious bleeding such as  epistaxis, gum bleeding, hemoptysis, melena, hematochezia or hematuria.  She does have some intermittent swelling in her right lower extremity which is improved now.  She does not report any headaches, visual changes, any dysphagia, odynophagia, any nausea, vomiting, diarrhea, constipation, chest pain, shortness of breath, productive cough, abdominal pain, abdominal swelling, lower extremity paresthesias, bowel or bladder incontinence.  She denies any fevers, chills or night sweats.   Medications: I have reviewed the patient's current medications.  Allergies:  Allergies  Allergen Reactions  . Ibuprofen Other (See Comments)    Patient does not remember.   . Morphine And Related Other (See Comments)    Patient does not remember reaction, believes it caused raised marks on skin.    Past Medical History, Surgical history, Social history, and Family History were reviewed and updated.  Review of Systems: Constitutional:  Negative for fever, chills, night sweats, anorexia, weight loss, pain. Cardiovascular: no chest pain or dyspnea on exertion Respiratory: no cough, shortness of breath, or wheezing Neurological: no TIA or stroke symptoms Dermatological: negative ENT: negative Skin: Negative. Gastrointestinal: no abdominal pain, change in bowel habits, or black or bloody stools Genito-Urinary: no dysuria, trouble voiding, or hematuria Hematological and Lymphatic: negative Breast: negative for breast lumps Musculoskeletal: negative Remaining ROS negative.  Physical Exam: Blood pressure 150/84, pulse 69, temperature 98.9 F (37.2 C), temperature source Oral, resp. rate 20. ECOG: 1 General appearance: alert Head: Normocephalic, without obvious abnormality, atraumatic Neck: no adenopathy, no  carotid bruit, no JVD, supple, symmetrical, trachea midline and thyroid not enlarged, symmetric, no tenderness/mass/nodules Lymph nodes: Cervical, supraclavicular, and axillary nodes  normal. Heart:regular rate and rhythm, S1, S2 normal, no murmur, click, rub or gallop Lung:chest clear, no wheezing, rales, normal symmetric air entry, Heart exam - S1, S2 normal, no murmur, no gallop, rate regular Abdomen: soft, non-tender, without masses or organomegaly. Wound appear have healed. EXT:no erythema, induration, or nodules   Lab Results: Lab Results  Component Value Date   WBC 2.2* 05/26/2012   HGB 13.2 05/26/2012   HCT 41.2 05/26/2012   MCV 94.8 05/26/2012   PLT 151 05/26/2012     Chemistry      Component Value Date/Time   NA 146* 05/12/2012 1407   NA 137 02/11/2012 1445   K 4.2 05/12/2012 1407   K 5.2 02/11/2012 1445   CL 112* 05/12/2012 1407   CL 109 02/11/2012 1445   CO2 27 05/12/2012 1407   CO2 20 02/11/2012 1445   BUN 19.0 05/12/2012 1407   BUN 62* 02/11/2012 1445   CREATININE 1.2* 05/12/2012 1407   CREATININE 2.40* 02/11/2012 1445      Component Value Date/Time   CALCIUM 9.3 05/12/2012 1407   CALCIUM 9.4 02/11/2012 1445   ALKPHOS 94 05/12/2012 1407   ALKPHOS 57 02/11/2012 1445   AST 14 05/12/2012 1407   AST 15 02/11/2012 1445   ALT 10 05/12/2012 1407   ALT 12 02/11/2012 1445   BILITOT 0.64 05/12/2012 1407   BILITOT 0.5 02/11/2012 1445      Results for [name], [name] (MRN [id number]) as of 05/26/2012 15:07  Ref. Range 03/31/2012 14:20 04/14/2012 14:29 05/12/2012 14:07  Kappa free light chain Latest Range: 0.33-1.94 mg/dL 119.14 (H) 782.95 (H) 621.30 (H)     Impression and Plan:  This is a 61 year old female with the following issues:  1. Light chain multiple myeloma. She is on Kyprolis and tolerating this well overall. She has mild thrombocytopenia which has resolved. The plan is to proceed with Kyprolis cycle 5 day 1 on 05/26/12. Her light chains have dropped dramatically with treatment.  2. Pain in her hips/legs: Plain film x-rays show multiple myeloma. Continue Oxycodone. 3. Anemia, multifactorial. No active bleeding. No transfusion is indicated at  this time. Hemoglobin has normalized. 4. History of deep vein thrombosis. Previously anticoagulated with Coumadin. No new clots noted.  5. Hypertension seems to be under relatively good control.  6. Follow up. In about 1 month for cycle 6.     [name] 12/3/20133:05 PM

## 2012-05-26 NOTE — Telephone Encounter (Signed)
Per staff message and POF I have scheduled appts.  JMW  

## 2012-05-26 NOTE — Telephone Encounter (Signed)
appts made and mw to print for pt

## 2012-05-27 ENCOUNTER — Ambulatory Visit (HOSPITAL_BASED_OUTPATIENT_CLINIC_OR_DEPARTMENT_OTHER): Payer: Medicaid Other

## 2012-05-27 VITALS — BP 138/69 | HR 83 | Temp 99.0°F | Resp 20

## 2012-05-27 DIAGNOSIS — Z5112 Encounter for antineoplastic immunotherapy: Secondary | ICD-10-CM

## 2012-05-27 DIAGNOSIS — C9000 Multiple myeloma not having achieved remission: Secondary | ICD-10-CM

## 2012-05-27 MED ORDER — SODIUM CHLORIDE 0.9 % IV SOLN
Freq: Once | INTRAVENOUS | Status: AC
  Administered 2012-05-27: 15:00:00 via INTRAVENOUS

## 2012-05-27 MED ORDER — SODIUM CHLORIDE 0.9 % IJ SOLN
10.0000 mL | INTRAMUSCULAR | Status: DC | PRN
  Filled 2012-05-27: qty 10

## 2012-05-27 MED ORDER — HEPARIN SOD (PORK) LOCK FLUSH 100 UNIT/ML IV SOLN
500.0000 [IU] | Freq: Once | INTRAVENOUS | Status: DC | PRN
  Filled 2012-05-27: qty 5

## 2012-05-27 MED ORDER — ONDANSETRON 8 MG/50ML IVPB (CHCC)
8.0000 mg | Freq: Once | INTRAVENOUS | Status: AC
  Administered 2012-05-27: 8 mg via INTRAVENOUS

## 2012-05-27 MED ORDER — DEXTROSE 5 % IV SOLN
20.0000 mg/m2 | Freq: Once | INTRAVENOUS | Status: AC
  Administered 2012-05-27: 46 mg via INTRAVENOUS
  Filled 2012-05-27: qty 23

## 2012-05-27 MED ORDER — DEXAMETHASONE SODIUM PHOSPHATE 10 MG/ML IJ SOLN
10.0000 mg | Freq: Once | INTRAMUSCULAR | Status: AC
  Administered 2012-05-27: 10 mg via INTRAVENOUS

## 2012-05-27 NOTE — Patient Instructions (Signed)
[facility] Discharge Instructions for Patients Receiving Chemotherapy  Today you received the following chemotherapy agent: Kyprolis (Carfilzomib)  To help prevent nausea and vomiting after your treatment, we encourage you to take your nausea medications: Zofran and Compazine as needed Begin taking it at 1st sign of nausea and take it as often as prescribed.   If you develop nausea and vomiting that is not controlled by your nausea medication, call the clinic. If it is after clinic hours your family physician or the after hours number for the clinic or go to the Emergency Department.   BELOW ARE SYMPTOMS THAT SHOULD BE REPORTED IMMEDIATELY:  *FEVER GREATER THAN 100.5 F  *CHILLS WITH OR WITHOUT FEVER  NAUSEA AND VOMITING THAT IS NOT CONTROLLED WITH YOUR NAUSEA MEDICATION  *UNUSUAL SHORTNESS OF BREATH  *UNUSUAL BRUISING OR BLEEDING  TENDERNESS IN MOUTH AND THROAT WITH OR WITHOUT PRESENCE OF ULCERS  *URINARY PROBLEMS  *BOWEL PROBLEMS  UNUSUAL RASH Items with * indicate a potential emergency and should be followed up as soon as possible.   Feel free to call the clinic you have any questions or concerns. The clinic phone number is [phone].   I have been informed and understand all the instructions given to me. I know to contact the clinic, my physician, or go to the Emergency Department if any problems should occur. I do not have any questions at this time, but understand that I may call the clinic during office hours   should I have any questions or need assistance in obtaining follow up care.    __________________________________________  _____________  __________ Signature of Patient or Authorized Representative            Date                   Time    __________________________________________ Nurse's Signature

## 2012-06-02 ENCOUNTER — Other Ambulatory Visit: Payer: Self-pay | Admitting: *Deleted

## 2012-06-02 ENCOUNTER — Other Ambulatory Visit (HOSPITAL_BASED_OUTPATIENT_CLINIC_OR_DEPARTMENT_OTHER): Payer: Medicaid Other | Admitting: Lab

## 2012-06-02 ENCOUNTER — Ambulatory Visit (HOSPITAL_BASED_OUTPATIENT_CLINIC_OR_DEPARTMENT_OTHER): Payer: Medicaid Other

## 2012-06-02 DIAGNOSIS — C9000 Multiple myeloma not having achieved remission: Secondary | ICD-10-CM

## 2012-06-02 DIAGNOSIS — Z5112 Encounter for antineoplastic immunotherapy: Secondary | ICD-10-CM

## 2012-06-02 LAB — CBC WITH DIFFERENTIAL/PLATELET
BASO%: 0.4 % (ref 0.0–2.0)
LYMPH%: 33.8 % (ref 14.0–49.7)
MCHC: 32.4 g/dL (ref 31.5–36.0)
MCV: 92.4 fL (ref 79.5–101.0)
MONO%: 15.2 % — ABNORMAL HIGH (ref 0.0–14.0)
NEUT%: 49.3 % (ref 38.4–76.8)
Platelets: 135 10*3/uL — ABNORMAL LOW (ref 145–400)
RBC: 4.61 10*6/uL (ref 3.70–5.45)
nRBC: 0 % (ref 0–0)

## 2012-06-02 LAB — COMPREHENSIVE METABOLIC PANEL (CC13)
BUN: 21 mg/dL (ref 7.0–26.0)
CO2: 27 mEq/L (ref 22–29)
Creatinine: 1.3 mg/dL — ABNORMAL HIGH (ref 0.6–1.1)
Glucose: 69 mg/dl — ABNORMAL LOW (ref 70–99)
Total Bilirubin: 0.72 mg/dL (ref 0.20–1.20)

## 2012-06-02 MED ORDER — DEXTROSE 5 % IV SOLN
20.0000 mg/m2 | Freq: Once | INTRAVENOUS | Status: AC
  Administered 2012-06-02: 46 mg via INTRAVENOUS
  Filled 2012-06-02: qty 23

## 2012-06-02 MED ORDER — DOXYCYCLINE HYCLATE 100 MG PO TABS: 100.0000 mg | ORAL_TABLET | Freq: Two times a day (BID) | ORAL | Status: DC

## 2012-06-02 MED ORDER — SODIUM CHLORIDE 0.9 % IV SOLN: Freq: Once | INTRAVENOUS | Status: DC

## 2012-06-02 MED ORDER — DEXAMETHASONE SODIUM PHOSPHATE 10 MG/ML IJ SOLN
10.0000 mg | Freq: Once | INTRAMUSCULAR | Status: AC
  Administered 2012-06-02: 10 mg via INTRAVENOUS

## 2012-06-02 MED ORDER — ONDANSETRON 8 MG/50ML IVPB (CHCC)
8.0000 mg | Freq: Once | INTRAVENOUS | Status: AC
  Administered 2012-06-02: 8 mg via INTRAVENOUS

## 2012-06-02 MED ORDER — SODIUM CHLORIDE 0.9 % IJ SOLN
10.0000 mL | INTRAMUSCULAR | Status: DC | PRN
  Filled 2012-06-02: qty 10

## 2012-06-02 MED ORDER — HEPARIN SOD (PORK) LOCK FLUSH 100 UNIT/ML IV SOLN
500.0000 [IU] | Freq: Once | INTRAVENOUS | Status: DC | PRN
  Filled 2012-06-02: qty 5

## 2012-06-03 ENCOUNTER — Ambulatory Visit (HOSPITAL_BASED_OUTPATIENT_CLINIC_OR_DEPARTMENT_OTHER): Payer: Medicaid Other

## 2012-06-03 VITALS — BP 132/68 | HR 74 | Temp 98.1°F | Resp 20

## 2012-06-03 DIAGNOSIS — Z5112 Encounter for antineoplastic immunotherapy: Secondary | ICD-10-CM

## 2012-06-03 DIAGNOSIS — C9000 Multiple myeloma not having achieved remission: Secondary | ICD-10-CM

## 2012-06-03 MED ORDER — DEXAMETHASONE SODIUM PHOSPHATE 10 MG/ML IJ SOLN
10.0000 mg | Freq: Once | INTRAMUSCULAR | Status: AC
  Administered 2012-06-03: 10 mg via INTRAVENOUS

## 2012-06-03 MED ORDER — ONDANSETRON 8 MG/50ML IVPB (CHCC)
8.0000 mg | Freq: Once | INTRAVENOUS | Status: AC
  Administered 2012-06-03: 8 mg via INTRAVENOUS

## 2012-06-03 MED ORDER — SODIUM CHLORIDE 0.9 % IJ SOLN
10.0000 mL | INTRAMUSCULAR | Status: DC | PRN
  Administered 2012-06-03: 10 mL
  Filled 2012-06-03: qty 10

## 2012-06-03 MED ORDER — HEPARIN SOD (PORK) LOCK FLUSH 100 UNIT/ML IV SOLN
500.0000 [IU] | Freq: Once | INTRAVENOUS | Status: AC | PRN
  Administered 2012-06-03: 500 [IU]
  Filled 2012-06-03: qty 5

## 2012-06-03 MED ORDER — SODIUM CHLORIDE 0.9 % IV SOLN
Freq: Once | INTRAVENOUS | Status: AC
  Administered 2012-06-03 (×2): via INTRAVENOUS

## 2012-06-03 MED ORDER — DEXTROSE 5 % IV SOLN
20.0000 mg/m2 | Freq: Once | INTRAVENOUS | Status: AC
  Administered 2012-06-03: 46 mg via INTRAVENOUS
  Filled 2012-06-03: qty 23

## 2012-06-09 ENCOUNTER — Other Ambulatory Visit (HOSPITAL_BASED_OUTPATIENT_CLINIC_OR_DEPARTMENT_OTHER): Payer: Medicaid Other | Admitting: Lab

## 2012-06-09 ENCOUNTER — Ambulatory Visit (HOSPITAL_BASED_OUTPATIENT_CLINIC_OR_DEPARTMENT_OTHER): Payer: Medicaid Other

## 2012-06-09 VITALS — BP 130/88 | HR 67 | Temp 98.0°F | Resp 20

## 2012-06-09 DIAGNOSIS — C9000 Multiple myeloma not having achieved remission: Secondary | ICD-10-CM

## 2012-06-09 DIAGNOSIS — Z5112 Encounter for antineoplastic immunotherapy: Secondary | ICD-10-CM

## 2012-06-09 LAB — CBC WITH DIFFERENTIAL/PLATELET
EOS%: 1.2 % (ref 0.0–7.0)
MCH: 29.7 pg (ref 25.1–34.0)
MCHC: 32.7 g/dL (ref 31.5–36.0)
MCV: 90.9 fL (ref 79.5–101.0)
MONO%: 12.5 % (ref 0.0–14.0)
RBC: 4.74 10*6/uL (ref 3.70–5.45)
RDW: 15 % — ABNORMAL HIGH (ref 11.2–14.5)
nRBC: 0 % (ref 0–0)

## 2012-06-09 LAB — COMPREHENSIVE METABOLIC PANEL (CC13)
ALT: 17 U/L (ref 0–55)
Alkaline Phosphatase: 96 U/L (ref 40–150)
Sodium: 142 mEq/L (ref 136–145)
Total Bilirubin: 0.61 mg/dL (ref 0.20–1.20)
Total Protein: 6.6 g/dL (ref 6.4–8.3)

## 2012-06-09 MED ORDER — ONDANSETRON 8 MG/50ML IVPB (CHCC)
8.0000 mg | Freq: Once | INTRAVENOUS | Status: AC
  Administered 2012-06-09: 8 mg via INTRAVENOUS

## 2012-06-09 MED ORDER — DEXTROSE 5 % IV SOLN
20.0000 mg/m2 | Freq: Once | INTRAVENOUS | Status: AC
  Administered 2012-06-09: 46 mg via INTRAVENOUS
  Filled 2012-06-09: qty 23

## 2012-06-09 MED ORDER — SODIUM CHLORIDE 0.9 % IV SOLN: Freq: Once | INTRAVENOUS | Status: DC

## 2012-06-09 MED ORDER — SODIUM CHLORIDE 0.9 % IJ SOLN
10.0000 mL | INTRAMUSCULAR | Status: DC | PRN
  Administered 2012-06-09: 10 mL
  Filled 2012-06-09: qty 10

## 2012-06-09 MED ORDER — SODIUM CHLORIDE 0.9 % IV SOLN
Freq: Once | INTRAVENOUS | Status: AC
  Administered 2012-06-09: 16:00:00 via INTRAVENOUS

## 2012-06-09 MED ORDER — HEPARIN SOD (PORK) LOCK FLUSH 100 UNIT/ML IV SOLN
500.0000 [IU] | Freq: Once | INTRAVENOUS | Status: AC | PRN
  Administered 2012-06-09: 500 [IU]
  Filled 2012-06-09: qty 5

## 2012-06-09 MED ORDER — DEXAMETHASONE SODIUM PHOSPHATE 10 MG/ML IJ SOLN
10.0000 mg | Freq: Once | INTRAMUSCULAR | Status: AC
  Administered 2012-06-09: 10 mg via INTRAVENOUS

## 2012-06-09 NOTE — Progress Notes (Signed)
Reviewed CBC results, per Dr. Clelia Croft OK to treat.

## 2012-06-10 ENCOUNTER — Other Ambulatory Visit: Payer: Self-pay | Admitting: Oncology

## 2012-06-10 ENCOUNTER — Ambulatory Visit (HOSPITAL_BASED_OUTPATIENT_CLINIC_OR_DEPARTMENT_OTHER): Payer: Medicaid Other

## 2012-06-10 VITALS — BP 145/101 | HR 72 | Temp 98.7°F | Resp 18

## 2012-06-10 DIAGNOSIS — Z5112 Encounter for antineoplastic immunotherapy: Secondary | ICD-10-CM

## 2012-06-10 DIAGNOSIS — C9000 Multiple myeloma not having achieved remission: Secondary | ICD-10-CM

## 2012-06-10 MED ORDER — SODIUM CHLORIDE 0.9 % IV SOLN
Freq: Once | INTRAVENOUS | Status: AC
  Administered 2012-06-10: 15:00:00 via INTRAVENOUS

## 2012-06-10 MED ORDER — OXYCODONE-ACETAMINOPHEN 5-325 MG PO TABS
1.0000 | ORAL_TABLET | Freq: Once | ORAL | Status: AC
  Administered 2012-06-10: 1 via ORAL

## 2012-06-10 MED ORDER — HEPARIN SOD (PORK) LOCK FLUSH 100 UNIT/ML IV SOLN
500.0000 [IU] | Freq: Once | INTRAVENOUS | Status: AC | PRN
  Administered 2012-06-10: 500 [IU]
  Filled 2012-06-10: qty 5

## 2012-06-10 MED ORDER — DEXTROSE 5 % IV SOLN
20.0000 mg/m2 | Freq: Once | INTRAVENOUS | Status: AC
  Administered 2012-06-10: 46 mg via INTRAVENOUS
  Filled 2012-06-10: qty 23

## 2012-06-10 MED ORDER — SODIUM CHLORIDE 0.9 % IJ SOLN
10.0000 mL | INTRAMUSCULAR | Status: DC | PRN
  Administered 2012-06-10: 10 mL
  Filled 2012-06-10: qty 10

## 2012-06-10 MED ORDER — ONDANSETRON 8 MG/50ML IVPB (CHCC)
8.0000 mg | Freq: Once | INTRAVENOUS | Status: AC
  Administered 2012-06-10: 8 mg via INTRAVENOUS

## 2012-06-10 MED ORDER — DEXAMETHASONE SODIUM PHOSPHATE 10 MG/ML IJ SOLN
10.0000 mg | Freq: Once | INTRAMUSCULAR | Status: AC
  Administered 2012-06-10: 10 mg via INTRAVENOUS

## 2012-06-10 NOTE — Patient Instructions (Addendum)
[facility] Discharge Instructions for Patients Receiving Chemotherapy  Today you received the following chemotherapy agents Kyprolis.  To help prevent nausea and vomiting after your treatment, we encourage you to take your nausea medication as prescribed.   If you develop nausea and vomiting that is not controlled by your nausea medication, call the clinic. If it is after clinic hours your family physician or the after hours number for the clinic or go to the Emergency Department.   BELOW ARE SYMPTOMS THAT SHOULD BE REPORTED IMMEDIATELY:  *FEVER GREATER THAN 100.5 F  *CHILLS WITH OR WITHOUT FEVER  NAUSEA AND VOMITING THAT IS NOT CONTROLLED WITH YOUR NAUSEA MEDICATION  *UNUSUAL SHORTNESS OF BREATH  *UNUSUAL BRUISING OR BLEEDING  TENDERNESS IN MOUTH AND THROAT WITH OR WITHOUT PRESENCE OF ULCERS  *URINARY PROBLEMS  *BOWEL PROBLEMS  UNUSUAL RASH Items with * indicate a potential emergency and should be followed up as soon as possible.  Feel free to call the clinic you have any questions or concerns. The clinic phone number is [phone].   I have been informed and understand all the instructions given to me. I know to contact the clinic, my physician, or go to the Emergency Department if any problems should occur. I do not have any questions at this time, but understand that I may call the clinic during office hours   should I have any questions or need assistance in obtaining follow up care.    __________________________________________  _____________  __________ Signature of Patient or Authorized Representative            Date                   Time    __________________________________________ Nurse's Signature    

## 2012-06-23 ENCOUNTER — Other Ambulatory Visit: Payer: Self-pay | Admitting: Oncology

## 2012-06-23 ENCOUNTER — Other Ambulatory Visit (HOSPITAL_BASED_OUTPATIENT_CLINIC_OR_DEPARTMENT_OTHER): Payer: Medicaid Other | Admitting: Lab

## 2012-06-23 ENCOUNTER — Ambulatory Visit (HOSPITAL_BASED_OUTPATIENT_CLINIC_OR_DEPARTMENT_OTHER): Payer: Medicaid Other

## 2012-06-23 DIAGNOSIS — C9000 Multiple myeloma not having achieved remission: Secondary | ICD-10-CM

## 2012-06-23 DIAGNOSIS — Z5112 Encounter for antineoplastic immunotherapy: Secondary | ICD-10-CM

## 2012-06-23 LAB — COMPREHENSIVE METABOLIC PANEL (CC13)
AST: 17 U/L (ref 5–34)
Albumin: 3.6 g/dL (ref 3.5–5.0)
Alkaline Phosphatase: 104 U/L (ref 40–150)
Glucose: 58 mg/dl — ABNORMAL LOW (ref 70–99)
Potassium: 3.7 mEq/L (ref 3.5–5.1)
Sodium: 149 mEq/L — ABNORMAL HIGH (ref 136–145)
Total Protein: 6.2 g/dL — ABNORMAL LOW (ref 6.4–8.3)

## 2012-06-23 LAB — CBC WITH DIFFERENTIAL/PLATELET
Eosinophils Absolute: 0 10*3/uL (ref 0.0–0.5)
MCV: 93.5 fL (ref 79.5–101.0)
MONO%: 14 % (ref 0.0–14.0)
NEUT#: 1.4 10*3/uL — ABNORMAL LOW (ref 1.5–6.5)
RBC: 4.61 10*6/uL (ref 3.70–5.45)
RDW: 15.6 % — ABNORMAL HIGH (ref 11.2–14.5)
WBC: 2.5 10*3/uL — ABNORMAL LOW (ref 3.9–10.3)

## 2012-06-23 MED ORDER — HEPARIN SOD (PORK) LOCK FLUSH 100 UNIT/ML IV SOLN
500.0000 [IU] | Freq: Once | INTRAVENOUS | Status: AC | PRN
  Administered 2012-06-23: 500 [IU]
  Filled 2012-06-23: qty 5

## 2012-06-23 MED ORDER — SODIUM CHLORIDE 0.9 % IJ SOLN
10.0000 mL | INTRAMUSCULAR | Status: DC | PRN
  Administered 2012-06-23: 10 mL
  Filled 2012-06-23: qty 10

## 2012-06-23 MED ORDER — SODIUM CHLORIDE 0.9 % IV SOLN
Freq: Once | INTRAVENOUS | Status: AC
  Administered 2012-06-23: 15:00:00 via INTRAVENOUS

## 2012-06-23 MED ORDER — DEXAMETHASONE SODIUM PHOSPHATE 10 MG/ML IJ SOLN
10.0000 mg | Freq: Once | INTRAMUSCULAR | Status: AC
  Administered 2012-06-23: 10 mg via INTRAVENOUS

## 2012-06-23 MED ORDER — DEXTROSE 5 % IV SOLN
20.0000 mg/m2 | Freq: Once | INTRAVENOUS | Status: AC
  Administered 2012-06-23: 46 mg via INTRAVENOUS
  Filled 2012-06-23: qty 23

## 2012-06-23 MED ORDER — BENZONATATE 100 MG PO CAPS: 100.0000 mg | ORAL_CAPSULE | Freq: Three times a day (TID) | ORAL | Status: DC | PRN

## 2012-06-23 MED ORDER — LEVOFLOXACIN 500 MG PO TABS: 500.0000 mg | ORAL_TABLET | Freq: Every day | ORAL | Status: DC

## 2012-06-23 MED ORDER — ONDANSETRON 8 MG/50ML IVPB (CHCC)
8.0000 mg | Freq: Once | INTRAVENOUS | Status: AC
  Administered 2012-06-23: 8 mg via INTRAVENOUS

## 2012-06-23 NOTE — Patient Instructions (Signed)
Patient aware of next appointment; discharged home with no complaints. 

## 2012-06-24 ENCOUNTER — Ambulatory Visit: Payer: Medicaid Other

## 2012-06-25 ENCOUNTER — Ambulatory Visit (HOSPITAL_BASED_OUTPATIENT_CLINIC_OR_DEPARTMENT_OTHER): Payer: Medicaid Other

## 2012-06-25 VITALS — BP 144/94 | HR 77 | Resp 20

## 2012-06-25 DIAGNOSIS — Z5112 Encounter for antineoplastic immunotherapy: Secondary | ICD-10-CM

## 2012-06-25 DIAGNOSIS — C9000 Multiple myeloma not having achieved remission: Secondary | ICD-10-CM

## 2012-06-25 MED ORDER — DEXAMETHASONE SODIUM PHOSPHATE 10 MG/ML IJ SOLN
10.0000 mg | Freq: Once | INTRAMUSCULAR | Status: AC
  Administered 2012-06-25: 10 mg via INTRAVENOUS

## 2012-06-25 MED ORDER — DEXTROSE 5 % IV SOLN
20.0000 mg/m2 | Freq: Once | INTRAVENOUS | Status: AC
  Administered 2012-06-25: 46 mg via INTRAVENOUS
  Filled 2012-06-25: qty 23

## 2012-06-25 MED ORDER — ONDANSETRON 8 MG/50ML IVPB (CHCC)
8.0000 mg | Freq: Once | INTRAVENOUS | Status: AC
  Administered 2012-06-25: 8 mg via INTRAVENOUS

## 2012-06-25 NOTE — Patient Instructions (Addendum)
[facility] Discharge Instructions for Patients Receiving Chemotherapy  Today you received the following chemotherapy agents: Kyprolis. To help prevent nausea and vomiting after your treatment, we encourage you to take your nausea medication.    If you develop nausea and vomiting that is not controlled by your nausea medication, call the clinic. If it is after clinic hours your family physician or the after hours number for the clinic or go to the Emergency Department.   BELOW ARE SYMPTOMS THAT SHOULD BE REPORTED IMMEDIATELY:  *FEVER GREATER THAN 100.5 F  *CHILLS WITH OR WITHOUT FEVER  NAUSEA AND VOMITING THAT IS NOT CONTROLLED WITH YOUR NAUSEA MEDICATION  *UNUSUAL SHORTNESS OF BREATH  *UNUSUAL BRUISING OR BLEEDING  TENDERNESS IN MOUTH AND THROAT WITH OR WITHOUT PRESENCE OF ULCERS  *URINARY PROBLEMS  *BOWEL PROBLEMS  UNUSUAL RASH Items with * indicate a potential emergency and should be followed up as soon as possible.  Feel free to call the clinic you have any questions or concerns. The clinic phone number is [phone].

## 2012-06-29 LAB — SPEP & IFE WITH QIG
Albumin ELP: 64 % (ref 55.8–66.1)
Alpha-1-Globulin: 6 % — ABNORMAL HIGH (ref 2.9–4.9)
Alpha-2-Globulin: 11.8 % (ref 7.1–11.8)
Beta 2: 2.7 % — ABNORMAL LOW (ref 3.2–6.5)
Beta Globulin: 6.5 % (ref 4.7–7.2)
IgA: 13 mg/dL — ABNORMAL LOW (ref 69–380)

## 2012-06-29 LAB — KAPPA/LAMBDA LIGHT CHAINS: Kappa:Lambda Ratio: 358.06 — ABNORMAL HIGH (ref 0.26–1.65)

## 2012-06-30 ENCOUNTER — Other Ambulatory Visit: Payer: Self-pay | Admitting: *Deleted

## 2012-06-30 ENCOUNTER — Other Ambulatory Visit (HOSPITAL_BASED_OUTPATIENT_CLINIC_OR_DEPARTMENT_OTHER): Payer: Medicaid Other | Admitting: Lab

## 2012-06-30 ENCOUNTER — Ambulatory Visit (HOSPITAL_BASED_OUTPATIENT_CLINIC_OR_DEPARTMENT_OTHER): Payer: Medicaid Other

## 2012-06-30 VITALS — BP 136/85 | HR 79 | Temp 98.9°F | Resp 20

## 2012-06-30 DIAGNOSIS — Z5112 Encounter for antineoplastic immunotherapy: Secondary | ICD-10-CM

## 2012-06-30 DIAGNOSIS — C9000 Multiple myeloma not having achieved remission: Secondary | ICD-10-CM

## 2012-06-30 LAB — CBC WITH DIFFERENTIAL/PLATELET
BASO%: 0.4 % (ref 0.0–2.0)
Basophils Absolute: 0 10*3/uL (ref 0.0–0.1)
EOS%: 2.7 % (ref 0.0–7.0)
HGB: 13.5 g/dL (ref 11.6–15.9)
MCH: 29.5 pg (ref 25.1–34.0)
MCHC: 33.1 g/dL (ref 31.5–36.0)
MCV: 89.3 fL (ref 79.5–101.0)
MONO%: 10.7 % (ref 0.0–14.0)
RBC: 4.57 10*6/uL (ref 3.70–5.45)
RDW: 15.4 % — ABNORMAL HIGH (ref 11.2–14.5)
lymph#: 1.1 10*3/uL (ref 0.9–3.3)
nRBC: 2 % — ABNORMAL HIGH (ref 0–0)

## 2012-06-30 LAB — COMPREHENSIVE METABOLIC PANEL (CC13)
ALT: 10 U/L (ref 0–55)
AST: 16 U/L (ref 5–34)
Albumin: 3.4 g/dL — ABNORMAL LOW (ref 3.5–5.0)
Alkaline Phosphatase: 95 U/L (ref 40–150)
Glucose: 83 mg/dl (ref 70–99)
Potassium: 3.7 mEq/L (ref 3.5–5.1)
Sodium: 145 mEq/L (ref 136–145)
Total Bilirubin: 0.65 mg/dL (ref 0.20–1.20)
Total Protein: 6.3 g/dL — ABNORMAL LOW (ref 6.4–8.3)

## 2012-06-30 MED ORDER — SODIUM CHLORIDE 0.9 % IV SOLN
Freq: Once | INTRAVENOUS | Status: AC
  Administered 2012-06-30: 17:00:00 via INTRAVENOUS

## 2012-06-30 MED ORDER — HEPARIN SOD (PORK) LOCK FLUSH 100 UNIT/ML IV SOLN
500.0000 [IU] | Freq: Once | INTRAVENOUS | Status: AC | PRN
  Administered 2012-06-30: 500 [IU]
  Filled 2012-06-30: qty 5

## 2012-06-30 MED ORDER — ONDANSETRON 8 MG/50ML IVPB (CHCC)
8.0000 mg | Freq: Once | INTRAVENOUS | Status: AC
  Administered 2012-06-30: 8 mg via INTRAVENOUS

## 2012-06-30 MED ORDER — SODIUM CHLORIDE 0.9 % IV SOLN
Freq: Once | INTRAVENOUS | Status: AC
  Administered 2012-06-30: 16:00:00 via INTRAVENOUS

## 2012-06-30 MED ORDER — DEXAMETHASONE SODIUM PHOSPHATE 10 MG/ML IJ SOLN
10.0000 mg | Freq: Once | INTRAMUSCULAR | Status: AC
  Administered 2012-06-30: 10 mg via INTRAVENOUS

## 2012-06-30 MED ORDER — DEXTROSE 5 % IV SOLN
20.0000 mg/m2 | Freq: Once | INTRAVENOUS | Status: AC
  Administered 2012-06-30: 46 mg via INTRAVENOUS
  Filled 2012-06-30: qty 23

## 2012-06-30 MED ORDER — SODIUM CHLORIDE 0.9 % IJ SOLN
10.0000 mL | INTRAMUSCULAR | Status: DC | PRN
  Administered 2012-06-30: 10 mL
  Filled 2012-06-30: qty 10

## 2012-06-30 NOTE — Patient Instructions (Signed)
Patient aware of next appointment; discharged home with no complaints. 

## 2012-06-30 NOTE — Telephone Encounter (Signed)
Per Dr. Candy Sledge not refill doxycycline

## 2012-07-01 ENCOUNTER — Ambulatory Visit (HOSPITAL_BASED_OUTPATIENT_CLINIC_OR_DEPARTMENT_OTHER): Payer: Medicaid Other

## 2012-07-01 ENCOUNTER — Other Ambulatory Visit: Payer: Self-pay | Admitting: *Deleted

## 2012-07-01 ENCOUNTER — Other Ambulatory Visit: Payer: Self-pay | Admitting: Oncology

## 2012-07-01 VITALS — BP 149/90 | HR 74 | Temp 98.9°F

## 2012-07-01 DIAGNOSIS — C9000 Multiple myeloma not having achieved remission: Secondary | ICD-10-CM

## 2012-07-01 DIAGNOSIS — Z5112 Encounter for antineoplastic immunotherapy: Secondary | ICD-10-CM

## 2012-07-01 MED ORDER — DEXTROSE 5 % IV SOLN
20.0000 mg/m2 | Freq: Once | INTRAVENOUS | Status: AC
  Administered 2012-07-01: 46 mg via INTRAVENOUS
  Filled 2012-07-01: qty 23

## 2012-07-01 MED ORDER — ONDANSETRON 8 MG/50ML IVPB (CHCC)
8.0000 mg | Freq: Once | INTRAVENOUS | Status: AC
  Administered 2012-07-01: 8 mg via INTRAVENOUS

## 2012-07-01 MED ORDER — SODIUM CHLORIDE 0.9 % IV SOLN: Freq: Once | INTRAVENOUS | Status: DC

## 2012-07-01 MED ORDER — HEPARIN SOD (PORK) LOCK FLUSH 100 UNIT/ML IV SOLN
500.0000 [IU] | Freq: Once | INTRAVENOUS | Status: AC | PRN
  Administered 2012-07-01: 500 [IU]
  Filled 2012-07-01: qty 5

## 2012-07-01 MED ORDER — DEXAMETHASONE SODIUM PHOSPHATE 10 MG/ML IJ SOLN
10.0000 mg | Freq: Once | INTRAMUSCULAR | Status: AC
  Administered 2012-07-01: 10 mg via INTRAVENOUS

## 2012-07-01 MED ORDER — SODIUM CHLORIDE 0.9 % IV SOLN
Freq: Once | INTRAVENOUS | Status: AC
  Administered 2012-07-01: 16:00:00 via INTRAVENOUS

## 2012-07-01 MED ORDER — SODIUM CHLORIDE 0.9 % IJ SOLN
10.0000 mL | INTRAMUSCULAR | Status: DC | PRN
  Administered 2012-07-01: 10 mL
  Filled 2012-07-01: qty 10

## 2012-07-01 MED ORDER — AMOXICILLIN-POT CLAVULANATE 875-125 MG PO TABS: 1.0000 | ORAL_TABLET | Freq: Two times a day (BID) | ORAL | Status: DC

## 2012-07-01 NOTE — Patient Instructions (Signed)
[facility] Discharge Instructions for Patients Receiving Chemotherapy  Today you received the following chemotherapy agents Kyprolis  To help prevent nausea and vomiting after your treatment, we encourage you to take your nausea medication as prescribed.   If you develop nausea and vomiting that is not controlled by your nausea medication, call the clinic. If it is after clinic hours your family physician or the after hours number for the clinic or go to the Emergency Department.   BELOW ARE SYMPTOMS THAT SHOULD BE REPORTED IMMEDIATELY:  *FEVER GREATER THAN 100.5 F  *CHILLS WITH OR WITHOUT FEVER  NAUSEA AND VOMITING THAT IS NOT CONTROLLED WITH YOUR NAUSEA MEDICATION  *UNUSUAL SHORTNESS OF BREATH  *UNUSUAL BRUISING OR BLEEDING  TENDERNESS IN MOUTH AND THROAT WITH OR WITHOUT PRESENCE OF ULCERS  *URINARY PROBLEMS  *BOWEL PROBLEMS  UNUSUAL RASH Items with * indicate a potential emergency and should be followed up as soon as possible.  Feel free to call the clinic you have any questions or concerns. The clinic phone number is [phone].   I have been informed and understand all the instructions given to me. I know to contact the clinic, my physician, or go to the Emergency Department if any problems should occur. I do not have any questions at this time, but understand that I may call the clinic during office hours   should I have any questions or need assistance in obtaining follow up care.    

## 2012-07-01 NOTE — Progress Notes (Signed)
1525 Labs reviewed by Dr. Jannette Fogo to treat today despite 06/30/12 labs.

## 2012-07-07 ENCOUNTER — Other Ambulatory Visit: Payer: Self-pay | Admitting: *Deleted

## 2012-07-07 ENCOUNTER — Other Ambulatory Visit (HOSPITAL_BASED_OUTPATIENT_CLINIC_OR_DEPARTMENT_OTHER): Payer: Medicaid Other | Admitting: Lab

## 2012-07-07 ENCOUNTER — Ambulatory Visit (HOSPITAL_BASED_OUTPATIENT_CLINIC_OR_DEPARTMENT_OTHER): Payer: Medicaid Other

## 2012-07-07 DIAGNOSIS — Z5112 Encounter for antineoplastic immunotherapy: Secondary | ICD-10-CM

## 2012-07-07 DIAGNOSIS — C9000 Multiple myeloma not having achieved remission: Secondary | ICD-10-CM

## 2012-07-07 DIAGNOSIS — C9001 Multiple myeloma in remission: Secondary | ICD-10-CM

## 2012-07-07 LAB — CBC WITH DIFFERENTIAL/PLATELET
BASO%: 0.7 % (ref 0.0–2.0)
EOS%: 0.8 % (ref 0.0–7.0)
HCT: 42.3 % (ref 34.8–46.6)
MCH: 29.6 pg (ref 25.1–34.0)
MCHC: 32.6 g/dL (ref 31.5–36.0)
MONO%: 13.5 % (ref 0.0–14.0)
NEUT#: 1.7 10*3/uL (ref 1.5–6.5)
NEUT%: 56.3 % (ref 38.4–76.8)
WBC: 3.1 10*3/uL — ABNORMAL LOW (ref 3.9–10.3)
lymph#: 0.9 10*3/uL (ref 0.9–3.3)

## 2012-07-07 LAB — COMPREHENSIVE METABOLIC PANEL (CC13)
ALT: 12 U/L (ref 0–55)
AST: 18 U/L (ref 5–34)
Alkaline Phosphatase: 88 U/L (ref 40–150)
Calcium: 8.8 mg/dL (ref 8.4–10.4)
Chloride: 113 mEq/L — ABNORMAL HIGH (ref 98–107)
Creatinine: 1 mg/dL (ref 0.6–1.1)
Total Bilirubin: 0.69 mg/dL (ref 0.20–1.20)

## 2012-07-07 MED ORDER — WARFARIN SODIUM 5 MG PO TABS: 5.0000 mg | ORAL_TABLET | Freq: Every day | ORAL | Status: DC

## 2012-07-07 MED ORDER — ATENOLOL 25 MG PO TABS: 25.0000 mg | ORAL_TABLET | Freq: Every day | ORAL | Status: DC

## 2012-07-07 MED ORDER — SODIUM CHLORIDE 0.9 % IV SOLN: Freq: Once | INTRAVENOUS | Status: DC

## 2012-07-07 MED ORDER — SODIUM CHLORIDE 0.9 % IJ SOLN
10.0000 mL | INTRAMUSCULAR | Status: DC | PRN
  Administered 2012-07-07: 10 mL
  Filled 2012-07-07: qty 10

## 2012-07-07 MED ORDER — DEXAMETHASONE SODIUM PHOSPHATE 10 MG/ML IJ SOLN
10.0000 mg | Freq: Once | INTRAMUSCULAR | Status: AC
  Administered 2012-07-07: 10 mg via INTRAVENOUS

## 2012-07-07 MED ORDER — AMLODIPINE BESYLATE 5 MG PO TABS: 5.0000 mg | ORAL_TABLET | Freq: Every day | ORAL | Status: DC

## 2012-07-07 MED ORDER — PRAVASTATIN SODIUM 40 MG PO TABS: 40.0000 mg | ORAL_TABLET | Freq: Every day | ORAL | Status: DC

## 2012-07-07 MED ORDER — HEPARIN SOD (PORK) LOCK FLUSH 100 UNIT/ML IV SOLN
500.0000 [IU] | Freq: Once | INTRAVENOUS | Status: AC | PRN
  Administered 2012-07-07: 500 [IU]
  Filled 2012-07-07: qty 5

## 2012-07-07 MED ORDER — RANITIDINE HCL 150 MG PO TABS: 150.0000 mg | ORAL_TABLET | Freq: Two times a day (BID) | ORAL | Status: DC

## 2012-07-07 MED ORDER — OXYBUTYNIN CHLORIDE 5 MG PO TABS: 5.0000 mg | ORAL_TABLET | Freq: Every day | ORAL | Status: DC

## 2012-07-07 MED ORDER — FUROSEMIDE 40 MG PO TABS: 40.0000 mg | ORAL_TABLET | Freq: Two times a day (BID) | ORAL | Status: DC

## 2012-07-07 MED ORDER — ONDANSETRON 8 MG/50ML IVPB (CHCC)
8.0000 mg | Freq: Once | INTRAVENOUS | Status: AC
  Administered 2012-07-07: 8 mg via INTRAVENOUS

## 2012-07-07 MED ORDER — SODIUM CHLORIDE 0.9 % IV SOLN
Freq: Once | INTRAVENOUS | Status: AC
  Administered 2012-07-07: 15:00:00 via INTRAVENOUS

## 2012-07-07 MED ORDER — DEXTROSE 5 % IV SOLN
20.0000 mg/m2 | Freq: Once | INTRAVENOUS | Status: AC
  Administered 2012-07-07: 46 mg via INTRAVENOUS
  Filled 2012-07-07: qty 23

## 2012-07-07 MED ORDER — BUPROPION HCL ER (XL) 150 MG PO TB24: 150.0000 mg | ORAL_TABLET | Freq: Every day | ORAL | Status: DC

## 2012-07-07 MED ORDER — TEMAZEPAM 15 MG PO CAPS: 15.0000 mg | ORAL_CAPSULE | Freq: Every evening | ORAL | Status: AC | PRN

## 2012-07-07 NOTE — Telephone Encounter (Signed)
Patient has appt with new pcp on feb 2nd. Refilled all of her necessary medications. #20 of each to last until she sees her new pcp. Gave her a medication list she is to present, so that they may be prescribed for her.

## 2012-07-07 NOTE — Patient Instructions (Signed)
[facility] Discharge Instructions for Patients Receiving Chemotherapy  Today you received the following chemotherapy agents Kyprolis.  To help prevent nausea and vomiting after your treatment, we encourage you to take your nausea medication as prescribed.   If you develop nausea and vomiting that is not controlled by your nausea medication, call the clinic. If it is after clinic hours your family physician or the after hours number for the clinic or go to the Emergency Department.   BELOW ARE SYMPTOMS THAT SHOULD BE REPORTED IMMEDIATELY:  *FEVER GREATER THAN 100.5 F  *CHILLS WITH OR WITHOUT FEVER  NAUSEA AND VOMITING THAT IS NOT CONTROLLED WITH YOUR NAUSEA MEDICATION  *UNUSUAL SHORTNESS OF BREATH  *UNUSUAL BRUISING OR BLEEDING  TENDERNESS IN MOUTH AND THROAT WITH OR WITHOUT PRESENCE OF ULCERS  *URINARY PROBLEMS  *BOWEL PROBLEMS  UNUSUAL RASH Items with * indicate a potential emergency and should be followed up as soon as possible.  One of the nurses will contact you 24 hours after your treatment. Please let the nurse know about any problems that you may have experienced. Feel free to call the clinic you have any questions or concerns. The clinic phone number is [phone].

## 2012-07-07 NOTE — Addendum Note (Signed)
Addended by: Reesa Chew on: 07/07/2012 04:43 PM   Modules accepted: Orders

## 2012-07-08 ENCOUNTER — Telehealth: Payer: Self-pay | Admitting: *Deleted

## 2012-07-08 ENCOUNTER — Ambulatory Visit (HOSPITAL_BASED_OUTPATIENT_CLINIC_OR_DEPARTMENT_OTHER): Payer: Medicaid Other

## 2012-07-08 VITALS — BP 121/83 | HR 72 | Temp 98.0°F

## 2012-07-08 DIAGNOSIS — C9000 Multiple myeloma not having achieved remission: Secondary | ICD-10-CM

## 2012-07-08 DIAGNOSIS — Z5112 Encounter for antineoplastic immunotherapy: Secondary | ICD-10-CM

## 2012-07-08 MED ORDER — SODIUM CHLORIDE 0.9 % IV SOLN
Freq: Once | INTRAVENOUS | Status: AC
  Administered 2012-07-08: 14:00:00 via INTRAVENOUS

## 2012-07-08 MED ORDER — SODIUM CHLORIDE 0.9 % IJ SOLN
10.0000 mL | INTRAMUSCULAR | Status: DC | PRN
  Administered 2012-07-08: 10 mL
  Filled 2012-07-08: qty 10

## 2012-07-08 MED ORDER — DEXTROSE 5 % IV SOLN
20.0000 mg/m2 | Freq: Once | INTRAVENOUS | Status: AC
  Administered 2012-07-08: 46 mg via INTRAVENOUS
  Filled 2012-07-08: qty 23

## 2012-07-08 MED ORDER — SODIUM CHLORIDE 0.9 % IV SOLN: Freq: Once | INTRAVENOUS | Status: DC

## 2012-07-08 MED ORDER — HEPARIN SOD (PORK) LOCK FLUSH 100 UNIT/ML IV SOLN
500.0000 [IU] | Freq: Once | INTRAVENOUS | Status: AC | PRN
  Administered 2012-07-08: 500 [IU]
  Filled 2012-07-08: qty 5

## 2012-07-08 MED ORDER — ONDANSETRON 8 MG/50ML IVPB (CHCC)
8.0000 mg | Freq: Once | INTRAVENOUS | Status: AC
  Administered 2012-07-08: 8 mg via INTRAVENOUS

## 2012-07-08 MED ORDER — OXYCODONE-ACETAMINOPHEN 5-325 MG PO TABS: 1.0000 | ORAL_TABLET | ORAL | Status: DC | PRN

## 2012-07-08 MED ORDER — DEXAMETHASONE SODIUM PHOSPHATE 10 MG/ML IJ SOLN
10.0000 mg | Freq: Once | INTRAMUSCULAR | Status: AC
  Administered 2012-07-08: 10 mg via INTRAVENOUS

## 2012-07-08 MED ORDER — OXYCODONE-ACETAMINOPHEN 5-325 MG PO TABS
2.0000 | ORAL_TABLET | Freq: Once | ORAL | Status: AC
  Administered 2012-07-08: 2 via ORAL

## 2012-07-08 NOTE — Progress Notes (Signed)
Patient complains of pain in her right hip. Patient states the pain has been constant since yesterday. Patient rates the pain on a level 8 on a scale of 0-10. Order given and carried out for percocet 5/325mg  per Dr. Clelia Croft.

## 2012-07-08 NOTE — Telephone Encounter (Signed)
Pt requested to change pain medication to Percocet from OxyIR. OK, per Dr. Clelia Croft. Rx given.--TCW

## 2012-07-08 NOTE — Patient Instructions (Signed)
[facility] Discharge Instructions for Patients Receiving Chemotherapy  Today you received the following chemotherapy agents Kyprolis To help prevent nausea and vomiting after your treatment, we encourage you to take your nausea medication as prescribed.  If you develop nausea and vomiting that is not controlled by your nausea medication, call the clinic. If it is after clinic hours your family physician or the after hours number for the clinic or go to the Emergency Department.   BELOW ARE SYMPTOMS THAT SHOULD BE REPORTED IMMEDIATELY:  *FEVER GREATER THAN 100.5 F  *CHILLS WITH OR WITHOUT FEVER  NAUSEA AND VOMITING THAT IS NOT CONTROLLED WITH YOUR NAUSEA MEDICATION  *UNUSUAL SHORTNESS OF BREATH  *UNUSUAL BRUISING OR BLEEDING  TENDERNESS IN MOUTH AND THROAT WITH OR WITHOUT PRESENCE OF ULCERS  *URINARY PROBLEMS  *BOWEL PROBLEMS  UNUSUAL RASH Items with * indicate a potential emergency and should be followed up as soon as possible.  One of the nurses will contact you 24 hours after your treatment. Please let the nurse know about any problems that you may have experienced. Feel free to call the clinic you have any questions or concerns. The clinic phone number is [phone].   I have been informed and understand all the instructions given to me. I know to contact the clinic, my physician, or go to the Emergency Department if any problems should occur. I do not have any questions at this time, but understand that I may call the clinic during office hours   should I have any questions or need assistance in obtaining follow up care.    __________________________________________  _____________  __________ Signature of Patient or Authorized Representative            Date                   Time    __________________________________________ Nurse's Signature    

## 2012-07-21 ENCOUNTER — Encounter: Payer: Self-pay | Admitting: Oncology

## 2012-07-21 ENCOUNTER — Ambulatory Visit (HOSPITAL_BASED_OUTPATIENT_CLINIC_OR_DEPARTMENT_OTHER): Payer: Medicaid Other | Admitting: Oncology

## 2012-07-21 ENCOUNTER — Other Ambulatory Visit (HOSPITAL_BASED_OUTPATIENT_CLINIC_OR_DEPARTMENT_OTHER): Payer: Medicaid Other | Admitting: Lab

## 2012-07-21 VITALS — BP 148/78 | HR 58 | Temp 98.1°F | Resp 18 | Ht 69.0 in | Wt 264.9 lb

## 2012-07-21 DIAGNOSIS — C9000 Multiple myeloma not having achieved remission: Secondary | ICD-10-CM

## 2012-07-21 DIAGNOSIS — Z86718 Personal history of other venous thrombosis and embolism: Secondary | ICD-10-CM

## 2012-07-21 LAB — CBC WITH DIFFERENTIAL/PLATELET
Basophils Absolute: 0 10*3/uL (ref 0.0–0.1)
EOS%: 1.2 % (ref 0.0–7.0)
Eosinophils Absolute: 0 10*3/uL (ref 0.0–0.5)
HGB: 13.7 g/dL (ref 11.6–15.9)
LYMPH%: 34.1 % (ref 14.0–49.7)
MCH: 28.9 pg (ref 25.1–34.0)
MCV: 90.7 fL (ref 79.5–101.0)
MONO%: 11.7 % (ref 0.0–14.0)
NEUT#: 1.4 10*3/uL — ABNORMAL LOW (ref 1.5–6.5)
Platelets: 144 10*3/uL — ABNORMAL LOW (ref 145–400)
RBC: 4.74 10*6/uL (ref 3.70–5.45)
RDW: 15.2 % — ABNORMAL HIGH (ref 11.2–14.5)

## 2012-07-21 LAB — COMPREHENSIVE METABOLIC PANEL (CC13)
AST: 17 U/L (ref 5–34)
Alkaline Phosphatase: 86 U/L (ref 40–150)
BUN: 18.4 mg/dL (ref 7.0–26.0)
Glucose: 81 mg/dl (ref 70–99)
Total Bilirubin: 0.7 mg/dL (ref 0.20–1.20)

## 2012-07-21 MED ORDER — OXYCODONE-ACETAMINOPHEN 5-325 MG PO TABS: 1.0000 | ORAL_TABLET | ORAL | Status: DC | PRN

## 2012-07-21 NOTE — Progress Notes (Signed)
Hematology and Oncology Follow Up Visit  [name] [id number] [birth year] 62 y.o. 07/21/2012 4:59 PM      Principle Diagnosis:  This is a 61 year old female with the following issues:   1. 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  She had a free kappa light chain around 1500. 2. Diagnosis of deep vein thrombosis in July 2011.   Prior Therapy:    1. Initially treated with melphalan and prednisone.  She had a partial response with free light chains down as low as 19, M spike down to 0.52 g/dL.  2. Patient treated with Revlimid at 25 mg 3 weeks on and 1 week off between November 2009 until November 2010.  She had a partial response, light chains down to 33 and M spike was not detected.   3. She was treated on maintenance Revlimid 15 mg to maintain her partial response. 4. She was on Zometa intermittently, on hold know. 5. Currently anticoagulated with Coumadin 7.5 mg since July 2011. 6.     She received Velcade 1.3 mg/sq m for a total of 2.9 mg weekly, which started on 02/21/2011 with weekly dexamethasone .  Treatment has been on hold due to recurrent hospitalization for wound infection. Treatment restarted in in 06/2011 till 09/2011. 7. Kyprolis started on 02/04/12. She completed 6 months of therapy on 07/08/12  Current therapy: Watchful observation  Interim History:  [name] presents today for an office followup visit. This is a pleasant 62 year old female with a few comorbid conditions as outlined above. She has tolerated Kyprolis without complications at this time. She is reporting pain in both hips radiating to the back of her thighs which has improved significantly.  Using pain meds about 1-2 times per day. No neurological symptoms. She is able to ambulate and actually pain is relieved by stretching and ambulation. She takes Oxycodone which helps.  She does not report any obvious bleeding  such as epistaxis, gum bleeding, hemoptysis, melena, hematochezia or hematuria.  She does have some intermittent swelling in her right lower extremity which is improved now.  She does not report any headaches, visual changes, any dysphagia, odynophagia, any nausea, vomiting, diarrhea, constipation, chest pain, shortness of breath, productive cough, abdominal pain, abdominal swelling, lower extremity paresthesias, bowel or bladder incontinence.  She denies any fevers, chills or night sweats. Patient's husband died 2 days ago due to end-stage dementia.  Medications: I have reviewed the patient's current medications.  Allergies:  Allergies  Allergen Reactions  . Ibuprofen Other (See Comments)    Patient does not remember.   . Morphine And Related Other (See Comments)    Patient does not remember reaction, believes it caused raised marks on skin.    Past Medical History, Surgical history, Social history, and Family History were reviewed and updated.  Review of Systems: Constitutional:  Negative for fever, chills, night sweats, anorexia, weight loss, pain. Cardiovascular: no chest pain or dyspnea on exertion Respiratory: no cough, shortness of breath, or wheezing Neurological: no TIA or stroke symptoms Dermatological: negative ENT: negative Skin: Negative. Gastrointestinal: no abdominal pain, change in bowel habits, or black or bloody stools Genito-Urinary: no dysuria, trouble voiding, or hematuria Hematological and Lymphatic: negative Breast: negative for breast lumps Musculoskeletal: negative Remaining ROS negative.  Physical Exam: Blood pressure 148/78, pulse 58, temperature 98.1 F (36.7 C), temperature source Oral, resp. rate 18, height 5\' 9"  (1.753 m), weight 264 lb 14.4  oz (120.158 kg). ECOG: 1 General appearance: alert Head: Normocephalic, without obvious abnormality, atraumatic Neck: no adenopathy, no carotid bruit, no JVD, supple, symmetrical, trachea midline and thyroid not  enlarged, symmetric, no tenderness/mass/nodules Lymph nodes: Cervical, supraclavicular, and axillary nodes normal. Heart:regular rate and rhythm, S1, S2 normal, no murmur, click, rub or gallop Lung:chest clear, no wheezing, rales, normal symmetric air entry, Heart exam - S1, S2 normal, no murmur, no gallop, rate regular Abdomen: soft, non-tender, without masses or organomegaly. Wound appear have healed. EXT:no erythema, induration, or nodules   Lab Results: Lab Results  Component Value Date   WBC 2.6* 07/21/2012   HGB 13.7 07/21/2012   HCT 43.0 07/21/2012   MCV 90.7 07/21/2012   PLT 144* 07/21/2012     Chemistry      Component Value Date/Time   NA 145 07/21/2012 1423   NA 137 02/11/2012 1445   K 4.1 07/21/2012 1423   K 5.2 02/11/2012 1445   CL 110* 07/21/2012 1423   CL 109 02/11/2012 1445   CO2 28 07/21/2012 1423   CO2 20 02/11/2012 1445   BUN 18.4 07/21/2012 1423   BUN 62* 02/11/2012 1445   CREATININE 1.2* 07/21/2012 1423   CREATININE 2.40* 02/11/2012 1445      Component Value Date/Time   CALCIUM 9.2 07/21/2012 1423   CALCIUM 9.4 02/11/2012 1445   ALKPHOS 86 07/21/2012 1423   ALKPHOS 57 02/11/2012 1445   AST 17 07/21/2012 1423   AST 15 02/11/2012 1445   ALT 12 07/21/2012 1423   ALT 12 02/11/2012 1445   BILITOT 0.70 07/21/2012 1423   BILITOT 0.5 02/11/2012 1445      Results for [name] (MRN [id number]) as of 07/21/2012 17:02  Ref. Range 12/19/2011 14:48 03/10/2012 14:21 03/31/2012 14:20 04/14/2012 14:29 05/12/2012 14:07 06/23/2012 14:20  Kappa free light chain Latest Range: 0.33-1.94 mg/dL 4098.11 (H) 914.78 (H) 335.00 (H) 176.00 (H) 195.00 (H) 222.00 (H)    Impression and Plan:  This is a 62 year old female with the following issues:  1. Light chain multiple myeloma. Recently completed Kyprolis with improvement in light chains.Recommend continued observation at this time. 2. Pain in her hips/legs: Plain film x-rays show multiple myeloma. Continue Oxycodone. 3. Anemia,  multifactorial. No active bleeding. No transfusion is indicated at this time. Hemoglobin has normalized. 4. History of deep vein thrombosis. Previously anticoagulated with Coumadin. No new clots noted.  5. Hypertension seems to be under relatively good control.  6. IV access: PAC in place. Will need to be flushed with next visit. 7. Follow up. 6 weeks.     [place], [place] 1/28/20144:59 PM

## 2012-07-22 ENCOUNTER — Telehealth: Payer: Self-pay | Admitting: Oncology

## 2012-07-22 NOTE — Telephone Encounter (Signed)
s.w. pt and advised on 3.12.14 appt....pt ok

## 2012-07-23 ENCOUNTER — Other Ambulatory Visit: Payer: Self-pay | Admitting: *Deleted

## 2012-07-23 LAB — SPEP & IFE WITH QIG
Albumin ELP: 63.3 % (ref 55.8–66.1)
Alpha-1-Globulin: 5.7 % — ABNORMAL HIGH (ref 2.9–4.9)
IgA: 16 mg/dL — ABNORMAL LOW (ref 69–380)
IgM, Serum: 4 mg/dL — ABNORMAL LOW (ref 52–322)
Total Protein, Serum Electrophoresis: 6.4 g/dL (ref 6.0–8.3)

## 2012-07-23 LAB — KAPPA/LAMBDA LIGHT CHAINS
Kappa free light chain: 232 mg/dL — ABNORMAL HIGH (ref 0.33–1.94)
Lambda Free Lght Chn: 0.18 mg/dL — ABNORMAL LOW (ref 0.57–2.63)

## 2012-07-23 MED ORDER — DIAZEPAM 5 MG PO TABS: 5.0000 mg | ORAL_TABLET | Freq: Three times a day (TID) | ORAL | Status: DC | PRN

## 2012-07-23 NOTE — Telephone Encounter (Signed)
Called in #3 valium 5 mg tablets for patient, she is going to a funeral for her husband

## 2012-08-13 ENCOUNTER — Other Ambulatory Visit: Payer: Self-pay | Admitting: *Deleted

## 2012-08-13 DIAGNOSIS — C9000 Multiple myeloma not having achieved remission: Secondary | ICD-10-CM

## 2012-08-13 MED ORDER — RANITIDINE HCL 150 MG PO TABS: 150.0000 mg | ORAL_TABLET | Freq: Two times a day (BID) | ORAL | Status: DC

## 2012-08-14 ENCOUNTER — Other Ambulatory Visit: Payer: Self-pay | Admitting: Oncology

## 2012-08-14 MED ORDER — AMLODIPINE BESYLATE 5 MG PO TABS: 5.0000 mg | ORAL_TABLET | Freq: Every day | ORAL | Status: DC

## 2012-09-02 ENCOUNTER — Other Ambulatory Visit: Payer: Self-pay | Admitting: *Deleted

## 2012-09-02 ENCOUNTER — Other Ambulatory Visit (HOSPITAL_BASED_OUTPATIENT_CLINIC_OR_DEPARTMENT_OTHER): Payer: Medicaid Other | Admitting: Lab

## 2012-09-02 ENCOUNTER — Ambulatory Visit (HOSPITAL_BASED_OUTPATIENT_CLINIC_OR_DEPARTMENT_OTHER): Payer: Medicaid Other | Admitting: Oncology

## 2012-09-02 VITALS — BP 150/77 | HR 77 | Temp 97.7°F | Resp 18 | Ht 69.0 in

## 2012-09-02 DIAGNOSIS — C9000 Multiple myeloma not having achieved remission: Secondary | ICD-10-CM

## 2012-09-02 DIAGNOSIS — D649 Anemia, unspecified: Secondary | ICD-10-CM

## 2012-09-02 DIAGNOSIS — Z86718 Personal history of other venous thrombosis and embolism: Secondary | ICD-10-CM

## 2012-09-02 DIAGNOSIS — M25559 Pain in unspecified hip: Secondary | ICD-10-CM

## 2012-09-02 LAB — COMPREHENSIVE METABOLIC PANEL (CC13)
AST: 21 U/L (ref 5–34)
Albumin: 3.7 g/dL (ref 3.5–5.0)
Alkaline Phosphatase: 82 U/L (ref 40–150)
Glucose: 82 mg/dl (ref 70–99)
Potassium: 3.9 mEq/L (ref 3.5–5.1)
Sodium: 144 mEq/L (ref 136–145)
Total Bilirubin: 0.51 mg/dL (ref 0.20–1.20)
Total Protein: 7.1 g/dL (ref 6.4–8.3)

## 2012-09-02 LAB — CBC WITH DIFFERENTIAL/PLATELET
EOS%: 1.2 % (ref 0.0–7.0)
Eosinophils Absolute: 0 10*3/uL (ref 0.0–0.5)
LYMPH%: 32.7 % (ref 14.0–49.7)
MCH: 29.2 pg (ref 25.1–34.0)
MCHC: 32.6 g/dL (ref 31.5–36.0)
MCV: 89.6 fL (ref 79.5–101.0)
MONO%: 13.8 % (ref 0.0–14.0)
Platelets: 116 10*3/uL — ABNORMAL LOW (ref 145–400)
RBC: 4.67 10*6/uL (ref 3.70–5.45)
RDW: 15.1 % — ABNORMAL HIGH (ref 11.2–14.5)

## 2012-09-02 MED ORDER — HEPARIN SOD (PORK) LOCK FLUSH 100 UNIT/ML IV SOLN
500.0000 [IU] | Freq: Once | INTRAVENOUS | Status: AC
  Administered 2012-09-02: 500 [IU] via INTRAVENOUS
  Filled 2012-09-02: qty 5

## 2012-09-02 MED ORDER — OXYBUTYNIN CHLORIDE 5 MG PO TABS: 5.0000 mg | ORAL_TABLET | Freq: Every day | ORAL | Status: DC

## 2012-09-02 MED ORDER — BUPROPION HCL ER (XL) 150 MG PO TB24: 150.0000 mg | ORAL_TABLET | Freq: Every day | ORAL | Status: DC

## 2012-09-02 MED ORDER — FUROSEMIDE 40 MG PO TABS: 40.0000 mg | ORAL_TABLET | Freq: Every day | ORAL | Status: DC

## 2012-09-02 MED ORDER — SODIUM CHLORIDE 0.9 % IJ SOLN
10.0000 mL | INTRAMUSCULAR | Status: DC | PRN
  Administered 2012-09-02: 10 mL via INTRAVENOUS
  Filled 2012-09-02: qty 10

## 2012-09-02 MED ORDER — RANITIDINE HCL 150 MG PO TABS: 150.0000 mg | ORAL_TABLET | Freq: Two times a day (BID) | ORAL | Status: DC

## 2012-09-02 NOTE — Progress Notes (Signed)
Gave patient # [phone] for [facility] to get established with a primary care physician. Per dr Clelia Croft okay to call in 2 wk supply for wellbutrin, lasix, ditropan, zantac. Until she can get an appt there.Patient notified.

## 2012-09-02 NOTE — Progress Notes (Signed)
Hematology and Oncology Follow Up Visit  [name] [id number] March 15, 1951 62 y.o. 09/02/2012 3:37 PM      Principle Diagnosis:  This is a 62 year old female with the following issues:   1. 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  She had a free kappa light chain around 1500. 2. Diagnosis of deep vein thrombosis in July 2011.   Prior Therapy:    1. Initially treated with melphalan and prednisone.  She had a partial response with free light chains down as low as 19, M spike down to 0.52 g/dL.  2. Patient treated with Revlimid at 25 mg 3 weeks on and 1 week off between November 2009 until November 2010.  She had a partial response, light chains down to 33 and M spike was not detected.   3. She was treated on maintenance Revlimid 15 mg to maintain her partial response. 4. She was on Zometa intermittently, on hold know. 5. Currently anticoagulated with Coumadin 7.5 mg since July 2011. 6.     She received Velcade 1.3 mg/sq m for a total of 2.9 mg weekly, which started on 02/21/2011 with weekly dexamethasone .  Treatment has been on hold due to recurrent hospitalization for wound infection. Treatment restarted in in 06/2011 till 09/2011. 7. Kyprolis started on 02/04/12. She completed 6 months of therapy on 07/08/12  Current therapy: Watchful observation  Interim History:  [name] presents today for an office followup visit. This is a pleasant 63 year old female with a few comorbid conditions as outlined above. She has tolerated Kyprolis without complications at this time. She is reporting pain in both hips radiating to the back of her thighs which has improved significantly. No neurological symptoms. She is able to ambulate and actually pain is relieved by stretching and ambulation. She takes Oxycodone which helps.  She does not report any obvious bleeding such as epistaxis, gum bleeding, hemoptysis,  melena, hematochezia or hematuria.  She does have some intermittent swelling in her right lower extremity which is improved now.  She does not report any headaches, visual changes, any dysphagia, odynophagia, any nausea, vomiting, diarrhea, constipation, chest pain, shortness of breath, productive cough, abdominal pain, abdominal swelling, lower extremity paresthesias, bowel or bladder incontinence.  She denies any fevers, chills or night sweats. Patient's husband died 2 days ago due to end-stage dementia.  Medications: I have reviewed the patient's current medications.  Allergies:  Allergies  Allergen Reactions  . Ibuprofen Other (See Comments)    Patient does not remember.   . Morphine And Related Other (See Comments)    Patient does not remember reaction, believes it caused raised marks on skin.    Past Medical History, Surgical history, Social history, and Family History were reviewed and updated.  Review of Systems: Constitutional:  Negative for fever, chills, night sweats, anorexia, weight loss, pain. Cardiovascular: no chest pain or dyspnea on exertion Respiratory: no cough, shortness of breath, or wheezing Neurological: no TIA or stroke symptoms Dermatological: negative ENT: negative Skin: Negative. Gastrointestinal: no abdominal pain, change in bowel habits, or black or bloody stools Genito-Urinary: no dysuria, trouble voiding, or hematuria Hematological and Lymphatic: negative Breast: negative for breast lumps Musculoskeletal: negative Remaining ROS negative.  Physical Exam: Blood pressure 150/77, pulse 77, temperature 97.7 F (36.5 C), temperature source Oral, resp. rate 18, height 5\' 9"  (1.753 m). ECOG: 1 General appearance: alert Head: Normocephalic, without obvious abnormality, atraumatic Neck: no  adenopathy, no carotid bruit, no JVD, supple, symmetrical, trachea midline and thyroid not enlarged, symmetric, no tenderness/mass/nodules Lymph nodes: Cervical,  supraclavicular, and axillary nodes normal. Heart:regular rate and rhythm, S1, S2 normal, no murmur, click, rub or gallop Lung:chest clear, no wheezing, rales, normal symmetric air entry, Heart exam - S1, S2 normal, no murmur, no gallop, rate regular Abdomen: soft, non-tender, without masses or organomegaly. Wound appear have healed. EXT:no erythema, induration, or nodules   Lab Results: Lab Results  Component Value Date   WBC 2.6* 09/02/2012   HGB 13.6 09/02/2012   HCT 41.8 09/02/2012   MCV 89.6 09/02/2012   PLT 116* 09/02/2012     Chemistry      Component Value Date/Time   NA 144 09/02/2012 1451   NA 137 02/11/2012 1445   K 3.9 09/02/2012 1451   K 5.2 02/11/2012 1445   CL 110* 09/02/2012 1451   CL 109 02/11/2012 1445   CO2 25 09/02/2012 1451   CO2 20 02/11/2012 1445   BUN 22.0 09/02/2012 1451   BUN 62* 02/11/2012 1445   CREATININE 1.1 09/02/2012 1451   CREATININE 2.40* 02/11/2012 1445      Component Value Date/Time   CALCIUM 8.9 09/02/2012 1451   CALCIUM 9.4 02/11/2012 1445   ALKPHOS 82 09/02/2012 1451   ALKPHOS 57 02/11/2012 1445   AST 21 09/02/2012 1451   AST 15 02/11/2012 1445   ALT 14 09/02/2012 1451   ALT 12 02/11/2012 1445   BILITOT 0.51 09/02/2012 1451   BILITOT 0.5 02/11/2012 1445       Impression and Plan:  This is a 62 year old female with the following issues:  1. Light chain multiple myeloma. Recently completed Kyprolis with improvement in light chains.Recommend continued observation at this time. 2. Pain in her hips/legs: Plain film x-rays show multiple myeloma. Continue Oxycodone. 3. Anemia, multifactorial. No active bleeding. No transfusion is indicated at this time. Hemoglobin has normalized. 4. History of deep vein thrombosis. Previously anticoagulated with Coumadin. No new clots noted. No more anticoagulation at this time.  5. Hypertension seems to be under relatively good control.  6. IV access: PAC in place. Will need to be flushed with next visit. 7. Follow up. 6  weeks.     [facility] 3/12/20143:37 PM

## 2012-09-07 LAB — PROTEIN ELECTROPHORESIS, SERUM
Albumin ELP: 59.6 % (ref 55.8–66.1)
Total Protein, Serum Electrophoresis: 7.3 g/dL (ref 6.0–8.3)

## 2012-09-07 LAB — KAPPA/LAMBDA LIGHT CHAINS
Kappa free light chain: 500 mg/dL — ABNORMAL HIGH (ref 0.33–1.94)
Lambda Free Lght Chn: 0.13 mg/dL — ABNORMAL LOW (ref 0.57–2.63)

## 2012-09-22 ENCOUNTER — Emergency Department (HOSPITAL_COMMUNITY)
Admission: EM | Admit: 2012-09-22 | Discharge: 2012-09-22 | Disposition: A | Discharge status: Home / Self Care | Payer: Medicaid Other | Source: Home / Self Care

## 2012-09-22 ENCOUNTER — Encounter (HOSPITAL_COMMUNITY): Payer: Self-pay

## 2012-09-22 DIAGNOSIS — C9000 Multiple myeloma not having achieved remission: Secondary | ICD-10-CM

## 2012-09-22 DIAGNOSIS — C9001 Multiple myeloma in remission: Secondary | ICD-10-CM

## 2012-09-22 DIAGNOSIS — I10 Essential (primary) hypertension: Secondary | ICD-10-CM

## 2012-09-22 MED ORDER — ATENOLOL 25 MG PO TABS: 25.0000 mg | ORAL_TABLET | Freq: Every day | ORAL | Status: DC

## 2012-09-22 MED ORDER — DIAZEPAM 5 MG PO TABS: 5.0000 mg | ORAL_TABLET | Freq: Three times a day (TID) | ORAL | Status: DC | PRN

## 2012-09-22 MED ORDER — BUPROPION HCL ER (XL) 150 MG PO TB24: 150.0000 mg | ORAL_TABLET | Freq: Every day | ORAL | Status: DC

## 2012-09-22 MED ORDER — OXYCODONE-ACETAMINOPHEN 5-325 MG PO TABS: 1.0000 | ORAL_TABLET | ORAL | Status: DC | PRN

## 2012-09-22 MED ORDER — FUROSEMIDE 40 MG PO TABS: 40.0000 mg | ORAL_TABLET | Freq: Every day | ORAL | Status: AC

## 2012-09-22 MED ORDER — AMLODIPINE BESYLATE 5 MG PO TABS: 5.0000 mg | ORAL_TABLET | Freq: Every day | ORAL | Status: AC

## 2012-09-22 MED ORDER — AMLODIPINE BESYLATE 5 MG PO TABS: 5.0000 mg | ORAL_TABLET | Freq: Every day | ORAL | Status: DC

## 2012-09-22 MED ORDER — PRAVASTATIN SODIUM 40 MG PO TABS: 40.0000 mg | ORAL_TABLET | Freq: Every day | ORAL | Status: DC

## 2012-09-22 MED ORDER — OXYBUTYNIN CHLORIDE 5 MG PO TABS: 5.0000 mg | ORAL_TABLET | Freq: Every day | ORAL | Status: DC

## 2012-09-22 MED ORDER — FUROSEMIDE 40 MG PO TABS: 40.0000 mg | ORAL_TABLET | Freq: Every day | ORAL | Status: DC

## 2012-09-22 MED ORDER — BUPROPION HCL ER (XL) 150 MG PO TB24: 150.0000 mg | ORAL_TABLET | Freq: Every day | ORAL | Status: AC

## 2012-09-22 NOTE — ED Notes (Signed)
Patient has a history of HTN cholesterol

## 2012-09-22 NOTE — ED Provider Notes (Signed)
History     CSN: [id number]  Arrival date & time 09/22/12  1501   First MD Initiated Contact with Patient 09/22/12 1515      Chief Complaint  Patient presents with  . Medication Refill    (Consider location/radiation/quality/duration/timing/severity/associated sxs/prior treatment) HPI Patient and 62 year old female with multiple and chronic complex medical conditions outlined below, presents to clinic for regular followup and needs refills on her blood pressure medicines. She denies chest pain or shortness of breath, no abdominal or urinary concerns, no specific systemic concerns at this time. She reports checking blood pressure regularly and the blood pressure is usually well controlled. Past Medical History  Diagnosis Date  . Small bowel obstruction   . Diabetes mellitus   . Hypertension   . Hyperlipidemia   . Chronic renal insufficiency   . Multiple myeloma(203.0)   . Abdominal pain   . Ventral hernia   . Congestive heart failure   . GERD (gastroesophageal reflux disease)   . Renal insufficiency   . Obesity   . Depression   . Anxiety   . Abscess     groin    Past Surgical History  Procedure Laterality Date  . Hernia repair      ventral hernia  . Exploratory laparotomy    . Oophorectomy    . Breast surgery      reduction  . Panniculectomy    . Incise and drain abcess      groin    Family History  Problem Relation Age of Onset  . Heart disease Mother     History  Substance Use Topics  . Smoking status: Former Games developer  . Smokeless tobacco: Never Used  . Alcohol Use: No    OB History   Grav Para Term Preterm Abortions TAB SAB Ect Mult Living                  Review of Systems  Constitutional: Negative for fever, chills, diaphoresis, activity change, appetite change and fatigue.  HENT: Negative for ear pain, nosebleeds, congestion, facial swelling, rhinorrhea, neck pain, neck stiffness and ear discharge.   Eyes: Negative for pain, discharge,  redness, itching and visual disturbance.  Respiratory: Negative for cough, choking, chest tightness, shortness of breath, wheezing and stridor.   Cardiovascular: Negative for chest pain, palpitations and leg swelling.  Gastrointestinal: Negative for abdominal distention.  Genitourinary: Negative for dysuria, urgency, frequency, hematuria, flank pain, decreased urine volume, difficulty urinating and dyspareunia.  Musculoskeletal: Negative for back pain, joint swelling, arthralgias and gait problem.  Neurological: Negative for dizziness, tremors, seizures, syncope, facial asymmetry, speech difficulty, weakness, light-headedness, numbness and headaches.  Hematological: Negative for adenopathy. Does not bruise/bleed easily.  Psychiatric/Behavioral: Negative for hallucinations, behavioral problems, confusion, dysphoric mood, decreased concentration and agitation.    Allergies  Ibuprofen and Morphine and related  Home Medications   Current Outpatient Rx  Name  Route  Sig  Dispense  Refill  . amLODipine (NORVASC) 5 MG tablet   Oral   Take 1 tablet (5 mg total) by mouth daily.   30 tablet   11     Refill request was faxed.   Marland Kitchen aspirin 81 MG tablet   Oral   Take 81 mg by mouth daily.           Marland Kitchen atenolol (TENORMIN) 25 MG tablet   Oral   Take 1 tablet (25 mg total) by mouth daily.   30 tablet   11   . buPROPion (WELLBUTRIN XL)  150 MG 24 hr tablet   Oral   Take 1 tablet (150 mg total) by mouth daily.   30 tablet   11   . diazepam (VALIUM) 5 MG tablet   Oral   Take 1 tablet (5 mg total) by mouth every 8 (eight) hours as needed for anxiety.   65 tablet   0   . furosemide (LASIX) 40 MG tablet   Oral   Take 1 tablet (40 mg total) by mouth daily.   30 tablet   11   . lidocaine-prilocaine (EMLA) cream   Topical   Apply topically as needed.   30 g   1   . loratadine (CLARITIN) 10 MG tablet   Oral   Take 10 mg by mouth daily.           . Multiple Vitamin  (MULTIVITAMIN) tablet   Oral   Take 1 tablet by mouth daily.           . ondansetron (ZOFRAN) 8 MG tablet   Oral   Take 1 tablet (8 mg total) by mouth every 8 (eight) hours as needed.   30 tablet   1   . oxybutynin (DITROPAN) 5 MG tablet   Oral   Take 1 tablet (5 mg total) by mouth daily.   30 tablet   11   . oxyCODONE-acetaminophen (PERCOCET/ROXICET) 5-325 MG per tablet   Oral   Take 1 tablet by mouth every 4 (four) hours as needed for pain.   65 tablet   0   . potassium chloride (KLOR-CON) 20 MEQ packet   Oral   Take 20 mEq by mouth daily.           . pravastatin (PRAVACHOL) 40 MG tablet   Oral   Take 1 tablet (40 mg total) by mouth daily.   30 tablet   11   . prochlorperazine (COMPAZINE) 10 MG tablet   Oral   Take 1 tablet (10 mg total) by mouth every 6 (six) hours as needed.   60 tablet   1   . ranitidine (ZANTAC) 150 MG tablet   Oral   Take 1 tablet (150 mg total) by mouth 2 (two) times daily.   14 tablet   0   . temazepam (RESTORIL) 15 MG capsule   Oral   Take 1 capsule (15 mg total) by mouth at bedtime as needed.   20 capsule   1   . warfarin (COUMADIN) 5 MG tablet   Oral   Take 1 tablet (5 mg total) by mouth daily. Or as directed by physician   20 tablet   0     BP 119/62  Temp(Src) 97.7 F (36.5 C) (Oral)  SpO2 98%  Physical Exam  Constitutional: Appears well-developed and well-nourished. No distress.  HENT: Normocephalic. External right and left ear normal. Oropharynx is clear and moist.  Eyes: Conjunctivae and EOM are normal. PERRLA, no scleral icterus.  Neck: Normal ROM. Neck supple. No JVD. No tracheal deviation. No thyromegaly.  CVS: RRR, S1/S2 +, no murmurs, no gallops, no carotid bruit.  Pulmonary: Effort and breath sounds normal, no stridor, rhonchi, wheezes, rales.  Abdominal: Soft. BS +,  no distension, tenderness, rebound or guarding.  Musculoskeletal: Normal range of motion. No edema and no tenderness.  Lymphadenopathy:  No lymphadenopathy noted, cervical, inguinal. Neuro: Alert. Normal reflexes, muscle tone coordination. No cranial nerve deficit. Skin: Skin is warm and dry. No rash noted. Not diaphoretic. No erythema. No pallor.  Psychiatric: Normal mood and affect. Behavior, judgment, thought content normal.    ED Course  Procedures (including critical care time)  Labs Reviewed - No data to display No results found.   1. HTN (hypertension) - we have discussed target blood pressure ranged, advised patient to check blood pressure regularly and to call his packet the numbers are persistently higher than 140/90   2. Multiple myeloma - patient follows with Dr. Clelia Croft      MDM  HTN,         Dorothea Ogle, MD 09/22/12 1536

## 2012-10-01 ENCOUNTER — Other Ambulatory Visit: Payer: Self-pay | Admitting: *Deleted

## 2012-10-01 DIAGNOSIS — C9000 Multiple myeloma not having achieved remission: Secondary | ICD-10-CM

## 2012-10-01 MED ORDER — PROCHLORPERAZINE MALEATE 10 MG PO TABS: 10.0000 mg | ORAL_TABLET | Freq: Four times a day (QID) | ORAL | Status: DC | PRN

## 2012-10-01 NOTE — Telephone Encounter (Signed)
Patient calling toc/o feeling nauseated and would like anti nausea medicine called to [facility]. Per dr Clelia Croft, ok to call in compazine 10mg  tablets. Patient notified.

## 2012-10-15 ENCOUNTER — Telehealth: Payer: Self-pay | Admitting: Oncology

## 2012-10-15 ENCOUNTER — Encounter: Payer: Self-pay | Admitting: Oncology

## 2012-10-15 ENCOUNTER — Ambulatory Visit: Payer: Medicaid Other

## 2012-10-15 ENCOUNTER — Ambulatory Visit (HOSPITAL_BASED_OUTPATIENT_CLINIC_OR_DEPARTMENT_OTHER): Payer: Medicaid Other | Admitting: Oncology

## 2012-10-15 ENCOUNTER — Other Ambulatory Visit (HOSPITAL_BASED_OUTPATIENT_CLINIC_OR_DEPARTMENT_OTHER): Payer: Medicaid Other | Admitting: Lab

## 2012-10-15 ENCOUNTER — Telehealth: Payer: Self-pay | Admitting: *Deleted

## 2012-10-15 VITALS — BP 135/68 | HR 64 | Temp 97.0°F | Resp 18 | Ht 69.0 in

## 2012-10-15 DIAGNOSIS — M25559 Pain in unspecified hip: Secondary | ICD-10-CM

## 2012-10-15 DIAGNOSIS — C9000 Multiple myeloma not having achieved remission: Secondary | ICD-10-CM

## 2012-10-15 DIAGNOSIS — D649 Anemia, unspecified: Secondary | ICD-10-CM

## 2012-10-15 DIAGNOSIS — Z86718 Personal history of other venous thrombosis and embolism: Secondary | ICD-10-CM

## 2012-10-15 LAB — CBC WITH DIFFERENTIAL/PLATELET
BASO%: 0.9 % (ref 0.0–2.0)
Basophils Absolute: 0 10e3/uL (ref 0.0–0.1)
EOS%: 0.3 % (ref 0.0–7.0)
Eosinophils Absolute: 0 10e3/uL (ref 0.0–0.5)
HCT: 42.9 % (ref 34.8–46.6)
HGB: 14 g/dL (ref 11.6–15.9)
LYMPH%: 39.1 % (ref 14.0–49.7)
MCH: 28.5 pg (ref 25.1–34.0)
MCHC: 32.5 g/dL (ref 31.5–36.0)
MCV: 87.4 fL (ref 79.5–101.0)
MONO#: 0.3 10e3/uL (ref 0.1–0.9)
MONO%: 11.6 % (ref 0.0–14.0)
NEUT#: 1.4 10e3/uL — ABNORMAL LOW (ref 1.5–6.5)
NEUT%: 48.1 % (ref 38.4–76.8)
Platelets: 104 10e3/uL — ABNORMAL LOW (ref 145–400)
RBC: 4.91 10e6/uL (ref 3.70–5.45)
RDW: 14.7 % — ABNORMAL HIGH (ref 11.2–14.5)
WBC: 2.8 10e3/uL — ABNORMAL LOW (ref 3.9–10.3)
lymph#: 1.1 10e3/uL (ref 0.9–3.3)

## 2012-10-15 LAB — COMPREHENSIVE METABOLIC PANEL (CC13)
ALT: 11 U/L (ref 0–55)
AST: 27 U/L (ref 5–34)
Albumin: 3.7 g/dL (ref 3.5–5.0)
Alkaline Phosphatase: 77 U/L (ref 40–150)
Glucose: 85 mg/dl (ref 70–99)
Potassium: 4.2 mEq/L (ref 3.5–5.1)
Sodium: 140 mEq/L (ref 136–145)
Total Bilirubin: 0.46 mg/dL (ref 0.20–1.20)
Total Protein: 8 g/dL (ref 6.4–8.3)

## 2012-10-15 MED ORDER — PROCHLORPERAZINE MALEATE 10 MG PO TABS: 10.0000 mg | ORAL_TABLET | Freq: Four times a day (QID) | ORAL | Status: DC | PRN

## 2012-10-15 MED ORDER — ONDANSETRON HCL 8 MG PO TABS: 8.0000 mg | ORAL_TABLET | Freq: Three times a day (TID) | ORAL | Status: DC | PRN

## 2012-10-15 MED ORDER — OXYCODONE-ACETAMINOPHEN 5-325 MG PO TABS: 1.0000 | ORAL_TABLET | ORAL | Status: DC | PRN

## 2012-10-15 NOTE — Telephone Encounter (Signed)
gv and printed appt sched and avs for pt...emaield michelle to add tx....pt aweare

## 2012-10-15 NOTE — Progress Notes (Signed)
Hematology and Oncology Follow Up Visit  [name] [id number] Jun 02, 1951 62 y.o. 10/15/2012 3:26 PM      Principle Diagnosis:  This is a 62 year old female with the following issues:   1. 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  She had a free kappa light chain around 1500. 2. Diagnosis of deep vein thrombosis in July 2011.   Prior Therapy:    1. Initially treated with melphalan and prednisone.  She had a partial response with free light chains down as low as 19, M spike down to 0.52 g/dL.  2. Patient treated with Revlimid at 25 mg 3 weeks on and 1 week off between November 2009 until November 2010.  She had a partial response, light chains down to 33 and M spike was not detected.   3. She was treated on maintenance Revlimid 15 mg to maintain her partial response. 4. She was on Zometa intermittently, on hold know. 5. Currently anticoagulated with Coumadin 7.5 mg since July 2011. 6.     She received Velcade 1.3 mg/sq m for a total of 2.9 mg weekly, which started on 02/21/2011 with weekly dexamethasone .  Treatment has been on hold due to recurrent hospitalization for wound infection. Treatment restarted in in 06/2011 till 09/2011. 7. Kyprolis started on 02/04/12. She completed 6 months of therapy on 07/08/12  Current therapy: Watchful observation  Interim History:  [name] presents today for an office followup visit. This is a pleasant 62 year old female with a few comorbid conditions as outlined above. She is reporting pain in both hips radiating to the back of her thighs which has improved significantly. No neurological symptoms. She is able to ambulate and actually pain is relieved by stretching and ambulation. She takes Oxycodone which helps.  She does not report any obvious bleeding such as epistaxis, gum bleeding, hemoptysis, melena, hematochezia or hematuria.  She does have some  intermittent swelling in her right lower extremity which is improved now.  She does not report any headaches, visual changes, any dysphagia, odynophagia, any nausea, vomiting, diarrhea, constipation, chest pain, shortness of breath, productive cough, abdominal pain, abdominal swelling, lower extremity paresthesias, bowel or bladder incontinence.  She denies any fevers, chills or night sweats.  Medications: I have reviewed the patient's current medications.  Allergies:  Allergies  Allergen Reactions  . Ibuprofen Other (See Comments)    Patient does not remember.   . Morphine And Related Other (See Comments)    Patient does not remember reaction, believes it caused raised marks on skin.    Past Medical History, Surgical history, Social history, and Family History were reviewed and updated.  Review of Systems: Constitutional:  Negative for fever, chills, night sweats, anorexia, weight loss, pain. Cardiovascular: no chest pain or dyspnea on exertion Respiratory: no cough, shortness of breath, or wheezing Neurological: no TIA or stroke symptoms Dermatological: negative ENT: negative Skin: Negative. Gastrointestinal: no abdominal pain, change in bowel habits, or black or bloody stools Genito-Urinary: no dysuria, trouble voiding, or hematuria Hematological and Lymphatic: negative Breast: negative for breast lumps Musculoskeletal: negative Remaining ROS negative.  Physical Exam: Blood pressure 135/68, pulse 64, temperature 97 F (36.1 C), temperature source Oral, resp. rate 18, height 5\' 9"  (1.753 m). ECOG: 1 General appearance: alert Head: Normocephalic, without obvious abnormality, atraumatic Neck: no adenopathy, no carotid bruit, no JVD, supple, symmetrical, trachea midline and thyroid not enlarged, symmetric, no tenderness/mass/nodules Lymph nodes:  Cervical, supraclavicular, and axillary nodes normal. Heart:regular rate and rhythm, S1, S2 normal, no murmur, click, rub or  gallop Lung:chest clear, no wheezing, rales, normal symmetric air entry, Heart exam - S1, S2 normal, no murmur, no gallop, rate regular Abdomen: soft, non-tender, without masses or organomegaly. Wound appear have healed. EXT:no erythema, induration, or nodules   Lab Results: Lab Results  Component Value Date   WBC 2.8* 10/15/2012   HGB 14.0 10/15/2012   HCT 42.9 10/15/2012   MCV 87.4 10/15/2012   PLT 104* 10/15/2012     Chemistry      Component Value Date/Time   NA 140 10/15/2012 1434   NA 137 02/11/2012 1445   K 4.2 10/15/2012 1434   K 5.2 02/11/2012 1445   CL 105 10/15/2012 1434   CL 109 02/11/2012 1445   CO2 26 10/15/2012 1434   CO2 20 02/11/2012 1445   BUN 25.0 10/15/2012 1434   BUN 62* 02/11/2012 1445   CREATININE 1.4* 10/15/2012 1434   CREATININE 2.40* 02/11/2012 1445      Component Value Date/Time   CALCIUM 9.5 10/15/2012 1434   CALCIUM 9.4 02/11/2012 1445   ALKPHOS 77 10/15/2012 1434   ALKPHOS 57 02/11/2012 1445   AST 27 10/15/2012 1434   AST 15 02/11/2012 1445   ALT 11 10/15/2012 1434   ALT 12 02/11/2012 1445   BILITOT 0.46 10/15/2012 1434   BILITOT 0.5 02/11/2012 1445      Impression and Plan:  This is a 62 year old female with the following issues:  1. Light chain multiple myeloma. Recently completed Kyprolis with improvement in light chains.Recommend continued observation at this time. Will consider restarting Zometa at next visit. 2. Pain in her hips/legs: Plain film x-rays show multiple myeloma. Continue Oxycodone. 3. Anemia, multifactorial. No active bleeding. No transfusion is indicated at this time. Hemoglobin has normalized. 4. History of deep vein thrombosis. Previously anticoagulated with Coumadin. No new clots noted. No more anticoagulation at this time.  5. Hypertension seems to be under relatively good control.  6. IV access: PAC in place. Flushed today. 7. Follow up. 6 weeks.     Clenton Pare 4/24/20143:26 PM

## 2012-10-15 NOTE — Telephone Encounter (Signed)
Per staff message and POF I have scheduled appts.  JMW  

## 2012-11-09 ENCOUNTER — Ambulatory Visit: Payer: Medicaid Other | Attending: Internal Medicine | Admitting: Family Medicine

## 2012-11-09 ENCOUNTER — Encounter: Payer: Self-pay | Admitting: Family Medicine

## 2012-11-09 VITALS — BP 126/80 | HR 80 | Temp 97.4°F | Resp 20 | Ht 65.0 in | Wt 226.0 lb

## 2012-11-09 DIAGNOSIS — R32 Unspecified urinary incontinence: Secondary | ICD-10-CM | POA: Insufficient documentation

## 2012-11-09 NOTE — Patient Instructions (Addendum)
Urinary Incontinence Your doctor wants you to have this information about urinary incontinence. This is the inability to keep urine in your body until you decide to release it. CAUSES  Prostate gland enlargement is a common cause of urinary incontinence. But there are many different causes for losing urinary control. They include:  Medicines.  Infections.  Prostate problems.  Surgery.  Neurological diseases.  Emotional factors. DIAGNOSIS  Evaluating the cause of incontinence is important in choosing the best treatment. This may require:  An ultrasound exam.  Kidney and bladder X-rays.  Cystoscopy. This is an exam of the bladder using a narrow scope. TREATMENT  For incontinent patients, normal daily hygiene and using changing pads or adult diapers regularly will prevent offensive odors and skin damage from the moisture. Changing your medicines may help control incontinence. Your caregiver may prescribe some medicines to help you regain control. Avoid caffeine. It can over-stimulate the bladder. Use the bathroom regularly. Try about every 2 to 3 hours even if you do not feel the need. Take time to empty your bladder completely. After urinating, wait a minute. Then try to urinate again. External devices used to catch urine or an indwelling urine catheter (Foley catheter) may be needed as well. Some prostate gland problems require surgery to correct. Call your caregiver for more information. Document Released: 07/18/2004 Document Revised: 09/02/2011 Document Reviewed: 07/13/2008 Exit[employer] Patient Information 2013 [employer].  

## 2012-11-09 NOTE — Progress Notes (Signed)
Subjective:     Patient ID: [name], female   DOB: Jan 23, 1951, 62 y.o.   MRN: [id number]  HPI Pt here to have papers filled out for renewal of urinary incont supplies. She has used them for the past 2.5 years since a surgery. Without them she has urine leakage leading to skin breakdown.   Review of Systems  Genitourinary: Negative for dysuria and frequency.       Objective:   Physical Exam  Nursing note and vitals reviewed. Constitutional: She appears well-developed and well-nourished.  Cardiovascular: Normal rate.   Pulmonary/Chest: Effort normal.  Abdominal: Soft.  Psychiatric: She has a normal mood and affect.       Assessment:     Bladder incontinence       Plan:     filled out forms - good for 1 year.  She should cont to f/u w heme/onc for her MM Looks that she is due for health maint/phys exam so will have her rtc in a month or so for that.  Call with questions or concerns.

## 2012-11-17 ENCOUNTER — Telehealth: Payer: Self-pay | Admitting: *Deleted

## 2012-11-17 ENCOUNTER — Other Ambulatory Visit: Payer: Self-pay | Admitting: *Deleted

## 2012-11-17 MED ORDER — OXYCODONE-ACETAMINOPHEN 5-325 MG PO TABS: 1.0000 | ORAL_TABLET | ORAL | Status: DC | PRN

## 2012-11-17 NOTE — Telephone Encounter (Signed)
Patient calling to say she has the same pain back that she had before her chemo. Right hip and buttock. Difficult to get up to go to the bathroom on time. Is takiing vicodin, not helping, per dr Clelia Croft ok for percocet 5/325, #30. Script left at front desk and patient notified.

## 2012-11-19 ENCOUNTER — Other Ambulatory Visit: Payer: Self-pay | Admitting: Oncology

## 2012-11-19 ENCOUNTER — Telehealth: Payer: Self-pay | Admitting: *Deleted

## 2012-11-19 ENCOUNTER — Telehealth: Payer: Self-pay | Admitting: Oncology

## 2012-11-19 NOTE — Telephone Encounter (Signed)
Patient calling to say the percocet has not helped with her pain in lower back and right hip area. Per dr sahdad, please have kristin see her tomorrow. appt made and patient notified to be here at 1:15.

## 2012-11-20 ENCOUNTER — Ambulatory Visit (HOSPITAL_BASED_OUTPATIENT_CLINIC_OR_DEPARTMENT_OTHER): Payer: Medicaid Other | Admitting: Oncology

## 2012-11-20 ENCOUNTER — Other Ambulatory Visit (HOSPITAL_BASED_OUTPATIENT_CLINIC_OR_DEPARTMENT_OTHER): Payer: Medicaid Other | Admitting: Lab

## 2012-11-20 ENCOUNTER — Encounter: Payer: Self-pay | Admitting: Oncology

## 2012-11-20 ENCOUNTER — Ambulatory Visit (HOSPITAL_BASED_OUTPATIENT_CLINIC_OR_DEPARTMENT_OTHER): Payer: Medicaid Other

## 2012-11-20 ENCOUNTER — Telehealth: Payer: Self-pay | Admitting: Oncology

## 2012-11-20 ENCOUNTER — Telehealth: Payer: Self-pay | Admitting: *Deleted

## 2012-11-20 VITALS — BP 127/82 | HR 93 | Temp 97.0°F | Resp 20 | Ht 65.0 in | Wt 251.9 lb

## 2012-11-20 DIAGNOSIS — K430 Incisional hernia with obstruction, without gangrene: Secondary | ICD-10-CM

## 2012-11-20 DIAGNOSIS — C9000 Multiple myeloma not having achieved remission: Secondary | ICD-10-CM

## 2012-11-20 DIAGNOSIS — L02211 Cutaneous abscess of abdominal wall: Secondary | ICD-10-CM

## 2012-11-20 DIAGNOSIS — D649 Anemia, unspecified: Secondary | ICD-10-CM

## 2012-11-20 DIAGNOSIS — K436 Other and unspecified ventral hernia with obstruction, without gangrene: Secondary | ICD-10-CM

## 2012-11-20 DIAGNOSIS — Z86718 Personal history of other venous thrombosis and embolism: Secondary | ICD-10-CM

## 2012-11-20 LAB — CBC WITH DIFFERENTIAL/PLATELET
EOS%: 0.3 % (ref 0.0–7.0)
Eosinophils Absolute: 0 10*3/uL (ref 0.0–0.5)
LYMPH%: 31.8 % (ref 14.0–49.7)
MCH: 27.8 pg (ref 25.1–34.0)
MCHC: 32.6 g/dL (ref 31.5–36.0)
MCV: 85.4 fL (ref 79.5–101.0)
MONO%: 12.4 % (ref 0.0–14.0)
Platelets: 89 10*3/uL — ABNORMAL LOW (ref 145–400)
RBC: 4.84 10*6/uL (ref 3.70–5.45)
RDW: 15 % — ABNORMAL HIGH (ref 11.2–14.5)

## 2012-11-20 LAB — COMPREHENSIVE METABOLIC PANEL (CC13)
AST: 54 U/L — ABNORMAL HIGH (ref 5–34)
Albumin: 3.3 g/dL — ABNORMAL LOW (ref 3.5–5.0)
Alkaline Phosphatase: 62 U/L (ref 40–150)
Glucose: 89 mg/dl (ref 70–99)
Potassium: 4.7 mEq/L (ref 3.5–5.1)
Sodium: 139 mEq/L (ref 136–145)
Total Bilirubin: 0.44 mg/dL (ref 0.20–1.20)
Total Protein: 8.4 g/dL — ABNORMAL HIGH (ref 6.4–8.3)

## 2012-11-20 MED ORDER — ONDANSETRON HCL 8 MG PO TABS: 8.0000 mg | ORAL_TABLET | Freq: Three times a day (TID) | ORAL | Status: DC | PRN

## 2012-11-20 MED ORDER — ZOLEDRONIC ACID 4 MG/100ML IV SOLN
4.0000 mg | Freq: Once | INTRAVENOUS | Status: AC
  Administered 2012-11-20: 4 mg via INTRAVENOUS
  Filled 2012-11-20: qty 100

## 2012-11-20 MED ORDER — PROCHLORPERAZINE MALEATE 10 MG PO TABS: 10.0000 mg | ORAL_TABLET | Freq: Four times a day (QID) | ORAL | Status: DC | PRN

## 2012-11-20 MED ORDER — FENTANYL 25 MCG/HR TD PT72: 1.0000 | MEDICATED_PATCH | TRANSDERMAL | Status: DC

## 2012-11-20 MED ORDER — LIDOCAINE-PRILOCAINE 2.5-2.5 % EX CREA: TOPICAL_CREAM | CUTANEOUS | Status: DC | PRN

## 2012-11-20 MED ORDER — SODIUM CHLORIDE 0.9 % IJ SOLN
10.0000 mL | INTRAMUSCULAR | Status: DC | PRN
  Administered 2012-11-20: 10 mL via INTRAVENOUS
  Filled 2012-11-20: qty 10

## 2012-11-20 MED ORDER — HEPARIN SOD (PORK) LOCK FLUSH 100 UNIT/ML IV SOLN
500.0000 [IU] | Freq: Once | INTRAVENOUS | Status: AC
  Administered 2012-11-20: 500 [IU] via INTRAVENOUS
  Filled 2012-11-20: qty 5

## 2012-11-20 NOTE — Patient Instructions (Signed)
Zoledronic Acid injection (Hypercalcemia, Oncology) What is this medicine? ZOLEDRONIC ACID (ZOE le dron ik AS id) lowers the amount of calcium loss from bone. It is used to treat too much calcium in your blood from cancer. It is also used to prevent complications of cancer that has spread to the bone. This medicine may be used for other purposes; ask your health care provider or pharmacist if you have questions. What should I tell my health care provider before I take this medicine? They need to know if you have any of these conditions: -aspirin-sensitive asthma -dental disease -kidney disease -an unusual or allergic reaction to zoledronic acid, other medicines, foods, dyes, or preservatives -pregnant or trying to get pregnant -breast-feeding How should I use this medicine? This medicine is for infusion into a vein. It is given by a health care professional in a hospital or clinic setting. Talk to your pediatrician regarding the use of this medicine in children. Special care may be needed. Overdosage: If you think you have taken too much of this medicine contact a poison control center or emergency room at once. NOTE: This medicine is only for you. Do not share this medicine with others. What if I miss a dose? It is important not to miss your dose. Call your doctor or health care professional if you are unable to keep an appointment. What may interact with this medicine? -certain antibiotics given by injection -NSAIDs, medicines for pain and inflammation, like ibuprofen or naproxen -some diuretics like bumetanide, furosemide -teriparatide -thalidomide This list may not describe all possible interactions. Give your health care provider a list of all the medicines, herbs, non-prescription drugs, or dietary supplements you use. Also tell them if you smoke, drink alcohol, or use illegal drugs. Some items may interact with your medicine. What should I watch for while using this medicine? Visit  your doctor or health care professional for regular checkups. It may be some time before you see the benefit from this medicine. Do not stop taking your medicine unless your doctor tells you to. Your doctor may order blood tests or other tests to see how you are doing. Women should inform their doctor if they wish to become pregnant or think they might be pregnant. There is a potential for serious side effects to an unborn child. Talk to your health care professional or pharmacist for more information. You should make sure that you get enough calcium and vitamin D while you are taking this medicine. Discuss the foods you eat and the vitamins you take with your health care professional. Some people who take this medicine have severe bone, joint, and/or muscle pain. This medicine may also increase your risk for a broken thigh bone. Tell your doctor right away if you have pain in your upper leg or groin. Tell your doctor if you have any pain that does not go away or that gets worse. What side effects may I notice from receiving this medicine? Side effects that you should report to your doctor or health care professional as soon as possible: -allergic reactions like skin rash, itching or hives, swelling of the face, lips, or tongue -anxiety, confusion, or depression -breathing problems -changes in vision -feeling faint or lightheaded, falls -jaw burning, cramping, pain -muscle cramps, stiffness, or weakness -trouble passing urine or change in the amount of urine Side effects that usually do not require medical attention (report to your doctor or health care professional if they continue or are bothersome): -bone, joint, or muscle pain -  fever -hair loss -irritation at site where injected -loss of appetite -nausea, vomiting -stomach upset -tired This list may not describe all possible side effects. Call your doctor for medical advice about side effects. You may report side effects to FDA at  1-800-FDA-1088. Where should I keep my medicine? This drug is given in a hospital or clinic and will not be stored at home. NOTE: This sheet is a summary. It may not cover all possible information. If you have questions about this medicine, talk to your doctor, pharmacist, or health care provider.  2013, [employer]. (12/07/2010 9:06:58 AM)  

## 2012-11-20 NOTE — Telephone Encounter (Signed)
Per staff phone call and POF I have schedueld appts.  JMW  

## 2012-11-20 NOTE — Progress Notes (Signed)
Hematology and Oncology Follow Up Visit  TUNISHA RULAND [id number] April 29, 1951 62 y.o. 11/20/2012 4:03 PM      Principle Diagnosis:  This is a 62 year old female with the following issues:   1. 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  She had a free kappa light chain around 1500. 2. Diagnosis of deep vein thrombosis in July 2011.   Prior Therapy:    1. Initially treated with melphalan and prednisone.  She had a partial response with free light chains down as low as 19, M spike down to 0.52 g/dL.  2. Patient treated with Revlimid at 25 mg 3 weeks on and 1 week off between November 2009 until November 2010.  She had a partial response, light chains down to 33 and M spike was not detected.   3. She was treated on maintenance Revlimid 15 mg to maintain her partial response. 4. She was on Zometa intermittently, on hold know. 5. Currently anticoagulated with Coumadin 7.5 mg since July 2011. 6. She received Velcade 1.3 mg/sq m for a total of 2.9 mg weekly, which started on 02/21/2011 with weekly dexamethasone .  Treatment has been on hold due to recurrent hospitalization for wound infection. Treatment restarted in in 06/2011 till 09/2011. 7. Kyprolis started on 02/04/12. She completed 6 months of therapy on 07/08/12  Current therapy: Watchful observation  Interim History:  Ms. Smithers presents today for a work-in appointment by herself. This is a pleasant 62 year old female with a few comorbid conditions as outlined above. She is reporting increased pain to her lower back and radiates to her bilateral hips, L>R. No neurological symptoms. She is able to ambulate and actually pain is relieved by stretching and ambulation. She takes Percocet 2 tabs every 4 hours which helps to some extent, but pain is not completely relieved.  She does not report any obvious bleeding such as epistaxis, gum bleeding, hemoptysis,  melena, hematochezia or hematuria.  She does have some intermittent swelling in her right lower extremity which is improved now.  She does not report any headaches, visual changes, any dysphagia, odynophagia, any nausea, vomiting, diarrhea, constipation, chest pain, shortness of breath, productive cough, abdominal pain, abdominal swelling, lower extremity paresthesias, bowel or bladder incontinence.  She denies any fevers, chills or night sweats.  Medications: I have reviewed the patient's current medications.  Allergies:  Allergies  Allergen Reactions  . Ibuprofen Other (See Comments)    Patient does not remember.   . Morphine And Related Other (See Comments)    Patient does not remember reaction, believes it caused raised marks on skin.    Past Medical History, Surgical history, Social history, and Family History were reviewed and updated.  Review of Systems: Constitutional:  Negative for fever, chills, night sweats, anorexia, weight loss, pain. Cardiovascular: no chest pain or dyspnea on exertion Respiratory: no cough, shortness of breath, or wheezing Neurological: no TIA or stroke symptoms Dermatological: negative ENT: negative Skin: Negative. Gastrointestinal: no abdominal pain, change in bowel habits, or black or bloody stools Genito-Urinary: no dysuria, trouble voiding, or hematuria Hematological and Lymphatic: negative Breast: negative for breast lumps Musculoskeletal: negative Remaining ROS negative.  Physical Exam: Blood pressure 127/82, pulse 93, temperature 97 F (36.1 C), temperature source Oral, resp. rate 20, height 5\' 5"  (1.651 m), weight 251 lb 14.4 oz (114.261 kg). ECOG: 1 General appearance: alert Head: Normocephalic, without obvious abnormality, atraumatic Neck: no adenopathy, no carotid  bruit, no JVD, supple, symmetrical, trachea midline and thyroid not enlarged, symmetric, no tenderness/mass/nodules Lymph nodes: Cervical, supraclavicular, and axillary nodes  normal. Heart:regular rate and rhythm, S1, S2 normal, no murmur, click, rub or gallop Lung:chest clear, no wheezing, rales, normal symmetric air entry, Heart exam - S1, S2 normal, no murmur, no gallop, rate regular Abdomen: soft, non-tender, without masses or organomegaly. Wound appear have healed. EXT:no erythema, induration, or nodules   Lab Results: Lab Results  Component Value Date   WBC 3.4* 11/20/2012   HGB 13.5 11/20/2012   HCT 41.4 11/20/2012   MCV 85.4 11/20/2012   PLT 89* 11/20/2012     Chemistry      Component Value Date/Time   NA 139 11/20/2012 1355   NA 137 02/11/2012 1445   K 4.7 11/20/2012 1355   K 5.2 02/11/2012 1445   CL 109* 11/20/2012 1355   CL 109 02/11/2012 1445   CO2 25 11/20/2012 1355   CO2 20 02/11/2012 1445   BUN 24.7 11/20/2012 1355   BUN 62* 02/11/2012 1445   CREATININE 1.4* 11/20/2012 1355   CREATININE 2.40* 02/11/2012 1445      Component Value Date/Time   CALCIUM 12.9* 11/20/2012 1355   CALCIUM 9.4 02/11/2012 1445   ALKPHOS 62 11/20/2012 1355   ALKPHOS 57 02/11/2012 1445   AST 54* 11/20/2012 1355   AST 15 02/11/2012 1445   ALT 21 11/20/2012 1355   ALT 12 02/11/2012 1445   BILITOT 0.44 11/20/2012 1355   BILITOT 0.5 02/11/2012 1445      Impression and Plan:  This is a 62 year old female with the following issues:  1. Light chain multiple myeloma. Recently completed Kyprolis with improvement in light chains.Light chains were up to 500 at her last visit and are pending today. Recommend restarting kyprolis next week.  2. Hypercalcemia. Zometa given today. 3. Pain in her hips/legs: Prior plain film x-rays show multiple myeloma. I have given a prescription for fentanyl patches with instructions on how to use this medication. Continue Percocet as needed for pain. 4. Anemia, multifactorial. Hemoglobin has normalized. 5. History of deep vein thrombosis. Previously anticoagulated with Coumadin. No new clots noted. No more anticoagulation at this time.  6. Hypertension  seems to be under relatively good control.  7. IV access: PAC in place.  8. Follow up. Next week as previously scheduled to begin chemotherapy.  Case reviewed with Dr Clelia Croft.  [place], [place] 5/30/20144:03 PM

## 2012-11-23 ENCOUNTER — Encounter: Payer: Self-pay | Admitting: Oncology

## 2012-11-23 NOTE — Progress Notes (Signed)
Faxed pa form to [facility] for fentanyl patches.

## 2012-11-24 LAB — PROTEIN ELECTROPHORESIS, SERUM
Albumin ELP: 48.4 % — ABNORMAL LOW (ref 55.8–66.1)
Alpha-1-Globulin: 4.9 % (ref 2.9–4.9)
Alpha-2-Globulin: 11.9 % — ABNORMAL HIGH (ref 7.1–11.8)
Beta 2: 2.4 % — ABNORMAL LOW (ref 3.2–6.5)
Gamma Globulin: 27.6 % — ABNORMAL HIGH (ref 11.1–18.8)

## 2012-11-24 LAB — KAPPA/LAMBDA LIGHT CHAINS: Lambda Free Lght Chn: 0.13 mg/dL — ABNORMAL LOW (ref 0.57–2.63)

## 2012-11-25 ENCOUNTER — Telehealth: Payer: Self-pay | Admitting: Oncology

## 2012-11-25 ENCOUNTER — Encounter: Payer: Self-pay | Admitting: Oncology

## 2012-11-25 ENCOUNTER — Ambulatory Visit (HOSPITAL_BASED_OUTPATIENT_CLINIC_OR_DEPARTMENT_OTHER): Payer: Medicaid Other | Admitting: Oncology

## 2012-11-25 ENCOUNTER — Ambulatory Visit (HOSPITAL_BASED_OUTPATIENT_CLINIC_OR_DEPARTMENT_OTHER): Payer: Medicaid Other

## 2012-11-25 ENCOUNTER — Other Ambulatory Visit: Payer: Medicaid Other | Admitting: Lab

## 2012-11-25 VITALS — BP 133/72 | HR 87 | Temp 96.5°F | Resp 20 | Ht 65.0 in | Wt 249.8 lb

## 2012-11-25 DIAGNOSIS — D649 Anemia, unspecified: Secondary | ICD-10-CM

## 2012-11-25 DIAGNOSIS — C9000 Multiple myeloma not having achieved remission: Secondary | ICD-10-CM

## 2012-11-25 DIAGNOSIS — M25559 Pain in unspecified hip: Secondary | ICD-10-CM

## 2012-11-25 DIAGNOSIS — Z5112 Encounter for antineoplastic immunotherapy: Secondary | ICD-10-CM

## 2012-11-25 DIAGNOSIS — Z86718 Personal history of other venous thrombosis and embolism: Secondary | ICD-10-CM

## 2012-11-25 MED ORDER — CARFILZOMIB CHEMO INJECTION 60 MG
20.0000 mg/m2 | Freq: Once | INTRAVENOUS | Status: AC
  Administered 2012-11-25: 46 mg via INTRAVENOUS
  Filled 2012-11-25: qty 23

## 2012-11-25 MED ORDER — SODIUM CHLORIDE 0.9 % IJ SOLN
10.0000 mL | INTRAMUSCULAR | Status: DC | PRN
  Administered 2012-11-25: 10 mL
  Filled 2012-11-25: qty 10

## 2012-11-25 MED ORDER — DEXAMETHASONE SODIUM PHOSPHATE 10 MG/ML IJ SOLN
10.0000 mg | Freq: Once | INTRAMUSCULAR | Status: AC
  Administered 2012-11-25: 10 mg via INTRAVENOUS

## 2012-11-25 MED ORDER — ONDANSETRON 8 MG/50ML IVPB (CHCC)
8.0000 mg | Freq: Once | INTRAVENOUS | Status: AC
  Administered 2012-11-25: 8 mg via INTRAVENOUS

## 2012-11-25 MED ORDER — SODIUM CHLORIDE 0.9 % IV SOLN: Freq: Once | INTRAVENOUS | Status: DC

## 2012-11-25 MED ORDER — SODIUM CHLORIDE 0.9 % IV SOLN
Freq: Once | INTRAVENOUS | Status: AC
  Administered 2012-11-25: 16:00:00 via INTRAVENOUS

## 2012-11-25 MED ORDER — HEPARIN SOD (PORK) LOCK FLUSH 100 UNIT/ML IV SOLN
500.0000 [IU] | Freq: Once | INTRAVENOUS | Status: AC | PRN
  Administered 2012-11-25: 500 [IU]
  Filled 2012-11-25: qty 5

## 2012-11-25 NOTE — Progress Notes (Signed)
Hematology and Oncology Follow Up Visit  [name] [id number] 1950-11-16 62 y.o. 11/25/2012 3:19 PM      Principle Diagnosis:  This is a 62 year old female with the following issues:   1. 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  She had a free kappa light chain around 1500. 2. Diagnosis of deep vein thrombosis in July 2011.   Prior Therapy:    1. Initially treated with melphalan and prednisone.  She had a partial response with free light chains down as low as 19, M spike down to 0.52 g/dL.  2. Patient treated with Revlimid at 25 mg 3 weeks on and 1 week off between November 2009 until November 2010.  She had a partial response, light chains down to 33 and M spike was not detected.   3. She was treated on maintenance Revlimid 15 mg to maintain her partial response. 4. She was on Zometa intermittently, on hold know. 5. Currently anticoagulated with Coumadin 7.5 mg since July 2011. 6. She received Velcade 1.3 mg/sq m for a total of 2.9 mg weekly, which started on 02/21/2011 with weekly dexamethasone .  Treatment has been on hold due to recurrent hospitalization for wound infection. Treatment restarted in in 06/2011 till 09/2011. 7. Kyprolis started on 02/04/12. She completed 6 months of therapy on 07/08/12  Current therapy: She to resume Kyprolis today.   Interim History:  [name] presents today for an evaluation before the start of her chemotherapy.  This is a pleasant 62 year old female with a few comorbid conditions as outlined above. She is reporting increased pain to her lower back and radiates to her bilateral hips, L>R. No neurological symptoms. She is able to ambulate and actually pain is relieved by stretching and ambulation. She takes Percocet 2 tabs every 4 hours which helps to some extent, but pain is not completely relieved. She has not used the Fentanyl patch yet.   Medications: I  have reviewed the patient's current medications.  Allergies:  Allergies  Allergen Reactions  . Ibuprofen Other (See Comments)    Patient does not remember.   . Morphine And Related Other (See Comments)    Patient does not remember reaction, believes it caused raised marks on skin.    Past Medical History, Surgical history, Social history, and Family History were reviewed and updated.  Review of Systems: Constitutional:  Negative for fever, chills, night sweats, anorexia, weight loss, pain. Cardiovascular: no chest pain or dyspnea on exertion Respiratory: no cough, shortness of breath, or wheezing Neurological: no TIA or stroke symptoms Dermatological: negative ENT: negative Skin: Negative. Gastrointestinal: no abdominal pain, change in bowel habits, or black or bloody stools Genito-Urinary: no dysuria, trouble voiding, or hematuria Hematological and Lymphatic: negative Breast: negative for breast lumps Musculoskeletal: negative Remaining ROS negative.  Physical Exam: Blood pressure 133/72, pulse 87, temperature 96.5 F (35.8 C), temperature source Oral, resp. rate 20, height 5\' 5"  (1.651 m), weight 249 lb 12.8 oz (113.309 kg). ECOG: 1 General appearance: alert Head: Normocephalic, without obvious abnormality, atraumatic Neck: no adenopathy, no carotid bruit, no JVD, supple, symmetrical, trachea midline and thyroid not enlarged, symmetric, no tenderness/mass/nodules Lymph nodes: Cervical, supraclavicular, and axillary nodes normal. Heart:regular rate and rhythm, S1, S2 normal, no murmur, click, rub or gallop Lung:chest clear, no wheezing, rales, normal symmetric air entry, Heart exam - S1, S2 normal, no murmur, no gallop, rate regular Abdomen: soft, non-tender, without masses  or organomegaly. Wound appear have healed. EXT:no erythema, induration, or nodules   Lab Results: Lab Results  Component Value Date   WBC 3.4* 11/20/2012   HGB 13.5 11/20/2012   HCT 41.4 11/20/2012    MCV 85.4 11/20/2012   PLT 89* 11/20/2012     Chemistry      Component Value Date/Time   NA 139 11/20/2012 1355   NA 137 02/11/2012 1445   K 4.7 11/20/2012 1355   K 5.2 02/11/2012 1445   CL 109* 11/20/2012 1355   CL 109 02/11/2012 1445   CO2 25 11/20/2012 1355   CO2 20 02/11/2012 1445   BUN 24.7 11/20/2012 1355   BUN 62* 02/11/2012 1445   CREATININE 1.4* 11/20/2012 1355   CREATININE 2.40* 02/11/2012 1445      Component Value Date/Time   CALCIUM 12.9* 11/20/2012 1355   CALCIUM 9.4 02/11/2012 1445   ALKPHOS 62 11/20/2012 1355   ALKPHOS 57 02/11/2012 1445   AST 54* 11/20/2012 1355   AST 15 02/11/2012 1445   ALT 21 11/20/2012 1355   ALT 12 02/11/2012 1445   BILITOT 0.44 11/20/2012 1355   BILITOT 0.5 02/11/2012 1445      Impression and Plan:  This is a 62 year old female with the following issues:  1. Light chain multiple myeloma. Recently completed Kyprolis with improvement in light chains.Light chains were up to 1200 at her last visit. I and recommend restarting kyprolis Today. Risks and benefits discussed again and she is ready to proceed.  2. Hypercalcemia. Zometa given on 5/30.  3. Pain in her hips/legs: Prior plain film x-rays show multiple myeloma. Fentanyl patches has not started yet. She is to continue Percocet as needed for pain. 4. Anemia, multifactorial. Hemoglobin has normalized. 5. History of deep vein thrombosis. Previously anticoagulated with Coumadin. No new clots noted. No more anticoagulation at this time.  6. Hypertension seems to be under relatively good control.  7. IV access: PAC in place.  8. Follow up. On 7/1 after the completion of this cycle of chemotherapy.     [facility] 6/4/20143:19 PM

## 2012-11-25 NOTE — Patient Instructions (Addendum)
[facility] Discharge Instructions for Patients Receiving Chemotherapy  Today you received the following chemotherapy agents Kyprolis To help prevent nausea and vomiting after your treatment, we encourage you to take your nausea medication as prescribed.  If you develop nausea and vomiting that is not controlled by your nausea medication, call the clinic.   BELOW ARE SYMPTOMS THAT SHOULD BE REPORTED IMMEDIATELY:  *FEVER GREATER THAN 100.5 F  *CHILLS WITH OR WITHOUT FEVER  NAUSEA AND VOMITING THAT IS NOT CONTROLLED WITH YOUR NAUSEA MEDICATION  *UNUSUAL SHORTNESS OF BREATH  *UNUSUAL BRUISING OR BLEEDING  TENDERNESS IN MOUTH AND THROAT WITH OR WITHOUT PRESENCE OF ULCERS  *URINARY PROBLEMS  *BOWEL PROBLEMS  UNUSUAL RASH Items with * indicate a potential emergency and should be followed up as soon as possible.  Feel free to call the clinic you have any questions or concerns. The clinic phone number is [phone].    

## 2012-11-25 NOTE — Progress Notes (Signed)
[name] approved 10 fentanyl patches from 11/25/12-11/25/13 auth # [id number].

## 2012-11-25 NOTE — Telephone Encounter (Signed)
gv pt appt schedule for June and July.  °

## 2012-11-26 ENCOUNTER — Ambulatory Visit (HOSPITAL_BASED_OUTPATIENT_CLINIC_OR_DEPARTMENT_OTHER): Payer: Medicaid Other

## 2012-11-26 VITALS — BP 109/66 | HR 87 | Temp 97.1°F | Resp 18

## 2012-11-26 DIAGNOSIS — Z5112 Encounter for antineoplastic immunotherapy: Secondary | ICD-10-CM

## 2012-11-26 DIAGNOSIS — C9000 Multiple myeloma not having achieved remission: Secondary | ICD-10-CM

## 2012-11-26 MED ORDER — SODIUM CHLORIDE 0.9 % IJ SOLN
10.0000 mL | INTRAMUSCULAR | Status: DC | PRN
  Administered 2012-11-26: 10 mL
  Filled 2012-11-26: qty 10

## 2012-11-26 MED ORDER — HEPARIN SOD (PORK) LOCK FLUSH 100 UNIT/ML IV SOLN
500.0000 [IU] | Freq: Once | INTRAVENOUS | Status: AC | PRN
  Administered 2012-11-26: 500 [IU]
  Filled 2012-11-26: qty 5

## 2012-11-26 MED ORDER — DEXAMETHASONE SODIUM PHOSPHATE 10 MG/ML IJ SOLN
10.0000 mg | Freq: Once | INTRAMUSCULAR | Status: AC
  Administered 2012-11-26: 10 mg via INTRAVENOUS

## 2012-11-26 MED ORDER — SODIUM CHLORIDE 0.9 % IV SOLN
Freq: Once | INTRAVENOUS | Status: AC
  Administered 2012-11-26: 04:00:00 via INTRAVENOUS

## 2012-11-26 MED ORDER — ONDANSETRON 8 MG/50ML IVPB (CHCC)
8.0000 mg | Freq: Once | INTRAVENOUS | Status: AC
Start: 2012-11-26 — End: 2012-11-26
  Administered 2012-11-26: 8 mg via INTRAVENOUS

## 2012-11-26 MED ORDER — DEXTROSE 5 % IV SOLN
20.0000 mg/m2 | Freq: Once | INTRAVENOUS | Status: AC
  Administered 2012-11-26: 46 mg via INTRAVENOUS
  Filled 2012-11-26: qty 23

## 2012-11-26 NOTE — Patient Instructions (Addendum)
[facility] Discharge Instructions for Patients Receiving Chemotherapy  Today you received the following chemotherapy agents Kyprolis To help prevent nausea and vomiting after your treatment, we encourage you to take your nausea medication as prescribed.  If you develop nausea and vomiting that is not controlled by your nausea medication, call the clinic.   BELOW ARE SYMPTOMS THAT SHOULD BE REPORTED IMMEDIATELY:  *FEVER GREATER THAN 100.5 F  *CHILLS WITH OR WITHOUT FEVER  NAUSEA AND VOMITING THAT IS NOT CONTROLLED WITH YOUR NAUSEA MEDICATION  *UNUSUAL SHORTNESS OF BREATH  *UNUSUAL BRUISING OR BLEEDING  TENDERNESS IN MOUTH AND THROAT WITH OR WITHOUT PRESENCE OF ULCERS  *URINARY PROBLEMS  *BOWEL PROBLEMS  UNUSUAL RASH Items with * indicate a potential emergency and should be followed up as soon as possible.  Feel free to call the clinic you have any questions or concerns. The clinic phone number is [phone].    

## 2012-12-02 ENCOUNTER — Telehealth: Payer: Self-pay | Admitting: *Deleted

## 2012-12-02 ENCOUNTER — Ambulatory Visit (HOSPITAL_BASED_OUTPATIENT_CLINIC_OR_DEPARTMENT_OTHER): Payer: Medicaid Other

## 2012-12-02 ENCOUNTER — Other Ambulatory Visit (HOSPITAL_BASED_OUTPATIENT_CLINIC_OR_DEPARTMENT_OTHER): Payer: Medicaid Other | Admitting: Lab

## 2012-12-02 ENCOUNTER — Other Ambulatory Visit: Payer: Self-pay | Admitting: *Deleted

## 2012-12-02 VITALS — BP 110/68 | HR 86 | Temp 98.5°F | Resp 20

## 2012-12-02 DIAGNOSIS — C9000 Multiple myeloma not having achieved remission: Secondary | ICD-10-CM

## 2012-12-02 DIAGNOSIS — Z5112 Encounter for antineoplastic immunotherapy: Secondary | ICD-10-CM

## 2012-12-02 LAB — COMPREHENSIVE METABOLIC PANEL (CC13)
Alkaline Phosphatase: 58 U/L (ref 40–150)
BUN: 24.6 mg/dL (ref 7.0–26.0)
CO2: 23 mEq/L (ref 22–29)
Creatinine: 1.6 mg/dL — ABNORMAL HIGH (ref 0.6–1.1)
Glucose: 119 mg/dl — ABNORMAL HIGH (ref 70–99)
Total Bilirubin: 0.61 mg/dL (ref 0.20–1.20)

## 2012-12-02 LAB — CBC WITH DIFFERENTIAL/PLATELET
Basophils Absolute: 0 10*3/uL (ref 0.0–0.1)
Eosinophils Absolute: 0 10*3/uL (ref 0.0–0.5)
HCT: 39.5 % (ref 34.8–46.6)
HGB: 13.4 g/dL (ref 11.6–15.9)
LYMPH%: 25.7 % (ref 14.0–49.7)
MCV: 82.6 fL (ref 79.5–101.0)
MONO#: 0.5 10*3/uL (ref 0.1–0.9)
MONO%: 11.3 % (ref 0.0–14.0)
NEUT#: 2.5 10*3/uL (ref 1.5–6.5)
NEUT%: 62.4 % (ref 38.4–76.8)
Platelets: 94 10*3/uL — ABNORMAL LOW (ref 145–400)
RBC: 4.79 10*6/uL (ref 3.70–5.45)

## 2012-12-02 MED ORDER — HEPARIN SOD (PORK) LOCK FLUSH 100 UNIT/ML IV SOLN
500.0000 [IU] | Freq: Once | INTRAVENOUS | Status: AC | PRN
  Administered 2012-12-02: 500 [IU]
  Filled 2012-12-02: qty 5

## 2012-12-02 MED ORDER — OXYCODONE-ACETAMINOPHEN 5-325 MG PO TABS: 1.0000 | ORAL_TABLET | ORAL | Status: DC | PRN

## 2012-12-02 MED ORDER — DEXAMETHASONE SODIUM PHOSPHATE 10 MG/ML IJ SOLN
10.0000 mg | Freq: Once | INTRAMUSCULAR | Status: AC
  Administered 2012-12-02: 10 mg via INTRAVENOUS

## 2012-12-02 MED ORDER — ONDANSETRON 8 MG/50ML IVPB (CHCC)
8.0000 mg | Freq: Once | INTRAVENOUS | Status: AC
  Administered 2012-12-02: 8 mg via INTRAVENOUS

## 2012-12-02 MED ORDER — SODIUM CHLORIDE 0.9 % IV SOLN
Freq: Once | INTRAVENOUS | Status: AC
  Administered 2012-12-02: 15:00:00 via INTRAVENOUS

## 2012-12-02 MED ORDER — DEXTROSE 5 % IV SOLN
20.0000 mg/m2 | Freq: Once | INTRAVENOUS | Status: AC
  Administered 2012-12-02: 46 mg via INTRAVENOUS
  Filled 2012-12-02: qty 23

## 2012-12-02 MED ORDER — SODIUM CHLORIDE 0.9 % IJ SOLN
10.0000 mL | INTRAMUSCULAR | Status: DC | PRN
  Administered 2012-12-02: 10 mL
  Filled 2012-12-02: qty 10

## 2012-12-02 MED ORDER — SODIUM CHLORIDE 0.9 % IV SOLN: Freq: Once | INTRAVENOUS | Status: DC

## 2012-12-02 NOTE — Telephone Encounter (Signed)
Signed script for percocet given to patient in infusion, for break-thru pain.

## 2012-12-02 NOTE — Telephone Encounter (Signed)
appts made and printed...td 

## 2012-12-02 NOTE — Patient Instructions (Addendum)
[facility] Discharge Instructions for Patients Receiving Chemotherapy  Today you received the following chemotherapy agents Kyprolis To help prevent nausea and vomiting after your treatment, we encourage you to take your nausea medication as prescribed.  If you develop nausea and vomiting that is not controlled by your nausea medication, call the clinic.   BELOW ARE SYMPTOMS THAT SHOULD BE REPORTED IMMEDIATELY:  *FEVER GREATER THAN 100.5 F  *CHILLS WITH OR WITHOUT FEVER  NAUSEA AND VOMITING THAT IS NOT CONTROLLED WITH YOUR NAUSEA MEDICATION  *UNUSUAL SHORTNESS OF BREATH  *UNUSUAL BRUISING OR BLEEDING  TENDERNESS IN MOUTH AND THROAT WITH OR WITHOUT PRESENCE OF ULCERS  *URINARY PROBLEMS  *BOWEL PROBLEMS  UNUSUAL RASH Items with * indicate a potential emergency and should be followed up as soon as possible.  Feel free to call the clinic you have any questions or concerns. The clinic phone number is [phone].    

## 2012-12-03 ENCOUNTER — Ambulatory Visit (HOSPITAL_BASED_OUTPATIENT_CLINIC_OR_DEPARTMENT_OTHER): Payer: Medicaid Other

## 2012-12-03 DIAGNOSIS — C9000 Multiple myeloma not having achieved remission: Secondary | ICD-10-CM

## 2012-12-03 DIAGNOSIS — Z5112 Encounter for antineoplastic immunotherapy: Secondary | ICD-10-CM

## 2012-12-03 MED ORDER — SODIUM CHLORIDE 0.9 % IV SOLN: Freq: Once | INTRAVENOUS | Status: DC

## 2012-12-03 MED ORDER — DEXTROSE 5 % IV SOLN
20.0000 mg/m2 | Freq: Once | INTRAVENOUS | Status: AC
  Administered 2012-12-03: 46 mg via INTRAVENOUS
  Filled 2012-12-03: qty 23

## 2012-12-03 MED ORDER — SODIUM CHLORIDE 0.9 % IV SOLN
Freq: Once | INTRAVENOUS | Status: AC
  Administered 2012-12-03: 15:00:00 via INTRAVENOUS

## 2012-12-03 MED ORDER — DEXAMETHASONE SODIUM PHOSPHATE 10 MG/ML IJ SOLN
10.0000 mg | Freq: Once | INTRAMUSCULAR | Status: AC
  Administered 2012-12-03: 10 mg via INTRAVENOUS

## 2012-12-03 MED ORDER — SODIUM CHLORIDE 0.9 % IJ SOLN
10.0000 mL | INTRAMUSCULAR | Status: DC | PRN
  Administered 2012-12-03: 10 mL
  Filled 2012-12-03: qty 10

## 2012-12-03 MED ORDER — ONDANSETRON 8 MG/50ML IVPB (CHCC)
8.0000 mg | Freq: Once | INTRAVENOUS | Status: AC
  Administered 2012-12-03: 8 mg via INTRAVENOUS

## 2012-12-03 MED ORDER — HEPARIN SOD (PORK) LOCK FLUSH 100 UNIT/ML IV SOLN
500.0000 [IU] | Freq: Once | INTRAVENOUS | Status: AC | PRN
  Administered 2012-12-03: 500 [IU]
  Filled 2012-12-03: qty 5

## 2012-12-03 NOTE — Patient Instructions (Addendum)
[facility] Discharge Instructions for Patients Receiving Chemotherapy  Today you received the following chemotherapy agents Kyprolis To help prevent nausea and vomiting after your treatment, we encourage you to take your nausea medication as prescribed.  If you develop nausea and vomiting that is not controlled by your nausea medication, call the clinic.   BELOW ARE SYMPTOMS THAT SHOULD BE REPORTED IMMEDIATELY:  *FEVER GREATER THAN 100.5 F  *CHILLS WITH OR WITHOUT FEVER  NAUSEA AND VOMITING THAT IS NOT CONTROLLED WITH YOUR NAUSEA MEDICATION  *UNUSUAL SHORTNESS OF BREATH  *UNUSUAL BRUISING OR BLEEDING  TENDERNESS IN MOUTH AND THROAT WITH OR WITHOUT PRESENCE OF ULCERS  *URINARY PROBLEMS  *BOWEL PROBLEMS  UNUSUAL RASH Items with * indicate a potential emergency and should be followed up as soon as possible.  Feel free to call the clinic you have any questions or concerns. The clinic phone number is [phone].    

## 2012-12-09 ENCOUNTER — Ambulatory Visit (HOSPITAL_BASED_OUTPATIENT_CLINIC_OR_DEPARTMENT_OTHER): Payer: Medicaid Other

## 2012-12-09 ENCOUNTER — Other Ambulatory Visit: Payer: Self-pay | Admitting: *Deleted

## 2012-12-09 ENCOUNTER — Other Ambulatory Visit (HOSPITAL_BASED_OUTPATIENT_CLINIC_OR_DEPARTMENT_OTHER): Payer: Medicaid Other | Admitting: Lab

## 2012-12-09 VITALS — BP 118/59 | HR 72 | Temp 97.4°F

## 2012-12-09 DIAGNOSIS — Z5111 Encounter for antineoplastic chemotherapy: Secondary | ICD-10-CM

## 2012-12-09 DIAGNOSIS — C9000 Multiple myeloma not having achieved remission: Secondary | ICD-10-CM

## 2012-12-09 LAB — CBC WITH DIFFERENTIAL/PLATELET
BASO%: 0.4 % (ref 0.0–2.0)
Eosinophils Absolute: 0 10*3/uL (ref 0.0–0.5)
HCT: 38.3 % (ref 34.8–46.6)
MCHC: 33.9 g/dL (ref 31.5–36.0)
MONO#: 0.4 10*3/uL (ref 0.1–0.9)
NEUT#: 2.6 10*3/uL (ref 1.5–6.5)
NEUT%: 66.8 % (ref 38.4–76.8)
RBC: 4.67 10*6/uL (ref 3.70–5.45)
WBC: 3.9 10*3/uL (ref 3.9–10.3)
lymph#: 0.9 10*3/uL (ref 0.9–3.3)

## 2012-12-09 MED ORDER — DEXTROSE 5 % IV SOLN
20.0000 mg/m2 | Freq: Once | INTRAVENOUS | Status: AC
  Administered 2012-12-09: 46 mg via INTRAVENOUS
  Filled 2012-12-09: qty 23

## 2012-12-09 MED ORDER — DEXAMETHASONE SODIUM PHOSPHATE 10 MG/ML IJ SOLN
10.0000 mg | Freq: Once | INTRAMUSCULAR | Status: AC
  Administered 2012-12-09: 10 mg via INTRAVENOUS

## 2012-12-09 MED ORDER — SODIUM CHLORIDE 0.9 % IV SOLN
Freq: Once | INTRAVENOUS | Status: AC
  Administered 2012-12-09: 15:00:00 via INTRAVENOUS

## 2012-12-09 MED ORDER — FENTANYL 50 MCG/HR TD PT72: 1.0000 | MEDICATED_PATCH | TRANSDERMAL | Status: DC

## 2012-12-09 MED ORDER — SODIUM CHLORIDE 0.9 % IJ SOLN
10.0000 mL | INTRAMUSCULAR | Status: DC | PRN
  Administered 2012-12-09: 10 mL
  Filled 2012-12-09: qty 10

## 2012-12-09 MED ORDER — HEPARIN SOD (PORK) LOCK FLUSH 100 UNIT/ML IV SOLN
500.0000 [IU] | Freq: Once | INTRAVENOUS | Status: AC | PRN
  Administered 2012-12-09: 500 [IU]
  Filled 2012-12-09: qty 5

## 2012-12-09 MED ORDER — ONDANSETRON 8 MG/50ML IVPB (CHCC)
8.0000 mg | Freq: Once | INTRAVENOUS | Status: AC
  Administered 2012-12-09: 8 mg via INTRAVENOUS

## 2012-12-09 NOTE — Patient Instructions (Addendum)
[facility] Discharge Instructions for Patients Receiving Chemotherapy  Today you received the following chemotherapy agents:  Kyprolis  To help prevent nausea and vomiting after your treatment, we encourage you to take your nausea medication as ordered per MD.   If you develop nausea and vomiting that is not controlled by your nausea medication, call the clinic.   BELOW ARE SYMPTOMS THAT SHOULD BE REPORTED IMMEDIATELY:  *FEVER GREATER THAN 100.5 F  *CHILLS WITH OR WITHOUT FEVER  NAUSEA AND VOMITING THAT IS NOT CONTROLLED WITH YOUR NAUSEA MEDICATION  *UNUSUAL SHORTNESS OF BREATH  *UNUSUAL BRUISING OR BLEEDING  TENDERNESS IN MOUTH AND THROAT WITH OR WITHOUT PRESENCE OF ULCERS  *URINARY PROBLEMS  *BOWEL PROBLEMS  UNUSUAL RASH Items with * indicate a potential emergency and should be followed up as soon as possible.  Feel free to call the clinic you have any questions or concerns. The clinic phone number is [phone].    

## 2012-12-09 NOTE — Telephone Encounter (Signed)
Patient here for chemotherapy today and is c/o increased pain. Fentanyl 25 mcg not strong enough. Per kristin, ok for fentanyl 25 mcg patch. Script given to patient. Until prior auth given from [place], patient may use 2 -  25 mcg patches. Patient and caregiver verbalize understanding.

## 2012-12-10 ENCOUNTER — Ambulatory Visit (HOSPITAL_BASED_OUTPATIENT_CLINIC_OR_DEPARTMENT_OTHER): Payer: Medicaid Other

## 2012-12-10 VITALS — BP 123/62 | HR 71 | Temp 98.1°F | Resp 20

## 2012-12-10 DIAGNOSIS — C9000 Multiple myeloma not having achieved remission: Secondary | ICD-10-CM

## 2012-12-10 DIAGNOSIS — Z5112 Encounter for antineoplastic immunotherapy: Secondary | ICD-10-CM

## 2012-12-10 MED ORDER — DEXTROSE 5 % IV SOLN
20.0000 mg/m2 | Freq: Once | INTRAVENOUS | Status: AC
  Administered 2012-12-10: 46 mg via INTRAVENOUS
  Filled 2012-12-10: qty 23

## 2012-12-10 MED ORDER — SODIUM CHLORIDE 0.9 % IJ SOLN
10.0000 mL | INTRAMUSCULAR | Status: DC | PRN
  Administered 2012-12-10: 10 mL
  Filled 2012-12-10: qty 10

## 2012-12-10 MED ORDER — SODIUM CHLORIDE 0.9 % IV SOLN
Freq: Once | INTRAVENOUS | Status: AC
  Administered 2012-12-10: 15:00:00 via INTRAVENOUS

## 2012-12-10 MED ORDER — HEPARIN SOD (PORK) LOCK FLUSH 100 UNIT/ML IV SOLN
500.0000 [IU] | Freq: Once | INTRAVENOUS | Status: AC | PRN
  Administered 2012-12-10: 500 [IU]
  Filled 2012-12-10: qty 5

## 2012-12-10 MED ORDER — ONDANSETRON 8 MG/50ML IVPB (CHCC)
8.0000 mg | Freq: Once | INTRAVENOUS | Status: AC
  Administered 2012-12-10: 8 mg via INTRAVENOUS

## 2012-12-10 MED ORDER — DEXAMETHASONE SODIUM PHOSPHATE 10 MG/ML IJ SOLN
10.0000 mg | Freq: Once | INTRAMUSCULAR | Status: AC
  Administered 2012-12-10: 10 mg via INTRAVENOUS

## 2012-12-10 NOTE — Patient Instructions (Addendum)
[facility] Discharge Instructions for Patients Receiving Chemotherapy  Today you received the following chemotherapy agents Kyprolis To help prevent nausea and vomiting after your treatment, we encourage you to take your nausea medication as prescribed.  If you develop nausea and vomiting that is not controlled by your nausea medication, call the clinic.   BELOW ARE SYMPTOMS THAT SHOULD BE REPORTED IMMEDIATELY:  *FEVER GREATER THAN 100.5 F  *CHILLS WITH OR WITHOUT FEVER  NAUSEA AND VOMITING THAT IS NOT CONTROLLED WITH YOUR NAUSEA MEDICATION  *UNUSUAL SHORTNESS OF BREATH  *UNUSUAL BRUISING OR BLEEDING  TENDERNESS IN MOUTH AND THROAT WITH OR WITHOUT PRESENCE OF ULCERS  *URINARY PROBLEMS  *BOWEL PROBLEMS  UNUSUAL RASH Items with * indicate a potential emergency and should be followed up as soon as possible.  Feel free to call the clinic you have any questions or concerns. The clinic phone number is [phone].    

## 2012-12-21 ENCOUNTER — Encounter: Payer: Self-pay | Admitting: *Deleted

## 2012-12-21 ENCOUNTER — Encounter: Payer: Self-pay | Admitting: Oncology

## 2012-12-21 NOTE — Progress Notes (Signed)
RECEIVED A FAX FROM [facility] CONCERNING A PRIOR AUTHORIZATION FOR FENTANYL PATCH. THIS REQUEST WAS GIVEN TO MANAGED CARE.

## 2012-12-21 NOTE — Progress Notes (Signed)
Faxed fentanyl patch pa form to [facility] °

## 2012-12-22 ENCOUNTER — Ambulatory Visit (HOSPITAL_BASED_OUTPATIENT_CLINIC_OR_DEPARTMENT_OTHER): Payer: Medicaid Other | Admitting: Oncology

## 2012-12-22 ENCOUNTER — Ambulatory Visit (HOSPITAL_BASED_OUTPATIENT_CLINIC_OR_DEPARTMENT_OTHER): Payer: Medicaid Other

## 2012-12-22 ENCOUNTER — Telehealth: Payer: Self-pay | Admitting: Oncology

## 2012-12-22 ENCOUNTER — Encounter: Payer: Self-pay | Admitting: Oncology

## 2012-12-22 ENCOUNTER — Other Ambulatory Visit (HOSPITAL_BASED_OUTPATIENT_CLINIC_OR_DEPARTMENT_OTHER): Payer: Medicaid Other | Admitting: Lab

## 2012-12-22 VITALS — BP 135/62 | HR 91 | Temp 98.3°F | Resp 18 | Ht 65.0 in

## 2012-12-22 DIAGNOSIS — L02211 Cutaneous abscess of abdominal wall: Secondary | ICD-10-CM

## 2012-12-22 DIAGNOSIS — K436 Other and unspecified ventral hernia with obstruction, without gangrene: Secondary | ICD-10-CM

## 2012-12-22 DIAGNOSIS — C9002 Multiple myeloma in relapse: Secondary | ICD-10-CM

## 2012-12-22 DIAGNOSIS — C9000 Multiple myeloma not having achieved remission: Secondary | ICD-10-CM

## 2012-12-22 DIAGNOSIS — K430 Incisional hernia with obstruction, without gangrene: Secondary | ICD-10-CM

## 2012-12-22 DIAGNOSIS — Z5112 Encounter for antineoplastic immunotherapy: Secondary | ICD-10-CM

## 2012-12-22 LAB — CBC WITH DIFFERENTIAL/PLATELET
BASO%: 0.9 % (ref 0.0–2.0)
Basophils Absolute: 0 10*3/uL (ref 0.0–0.1)
EOS%: 0.2 % (ref 0.0–7.0)
HGB: 12.8 g/dL (ref 11.6–15.9)
MCH: 27.9 pg (ref 25.1–34.0)
MCHC: 33.3 g/dL (ref 31.5–36.0)
MONO#: 0.3 10*3/uL (ref 0.1–0.9)
RDW: 17.4 % — ABNORMAL HIGH (ref 11.2–14.5)
WBC: 2.9 10*3/uL — ABNORMAL LOW (ref 3.9–10.3)
lymph#: 1 10*3/uL (ref 0.9–3.3)

## 2012-12-22 LAB — COMPREHENSIVE METABOLIC PANEL (CC13)
ALT: 12 U/L (ref 0–55)
AST: 20 U/L (ref 5–34)
Albumin: 2.9 g/dL — ABNORMAL LOW (ref 3.5–5.0)
CO2: 27 mEq/L (ref 22–29)
Calcium: 9.5 mg/dL (ref 8.4–10.4)
Chloride: 110 mEq/L — ABNORMAL HIGH (ref 98–109)
Potassium: 4 mEq/L (ref 3.5–5.1)

## 2012-12-22 MED ORDER — SODIUM CHLORIDE 0.9 % IJ SOLN
10.0000 mL | INTRAMUSCULAR | Status: DC | PRN
  Administered 2012-12-22: 10 mL
  Filled 2012-12-22: qty 10

## 2012-12-22 MED ORDER — OXYCODONE-ACETAMINOPHEN 5-325 MG PO TABS: 1.0000 | ORAL_TABLET | ORAL | Status: DC | PRN

## 2012-12-22 MED ORDER — FENTANYL 25 MCG/HR TD PT72: 1.0000 | MEDICATED_PATCH | TRANSDERMAL | Status: DC

## 2012-12-22 MED ORDER — SODIUM CHLORIDE 0.9 % IV SOLN: Freq: Once | INTRAVENOUS | Status: DC

## 2012-12-22 MED ORDER — ZOLEDRONIC ACID 4 MG/100ML IV SOLN
4.0000 mg | Freq: Once | INTRAVENOUS | Status: AC
  Administered 2012-12-22: 4 mg via INTRAVENOUS
  Filled 2012-12-22: qty 100

## 2012-12-22 MED ORDER — HEPARIN SOD (PORK) LOCK FLUSH 100 UNIT/ML IV SOLN
500.0000 [IU] | Freq: Once | INTRAVENOUS | Status: AC | PRN
  Administered 2012-12-22: 500 [IU]
  Filled 2012-12-22: qty 5

## 2012-12-22 MED ORDER — DEXAMETHASONE SODIUM PHOSPHATE 10 MG/ML IJ SOLN
10.0000 mg | Freq: Once | INTRAMUSCULAR | Status: AC
  Administered 2012-12-22: 10 mg via INTRAVENOUS

## 2012-12-22 MED ORDER — ONDANSETRON HCL 8 MG PO TABS: 8.0000 mg | ORAL_TABLET | Freq: Three times a day (TID) | ORAL | Status: DC | PRN

## 2012-12-22 MED ORDER — DEXTROSE 5 % IV SOLN
20.0000 mg/m2 | Freq: Once | INTRAVENOUS | Status: AC
  Administered 2012-12-22: 46 mg via INTRAVENOUS
  Filled 2012-12-22: qty 23

## 2012-12-22 MED ORDER — ONDANSETRON 8 MG/50ML IVPB (CHCC)
8.0000 mg | Freq: Once | INTRAVENOUS | Status: AC
  Administered 2012-12-22: 8 mg via INTRAVENOUS

## 2012-12-22 MED ORDER — SODIUM CHLORIDE 0.9 % IV SOLN
Freq: Once | INTRAVENOUS | Status: AC
  Administered 2012-12-22: 16:00:00 via INTRAVENOUS

## 2012-12-22 NOTE — Patient Instructions (Addendum)
[facility] Discharge Instructions for Patients Receiving Chemotherapy  Today you received the following chemotherapy agents Kyprolis and Zometa.  To help prevent nausea and vomiting after your treatment, we encourage you to take your nausea medication as prescribed.   If you develop nausea and vomiting that is not controlled by your nausea medication, call the clinic.   BELOW ARE SYMPTOMS THAT SHOULD BE REPORTED IMMEDIATELY:  *FEVER GREATER THAN 100.5 F  *CHILLS WITH OR WITHOUT FEVER  NAUSEA AND VOMITING THAT IS NOT CONTROLLED WITH YOUR NAUSEA MEDICATION  *UNUSUAL SHORTNESS OF BREATH  *UNUSUAL BRUISING OR BLEEDING  TENDERNESS IN MOUTH AND THROAT WITH OR WITHOUT PRESENCE OF ULCERS  *URINARY PROBLEMS  *BOWEL PROBLEMS  UNUSUAL RASH Items with * indicate a potential emergency and should be followed up as soon as possible.  Feel free to call the clinic you have any questions or concerns. The clinic phone number is [phone].

## 2012-12-22 NOTE — Progress Notes (Signed)
[facility] approved fentanyl from 12/22/12-12/17/12 pa# [id number].

## 2012-12-22 NOTE — Telephone Encounter (Signed)
gv and printed appt sched for pt. °

## 2012-12-22 NOTE — Progress Notes (Signed)
Hematology and Oncology Follow Up Visit  [name] [id number] [birth year] 62 y.o. 12/22/2012 4:11 PM      Principle Diagnosis:  This is a 62 year old female with the following issues:   1. 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  She had a free kappa light chain around 1500. 2. Diagnosis of deep vein thrombosis in July 2011.   Prior Therapy:    1. Initially treated with melphalan and prednisone.  She had a partial response with free light chains down as low as 19, M spike down to 0.52 g/dL.  2. Patient treated with Revlimid at 25 mg 3 weeks on and 1 week off between November 2009 until November 2010.  She had a partial response, light chains down to 33 and M spike was not detected.   3. She was treated on maintenance Revlimid 15 mg to maintain her partial response. 4. She was on Zometa intermittently, on hold know. 5. Currently anticoagulated with Coumadin 7.5 mg since July 2011. 6. She received Velcade 1.3 mg/sq m for a total of 2.9 mg weekly, which started on 02/21/2011 with weekly dexamethasone .  Treatment has been on hold due to recurrent hospitalization for wound infection. Treatment restarted in in 06/2011 till 09/2011. 7. Kyprolis started on 02/04/12. She completed 6 months of therapy on 07/08/12  Current therapy: Resumed Kyprolis on 11/25/12.   Interim History:  [name] presents today for routine followup prior to chemotherapy.  This is a pleasant 62 year old female with a few comorbid conditions as outlined above. She is reporting increased pain to her lower back and radiates to her bilateral hips, L>R. No neurological symptoms. She is able to ambulate and actually pain is relieved by stretching and ambulation. She takes Percocet 2 tabs every 4 hours which helps to some extent, but pain is not completely relieved. Her fentanyl patch was increased to 50 mcg due to increased pain several  weeks ago, but she has been unable to get this due to prior authorization issues. She has been out of her patches for about a week now.  Medications: I have reviewed the patient's current medications.  Allergies:  Allergies  Allergen Reactions  . Ibuprofen Other (See Comments)    Patient does not remember.   . Morphine And Related Other (See Comments)    Patient does not remember reaction, believes it caused raised marks on skin.    Past Medical History, Surgical history, Social history, and Family History were reviewed and updated.  Review of Systems: Constitutional:  Negative for fever, chills, night sweats, anorexia, weight loss, pain. Cardiovascular: no chest pain or dyspnea on exertion Respiratory: no cough, shortness of breath, or wheezing Neurological: no TIA or stroke symptoms Dermatological: negative ENT: negative Skin: Negative. Gastrointestinal: no abdominal pain, change in bowel habits, or black or bloody stools Genito-Urinary: no dysuria, trouble voiding, or hematuria Hematological and Lymphatic: negative Breast: negative for breast lumps Musculoskeletal: negative Remaining ROS negative.  Physical Exam: Blood pressure 135/62, pulse 91, temperature 98.3 F (36.8 C), temperature source Oral, resp. rate 18, height 5\' 5"  (1.651 m). ECOG: 1 General appearance: alert Head: Normocephalic, without obvious abnormality, atraumatic Neck: no adenopathy, no carotid bruit, no JVD, supple, symmetrical, trachea midline and thyroid not enlarged, symmetric, no tenderness/mass/nodules Lymph nodes: Cervical, supraclavicular, and axillary nodes normal. Heart:regular rate and rhythm, S1, S2 normal, no murmur, click, rub or gallop Lung:chest clear, no wheezing, rales, normal  symmetric air entry, Heart exam - S1, S2 normal, no murmur, no gallop, rate regular Abdomen: soft, non-tender, without masses or organomegaly. Wound appear have healed. EXT:no erythema, induration, or  nodules   Lab Results: Lab Results  Component Value Date   WBC 2.9* 12/22/2012   HGB 12.8 12/22/2012   HCT 38.3 12/22/2012   MCV 83.7 12/22/2012   PLT 122* 12/22/2012     Chemistry      Component Value Date/Time   NA 141 12/22/2012 1432   NA 137 02/11/2012 1445   K 4.0 12/22/2012 1432   K 5.2 02/11/2012 1445   CL 110* 12/02/2012 1444   CL 109 02/11/2012 1445   CO2 27 12/22/2012 1432   CO2 20 02/11/2012 1445   BUN 11.7 12/22/2012 1432   BUN 62* 02/11/2012 1445   CREATININE 1.1 12/22/2012 1432   CREATININE 2.40* 02/11/2012 1445      Component Value Date/Time   CALCIUM 9.5 12/22/2012 1432   CALCIUM 9.4 02/11/2012 1445   ALKPHOS 56 12/22/2012 1432   ALKPHOS 57 02/11/2012 1445   AST 20 12/22/2012 1432   AST 15 02/11/2012 1445   ALT 12 12/22/2012 1432   ALT 12 02/11/2012 1445   BILITOT 0.73 12/22/2012 1432   BILITOT 0.5 02/11/2012 1445      Impression and Plan:  This is a 62 year old female with the following issues:  1. Light chain multiple myeloma. Recently completed Kyprolis with improvement in light chains.Light chains were up to 1200 at her last visit. Recommend that she proceed with her kyprolis as scheduled.  2. Hypercalcemia. Zometa given on 5/30. Due to be given today. Calcium level has improved. 3. Pain in her hips/legs: Prior plain film x-rays show multiple myeloma. I have refilled her Percocet today. I have refilled 25 mcg patches while we are awaiting approval for her patches at 50 mcg. 4. Anemia, multifactorial. Hemoglobin has normalized. 5. History of deep vein thrombosis. Previously anticoagulated with Coumadin. No new clots noted. No more anticoagulation at this time.  6. Hypertension seems to be under relatively good control.  7. IV access: PAC in place.  8. Follow up. In 2 weeks as scheduled    [name] 7/1/20144:11 PM

## 2012-12-23 ENCOUNTER — Ambulatory Visit (HOSPITAL_BASED_OUTPATIENT_CLINIC_OR_DEPARTMENT_OTHER): Payer: Medicaid Other

## 2012-12-23 VITALS — BP 124/71 | HR 88 | Temp 97.3°F | Resp 18

## 2012-12-23 DIAGNOSIS — Z5112 Encounter for antineoplastic immunotherapy: Secondary | ICD-10-CM

## 2012-12-23 DIAGNOSIS — C9000 Multiple myeloma not having achieved remission: Secondary | ICD-10-CM

## 2012-12-23 DIAGNOSIS — C9002 Multiple myeloma in relapse: Secondary | ICD-10-CM

## 2012-12-23 MED ORDER — SODIUM CHLORIDE 0.9 % IV SOLN
Freq: Once | INTRAVENOUS | Status: AC
  Administered 2012-12-23: 15:00:00 via INTRAVENOUS

## 2012-12-23 MED ORDER — DEXAMETHASONE SODIUM PHOSPHATE 10 MG/ML IJ SOLN
10.0000 mg | Freq: Once | INTRAMUSCULAR | Status: AC
  Administered 2012-12-23: 10 mg via INTRAVENOUS

## 2012-12-23 MED ORDER — ONDANSETRON 8 MG/50ML IVPB (CHCC): 8.0000 mg | Freq: Once | INTRAVENOUS | Status: DC

## 2012-12-23 MED ORDER — SODIUM CHLORIDE 0.9 % IV SOLN: Freq: Once | INTRAVENOUS | Status: DC

## 2012-12-23 MED ORDER — HEPARIN SOD (PORK) LOCK FLUSH 100 UNIT/ML IV SOLN
500.0000 [IU] | Freq: Once | INTRAVENOUS | Status: AC | PRN
  Administered 2012-12-23: 500 [IU]
  Filled 2012-12-23: qty 5

## 2012-12-23 MED ORDER — SODIUM CHLORIDE 0.9 % IJ SOLN
10.0000 mL | INTRAMUSCULAR | Status: DC | PRN
  Administered 2012-12-23: 10 mL
  Filled 2012-12-23: qty 10

## 2012-12-23 MED ORDER — DEXTROSE 5 % IV SOLN
20.0000 mg/m2 | Freq: Once | INTRAVENOUS | Status: AC
  Administered 2012-12-23: 46 mg via INTRAVENOUS
  Filled 2012-12-23: qty 23

## 2012-12-23 NOTE — Patient Instructions (Addendum)
[facility] Discharge Instructions for Patients Receiving Chemotherapy  Today you received the following chemotherapy agents Kyprolis To help prevent nausea and vomiting after your treatment, we encourage you to take your nausea medication as prescribed.  If you develop nausea and vomiting that is not controlled by your nausea medication, call the clinic.   BELOW ARE SYMPTOMS THAT SHOULD BE REPORTED IMMEDIATELY:  *FEVER GREATER THAN 100.5 F  *CHILLS WITH OR WITHOUT FEVER  NAUSEA AND VOMITING THAT IS NOT CONTROLLED WITH YOUR NAUSEA MEDICATION  *UNUSUAL SHORTNESS OF BREATH  *UNUSUAL BRUISING OR BLEEDING  TENDERNESS IN MOUTH AND THROAT WITH OR WITHOUT PRESENCE OF ULCERS  *URINARY PROBLEMS  *BOWEL PROBLEMS  UNUSUAL RASH Items with * indicate a potential emergency and should be followed up as soon as possible.  Feel free to call the clinic you have any questions or concerns. The clinic phone number is [phone].    

## 2012-12-24 LAB — SPEP & IFE WITH QIG
Alpha-1-Globulin: 5.8 % — ABNORMAL HIGH (ref 2.9–4.9)
Alpha-2-Globulin: 12.1 % — ABNORMAL HIGH (ref 7.1–11.8)
Beta 2: 2.9 % — ABNORMAL LOW (ref 3.2–6.5)
Beta Globulin: 5.3 % (ref 4.7–7.2)
Gamma Globulin: 20.4 % — ABNORMAL HIGH (ref 11.1–18.8)

## 2012-12-24 LAB — KAPPA/LAMBDA LIGHT CHAINS: Kappa free light chain: 1020 mg/dL — ABNORMAL HIGH (ref 0.33–1.94)

## 2012-12-29 ENCOUNTER — Other Ambulatory Visit: Payer: Medicaid Other | Admitting: Lab

## 2012-12-29 ENCOUNTER — Ambulatory Visit (HOSPITAL_BASED_OUTPATIENT_CLINIC_OR_DEPARTMENT_OTHER): Payer: Medicaid Other

## 2012-12-29 ENCOUNTER — Other Ambulatory Visit (HOSPITAL_BASED_OUTPATIENT_CLINIC_OR_DEPARTMENT_OTHER): Payer: Medicaid Other | Admitting: Lab

## 2012-12-29 ENCOUNTER — Ambulatory Visit: Payer: Medicaid Other

## 2012-12-29 VITALS — BP 129/68 | HR 86 | Temp 98.2°F

## 2012-12-29 DIAGNOSIS — C9002 Multiple myeloma in relapse: Secondary | ICD-10-CM

## 2012-12-29 DIAGNOSIS — Z5112 Encounter for antineoplastic immunotherapy: Secondary | ICD-10-CM

## 2012-12-29 DIAGNOSIS — C9000 Multiple myeloma not having achieved remission: Secondary | ICD-10-CM

## 2012-12-29 LAB — CBC WITH DIFFERENTIAL/PLATELET
BASO%: 0.7 % (ref 0.0–2.0)
EOS%: 0.3 % (ref 0.0–7.0)
HCT: 37.7 % (ref 34.8–46.6)
MCH: 28 pg (ref 25.1–34.0)
MCHC: 32.9 g/dL (ref 31.5–36.0)
MCV: 85.1 fL (ref 79.5–101.0)
MONO%: 10.4 % (ref 0.0–14.0)
NEUT%: 46.5 % (ref 38.4–76.8)
RDW: 17.5 % — ABNORMAL HIGH (ref 11.2–14.5)
lymph#: 1.7 10*3/uL (ref 0.9–3.3)

## 2012-12-29 MED ORDER — SODIUM CHLORIDE 0.9 % IV SOLN
Freq: Once | INTRAVENOUS | Status: AC
  Administered 2012-12-29: 15:00:00 via INTRAVENOUS

## 2012-12-29 MED ORDER — SODIUM CHLORIDE 0.9 % IJ SOLN
10.0000 mL | INTRAMUSCULAR | Status: DC | PRN
  Administered 2012-12-29: 10 mL
  Filled 2012-12-29: qty 10

## 2012-12-29 MED ORDER — DEXTROSE 5 % IV SOLN
20.0000 mg/m2 | Freq: Once | INTRAVENOUS | Status: AC
  Administered 2012-12-29: 46 mg via INTRAVENOUS
  Filled 2012-12-29: qty 23

## 2012-12-29 MED ORDER — HEPARIN SOD (PORK) LOCK FLUSH 100 UNIT/ML IV SOLN
500.0000 [IU] | Freq: Once | INTRAVENOUS | Status: AC | PRN
  Administered 2012-12-29: 500 [IU]
  Filled 2012-12-29: qty 5

## 2012-12-29 MED ORDER — ONDANSETRON 8 MG/50ML IVPB (CHCC)
8.0000 mg | Freq: Once | INTRAVENOUS | Status: AC
  Administered 2012-12-29: 8 mg via INTRAVENOUS

## 2012-12-29 MED ORDER — DEXAMETHASONE SODIUM PHOSPHATE 10 MG/ML IJ SOLN
10.0000 mg | Freq: Once | INTRAMUSCULAR | Status: AC
  Administered 2012-12-29: 10 mg via INTRAVENOUS

## 2012-12-29 NOTE — Patient Instructions (Addendum)
[facility] Discharge Instructions for Patients Receiving Chemotherapy  Today you received the following chemotherapy agents kyprolis  To help prevent nausea and vomiting after your treatment, we encourage you to take your nausea medication as needed   If you develop nausea and vomiting that is not controlled by your nausea medication, call the clinic.   BELOW ARE SYMPTOMS THAT SHOULD BE REPORTED IMMEDIATELY:  *FEVER GREATER THAN 100.5 F  *CHILLS WITH OR WITHOUT FEVER  NAUSEA AND VOMITING THAT IS NOT CONTROLLED WITH YOUR NAUSEA MEDICATION  *UNUSUAL SHORTNESS OF BREATH  *UNUSUAL BRUISING OR BLEEDING  TENDERNESS IN MOUTH AND THROAT WITH OR WITHOUT PRESENCE OF ULCERS  *URINARY PROBLEMS  *BOWEL PROBLEMS  UNUSUAL RASH Items with * indicate a potential emergency and should be followed up as soon as possible.  Feel free to call the clinic you have any questions or concerns. The clinic phone number is [phone].    

## 2012-12-30 ENCOUNTER — Ambulatory Visit (HOSPITAL_BASED_OUTPATIENT_CLINIC_OR_DEPARTMENT_OTHER): Payer: Medicaid Other

## 2012-12-30 ENCOUNTER — Other Ambulatory Visit: Payer: Medicaid Other | Admitting: Lab

## 2012-12-30 VITALS — BP 104/55 | HR 71 | Temp 98.4°F | Resp 20

## 2012-12-30 DIAGNOSIS — C9002 Multiple myeloma in relapse: Secondary | ICD-10-CM

## 2012-12-30 DIAGNOSIS — C9000 Multiple myeloma not having achieved remission: Secondary | ICD-10-CM

## 2012-12-30 DIAGNOSIS — Z5112 Encounter for antineoplastic immunotherapy: Secondary | ICD-10-CM

## 2012-12-30 MED ORDER — SODIUM CHLORIDE 0.9 % IV SOLN
Freq: Once | INTRAVENOUS | Status: AC
  Administered 2012-12-30: 16:00:00 via INTRAVENOUS

## 2012-12-30 MED ORDER — DEXTROSE 5 % IV SOLN
20.0000 mg/m2 | Freq: Once | INTRAVENOUS | Status: AC
  Administered 2012-12-30: 46 mg via INTRAVENOUS
  Filled 2012-12-30: qty 23

## 2012-12-30 MED ORDER — SODIUM CHLORIDE 0.9 % IJ SOLN
10.0000 mL | INTRAMUSCULAR | Status: DC | PRN
Start: 2012-12-30 — End: 2012-12-30
  Administered 2012-12-30: 10 mL
  Filled 2012-12-30: qty 10

## 2012-12-30 MED ORDER — ONDANSETRON 8 MG/50ML IVPB (CHCC)
8.0000 mg | Freq: Once | INTRAVENOUS | Status: AC
  Administered 2012-12-30: 8 mg via INTRAVENOUS

## 2012-12-30 MED ORDER — HEPARIN SOD (PORK) LOCK FLUSH 100 UNIT/ML IV SOLN
500.0000 [IU] | Freq: Once | INTRAVENOUS | Status: AC | PRN
  Administered 2012-12-30: 500 [IU]
  Filled 2012-12-30: qty 5

## 2012-12-30 MED ORDER — DEXAMETHASONE SODIUM PHOSPHATE 10 MG/ML IJ SOLN
10.0000 mg | Freq: Once | INTRAMUSCULAR | Status: AC
  Administered 2012-12-30: 10 mg via INTRAVENOUS

## 2012-12-30 NOTE — Patient Instructions (Signed)
[facility] Discharge Instructions for Patients Receiving Chemotherapy  Today you received the following chemotherapy agents Kyprolis To help prevent nausea and vomiting after your treatment, we encourage you to take your nausea medication as prescribed.  If you develop nausea and vomiting that is not controlled by your nausea medication, call the clinic.   BELOW ARE SYMPTOMS THAT SHOULD BE REPORTED IMMEDIATELY:  *FEVER GREATER THAN 100.5 F  *CHILLS WITH OR WITHOUT FEVER  NAUSEA AND VOMITING THAT IS NOT CONTROLLED WITH YOUR NAUSEA MEDICATION  *UNUSUAL SHORTNESS OF BREATH  *UNUSUAL BRUISING OR BLEEDING  TENDERNESS IN MOUTH AND THROAT WITH OR WITHOUT PRESENCE OF ULCERS  *URINARY PROBLEMS  *BOWEL PROBLEMS  UNUSUAL RASH Items with * indicate a potential emergency and should be followed up as soon as possible.  Feel free to call the clinic you have any questions or concerns. The clinic phone number is [phone].    

## 2013-01-05 ENCOUNTER — Other Ambulatory Visit: Payer: Medicaid Other | Admitting: Lab

## 2013-01-05 ENCOUNTER — Ambulatory Visit (HOSPITAL_BASED_OUTPATIENT_CLINIC_OR_DEPARTMENT_OTHER): Payer: Medicaid Other

## 2013-01-05 ENCOUNTER — Telehealth: Payer: Self-pay | Admitting: Oncology

## 2013-01-05 ENCOUNTER — Ambulatory Visit (HOSPITAL_BASED_OUTPATIENT_CLINIC_OR_DEPARTMENT_OTHER): Payer: Medicaid Other | Admitting: Oncology

## 2013-01-05 ENCOUNTER — Other Ambulatory Visit (HOSPITAL_BASED_OUTPATIENT_CLINIC_OR_DEPARTMENT_OTHER): Payer: Medicaid Other | Admitting: Lab

## 2013-01-05 VITALS — BP 120/54 | HR 86 | Temp 97.2°F | Resp 18 | Ht 65.0 in

## 2013-01-05 DIAGNOSIS — C9000 Multiple myeloma not having achieved remission: Secondary | ICD-10-CM

## 2013-01-05 DIAGNOSIS — Z8551 Personal history of malignant neoplasm of bladder: Secondary | ICD-10-CM

## 2013-01-05 DIAGNOSIS — I10 Essential (primary) hypertension: Secondary | ICD-10-CM

## 2013-01-05 DIAGNOSIS — D649 Anemia, unspecified: Secondary | ICD-10-CM

## 2013-01-05 DIAGNOSIS — Z5112 Encounter for antineoplastic immunotherapy: Secondary | ICD-10-CM

## 2013-01-05 DIAGNOSIS — M25559 Pain in unspecified hip: Secondary | ICD-10-CM

## 2013-01-05 LAB — COMPREHENSIVE METABOLIC PANEL (CC13)
AST: 21 U/L (ref 5–34)
Alkaline Phosphatase: 56 U/L (ref 40–150)
Glucose: 84 mg/dl (ref 70–140)
Sodium: 142 mEq/L (ref 136–145)
Total Bilirubin: 1.06 mg/dL (ref 0.20–1.20)
Total Protein: 6.5 g/dL (ref 6.4–8.3)

## 2013-01-05 LAB — CBC WITH DIFFERENTIAL/PLATELET
Basophils Absolute: 0 10*3/uL (ref 0.0–0.1)
Eosinophils Absolute: 0 10*3/uL (ref 0.0–0.5)
HGB: 12 g/dL (ref 11.6–15.9)
MCV: 86.2 fL (ref 79.5–101.0)
MONO#: 0.5 10*3/uL (ref 0.1–0.9)
MONO%: 12.3 % (ref 0.0–14.0)
NEUT#: 1.8 10*3/uL (ref 1.5–6.5)
RDW: 18.5 % — ABNORMAL HIGH (ref 11.2–14.5)
lymph#: 1.6 10*3/uL (ref 0.9–3.3)

## 2013-01-05 MED ORDER — ONDANSETRON 8 MG/50ML IVPB (CHCC)
8.0000 mg | Freq: Once | INTRAVENOUS | Status: AC
  Administered 2013-01-05: 8 mg via INTRAVENOUS

## 2013-01-05 MED ORDER — SODIUM CHLORIDE 0.9 % IV SOLN: Freq: Once | INTRAVENOUS | Status: DC

## 2013-01-05 MED ORDER — DEXAMETHASONE SODIUM PHOSPHATE 10 MG/ML IJ SOLN
10.0000 mg | Freq: Once | INTRAMUSCULAR | Status: AC
  Administered 2013-01-05: 10 mg via INTRAVENOUS

## 2013-01-05 MED ORDER — DIAZEPAM 5 MG PO TABS: 5.0000 mg | ORAL_TABLET | Freq: Three times a day (TID) | ORAL | Status: AC | PRN

## 2013-01-05 MED ORDER — SODIUM CHLORIDE 0.9 % IJ SOLN
10.0000 mL | INTRAMUSCULAR | Status: DC | PRN
  Administered 2013-01-05: 10 mL
  Filled 2013-01-05: qty 10

## 2013-01-05 MED ORDER — SODIUM CHLORIDE 0.9 % IV SOLN
Freq: Once | INTRAVENOUS | Status: AC
  Administered 2013-01-05: 16:00:00 via INTRAVENOUS

## 2013-01-05 MED ORDER — HEPARIN SOD (PORK) LOCK FLUSH 100 UNIT/ML IV SOLN
500.0000 [IU] | Freq: Once | INTRAVENOUS | Status: AC | PRN
  Administered 2013-01-05: 500 [IU]
  Filled 2013-01-05: qty 5

## 2013-01-05 MED ORDER — DEXTROSE 5 % IV SOLN
20.0000 mg/m2 | Freq: Once | INTRAVENOUS | Status: AC
  Administered 2013-01-05: 46 mg via INTRAVENOUS
  Filled 2013-01-05: qty 23

## 2013-01-05 NOTE — Telephone Encounter (Signed)
appts complete. gv pt appt schedule for July thru August.

## 2013-01-05 NOTE — Patient Instructions (Addendum)
[facility] Discharge Instructions for Patients Receiving Chemotherapy  Today you received the following chemotherapy agents Kyprolis.  To help prevent nausea and vomiting after your treatment, we encourage you to take your nausea medication.   If you develop nausea and vomiting that is not controlled by your nausea medication, call the clinic.   BELOW ARE SYMPTOMS THAT SHOULD BE REPORTED IMMEDIATELY:  *FEVER GREATER THAN 100.5 F  *CHILLS WITH OR WITHOUT FEVER  NAUSEA AND VOMITING THAT IS NOT CONTROLLED WITH YOUR NAUSEA MEDICATION  *UNUSUAL SHORTNESS OF BREATH  *UNUSUAL BRUISING OR BLEEDING  TENDERNESS IN MOUTH AND THROAT WITH OR WITHOUT PRESENCE OF ULCERS  *URINARY PROBLEMS  *BOWEL PROBLEMS  UNUSUAL RASH Items with * indicate a potential emergency and should be followed up as soon as possible.  Feel free to call the clinic you have any questions or concerns. The clinic phone number is [phone].    

## 2013-01-05 NOTE — Progress Notes (Signed)
Hematology and Oncology Follow Up Visit  [name] [id number] 03/25/1951 62 y.o. 01/05/2013 3:01 PM      Principle Diagnosis:  This is a 62 year old female with the following issues:   1. 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  She had a free kappa light chain around 1500. 2. Diagnosis of deep vein thrombosis in July 2011.   Prior Therapy:    1. Initially treated with melphalan and prednisone.  She had a partial response with free light chains down as low as 19, M spike down to 0.52 g/dL.  2. Patient treated with Revlimid at 25 mg 3 weeks on and 1 week off between November 2009 until November 2010.  She had a partial response, light chains down to 33 and M spike was not detected.   3. She was treated on maintenance Revlimid 15 mg to maintain her partial response. 4. She was on Zometa intermittently, on hold know. 5. Currently anticoagulated with Coumadin 7.5 mg since July 2011. 6. She received Velcade 1.3 mg/sq m for a total of 2.9 mg weekly, which started on 02/21/2011 with weekly dexamethasone .  Treatment has been on hold due to recurrent hospitalization for wound infection. Treatment restarted in in 06/2011 till 09/2011. 7. Kyprolis started on 02/04/12. She completed 6 months of therapy on 07/08/12  Current therapy: Resumed Kyprolis on 11/25/12. She is here for cycle 8 day 15  Interim History:  Ms. Roedl presents today for routine followup prior to chemotherapy.  This is a pleasant 62 year old female with a few comorbid conditions as outlined above. She is reporting pain to her lower back and radiates to her bilateral hips, L>R. No neurological symptoms. This has improved since the last visit. She is able to ambulate and actually pain is relieved by stretching and ambulation. She takes Percocet 2 tabs every 4 hours which helps. Her fentanyl patch is at 50 mcg and have helped with her pain at  this time. She is stil ambulating without problems. She is tolerating chemotherapy well.   Medications: I have reviewed the patient's current medications.  Allergies:  Allergies  Allergen Reactions  . Ibuprofen Other (See Comments)    Patient does not remember.   . Morphine And Related Other (See Comments)    Patient does not remember reaction, believes it caused raised marks on skin.    Past Medical History, Surgical history, Social history, and Family History were reviewed and updated.  Review of Systems: Constitutional:  Negative for fever, chills, night sweats, anorexia, weight loss, pain. Cardiovascular: no chest pain or dyspnea on exertion Respiratory: no cough, shortness of breath, or wheezing Neurological: no TIA or stroke symptoms Dermatological: negative ENT: negative Skin: Negative. Gastrointestinal: no abdominal pain, change in bowel habits, or black or bloody stools Genito-Urinary: no dysuria, trouble voiding, or hematuria Hematological and Lymphatic: negative Breast: negative for breast lumps Musculoskeletal: negative Remaining ROS negative.  Physical Exam: Blood pressure 120/54, pulse 86, temperature 97.2 F (36.2 C), temperature source Oral, resp. rate 18, height 5\' 5"  (1.651 m). ECOG: 1 General appearance: alert Head: Normocephalic, without obvious abnormality, atraumatic Neck: no adenopathy, no carotid bruit, no JVD, supple, symmetrical, trachea midline and thyroid not enlarged, symmetric, no tenderness/mass/nodules Lymph nodes: Cervical, supraclavicular, and axillary nodes normal. Heart:regular rate and rhythm, S1, S2 normal, no murmur, click, rub or gallop Lung:chest clear, no wheezing, rales, normal symmetric air entry, Heart exam - S1, S2  normal, no murmur, no gallop, rate regular Abdomen: soft, non-tender, without masses or organomegaly. Wound appear have healed. EXT:no erythema, induration, or nodules   Lab Results: Lab Results  Component Value  Date   WBC 3.9 01/05/2013   HGB 12.0 01/05/2013   HCT 37.1 01/05/2013   MCV 86.2 01/05/2013   PLT 119* 01/05/2013     Chemistry      Component Value Date/Time   NA 141 12/22/2012 1432   NA 137 02/11/2012 1445   K 4.0 12/22/2012 1432   K 5.2 02/11/2012 1445   CL 110* 12/02/2012 1444   CL 109 02/11/2012 1445   CO2 27 12/22/2012 1432   CO2 20 02/11/2012 1445   BUN 11.7 12/22/2012 1432   BUN 62* 02/11/2012 1445   CREATININE 1.1 12/22/2012 1432   CREATININE 2.40* 02/11/2012 1445      Component Value Date/Time   CALCIUM 9.5 12/22/2012 1432   CALCIUM 9.4 02/11/2012 1445   ALKPHOS 56 12/22/2012 1432   ALKPHOS 57 02/11/2012 1445   AST 20 12/22/2012 1432   AST 15 02/11/2012 1445   ALT 12 12/22/2012 1432   ALT 12 02/11/2012 1445   BILITOT 0.73 12/22/2012 1432   BILITOT 0.5 02/11/2012 1445     Results for [name], [name] (MRN [id number]) as of 01/05/2013 15:05  Ref. Range 11/20/2012 13:55 12/22/2012 14:32  Kappa free light chain Latest Range: 0.33-1.94 mg/dL 4098.11 (H) 9147.82 (H)   Impression and Plan:  This is a 62 year old female with the following issues:  1. Light chain multiple myeloma. Recently completed Kyprolis with improvement in light chains. Kappa light chains were up to 1200 but dropped down to 1020 with treatment. She is clinically doing better on therapy. Recommend that she proceed with her kyprolis as scheduled.  2. Hypercalcemia. Zometa given on 7/1. And will be repeated in one month.  3. Pain in her hips/legs: better with Fentanyl and prn percocet.  4. Anemia, multifactorial. Hemoglobin has normalized. 5. History of deep vein thrombosis. Previously anticoagulated with Coumadin. No new clots noted. No more anticoagulation at this time.  6. Hypertension seems to be under relatively good control.  7. IV access: PAC in place.  8. Follow up. In 2 weeks as scheduled    [facility] 7/15/20143:01 PM

## 2013-01-06 ENCOUNTER — Ambulatory Visit (HOSPITAL_BASED_OUTPATIENT_CLINIC_OR_DEPARTMENT_OTHER): Payer: Medicaid Other

## 2013-01-06 VITALS — BP 102/56 | HR 62 | Temp 97.8°F | Resp 18

## 2013-01-06 DIAGNOSIS — C9000 Multiple myeloma not having achieved remission: Secondary | ICD-10-CM

## 2013-01-06 DIAGNOSIS — C9002 Multiple myeloma in relapse: Secondary | ICD-10-CM

## 2013-01-06 DIAGNOSIS — Z5112 Encounter for antineoplastic immunotherapy: Secondary | ICD-10-CM

## 2013-01-06 MED ORDER — ONDANSETRON 8 MG/50ML IVPB (CHCC)
8.0000 mg | Freq: Once | INTRAVENOUS | Status: AC
  Administered 2013-01-06: 8 mg via INTRAVENOUS

## 2013-01-06 MED ORDER — SODIUM CHLORIDE 0.9 % IV SOLN
Freq: Once | INTRAVENOUS | Status: AC
  Administered 2013-01-06: 15:00:00 via INTRAVENOUS

## 2013-01-06 MED ORDER — SODIUM CHLORIDE 0.9 % IV SOLN: Freq: Once | INTRAVENOUS | Status: DC

## 2013-01-06 MED ORDER — HEPARIN SOD (PORK) LOCK FLUSH 100 UNIT/ML IV SOLN
500.0000 [IU] | Freq: Once | INTRAVENOUS | Status: AC | PRN
  Administered 2013-01-06: 500 [IU]
  Filled 2013-01-06: qty 5

## 2013-01-06 MED ORDER — DEXTROSE 5 % IV SOLN
20.0000 mg/m2 | Freq: Once | INTRAVENOUS | Status: AC
  Administered 2013-01-06: 46 mg via INTRAVENOUS
  Filled 2013-01-06: qty 23

## 2013-01-06 MED ORDER — SODIUM CHLORIDE 0.9 % IJ SOLN
10.0000 mL | INTRAMUSCULAR | Status: DC | PRN
  Administered 2013-01-06: 10 mL
  Filled 2013-01-06: qty 10

## 2013-01-06 MED ORDER — DEXAMETHASONE SODIUM PHOSPHATE 10 MG/ML IJ SOLN
10.0000 mg | Freq: Once | INTRAMUSCULAR | Status: AC
  Administered 2013-01-06: 10 mg via INTRAVENOUS

## 2013-01-06 NOTE — Patient Instructions (Signed)
[facility] Discharge Instructions for Patients Receiving Chemotherapy  Today you received the following chemotherapy agents Kyprolis.  To help prevent nausea and vomiting after your treatment, we encourage you to take your nausea medication.   If you develop nausea and vomiting that is not controlled by your nausea medication, call the clinic.   BELOW ARE SYMPTOMS THAT SHOULD BE REPORTED IMMEDIATELY:  *FEVER GREATER THAN 100.5 F  *CHILLS WITH OR WITHOUT FEVER  NAUSEA AND VOMITING THAT IS NOT CONTROLLED WITH YOUR NAUSEA MEDICATION  *UNUSUAL SHORTNESS OF BREATH  *UNUSUAL BRUISING OR BLEEDING  TENDERNESS IN MOUTH AND THROAT WITH OR WITHOUT PRESENCE OF ULCERS  *URINARY PROBLEMS  *BOWEL PROBLEMS  UNUSUAL RASH Items with * indicate a potential emergency and should be followed up as soon as possible.  Feel free to call the clinic you have any questions or concerns. The clinic phone number is [phone].    

## 2013-01-16 ENCOUNTER — Encounter (HOSPITAL_COMMUNITY): Payer: Self-pay

## 2013-01-16 ENCOUNTER — Emergency Department (HOSPITAL_COMMUNITY)
Admission: EM | Admit: 2013-01-16 | Discharge: 2013-01-16 | Disposition: A | Discharge status: Home / Self Care | Payer: Medicaid Other | Attending: Emergency Medicine | Admitting: Emergency Medicine

## 2013-01-16 DIAGNOSIS — F411 Generalized anxiety disorder: Secondary | ICD-10-CM | POA: Insufficient documentation

## 2013-01-16 DIAGNOSIS — R609 Edema, unspecified: Secondary | ICD-10-CM | POA: Insufficient documentation

## 2013-01-16 DIAGNOSIS — Z872 Personal history of diseases of the skin and subcutaneous tissue: Secondary | ICD-10-CM | POA: Insufficient documentation

## 2013-01-16 DIAGNOSIS — E119 Type 2 diabetes mellitus without complications: Secondary | ICD-10-CM | POA: Insufficient documentation

## 2013-01-16 DIAGNOSIS — C9000 Multiple myeloma not having achieved remission: Secondary | ICD-10-CM | POA: Insufficient documentation

## 2013-01-16 DIAGNOSIS — E669 Obesity, unspecified: Secondary | ICD-10-CM | POA: Insufficient documentation

## 2013-01-16 DIAGNOSIS — N189 Chronic kidney disease, unspecified: Secondary | ICD-10-CM | POA: Insufficient documentation

## 2013-01-16 DIAGNOSIS — Z87448 Personal history of other diseases of urinary system: Secondary | ICD-10-CM | POA: Insufficient documentation

## 2013-01-16 DIAGNOSIS — Z8719 Personal history of other diseases of the digestive system: Secondary | ICD-10-CM | POA: Insufficient documentation

## 2013-01-16 DIAGNOSIS — G8929 Other chronic pain: Secondary | ICD-10-CM | POA: Insufficient documentation

## 2013-01-16 DIAGNOSIS — K219 Gastro-esophageal reflux disease without esophagitis: Secondary | ICD-10-CM | POA: Insufficient documentation

## 2013-01-16 DIAGNOSIS — F3289 Other specified depressive episodes: Secondary | ICD-10-CM | POA: Insufficient documentation

## 2013-01-16 DIAGNOSIS — Z9889 Other specified postprocedural states: Secondary | ICD-10-CM | POA: Insufficient documentation

## 2013-01-16 DIAGNOSIS — Z87891 Personal history of nicotine dependence: Secondary | ICD-10-CM | POA: Insufficient documentation

## 2013-01-16 DIAGNOSIS — M79609 Pain in unspecified limb: Secondary | ICD-10-CM | POA: Insufficient documentation

## 2013-01-16 DIAGNOSIS — Z79899 Other long term (current) drug therapy: Secondary | ICD-10-CM | POA: Insufficient documentation

## 2013-01-16 DIAGNOSIS — Z4002 Encounter for prophylactic removal of ovary: Secondary | ICD-10-CM | POA: Insufficient documentation

## 2013-01-16 DIAGNOSIS — I129 Hypertensive chronic kidney disease with stage 1 through stage 4 chronic kidney disease, or unspecified chronic kidney disease: Secondary | ICD-10-CM | POA: Insufficient documentation

## 2013-01-16 DIAGNOSIS — I509 Heart failure, unspecified: Secondary | ICD-10-CM | POA: Insufficient documentation

## 2013-01-16 DIAGNOSIS — E785 Hyperlipidemia, unspecified: Secondary | ICD-10-CM | POA: Insufficient documentation

## 2013-01-16 DIAGNOSIS — F329 Major depressive disorder, single episode, unspecified: Secondary | ICD-10-CM | POA: Insufficient documentation

## 2013-01-16 DIAGNOSIS — M2550 Pain in unspecified joint: Secondary | ICD-10-CM | POA: Insufficient documentation

## 2013-01-16 LAB — CBC
MCH: 28.3 pg (ref 26.0–34.0)
MCHC: 33.1 g/dL (ref 30.0–36.0)
MCV: 85.6 fL (ref 78.0–100.0)
Platelets: 124 10*3/uL — ABNORMAL LOW (ref 150–400)
RBC: 3.81 MIL/uL — ABNORMAL LOW (ref 3.87–5.11)
RDW: 20.6 % — ABNORMAL HIGH (ref 11.5–15.5)

## 2013-01-16 LAB — BASIC METABOLIC PANEL
BUN: 9 mg/dL (ref 6–23)
Calcium: 9.6 mg/dL (ref 8.4–10.5)
Creatinine, Ser: 0.94 mg/dL (ref 0.50–1.10)
GFR calc Af Amer: 74 mL/min — ABNORMAL LOW (ref >90)
GFR calc non Af Amer: 64 mL/min — ABNORMAL LOW (ref >90)

## 2013-01-16 LAB — CK: Total CK: 114 U/L (ref 7–177)

## 2013-01-16 MED ORDER — HYDROMORPHONE HCL PF 1 MG/ML IJ SOLN
0.5000 mg | Freq: Once | INTRAMUSCULAR | Status: AC
  Administered 2013-01-16: 0.5 mg via INTRAVENOUS
  Filled 2013-01-16: qty 1

## 2013-01-16 MED ORDER — HYDROMORPHONE HCL PF 1 MG/ML IJ SOLN
1.0000 mg | Freq: Once | INTRAMUSCULAR | Status: AC
  Administered 2013-01-16: 1 mg via INTRAMUSCULAR
  Filled 2013-01-16: qty 1

## 2013-01-16 MED ORDER — HEPARIN SOD (PORK) LOCK FLUSH 100 UNIT/ML IV SOLN
INTRAVENOUS | Status: AC
  Administered 2013-01-16: 500 [IU]
  Filled 2013-01-16: qty 5

## 2013-01-16 MED ORDER — HEPARIN SOD (PORK) LOCK FLUSH 100 UNIT/ML IV SOLN
INTRAVENOUS | Status: AC
  Filled 2013-01-16: qty 5

## 2013-01-16 NOTE — ED Provider Notes (Signed)
CSN: [id number]     Arrival date & time 01/16/13  1430 History     First MD Initiated Contact with Patient 01/16/13 1521     Chief Complaint  Patient presents with  . Leg Pain   (Consider location/radiation/quality/duration/timing/severity/associated sxs/prior Treatment) HPI  [name] is a 62 y.o. female  with a hx of multiple myeloma (on Kyprolis), SBO, DM, HTN, chronic renal insufficiency, obesity presents to the Emergency Department complaining of gradual, persistent, progressively worsening nontraumatic, bilateral anterior proximal leg pain worsened when she has been in bed and attempts to stand from sitting position.  Pt states she had chemo on January 05, 2013.  Standing and stretching makes the pain better as well as mild relief with fentanyl patch and percocet. Fever, chills, headache, neck pain, chest pain, shortness of breath, abdominal pain, nausea, vomiting, diarrhea, weakness, dizziness, syncope, rash, erythema of the legs, weakness in her legs.  Record review shows pt with chronic pain in her hips/legs treated and improved with Fentanyl 50 mcg patch and prn percocet.  She has a hx of deep vein thrombosis; previously anticoagulated with Coumadin, but no more anticoagulation at this time.   Past Medical History  Diagnosis Date  . Small bowel obstruction   . Diabetes mellitus   . Hypertension   . Hyperlipidemia   . Chronic renal insufficiency   . Multiple myeloma   . Abdominal pain   . Ventral hernia   . Congestive heart failure   . GERD (gastroesophageal reflux disease)   . Renal insufficiency   . Obesity   . Depression   . Anxiety   . Abscess     groin   Past Surgical History  Procedure Laterality Date  . Hernia repair      ventral hernia  . Exploratory laparotomy    . Oophorectomy    . Breast surgery      reduction  . Panniculectomy    . Incise and drain abcess      groin   Family History  Problem Relation Age of Onset  . Heart disease Mother     History  Substance Use Topics  . Smoking status: Former Games developer  . Smokeless tobacco: Never Used  . Alcohol Use: No   OB History   Grav Para Term Preterm Abortions TAB SAB Ect Mult Living                 Review of Systems  Constitutional: Negative for fever, diaphoresis, appetite change, fatigue and unexpected weight change.  HENT: Negative for mouth sores and neck stiffness.   Eyes: Negative for visual disturbance.  Respiratory: Negative for cough, chest tightness, shortness of breath and wheezing.   Cardiovascular: Positive for leg swelling (chronic, unchanged). Negative for chest pain.  Gastrointestinal: Negative for nausea, vomiting, abdominal pain, diarrhea and constipation.  Endocrine: Negative for polydipsia, polyphagia and polyuria.  Genitourinary: Negative for dysuria, urgency, frequency and hematuria.  Musculoskeletal: Positive for myalgias and arthralgias. Negative for back pain.  Skin: Negative for rash.  Allergic/Immunologic: Negative for immunocompromised state.  Neurological: Negative for syncope, light-headedness and headaches.  Hematological: Does not bruise/bleed easily.  Psychiatric/Behavioral: Negative for sleep disturbance. The patient is not nervous/anxious.     Allergies  Ibuprofen and Morphine and related  Home Medications   Current Outpatient Rx  Name  Route  Sig  Dispense  Refill  . amLODipine (NORVASC) 5 MG tablet   Oral   Take 1 tablet (5 mg total) by mouth daily.  30 tablet   11     Refill request was faxed.   Marland Kitchen atenolol (TENORMIN) 25 MG tablet   Oral   Take 12.5 mg by mouth daily.         Marland Kitchen buPROPion (WELLBUTRIN XL) 150 MG 24 hr tablet   Oral   Take 1 tablet (150 mg total) by mouth daily.   30 tablet   11   . diazepam (VALIUM) 5 MG tablet   Oral   Take 1 tablet (5 mg total) by mouth every 8 (eight) hours as needed for anxiety.   65 tablet   0   . fentaNYL (DURAGESIC - DOSED MCG/HR) 50 MCG/HR   Transdermal   Place 1 patch  (50 mcg total) onto the skin every 3 (three) days.   10 patch   0   . furosemide (LASIX) 40 MG tablet   Oral   Take 1 tablet (40 mg total) by mouth daily.   30 tablet   11   . lidocaine-prilocaine (EMLA) cream   Topical   Apply topically as needed.   30 g   1   . loratadine (CLARITIN) 10 MG tablet   Oral   Take 10 mg by mouth daily.           . Multiple Vitamin (MULTIVITAMIN) tablet   Oral   Take 1 tablet by mouth daily.           . ondansetron (ZOFRAN) 8 MG tablet   Oral   Take 1 tablet (8 mg total) by mouth every 8 (eight) hours as needed.   30 tablet   1   . oxybutynin (DITROPAN) 5 MG tablet   Oral   Take 1 tablet (5 mg total) by mouth daily.   30 tablet   11   . oxyCODONE-acetaminophen (PERCOCET/ROXICET) 5-325 MG per tablet   Oral   Take 1-2 tablets by mouth every 4 (four) hours as needed for pain.   90 tablet   0   . potassium chloride (KLOR-CON) 20 MEQ packet   Oral   Take 20 mEq by mouth daily.           . pravastatin (PRAVACHOL) 40 MG tablet   Oral   Take 1 tablet (40 mg total) by mouth daily.   30 tablet   11   . PRESCRIPTION MEDICATION      PT GETS CHEMO ONCE WEEKLY ON Tuesday.  Followed by Dr Clelia Croft         . prochlorperazine (COMPAZINE) 10 MG tablet   Oral   Take 1 tablet (10 mg total) by mouth every 6 (six) hours as needed.   30 tablet   1   . ranitidine (ZANTAC) 150 MG tablet   Oral   Take 1 tablet (150 mg total) by mouth 2 (two) times daily.   14 tablet   0   . temazepam (RESTORIL) 15 MG capsule   Oral   Take 1 capsule (15 mg total) by mouth at bedtime as needed.   20 capsule   1    BP 122/66  Pulse 106  Temp(Src) 99.3 F (37.4 C) (Oral)  Resp 18  SpO2 100% Physical Exam  Nursing note and vitals reviewed. Constitutional: She is oriented to person, place, and time. She appears well-developed and well-nourished. No distress.  HENT:  Head: Normocephalic and atraumatic.  Mouth/Throat: Oropharynx is clear and  moist. No oropharyngeal exudate.  Eyes: Conjunctivae and EOM are normal. Pupils are equal, round,  and reactive to light.  Neck: Normal range of motion. Neck supple.  Full ROM without pain  Cardiovascular: Normal rate, regular rhythm, normal heart sounds and intact distal pulses.   No murmur heard. Pulmonary/Chest: Effort normal and breath sounds normal. No respiratory distress. She has no wheezes. She has no rales.  Abdominal: Soft. Bowel sounds are normal. She exhibits no distension. There is no tenderness.  Well-healed lower midline incision  Musculoskeletal: Normal range of motion. She exhibits edema (mild, chronic, 1+ nonpitting edema of the bilateral lower extremities). She exhibits no tenderness (no tenderness to palpation of the bilateral lower extremities).  Full range of motion of the T-spine and L-spine No tenderness to palpation of the spinous processes of the T-spine or L-spine No tenderness to palpation of the paraspinous muscles of the L-spine  Lymphadenopathy:    She has no cervical adenopathy.  Neurological: She is alert and oriented to person, place, and time. She has normal reflexes. She exhibits normal muscle tone. Coordination normal.  Speech is clear and goal oriented, follows commands Normal strength in upper and lower extremities bilaterally including dorsiflexion and plantar flexion, strong and equal grip strength Sensation normal to light and sharp touch Moves extremities without ataxia, coordination intact Pain with movement from sitting to standing, but ambulates without difficulty with normal gait Normal balance   Skin: Skin is warm and dry. No rash noted. She is not diaphoretic. No erythema.  No erythema or evidence of cellulitis of the bilateral extremities  Psychiatric: She has a normal mood and affect.    ED Course   Procedures (including critical care time)  Labs Reviewed  CBC - Abnormal; Notable for the following:    RBC 3.81 (*)    Hemoglobin 10.8  (*)    HCT 32.6 (*)    RDW 20.6 (*)    Platelets 124 (*)    All other components within normal limits  BASIC METABOLIC PANEL - Abnormal; Notable for the following:    Glucose, Bld 109 (*)    GFR calc non Af Amer 64 (*)    GFR calc Af Amer 74 (*)    All other components within normal limits  CK   No results found. 1. Extremity pain   2. Multiple myeloma   3. Arthralgia     MDM  Mickie Hillier presents with worsening extremity pain.  History of multiple myeloma on chemotherapy.  Common side effects of Kyprolis include back pain and extremity pain.  Will obtain basic labs to ensure no evidence of breath no lysis or joint abnormalities.  Patient pain being treated here in the department.  6:54 PM Pt with pain controlled in department.  Pt remains neurologically intact without evidence of cauda equina.  No evidence of rhabdomyolysis.  Mild anemia below baseline noted on CBC at 10.8.  Recommend re-evaluation by heme/onc.  Pain likely as a side effect of the Kyprolis.  Pt is to continue using fentanyl patches and percocet PRN and discuss further pain management with Dr Clelia Croft.  I have also discussed reasons to return immediately to the ER.  Patient expresses understanding and agrees with plan.  Dr. Cathren Laine was consulted and agrees with the plan.      Dierdre Forth, PA-C 01/16/13 1857

## 2013-01-16 NOTE — ED Notes (Signed)
Pt states she would like to speak with PA before being discharged. PA notified.

## 2013-01-16 NOTE — ED Notes (Signed)
She c/o non-traumatic bilet. Upper, inner thigh area pain x 1 week.  She is in no distress.

## 2013-01-19 NOTE — ED Provider Notes (Signed)
Medical screening examination/treatment/procedure(s) were performed by non-physician practitioner and as supervising physician I was immediately available for consultation/collaboration.   Chioma Mukherjee E Mckinley Adelstein, MD 01/19/13 0832 

## 2013-01-20 ENCOUNTER — Ambulatory Visit: Payer: Medicaid Other | Admitting: Physician Assistant

## 2013-01-20 ENCOUNTER — Encounter: Payer: Self-pay | Admitting: Oncology

## 2013-01-20 ENCOUNTER — Ambulatory Visit (HOSPITAL_BASED_OUTPATIENT_CLINIC_OR_DEPARTMENT_OTHER): Payer: Medicaid Other | Admitting: Oncology

## 2013-01-20 ENCOUNTER — Other Ambulatory Visit: Payer: Medicaid Other | Admitting: Lab

## 2013-01-20 ENCOUNTER — Ambulatory Visit (HOSPITAL_BASED_OUTPATIENT_CLINIC_OR_DEPARTMENT_OTHER): Payer: Medicaid Other

## 2013-01-20 ENCOUNTER — Other Ambulatory Visit (HOSPITAL_BASED_OUTPATIENT_CLINIC_OR_DEPARTMENT_OTHER): Payer: Medicaid Other | Admitting: Lab

## 2013-01-20 VITALS — BP 133/54 | HR 89 | Temp 98.5°F | Resp 18

## 2013-01-20 DIAGNOSIS — D649 Anemia, unspecified: Secondary | ICD-10-CM

## 2013-01-20 DIAGNOSIS — Z86718 Personal history of other venous thrombosis and embolism: Secondary | ICD-10-CM

## 2013-01-20 DIAGNOSIS — K436 Other and unspecified ventral hernia with obstruction, without gangrene: Secondary | ICD-10-CM

## 2013-01-20 DIAGNOSIS — C9000 Multiple myeloma not having achieved remission: Secondary | ICD-10-CM

## 2013-01-20 DIAGNOSIS — Z5112 Encounter for antineoplastic immunotherapy: Secondary | ICD-10-CM

## 2013-01-20 DIAGNOSIS — R609 Edema, unspecified: Secondary | ICD-10-CM

## 2013-01-20 DIAGNOSIS — M79609 Pain in unspecified limb: Secondary | ICD-10-CM

## 2013-01-20 DIAGNOSIS — L02211 Cutaneous abscess of abdominal wall: Secondary | ICD-10-CM

## 2013-01-20 DIAGNOSIS — K430 Incisional hernia with obstruction, without gangrene: Secondary | ICD-10-CM

## 2013-01-20 LAB — CBC WITH DIFFERENTIAL/PLATELET
BASO%: 1 % (ref 0.0–2.0)
EOS%: 0.2 % (ref 0.0–7.0)
HCT: 33.8 % — ABNORMAL LOW (ref 34.8–46.6)
LYMPH%: 35.7 % (ref 14.0–49.7)
MCH: 29.1 pg (ref 25.1–34.0)
MCHC: 32.8 g/dL (ref 31.5–36.0)
MONO#: 0.4 10*3/uL (ref 0.1–0.9)
NEUT%: 52.2 % (ref 38.4–76.8)
RBC: 3.82 10*6/uL (ref 3.70–5.45)
WBC: 3.4 10*3/uL — ABNORMAL LOW (ref 3.9–10.3)
lymph#: 1.2 10*3/uL (ref 0.9–3.3)

## 2013-01-20 LAB — COMPREHENSIVE METABOLIC PANEL (CC13)
ALT: 13 U/L (ref 0–55)
AST: 24 U/L (ref 5–34)
CO2: 24 mEq/L (ref 22–29)
Chloride: 111 mEq/L — ABNORMAL HIGH (ref 98–109)
Creatinine: 1 mg/dL (ref 0.6–1.1)
Sodium: 143 mEq/L (ref 136–145)
Total Bilirubin: 0.89 mg/dL (ref 0.20–1.20)
Total Protein: 6.6 g/dL (ref 6.4–8.3)

## 2013-01-20 MED ORDER — ONDANSETRON 8 MG/50ML IVPB (CHCC)
8.0000 mg | Freq: Once | INTRAVENOUS | Status: AC
  Administered 2013-01-20: 8 mg via INTRAVENOUS

## 2013-01-20 MED ORDER — OXYCODONE HCL 5 MG PO TABS: 5.0000 mg | ORAL_TABLET | ORAL | Status: DC | PRN

## 2013-01-20 MED ORDER — DEXTROSE 5 % IV SOLN
20.0000 mg/m2 | Freq: Once | INTRAVENOUS | Status: AC
  Administered 2013-01-20: 46 mg via INTRAVENOUS
  Filled 2013-01-20: qty 23

## 2013-01-20 MED ORDER — HEPARIN SOD (PORK) LOCK FLUSH 100 UNIT/ML IV SOLN
500.0000 [IU] | Freq: Once | INTRAVENOUS | Status: AC | PRN
  Administered 2013-01-20: 500 [IU]
  Filled 2013-01-20: qty 5

## 2013-01-20 MED ORDER — POTASSIUM CHLORIDE 20 MEQ PO PACK: 20.0000 meq | PACK | Freq: Every day | ORAL | Status: DC

## 2013-01-20 MED ORDER — ZOLEDRONIC ACID 4 MG/100ML IV SOLN
4.0000 mg | Freq: Once | INTRAVENOUS | Status: AC
  Administered 2013-01-20: 4 mg via INTRAVENOUS
  Filled 2013-01-20: qty 100

## 2013-01-20 MED ORDER — DEXAMETHASONE SODIUM PHOSPHATE 10 MG/ML IJ SOLN
10.0000 mg | Freq: Once | INTRAMUSCULAR | Status: AC
  Administered 2013-01-20: 10 mg via INTRAVENOUS

## 2013-01-20 MED ORDER — SODIUM CHLORIDE 0.9 % IV SOLN
Freq: Once | INTRAVENOUS | Status: AC
  Administered 2013-01-20: 16:00:00 via INTRAVENOUS

## 2013-01-20 MED ORDER — SODIUM CHLORIDE 0.9 % IV SOLN: Freq: Once | INTRAVENOUS | Status: DC

## 2013-01-20 MED ORDER — SODIUM CHLORIDE 0.9 % IJ SOLN
10.0000 mL | INTRAMUSCULAR | Status: DC | PRN
  Administered 2013-01-20: 10 mL
  Filled 2013-01-20: qty 10

## 2013-01-20 MED ORDER — FENTANYL 75 MCG/HR TD PT72: 1.0000 | MEDICATED_PATCH | TRANSDERMAL | Status: DC

## 2013-01-20 NOTE — Patient Instructions (Addendum)
[facility] Discharge Instructions for Patients Receiving Chemotherapy  Today you received the following chemotherapy agents Kyprolis  And Zometa.   To help prevent nausea and vomiting after your treatment, we encourage you to take your nausea medication as prescribed.   If you develop nausea and vomiting that is not controlled by your nausea medication, call the clinic.   BELOW ARE SYMPTOMS THAT SHOULD BE REPORTED IMMEDIATELY:  *FEVER GREATER THAN 100.5 F  *CHILLS WITH OR WITHOUT FEVER  NAUSEA AND VOMITING THAT IS NOT CONTROLLED WITH YOUR NAUSEA MEDICATION  *UNUSUAL SHORTNESS OF BREATH  *UNUSUAL BRUISING OR BLEEDING  TENDERNESS IN MOUTH AND THROAT WITH OR WITHOUT PRESENCE OF ULCERS  *URINARY PROBLEMS  *BOWEL PROBLEMS  UNUSUAL RASH Items with * indicate a potential emergency and should be followed up as soon as possible.  Feel free to call the clinic you have any questions or concerns. The clinic phone number is [phone].

## 2013-01-20 NOTE — Progress Notes (Signed)
Hematology and Oncology Follow Up Visit  MAIREN Howe [id number] 03-27-1951 62 y.o. 01/20/2013 4:13 PM      Principle Diagnosis:  This is a 62 year old female with the following issues:   1. 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  She had a free kappa light chain around 1500. 2. Diagnosis of deep vein thrombosis in July 2011.   Prior Therapy:    1. Initially treated with melphalan and prednisone.  She had a partial response with free light chains down as low as 19, M spike down to 0.52 g/dL.  2. Patient treated with Revlimid at 25 mg 3 weeks on and 1 week off between November 2009 until November 2010.  She had a partial response, light chains down to 33 and M spike was not detected.   3. She was treated on maintenance Revlimid 15 mg to maintain her partial response. 4. She was on Zometa intermittently, on hold know. 5. Currently anticoagulated with Coumadin 7.5 mg since July 2011. 6. She received Velcade 1.3 mg/sq m for a total of 2.9 mg weekly, which started on 02/21/2011 with weekly dexamethasone .  Treatment has been on hold due to recurrent hospitalization for wound infection. Treatment restarted in in 06/2011 till 09/2011. 7. Kyprolis started on 02/04/12. She completed 6 months of therapy on 07/08/12  Current therapy: Resumed Kyprolis on 11/25/12. She is here for cycle 9 day 1  Interim History:  [name] presents today for routine followup prior to chemotherapy.  This is a pleasant 62 year old female with a few comorbid conditions as outlined above. Seen in the ER for leg pain earlier this week. She is reporting pain to her pelvic area with radiation of pain down both legs. Reports that legs feel tight. No numbness. No neurological symptoms. She is able to ambulate. She takes Percocet 2 tabs every 4 hours which helps (8-12 tabs per day). Her fentanyl patch is at 50 mcg; she would like to  increase this dose. She is still ambulating without problems. She has bilateral LE edema. She stopped her Lasix about 3 weeks ago for unclear reasons. She is tolerating chemotherapy well.   Medications: I have reviewed the patient's current medications.  Allergies:  Allergies  Allergen Reactions  . Ibuprofen Other (See Comments)    Patient does not remember.   . Morphine And Related Other (See Comments)    Patient does not remember reaction, believes it caused raised marks on skin.    Past Medical History, Surgical history, Social history, and Family History were reviewed and updated.  Review of Systems: Constitutional:  Negative for fever, chills, night sweats, anorexia, weight loss, pain. Cardiovascular: no chest pain or dyspnea on exertion Respiratory: no cough, shortness of breath, or wheezing Neurological: no TIA or stroke symptoms Dermatological: negative ENT: negative Skin: Negative. Gastrointestinal: no abdominal pain, change in bowel habits, or black or bloody stools Genito-Urinary: no dysuria, trouble voiding, or hematuria Hematological and Lymphatic: negative Breast: negative for breast lumps Musculoskeletal: negative Remaining ROS negative.  Physical Exam: Blood pressure 133/54, pulse 89, temperature 98.5 F (36.9 C), temperature source Oral, resp. rate 18. ECOG: 1 General appearance: alert Head: Normocephalic, without obvious abnormality, atraumatic Neck: no adenopathy, no carotid bruit, no JVD, supple, symmetrical, trachea midline and thyroid not enlarged, symmetric, no tenderness/mass/nodules Lymph nodes: Cervical, supraclavicular, and axillary nodes normal. Heart:regular rate and rhythm, S1, S2 normal, no murmur, click, rub or gallop  Lung:chest clear, no wheezing, rales, normal symmetric air entry, Heart exam - S1, S2 normal, no murmur, no gallop, rate regular Abdomen: soft, non-tender, without masses or organomegaly. Wound appear have healed. EXT:no erythema,  induration, or nodules. 2-3+ bilateral edema in the ankles/feet.   Lab Results: Lab Results  Component Value Date   WBC 3.4* 01/20/2013   HGB 11.1* 01/20/2013   HCT 33.8* 01/20/2013   MCV 88.7 01/20/2013   PLT 162 01/20/2013     Chemistry      Component Value Date/Time   NA 143 01/20/2013 1409   NA 142 01/16/2013 1720   K 3.9 01/20/2013 1409   K 3.6 01/16/2013 1720   CL 110 01/16/2013 1720   CL 110* 12/02/2012 1444   CO2 24 01/20/2013 1409   CO2 26 01/16/2013 1720   BUN 9.5 01/20/2013 1409   BUN 9 01/16/2013 1720   CREATININE 1.0 01/20/2013 1409   CREATININE 0.94 01/16/2013 1720      Component Value Date/Time   CALCIUM 9.5 01/20/2013 1409   CALCIUM 9.6 01/16/2013 1720   ALKPHOS 63 01/20/2013 1409   ALKPHOS 57 02/11/2012 1445   AST 24 01/20/2013 1409   AST 15 02/11/2012 1445   ALT 13 01/20/2013 1409   ALT 12 02/11/2012 1445   BILITOT 0.89 01/20/2013 1409   BILITOT 0.5 02/11/2012 1445     Results for Sara, Howe (MRN [id number]) as of 01/05/2013 15:05  Ref. Range 11/20/2012 13:55 12/22/2012 14:32  Kappa free light chain Latest Range: 0.33-1.94 mg/dL 4098.11 (H) 9147.82 (H)   Impression and Plan:  This is a 62 year old female with the following issues:  1. Light chain multiple myeloma. Recently completed Kyprolis with improvement in light chains. Kappa light chains were up to 1200 but dropped down to 1020 with treatment. She is clinically doing better on therapy. Recommend that she proceed with her kyprolis as scheduled.  2. Hypercalcemia. Calcium is normal today. She will receive Zometa today.  3. Pain in her hips/legs: Will obtain and MRI of the pelvic area. I have increased her Fentanyl to 75 mcg. I have changed her from Percocet to Oxycodone to eliminate the Tylenol that she has been taking. 4. Anemia, multifactorial. Hemoglobin stable. 5. History of deep vein thrombosis. Previously anticoagulated with Coumadin. No new clots noted. No more anticoagulation at this time.  6. Hypertension  seems to be under relatively good control.  7. LE edema. Likely dependent edema. I have recommended that she resume her Lasix 40 mg daily. I have also instructed her to take KDur 20 Meq daily. She is out of K+ and I have refilled this.  8. IV access: PAC in place.  9. Follow up. In 2 weeks as scheduled    Clenton Pare 7/30/20144:13 PM

## 2013-01-21 ENCOUNTER — Ambulatory Visit (HOSPITAL_BASED_OUTPATIENT_CLINIC_OR_DEPARTMENT_OTHER): Payer: Medicaid Other

## 2013-01-21 VITALS — BP 135/56 | HR 88 | Temp 98.4°F | Resp 20

## 2013-01-21 DIAGNOSIS — C9000 Multiple myeloma not having achieved remission: Secondary | ICD-10-CM

## 2013-01-21 DIAGNOSIS — C9002 Multiple myeloma in relapse: Secondary | ICD-10-CM

## 2013-01-21 DIAGNOSIS — Z5112 Encounter for antineoplastic immunotherapy: Secondary | ICD-10-CM

## 2013-01-21 MED ORDER — ONDANSETRON 8 MG/50ML IVPB (CHCC)
8.0000 mg | Freq: Once | INTRAVENOUS | Status: AC
  Administered 2013-01-21: 8 mg via INTRAVENOUS

## 2013-01-21 MED ORDER — SODIUM CHLORIDE 0.9 % IJ SOLN
10.0000 mL | INTRAMUSCULAR | Status: DC | PRN
  Administered 2013-01-21: 10 mL
  Filled 2013-01-21: qty 10

## 2013-01-21 MED ORDER — HEPARIN SOD (PORK) LOCK FLUSH 100 UNIT/ML IV SOLN
500.0000 [IU] | Freq: Once | INTRAVENOUS | Status: AC | PRN
  Administered 2013-01-21: 500 [IU]
  Filled 2013-01-21: qty 5

## 2013-01-21 MED ORDER — SODIUM CHLORIDE 0.9 % IV SOLN
Freq: Once | INTRAVENOUS | Status: AC
Start: 2013-01-21 — End: 2013-01-21
  Administered 2013-01-21: 15:00:00 via INTRAVENOUS

## 2013-01-21 MED ORDER — CARFILZOMIB CHEMO INJECTION 60 MG
20.0000 mg/m2 | Freq: Once | INTRAVENOUS | Status: AC
  Administered 2013-01-21: 46 mg via INTRAVENOUS
  Filled 2013-01-21: qty 23

## 2013-01-21 MED ORDER — DEXAMETHASONE SODIUM PHOSPHATE 10 MG/ML IJ SOLN
10.0000 mg | Freq: Once | INTRAMUSCULAR | Status: AC
  Administered 2013-01-21: 10 mg via INTRAVENOUS

## 2013-01-21 NOTE — Patient Instructions (Addendum)
[facility] Discharge Instructions for Patients Receiving Chemotherapy  Today you received the following chemotherapy agents Kyprolis To help prevent nausea and vomiting after your treatment, we encourage you to take your nausea medication as prescribed.  If you develop nausea and vomiting that is not controlled by your nausea medication, call the clinic.   BELOW ARE SYMPTOMS THAT SHOULD BE REPORTED IMMEDIATELY:  *FEVER GREATER THAN 100.5 F  *CHILLS WITH OR WITHOUT FEVER  NAUSEA AND VOMITING THAT IS NOT CONTROLLED WITH YOUR NAUSEA MEDICATION  *UNUSUAL SHORTNESS OF BREATH  *UNUSUAL BRUISING OR BLEEDING  TENDERNESS IN MOUTH AND THROAT WITH OR WITHOUT PRESENCE OF ULCERS  *URINARY PROBLEMS  *BOWEL PROBLEMS  UNUSUAL RASH Items with * indicate a potential emergency and should be followed up as soon as possible.  Feel free to call the clinic you have any questions or concerns. The clinic phone number is [phone].    

## 2013-01-22 LAB — KAPPA/LAMBDA LIGHT CHAINS
Kappa free light chain: 593 mg/dL — ABNORMAL HIGH (ref 0.33–1.94)
Lambda Free Lght Chn: 0.09 mg/dL — ABNORMAL LOW (ref 0.57–2.63)

## 2013-01-22 LAB — SPEP & IFE WITH QIG
Alpha-1-Globulin: 6.3 % — ABNORMAL HIGH (ref 2.9–4.9)
Alpha-2-Globulin: 11.6 % (ref 7.1–11.8)
Beta Globulin: 5 % (ref 4.7–7.2)
Total Protein, Serum Electrophoresis: 6.4 g/dL (ref 6.0–8.3)

## 2013-01-27 ENCOUNTER — Other Ambulatory Visit (HOSPITAL_BASED_OUTPATIENT_CLINIC_OR_DEPARTMENT_OTHER): Payer: Medicaid Other | Admitting: Lab

## 2013-01-27 ENCOUNTER — Ambulatory Visit (HOSPITAL_BASED_OUTPATIENT_CLINIC_OR_DEPARTMENT_OTHER): Payer: Medicaid Other

## 2013-01-27 VITALS — BP 110/73 | HR 75 | Temp 96.8°F | Resp 16

## 2013-01-27 DIAGNOSIS — C9000 Multiple myeloma not having achieved remission: Secondary | ICD-10-CM

## 2013-01-27 DIAGNOSIS — Z5112 Encounter for antineoplastic immunotherapy: Secondary | ICD-10-CM

## 2013-01-27 LAB — CBC WITH DIFFERENTIAL/PLATELET
Eosinophils Absolute: 0 10*3/uL (ref 0.0–0.5)
HGB: 11.3 g/dL — ABNORMAL LOW (ref 11.6–15.9)
MONO#: 0.6 10*3/uL (ref 0.1–0.9)
MONO%: 13.2 % (ref 0.0–14.0)
NEUT#: 1.7 10*3/uL (ref 1.5–6.5)
RBC: 3.9 10*6/uL (ref 3.70–5.45)
RDW: 20.2 % — ABNORMAL HIGH (ref 11.2–14.5)
WBC: 4.4 10*3/uL (ref 3.9–10.3)
lymph#: 2 10*3/uL (ref 0.9–3.3)

## 2013-01-27 LAB — COMPREHENSIVE METABOLIC PANEL (CC13)
Albumin: 3.1 g/dL — ABNORMAL LOW (ref 3.5–5.0)
Alkaline Phosphatase: 60 U/L (ref 40–150)
CO2: 27 mEq/L (ref 22–29)
Calcium: 8.5 mg/dL (ref 8.4–10.4)
Chloride: 109 mEq/L (ref 98–109)
Glucose: 83 mg/dl (ref 70–140)
Potassium: 3.9 mEq/L (ref 3.5–5.1)
Sodium: 144 mEq/L (ref 136–145)
Total Protein: 6.6 g/dL (ref 6.4–8.3)

## 2013-01-27 MED ORDER — HEPARIN SOD (PORK) LOCK FLUSH 100 UNIT/ML IV SOLN
500.0000 [IU] | Freq: Once | INTRAVENOUS | Status: AC | PRN
  Administered 2013-01-27: 500 [IU]
  Filled 2013-01-27: qty 5

## 2013-01-27 MED ORDER — DEXTROSE 5 % IV SOLN
20.0000 mg/m2 | Freq: Once | INTRAVENOUS | Status: AC
  Administered 2013-01-27: 46 mg via INTRAVENOUS
  Filled 2013-01-27: qty 23

## 2013-01-27 MED ORDER — DEXAMETHASONE SODIUM PHOSPHATE 10 MG/ML IJ SOLN
10.0000 mg | Freq: Once | INTRAMUSCULAR | Status: AC
  Administered 2013-01-27: 10 mg via INTRAVENOUS

## 2013-01-27 MED ORDER — ONDANSETRON 8 MG/50ML IVPB (CHCC)
8.0000 mg | Freq: Once | INTRAVENOUS | Status: AC
  Administered 2013-01-27: 8 mg via INTRAVENOUS

## 2013-01-27 MED ORDER — SODIUM CHLORIDE 0.9 % IJ SOLN
10.0000 mL | INTRAMUSCULAR | Status: DC | PRN
  Administered 2013-01-27: 10 mL
  Filled 2013-01-27: qty 10

## 2013-01-27 MED ORDER — SODIUM CHLORIDE 0.9 % IV SOLN
Freq: Once | INTRAVENOUS | Status: AC
  Administered 2013-01-27: 16:00:00 via INTRAVENOUS

## 2013-01-27 NOTE — Patient Instructions (Addendum)
 [facility] Discharge Instructions for Patients Receiving Chemotherapy  Today you received the following chemotherapy agents KYPROLIS  To help prevent nausea and vomiting after your treatment, we encourage you to take your nausea medication AS NEEDED. CAN TAKE TONIGHT BEFORE BED.   If you develop nausea and vomiting that is not controlled by your nausea medication, call the clinic.   BELOW ARE SYMPTOMS THAT SHOULD BE REPORTED IMMEDIATELY:  *FEVER GREATER THAN 100.5 F  *CHILLS WITH OR WITHOUT FEVER  NAUSEA AND VOMITING THAT IS NOT CONTROLLED WITH YOUR NAUSEA MEDICATION  *UNUSUAL SHORTNESS OF BREATH  *UNUSUAL BRUISING OR BLEEDING  TENDERNESS IN MOUTH AND THROAT WITH OR WITHOUT PRESENCE OF ULCERS  *URINARY PROBLEMS  *BOWEL PROBLEMS  UNUSUAL RASH Items with * indicate a potential emergency and should be followed up as soon as possible.  Feel free to call the clinic you have any questions or concerns. The clinic phone number is [phone].

## 2013-01-28 ENCOUNTER — Ambulatory Visit (HOSPITAL_BASED_OUTPATIENT_CLINIC_OR_DEPARTMENT_OTHER): Payer: Medicaid Other

## 2013-01-28 VITALS — BP 103/48 | HR 83 | Temp 97.5°F | Resp 18

## 2013-01-28 DIAGNOSIS — C9000 Multiple myeloma not having achieved remission: Secondary | ICD-10-CM

## 2013-01-28 DIAGNOSIS — Z5112 Encounter for antineoplastic immunotherapy: Secondary | ICD-10-CM

## 2013-01-28 MED ORDER — DEXAMETHASONE SODIUM PHOSPHATE 10 MG/ML IJ SOLN
10.0000 mg | Freq: Once | INTRAMUSCULAR | Status: AC
  Administered 2013-01-28: 10 mg via INTRAVENOUS

## 2013-01-28 MED ORDER — HEPARIN SOD (PORK) LOCK FLUSH 100 UNIT/ML IV SOLN
500.0000 [IU] | Freq: Once | INTRAVENOUS | Status: AC | PRN
  Administered 2013-01-28: 500 [IU]
  Filled 2013-01-28: qty 5

## 2013-01-28 MED ORDER — SODIUM CHLORIDE 0.9 % IJ SOLN
10.0000 mL | INTRAMUSCULAR | Status: DC | PRN
  Administered 2013-01-28: 10 mL
  Filled 2013-01-28: qty 10

## 2013-01-28 MED ORDER — ONDANSETRON 8 MG/50ML IVPB (CHCC)
8.0000 mg | Freq: Once | INTRAVENOUS | Status: AC
  Administered 2013-01-28: 8 mg via INTRAVENOUS

## 2013-01-28 MED ORDER — SODIUM CHLORIDE 0.9 % IV SOLN
Freq: Once | INTRAVENOUS | Status: AC
  Administered 2013-01-28: 16:00:00 via INTRAVENOUS

## 2013-01-28 MED ORDER — CARFILZOMIB CHEMO INJECTION 60 MG
20.0000 mg/m2 | Freq: Once | INTRAVENOUS | Status: AC
  Administered 2013-01-28: 46 mg via INTRAVENOUS
  Filled 2013-01-28: qty 23

## 2013-01-28 NOTE — Patient Instructions (Addendum)
[facility] Discharge Instructions for Patients Receiving Chemotherapy  Today you received the following chemotherapy agents :  Kyprolis.  To help prevent nausea and vomiting after your treatment, we encourage you to take your nausea medication as instructed by your physician.   If you develop nausea and vomiting that is not controlled by your nausea medication, call the clinic.   BELOW ARE SYMPTOMS THAT SHOULD BE REPORTED IMMEDIATELY:  *FEVER GREATER THAN 100.5 F  *CHILLS WITH OR WITHOUT FEVER  NAUSEA AND VOMITING THAT IS NOT CONTROLLED WITH YOUR NAUSEA MEDICATION  *UNUSUAL SHORTNESS OF BREATH  *UNUSUAL BRUISING OR BLEEDING  TENDERNESS IN MOUTH AND THROAT WITH OR WITHOUT PRESENCE OF ULCERS  *URINARY PROBLEMS  *BOWEL PROBLEMS  UNUSUAL RASH Items with * indicate a potential emergency and should be followed up as soon as possible.  Feel free to call the clinic you have any questions or concerns. The clinic phone number is [phone].    

## 2013-02-01 ENCOUNTER — Ambulatory Visit (HOSPITAL_COMMUNITY)
Admission: RE | Admit: 2013-02-01 | Discharge: 2013-02-01 | Disposition: A | Discharge status: Home / Self Care | Payer: Medicaid Other | Source: Ambulatory Visit | Attending: Oncology | Admitting: Oncology

## 2013-02-01 DIAGNOSIS — C9000 Multiple myeloma not having achieved remission: Secondary | ICD-10-CM

## 2013-02-03 ENCOUNTER — Telehealth: Payer: Self-pay | Admitting: Oncology

## 2013-02-03 ENCOUNTER — Encounter: Payer: Self-pay | Admitting: *Deleted

## 2013-02-03 ENCOUNTER — Ambulatory Visit (HOSPITAL_BASED_OUTPATIENT_CLINIC_OR_DEPARTMENT_OTHER): Payer: Medicaid Other | Admitting: Oncology

## 2013-02-03 ENCOUNTER — Other Ambulatory Visit (HOSPITAL_BASED_OUTPATIENT_CLINIC_OR_DEPARTMENT_OTHER): Payer: Medicaid Other | Admitting: Lab

## 2013-02-03 ENCOUNTER — Telehealth: Payer: Self-pay | Admitting: *Deleted

## 2013-02-03 ENCOUNTER — Ambulatory Visit: Payer: Medicaid Other | Admitting: Physician Assistant

## 2013-02-03 ENCOUNTER — Ambulatory Visit (HOSPITAL_BASED_OUTPATIENT_CLINIC_OR_DEPARTMENT_OTHER): Payer: Medicaid Other

## 2013-02-03 VITALS — BP 131/81 | HR 89 | Temp 99.6°F | Resp 20

## 2013-02-03 DIAGNOSIS — C9000 Multiple myeloma not having achieved remission: Secondary | ICD-10-CM

## 2013-02-03 DIAGNOSIS — D649 Anemia, unspecified: Secondary | ICD-10-CM

## 2013-02-03 DIAGNOSIS — C9002 Multiple myeloma in relapse: Secondary | ICD-10-CM

## 2013-02-03 DIAGNOSIS — Z86718 Personal history of other venous thrombosis and embolism: Secondary | ICD-10-CM

## 2013-02-03 DIAGNOSIS — I10 Essential (primary) hypertension: Secondary | ICD-10-CM

## 2013-02-03 DIAGNOSIS — Z5112 Encounter for antineoplastic immunotherapy: Secondary | ICD-10-CM

## 2013-02-03 DIAGNOSIS — M25559 Pain in unspecified hip: Secondary | ICD-10-CM

## 2013-02-03 LAB — COMPREHENSIVE METABOLIC PANEL (CC13)
AST: 21 U/L (ref 5–34)
Albumin: 3.2 g/dL — ABNORMAL LOW (ref 3.5–5.0)
Alkaline Phosphatase: 60 U/L (ref 40–150)
Potassium: 3.8 mEq/L (ref 3.5–5.1)
Sodium: 145 mEq/L (ref 136–145)
Total Protein: 6.7 g/dL (ref 6.4–8.3)

## 2013-02-03 LAB — CBC WITH DIFFERENTIAL/PLATELET
EOS%: 0.1 % (ref 0.0–7.0)
Eosinophils Absolute: 0 10*3/uL (ref 0.0–0.5)
MCV: 91 fL (ref 79.5–101.0)
MONO%: 14.4 % — ABNORMAL HIGH (ref 0.0–14.0)
NEUT#: 2.1 10*3/uL (ref 1.5–6.5)
RBC: 3.93 10*6/uL (ref 3.70–5.45)
RDW: 19.4 % — ABNORMAL HIGH (ref 11.2–14.5)

## 2013-02-03 MED ORDER — SODIUM CHLORIDE 0.9 % IV SOLN
Freq: Once | INTRAVENOUS | Status: AC
  Administered 2013-02-03: 16:00:00 via INTRAVENOUS

## 2013-02-03 MED ORDER — OXYCODONE HCL 5 MG PO TABS: 5.0000 mg | ORAL_TABLET | ORAL | Status: DC | PRN

## 2013-02-03 MED ORDER — DEXAMETHASONE SODIUM PHOSPHATE 10 MG/ML IJ SOLN
10.0000 mg | Freq: Once | INTRAMUSCULAR | Status: AC
  Administered 2013-02-03: 10 mg via INTRAVENOUS

## 2013-02-03 MED ORDER — ONDANSETRON 8 MG/50ML IVPB (CHCC)
8.0000 mg | Freq: Once | INTRAVENOUS | Status: AC
  Administered 2013-02-03: 8 mg via INTRAVENOUS

## 2013-02-03 MED ORDER — SODIUM CHLORIDE 0.9 % IJ SOLN
10.0000 mL | INTRAMUSCULAR | Status: DC | PRN
  Administered 2013-02-03: 10 mL
  Filled 2013-02-03: qty 10

## 2013-02-03 MED ORDER — HEPARIN SOD (PORK) LOCK FLUSH 100 UNIT/ML IV SOLN
500.0000 [IU] | Freq: Once | INTRAVENOUS | Status: AC | PRN
  Administered 2013-02-03: 500 [IU]
  Filled 2013-02-03: qty 5

## 2013-02-03 MED ORDER — DEXTROSE 5 % IV SOLN
20.0000 mg/m2 | Freq: Once | INTRAVENOUS | Status: AC
  Administered 2013-02-03: 46 mg via INTRAVENOUS
  Filled 2013-02-03: qty 23

## 2013-02-03 MED ORDER — FENTANYL 100 MCG/HR TD PT72: 1.0000 | MEDICATED_PATCH | TRANSDERMAL | Status: DC

## 2013-02-03 NOTE — Telephone Encounter (Signed)
Per staff message and POF I have scheduled appts.  JMW  

## 2013-02-03 NOTE — Telephone Encounter (Signed)
gv and printed appt sched and avs for pt...MW added tx....wating to response from Dr. Clelia Croft for 8.24.14 appt.

## 2013-02-03 NOTE — Progress Notes (Signed)
Mailed completed forms to [employer] ( scat ) [address] [place] [address], [place] [address]. Unable to get fax to go thru x 3.

## 2013-02-03 NOTE — Patient Instructions (Signed)
[facility] Discharge Instructions for Patients Receiving Chemotherapy  Today you received the following chemotherapy agents Kyprolis To help prevent nausea and vomiting after your treatment, we encourage you to take your nausea medication Compazine 10 mg every 6 hours as needed for nausea.   If you develop nausea and vomiting that is not controlled by your nausea medication, call the clinic.   BELOW ARE SYMPTOMS THAT SHOULD BE REPORTED IMMEDIATELY:  *FEVER GREATER THAN 100.5 F  *CHILLS WITH OR WITHOUT FEVER  NAUSEA AND VOMITING THAT IS NOT CONTROLLED WITH YOUR NAUSEA MEDICATION  *UNUSUAL SHORTNESS OF BREATH  *UNUSUAL BRUISING OR BLEEDING  TENDERNESS IN MOUTH AND THROAT WITH OR WITHOUT PRESENCE OF ULCERS  *URINARY PROBLEMS  *BOWEL PROBLEMS  UNUSUAL RASH Items with * indicate a potential emergency and should be followed up as soon as possible.  Feel free to call the clinic you have any questions or concerns. The clinic phone number is [phone].

## 2013-02-03 NOTE — Progress Notes (Signed)
Hematology and Oncology Follow Up Visit  [name] [id number] 08-22-1950 62 y.o. 02/03/2013 4:06 PM      Principle Diagnosis:  This is a 62 year old female with the following issues:   1. 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  She had a free kappa light chain around 1500. 2. Diagnosis of deep vein thrombosis in July 2011.   Prior Therapy:    1. Initially treated with melphalan and prednisone.  She had a partial response with free light chains down as low as 19, M spike down to 0.52 g/dL.  2. Patient treated with Revlimid at 25 mg 3 weeks on and 1 week off between November 2009 until November 2010.  She had a partial response, light chains down to 33 and M spike was not detected.   3. She was treated on maintenance Revlimid 15 mg to maintain her partial response. 4. She was on Zometa intermittently, on hold know. 5. Currently anticoagulated with Coumadin 7.5 mg since July 2011. 6. She received Velcade 1.3 mg/sq m for a total of 2.9 mg weekly, which started on 02/21/2011 with weekly dexamethasone .  Treatment has been on hold due to recurrent hospitalization for wound infection. Treatment restarted in in 06/2011 till 09/2011. 7. Kyprolis started on 02/04/12. She completed 6 months of therapy on 07/08/12  Current therapy: Resumed Kyprolis on 11/25/12. She is here for cycle 9 day 15.   Interim History:  [name] presents today for routine followup prior to chemotherapy.This is a pleasant 62 year old female with a few comorbid conditions as outlined above. She was seen in the ER for leg pain about  Two weeks ago. She is reporting pain to her pelvic area with radiation of pain down both legs. Reports that legs feel tight. No numbness. No neurological symptoms. She is able to ambulate. She takes Oxycodone 2 tabs every 4 hours which helps (8-12 tabs per day). Her fentanyl patch is at ; she would  like to increase this dose again. She is still ambulating without problems.She is tolerating chemotherapy well.  She reports most of the pain happens when she moves after prolonged period of immobility.   Medications: I have reviewed the patient's current medications.  Allergies:  Allergies  Allergen Reactions  . Ibuprofen Other (See Comments)    Patient does not remember.   . Morphine And Related Other (See Comments)    Patient does not remember reaction, believes it caused raised marks on skin.    Past Medical History, Surgical history, Social history, and Family History were reviewed and updated.  Review of Systems: Constitutional:  Negative for fever, chills, night sweats, anorexia, weight loss, pain. Cardiovascular: no chest pain or dyspnea on exertion Respiratory: no cough, shortness of breath, or wheezing Neurological: no TIA or stroke symptoms Dermatological: negative ENT: negative Skin: Negative. Gastrointestinal: no abdominal pain, change in bowel habits, or black or bloody stools Genito-Urinary: no dysuria, trouble voiding, or hematuria Hematological and Lymphatic: negative Breast: negative for breast lumps Musculoskeletal: negative Remaining ROS negative.  Physical Exam: Blood pressure 131/81, pulse 89, temperature 99.6 F (37.6 C), temperature source Oral, resp. rate 20. ECOG: 1 General appearance: alert Head: Normocephalic, without obvious abnormality, atraumatic Neck: no adenopathy, no carotid bruit, no JVD, supple, symmetrical, trachea midline and thyroid not enlarged, symmetric, no tenderness/mass/nodules Lymph nodes: Cervical, supraclavicular, and axillary nodes normal. Heart:regular rate and rhythm, S1, S2 normal, no murmur, click, rub  or gallop Lung:chest clear, no wheezing, rales, normal symmetric air entry, Heart exam - S1, S2 normal, no murmur, no gallop, rate regular Abdomen: soft, non-tender, without masses or organomegaly. Wound appear have  healed. EXT:no erythema, induration, or nodules. 2-3+ bilateral edema in the ankles/feet. Neurological: No deficits detected with limited exam.   Lab Results: Lab Results  Component Value Date   WBC 4.2 02/03/2013   HGB 11.6 02/03/2013   HCT 35.7 02/03/2013   MCV 91.0 02/03/2013   PLT 151 02/03/2013     Chemistry      Component Value Date/Time   NA 145 02/03/2013 1453   NA 142 01/16/2013 1720   K 3.8 02/03/2013 1453   K 3.6 01/16/2013 1720   CL 110 01/16/2013 1720   CL 110* 12/02/2012 1444   CO2 25 02/03/2013 1453   CO2 26 01/16/2013 1720   BUN 10.8 02/03/2013 1453   BUN 9 01/16/2013 1720   CREATININE 1.0 02/03/2013 1453   CREATININE 0.94 01/16/2013 1720      Component Value Date/Time   CALCIUM 8.9 02/03/2013 1453   CALCIUM 9.6 01/16/2013 1720   ALKPHOS 60 02/03/2013 1453   ALKPHOS 57 02/11/2012 1445   AST 21 02/03/2013 1453   AST 15 02/11/2012 1445   ALT 12 02/03/2013 1453   ALT 12 02/11/2012 1445   BILITOT 1.30* 02/03/2013 1453   BILITOT 0.5 02/11/2012 1445      Results for [name] (MRN [id number]) as of 02/03/2013 16:09  Ref. Range 11/20/2012 13:55 12/22/2012 14:32 01/20/2013 14:09  Kappa free light chain Latest Range: 0.33-1.94 mg/dL 1191.47 (H) 8295.62 (H) 593.00 (H)   Impression and Plan:  This is a 62 year old female with the following issues:  1. Light chain multiple myeloma. Recently completed Kyprolis with improvement in light chains. Kappa light chains were up to 1200 but dropped down to 593 after chemotherapy restart. She is clinically doing better on therapy. Recommend that she proceed with her kyprolis as scheduled. She will complete cycle 9 today and resume cycle 10 on on 8/26.  2. Hypercalcemia. Calcium is normal today. She will receive Zometa in 6 weeks.  3. Pain in her hips/legs: Will obtain and MRI of the pelvic area. I have increased her Fentanyl to 100 mcg. She was not able to get the MRI done last week and needs an open MRI. Unclear etiology but doubt any  epidural involvement.  4. Anemia, multifactorial. Hemoglobin stable. 5. History of deep vein thrombosis. Previously anticoagulated with Coumadin. No new clots noted. No more anticoagulation at this time.  6. Hypertension seems to be under relatively good control.  7. LE edema. Resolved now  8. IV access: PAC in place.  9. Follow up. In 2 weeks as scheduled    [facility] 8/13/20144:06 PM

## 2013-02-04 ENCOUNTER — Ambulatory Visit (HOSPITAL_BASED_OUTPATIENT_CLINIC_OR_DEPARTMENT_OTHER): Payer: Medicaid Other

## 2013-02-04 VITALS — BP 121/74 | HR 75 | Temp 99.0°F | Resp 20

## 2013-02-04 DIAGNOSIS — C9002 Multiple myeloma in relapse: Secondary | ICD-10-CM

## 2013-02-04 DIAGNOSIS — Z5112 Encounter for antineoplastic immunotherapy: Secondary | ICD-10-CM

## 2013-02-04 DIAGNOSIS — C9000 Multiple myeloma not having achieved remission: Secondary | ICD-10-CM

## 2013-02-04 MED ORDER — DEXTROSE 5 % IV SOLN
20.0000 mg/m2 | Freq: Once | INTRAVENOUS | Status: AC
  Administered 2013-02-04: 46 mg via INTRAVENOUS
  Filled 2013-02-04: qty 23

## 2013-02-04 MED ORDER — SODIUM CHLORIDE 0.9 % IV SOLN
Freq: Once | INTRAVENOUS | Status: AC
  Administered 2013-02-04: 15:00:00 via INTRAVENOUS

## 2013-02-04 MED ORDER — DEXAMETHASONE SODIUM PHOSPHATE 10 MG/ML IJ SOLN
10.0000 mg | Freq: Once | INTRAMUSCULAR | Status: AC
  Administered 2013-02-04: 10 mg via INTRAVENOUS

## 2013-02-04 MED ORDER — ONDANSETRON 8 MG/50ML IVPB (CHCC)
8.0000 mg | Freq: Once | INTRAVENOUS | Status: AC
  Administered 2013-02-04: 8 mg via INTRAVENOUS

## 2013-02-04 MED ORDER — SODIUM CHLORIDE 0.9 % IJ SOLN
10.0000 mL | INTRAMUSCULAR | Status: DC | PRN
  Administered 2013-02-04: 10 mL
  Filled 2013-02-04: qty 10

## 2013-02-04 MED ORDER — HEPARIN SOD (PORK) LOCK FLUSH 100 UNIT/ML IV SOLN
500.0000 [IU] | Freq: Once | INTRAVENOUS | Status: AC | PRN
  Administered 2013-02-04: 500 [IU]
  Filled 2013-02-04: qty 5

## 2013-02-04 NOTE — Patient Instructions (Addendum)
[facility] Discharge Instructions for Patients Receiving Chemotherapy  Today you received the following chemotherapy agents Kyprolis To help prevent nausea and vomiting after your treatment, we encourage you to take your nausea medication as prescribed.  If you develop nausea and vomiting that is not controlled by your nausea medication, call the clinic.   BELOW ARE SYMPTOMS THAT SHOULD BE REPORTED IMMEDIATELY:  *FEVER GREATER THAN 100.5 F  *CHILLS WITH OR WITHOUT FEVER  NAUSEA AND VOMITING THAT IS NOT CONTROLLED WITH YOUR NAUSEA MEDICATION  *UNUSUAL SHORTNESS OF BREATH  *UNUSUAL BRUISING OR BLEEDING  TENDERNESS IN MOUTH AND THROAT WITH OR WITHOUT PRESENCE OF ULCERS  *URINARY PROBLEMS  *BOWEL PROBLEMS  UNUSUAL RASH Items with * indicate a potential emergency and should be followed up as soon as possible.  Feel free to call the clinic you have any questions or concerns. The clinic phone number is [phone].    

## 2013-02-10 ENCOUNTER — Encounter: Payer: Self-pay | Admitting: *Deleted

## 2013-02-11 ENCOUNTER — Other Ambulatory Visit: Payer: Self-pay | Admitting: Oncology

## 2013-02-17 ENCOUNTER — Other Ambulatory Visit: Payer: Self-pay | Admitting: Oncology

## 2013-02-17 ENCOUNTER — Encounter: Payer: Self-pay | Admitting: Oncology

## 2013-02-17 ENCOUNTER — Ambulatory Visit (HOSPITAL_BASED_OUTPATIENT_CLINIC_OR_DEPARTMENT_OTHER): Payer: Medicaid Other

## 2013-02-17 ENCOUNTER — Ambulatory Visit (HOSPITAL_BASED_OUTPATIENT_CLINIC_OR_DEPARTMENT_OTHER): Payer: Medicaid Other | Admitting: Oncology

## 2013-02-17 ENCOUNTER — Other Ambulatory Visit: Payer: Self-pay | Admitting: *Deleted

## 2013-02-17 ENCOUNTER — Other Ambulatory Visit (HOSPITAL_BASED_OUTPATIENT_CLINIC_OR_DEPARTMENT_OTHER): Payer: Medicaid Other | Admitting: Lab

## 2013-02-17 VITALS — BP 104/64 | HR 77 | Temp 97.1°F | Resp 19 | Ht 65.0 in

## 2013-02-17 DIAGNOSIS — C9000 Multiple myeloma not having achieved remission: Secondary | ICD-10-CM

## 2013-02-17 DIAGNOSIS — C9002 Multiple myeloma in relapse: Secondary | ICD-10-CM

## 2013-02-17 DIAGNOSIS — Z86718 Personal history of other venous thrombosis and embolism: Secondary | ICD-10-CM

## 2013-02-17 DIAGNOSIS — Z5112 Encounter for antineoplastic immunotherapy: Secondary | ICD-10-CM

## 2013-02-17 LAB — CBC WITH DIFFERENTIAL/PLATELET
Basophils Absolute: 0 10*3/uL (ref 0.0–0.1)
Eosinophils Absolute: 0 10*3/uL (ref 0.0–0.5)
HCT: 35.2 % (ref 34.8–46.6)
HGB: 11.4 g/dL — ABNORMAL LOW (ref 11.6–15.9)
MCH: 29.6 pg (ref 25.1–34.0)
MONO#: 0.3 10*3/uL (ref 0.1–0.9)
NEUT#: 1.9 10*3/uL (ref 1.5–6.5)
NEUT%: 49.8 % (ref 38.4–76.8)
RDW: 17.3 % — ABNORMAL HIGH (ref 11.2–14.5)
WBC: 3.8 10*3/uL — ABNORMAL LOW (ref 3.9–10.3)
lymph#: 1.5 10*3/uL (ref 0.9–3.3)

## 2013-02-17 LAB — COMPREHENSIVE METABOLIC PANEL (CC13)
Albumin: 3 g/dL — ABNORMAL LOW (ref 3.5–5.0)
BUN: 9.3 mg/dL (ref 7.0–26.0)
CO2: 25 mEq/L (ref 22–29)
Calcium: 9.4 mg/dL (ref 8.4–10.4)
Chloride: 112 mEq/L — ABNORMAL HIGH (ref 98–109)
Glucose: 80 mg/dl (ref 70–140)
Potassium: 4 mEq/L (ref 3.5–5.1)
Sodium: 145 mEq/L (ref 136–145)
Total Protein: 6.5 g/dL (ref 6.4–8.3)

## 2013-02-17 MED ORDER — SODIUM CHLORIDE 0.9 % IV SOLN
Freq: Once | INTRAVENOUS | Status: AC
  Administered 2013-02-17: 16:00:00 via INTRAVENOUS

## 2013-02-17 MED ORDER — HEPARIN SOD (PORK) LOCK FLUSH 100 UNIT/ML IV SOLN
500.0000 [IU] | Freq: Once | INTRAVENOUS | Status: AC | PRN
  Administered 2013-02-17: 500 [IU]
  Filled 2013-02-17: qty 5

## 2013-02-17 MED ORDER — OXYCODONE HCL 5 MG PO TABS: 5.0000 mg | ORAL_TABLET | ORAL | Status: DC | PRN

## 2013-02-17 MED ORDER — DEXAMETHASONE SODIUM PHOSPHATE 10 MG/ML IJ SOLN
10.0000 mg | Freq: Once | INTRAMUSCULAR | Status: AC
  Administered 2013-02-17: 10 mg via INTRAVENOUS

## 2013-02-17 MED ORDER — DEXTROSE 5 % IV SOLN
20.0000 mg/m2 | Freq: Once | INTRAVENOUS | Status: AC
  Administered 2013-02-17: 46 mg via INTRAVENOUS
  Filled 2013-02-17: qty 23

## 2013-02-17 MED ORDER — SODIUM CHLORIDE 0.9 % IJ SOLN
10.0000 mL | INTRAMUSCULAR | Status: DC | PRN
  Administered 2013-02-17: 10 mL
  Filled 2013-02-17: qty 10

## 2013-02-17 MED ORDER — ONDANSETRON 8 MG/50ML IVPB (CHCC)
8.0000 mg | Freq: Once | INTRAVENOUS | Status: AC
  Administered 2013-02-17: 8 mg via INTRAVENOUS

## 2013-02-17 NOTE — Patient Instructions (Addendum)
[facility] Discharge Instructions for Patients Receiving Chemotherapy  Today you received the following chemotherapy agents Kyprolis To help prevent nausea and vomiting after your treatment, we encourage you to take your nausea medication as prescribed.  If you develop nausea and vomiting that is not controlled by your nausea medication, call the clinic.   BELOW ARE SYMPTOMS THAT SHOULD BE REPORTED IMMEDIATELY:  *FEVER GREATER THAN 100.5 F  *CHILLS WITH OR WITHOUT FEVER  NAUSEA AND VOMITING THAT IS NOT CONTROLLED WITH YOUR NAUSEA MEDICATION  *UNUSUAL SHORTNESS OF BREATH  *UNUSUAL BRUISING OR BLEEDING  TENDERNESS IN MOUTH AND THROAT WITH OR WITHOUT PRESENCE OF ULCERS  *URINARY PROBLEMS  *BOWEL PROBLEMS  UNUSUAL RASH Items with * indicate a potential emergency and should be followed up as soon as possible.  Feel free to call the clinic you have any questions or concerns. The clinic phone number is [phone].    

## 2013-02-17 NOTE — Progress Notes (Signed)
Hematology and Oncology Follow Up Visit  [name] [id number] April 12, 1951 62 y.o. 02/17/2013 3:45 PM      Principle Diagnosis:  This is a 62 year old female with the following issues:   1. 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  She had a free kappa light chain around 1500. 2. Diagnosis of deep vein thrombosis in July 2011.   Prior Therapy:    1. Initially treated with melphalan and prednisone.  She had a partial response with free light chains down as low as 19, M spike down to 0.52 g/dL.  2. Patient treated with Revlimid at 25 mg 3 weeks on and 1 week off between November 2009 until November 2010.  She had a partial response, light chains down to 33 and M spike was not detected.   3. She was treated on maintenance Revlimid 15 mg to maintain her partial response. 4. She was on Zometa intermittently, on hold know. 5. Currently anticoagulated with Coumadin 7.5 mg since July 2011. 6. She received Velcade 1.3 mg/sq m for a total of 2.9 mg weekly, which started on 02/21/2011 with weekly dexamethasone .  Treatment has been on hold due to recurrent hospitalization for wound infection. Treatment restarted in in 06/2011 till 09/2011. 7. Kyprolis started on 02/04/12. She completed 6 months of therapy on 07/08/12  Current therapy: Resumed Kyprolis on 11/25/12. She is here for cycle 10 day 1.   Interim History:  Ms. Westerhoff presents today for routine followup prior to chemotherapy.This is a pleasant 62 year old female with a few comorbid conditions as outlined above. Pain is better with the increase in her Fentanyl patch to 100 mcg/hr. No neurological symptoms. She is able to ambulate. She less oxycodone for breakthrough pain now. She is still ambulating without problems.She is tolerating chemotherapy well.   Medications: I have reviewed the patient's current medications.  Allergies:  Allergies   Allergen Reactions  . Ibuprofen Other (See Comments)    Patient does not remember.   . Morphine And Related Other (See Comments)    Patient does not remember reaction, believes it caused raised marks on skin.    Past Medical History, Surgical history, Social history, and Family History were reviewed and updated.  Review of Systems: Constitutional:  Negative for fever, chills, night sweats, anorexia, weight loss, pain. Cardiovascular: no chest pain or dyspnea on exertion Respiratory: no cough, shortness of breath, or wheezing Neurological: no TIA or stroke symptoms Dermatological: negative ENT: negative Skin: Negative. Gastrointestinal: no abdominal pain, change in bowel habits, or black or bloody stools Genito-Urinary: no dysuria, trouble voiding, or hematuria Hematological and Lymphatic: negative Breast: negative for breast lumps Musculoskeletal: negative Remaining ROS negative.  Physical Exam: Blood pressure 104/64, pulse 77, temperature 97.1 F (36.2 C), temperature source Oral, resp. rate 19, height 5\' 5"  (1.651 m). ECOG: 1 General appearance: alert Head: Normocephalic, without obvious abnormality, atraumatic Neck: no adenopathy, no carotid bruit, no JVD, supple, symmetrical, trachea midline and thyroid not enlarged, symmetric, no tenderness/mass/nodules Lymph nodes: Cervical, supraclavicular, and axillary nodes normal. Heart:regular rate and rhythm, S1, S2 normal, no murmur, click, rub or gallop Lung:chest clear, no wheezing, rales, normal symmetric air entry, Heart exam - S1, S2 normal, no murmur, no gallop, rate regular Abdomen: soft, non-tender, without masses or organomegaly. Wound appear have healed. EXT:no erythema, induration, or nodules. 2-3+ bilateral edema in the ankles/feet. Neurological: No deficits detected with limited exam.   Lab  Results: Lab Results  Component Value Date   WBC 3.8* 02/17/2013   HGB 11.4* 02/17/2013   HCT 35.2 02/17/2013   MCV 91.4  02/17/2013   PLT 171 02/17/2013     Chemistry      Component Value Date/Time   NA 145 02/17/2013 1448   NA 142 01/16/2013 1720   K 4.0 02/17/2013 1448   K 3.6 01/16/2013 1720   CL 110 01/16/2013 1720   CL 110* 12/02/2012 1444   CO2 25 02/17/2013 1448   CO2 26 01/16/2013 1720   BUN 9.3 02/17/2013 1448   BUN 9 01/16/2013 1720   CREATININE 1.1 02/17/2013 1448   CREATININE 0.94 01/16/2013 1720      Component Value Date/Time   CALCIUM 9.4 02/17/2013 1448   CALCIUM 9.6 01/16/2013 1720   ALKPHOS 57 02/17/2013 1448   ALKPHOS 57 02/11/2012 1445   AST 23 02/17/2013 1448   AST 15 02/11/2012 1445   ALT 16 02/17/2013 1448   ALT 12 02/11/2012 1445   BILITOT 0.65 02/17/2013 1448   BILITOT 0.5 02/11/2012 1445      Results for ARAYLA, KRUSCHKE (MRN [id number]) as of 02/03/2013 16:09  Ref. Range 11/20/2012 13:55 12/22/2012 14:32 01/20/2013 14:09  Kappa free light chain Latest Range: 0.33-1.94 mg/dL 1308.65 (H) 7846.96 (H) 593.00 (H)   Impression and Plan:  This is a 62 year old female with the following issues:  1. Light chain multiple myeloma. Recently completed Kyprolis with improvement in light chains. Kappa light chains were up to 1200 but dropped down to 593 after chemotherapy restart. She is clinically doing better on therapy. Recommend that she proceed with her kyprolis as scheduled. Recommend that she proceed with cycle 10 day 1 today without dose modification.  2. Hypercalcemia. Calcium is normal today. She will receive Zometa every 6 weeks.  3. Pain in her hips/legs: Better with Fentanyl to 100 mcg/hr. She has not yet been able to get an open MRI. She has requested to cancel this test at this time as her pain is better. 4. Anemia, multifactorial. Hemoglobin stable. 5. History of deep vein thrombosis. Previously anticoagulated with Coumadin. No new clots noted. No more anticoagulation at this time.  6. Hypertension seems to be under relatively good control.  7. LE edema. Resolved now  8. IV access: PAC  in place.  9. Follow up. In 4 weeks as scheduled    Clenton Pare 8/27/20143:45 PM

## 2013-02-18 ENCOUNTER — Ambulatory Visit (HOSPITAL_BASED_OUTPATIENT_CLINIC_OR_DEPARTMENT_OTHER): Payer: Medicaid Other

## 2013-02-18 VITALS — BP 124/72 | HR 83 | Temp 98.9°F | Resp 16

## 2013-02-18 DIAGNOSIS — Z5112 Encounter for antineoplastic immunotherapy: Secondary | ICD-10-CM

## 2013-02-18 DIAGNOSIS — C9002 Multiple myeloma in relapse: Secondary | ICD-10-CM

## 2013-02-18 DIAGNOSIS — C9000 Multiple myeloma not having achieved remission: Secondary | ICD-10-CM

## 2013-02-18 MED ORDER — SODIUM CHLORIDE 0.9 % IV SOLN: Freq: Once | INTRAVENOUS | Status: DC

## 2013-02-18 MED ORDER — HEPARIN SOD (PORK) LOCK FLUSH 100 UNIT/ML IV SOLN
500.0000 [IU] | Freq: Once | INTRAVENOUS | Status: AC | PRN
  Administered 2013-02-18: 500 [IU]
  Filled 2013-02-18: qty 5

## 2013-02-18 MED ORDER — DEXAMETHASONE SODIUM PHOSPHATE 10 MG/ML IJ SOLN
10.0000 mg | Freq: Once | INTRAMUSCULAR | Status: AC
  Administered 2013-02-18: 10 mg via INTRAVENOUS

## 2013-02-18 MED ORDER — ONDANSETRON 8 MG/50ML IVPB (CHCC)
8.0000 mg | Freq: Once | INTRAVENOUS | Status: AC
  Administered 2013-02-18: 8 mg via INTRAVENOUS

## 2013-02-18 MED ORDER — DEXTROSE 5 % IV SOLN
20.0000 mg/m2 | Freq: Once | INTRAVENOUS | Status: AC
  Administered 2013-02-18: 46 mg via INTRAVENOUS
  Filled 2013-02-18: qty 23

## 2013-02-18 MED ORDER — SODIUM CHLORIDE 0.9 % IJ SOLN
10.0000 mL | INTRAMUSCULAR | Status: DC | PRN
  Administered 2013-02-18: 10 mL
  Filled 2013-02-18: qty 10

## 2013-02-18 MED ORDER — SODIUM CHLORIDE 0.9 % IV SOLN
Freq: Once | INTRAVENOUS | Status: AC
  Administered 2013-02-18: 15:00:00 via INTRAVENOUS

## 2013-02-18 NOTE — Patient Instructions (Addendum)
[facility] Discharge Instructions for Patients Receiving Chemotherapy  Today you received the following chemotherapy agents KYPROLIS   To help prevent nausea and vomiting after your treatment, we encourage you to take your nausea medication IF NEEDED.   If you develop nausea and vomiting that is not controlled by your nausea medication, call the clinic.   BELOW ARE SYMPTOMS THAT SHOULD BE REPORTED IMMEDIATELY:  *FEVER GREATER THAN 100.5 F  *CHILLS WITH OR WITHOUT FEVER  NAUSEA AND VOMITING THAT IS NOT CONTROLLED WITH YOUR NAUSEA MEDICATION  *UNUSUAL SHORTNESS OF BREATH  *UNUSUAL BRUISING OR BLEEDING  TENDERNESS IN MOUTH AND THROAT WITH OR WITHOUT PRESENCE OF ULCERS  *URINARY PROBLEMS  *BOWEL PROBLEMS  UNUSUAL RASH Items with * indicate a potential emergency and should be followed up as soon as possible.  Feel free to call the clinic you have any questions or concerns. The clinic phone number is [phone].    

## 2013-02-19 LAB — SPEP & IFE WITH QIG
Beta 2: 3.7 % (ref 3.2–6.5)
Beta Globulin: 4.9 % (ref 4.7–7.2)
Gamma Globulin: 18.2 % (ref 11.1–18.8)
IgA: <6 mg/dL — ABNORMAL LOW (ref 69–380)
IgM, Serum: <5 mg/dL — ABNORMAL LOW (ref 52–322)

## 2013-02-24 ENCOUNTER — Ambulatory Visit (HOSPITAL_BASED_OUTPATIENT_CLINIC_OR_DEPARTMENT_OTHER): Payer: Medicaid Other

## 2013-02-24 ENCOUNTER — Other Ambulatory Visit (HOSPITAL_BASED_OUTPATIENT_CLINIC_OR_DEPARTMENT_OTHER): Payer: Medicaid Other | Admitting: Lab

## 2013-02-24 DIAGNOSIS — Z5112 Encounter for antineoplastic immunotherapy: Secondary | ICD-10-CM

## 2013-02-24 DIAGNOSIS — C9000 Multiple myeloma not having achieved remission: Secondary | ICD-10-CM

## 2013-02-24 DIAGNOSIS — C9002 Multiple myeloma in relapse: Secondary | ICD-10-CM

## 2013-02-24 LAB — CBC WITH DIFFERENTIAL/PLATELET
Basophils Absolute: 0 10*3/uL (ref 0.0–0.1)
Eosinophils Absolute: 0 10*3/uL (ref 0.0–0.5)
HGB: 9.8 g/dL — ABNORMAL LOW (ref 11.6–15.9)
MCV: 91.2 fL (ref 79.5–101.0)
MONO#: 0.5 10*3/uL (ref 0.1–0.9)
MONO%: 13 % (ref 0.0–14.0)
NEUT#: 1.4 10*3/uL — ABNORMAL LOW (ref 1.5–6.5)
Platelets: 129 10*3/uL — ABNORMAL LOW (ref 145–400)
RDW: 16.3 % — ABNORMAL HIGH (ref 11.2–14.5)
WBC: 3.5 10*3/uL — ABNORMAL LOW (ref 3.9–10.3)

## 2013-02-24 LAB — COMPREHENSIVE METABOLIC PANEL (CC13)
BUN: 9.5 mg/dL (ref 7.0–26.0)
CO2: 18 mEq/L — ABNORMAL LOW (ref 22–29)
Calcium: 7.4 mg/dL — ABNORMAL LOW (ref 8.4–10.4)
Creatinine: 0.8 mg/dL (ref 0.6–1.1)
Glucose: 69 mg/dl — ABNORMAL LOW (ref 70–140)
Total Bilirubin: 0.67 mg/dL (ref 0.20–1.20)

## 2013-02-24 MED ORDER — DEXTROSE 5 % IV SOLN
20.0000 mg/m2 | Freq: Once | INTRAVENOUS | Status: AC
  Administered 2013-02-24: 46 mg via INTRAVENOUS
  Filled 2013-02-24: qty 23

## 2013-02-24 MED ORDER — SODIUM CHLORIDE 0.9 % IV SOLN: Freq: Once | INTRAVENOUS | Status: DC

## 2013-02-24 MED ORDER — HEPARIN SOD (PORK) LOCK FLUSH 100 UNIT/ML IV SOLN
500.0000 [IU] | Freq: Once | INTRAVENOUS | Status: AC | PRN
  Administered 2013-02-24: 500 [IU]
  Filled 2013-02-24: qty 5

## 2013-02-24 MED ORDER — SODIUM CHLORIDE 0.9 % IV SOLN
Freq: Once | INTRAVENOUS | Status: AC
  Administered 2013-02-24: 15:00:00 via INTRAVENOUS

## 2013-02-24 MED ORDER — SODIUM CHLORIDE 0.9 % IJ SOLN
10.0000 mL | INTRAMUSCULAR | Status: DC | PRN
  Administered 2013-02-24: 10 mL
  Filled 2013-02-24: qty 10

## 2013-02-24 MED ORDER — DEXAMETHASONE SODIUM PHOSPHATE 10 MG/ML IJ SOLN
10.0000 mg | Freq: Once | INTRAMUSCULAR | Status: AC
  Administered 2013-02-24: 10 mg via INTRAVENOUS

## 2013-02-24 MED ORDER — ONDANSETRON 8 MG/50ML IVPB (CHCC)
8.0000 mg | Freq: Once | INTRAVENOUS | Status: AC
  Administered 2013-02-24: 8 mg via INTRAVENOUS

## 2013-02-24 NOTE — Progress Notes (Signed)
OK to treat per Dr. Clelia Croft with ANC 1.4

## 2013-02-24 NOTE — Patient Instructions (Signed)
[facility] Discharge Instructions for Patients Receiving Chemotherapy  Today you received the following chemotherapy agents Kyprolis To help prevent nausea and vomiting after your treatment, we encourage you to take your nausea medication as prescribed.  If you develop nausea and vomiting that is not controlled by your nausea medication, call the clinic.   BELOW ARE SYMPTOMS THAT SHOULD BE REPORTED IMMEDIATELY:  *FEVER GREATER THAN 100.5 F  *CHILLS WITH OR WITHOUT FEVER  NAUSEA AND VOMITING THAT IS NOT CONTROLLED WITH YOUR NAUSEA MEDICATION  *UNUSUAL SHORTNESS OF BREATH  *UNUSUAL BRUISING OR BLEEDING  TENDERNESS IN MOUTH AND THROAT WITH OR WITHOUT PRESENCE OF ULCERS  *URINARY PROBLEMS  *BOWEL PROBLEMS  UNUSUAL RASH Items with * indicate a potential emergency and should be followed up as soon as possible.  Feel free to call the clinic you have any questions or concerns. The clinic phone number is [phone].    

## 2013-02-25 ENCOUNTER — Ambulatory Visit (HOSPITAL_BASED_OUTPATIENT_CLINIC_OR_DEPARTMENT_OTHER): Payer: Medicaid Other

## 2013-02-25 ENCOUNTER — Telehealth: Payer: Self-pay

## 2013-02-25 VITALS — BP 125/56 | HR 72 | Temp 97.5°F | Resp 20

## 2013-02-25 DIAGNOSIS — C9002 Multiple myeloma in relapse: Secondary | ICD-10-CM

## 2013-02-25 DIAGNOSIS — C9000 Multiple myeloma not having achieved remission: Secondary | ICD-10-CM

## 2013-02-25 DIAGNOSIS — Z5112 Encounter for antineoplastic immunotherapy: Secondary | ICD-10-CM

## 2013-02-25 MED ORDER — CIPROFLOXACIN HCL 500 MG PO TABS: 500.0000 mg | ORAL_TABLET | Freq: Two times a day (BID) | ORAL | Status: DC

## 2013-02-25 MED ORDER — DEXTROSE 5 % IV SOLN
20.0000 mg/m2 | Freq: Once | INTRAVENOUS | Status: AC
  Administered 2013-02-25: 46 mg via INTRAVENOUS
  Filled 2013-02-25: qty 23

## 2013-02-25 MED ORDER — SODIUM CHLORIDE 0.9 % IV SOLN
Freq: Once | INTRAVENOUS | Status: AC
  Administered 2013-02-25: 16:00:00 via INTRAVENOUS

## 2013-02-25 MED ORDER — HEPARIN SOD (PORK) LOCK FLUSH 100 UNIT/ML IV SOLN
500.0000 [IU] | Freq: Once | INTRAVENOUS | Status: AC | PRN
  Administered 2013-02-25: 500 [IU]
  Filled 2013-02-25: qty 5

## 2013-02-25 MED ORDER — ONDANSETRON 8 MG/50ML IVPB (CHCC)
8.0000 mg | Freq: Once | INTRAVENOUS | Status: AC
  Administered 2013-02-25: 8 mg via INTRAVENOUS

## 2013-02-25 MED ORDER — SODIUM CHLORIDE 0.9 % IV SOLN: Freq: Once | INTRAVENOUS | Status: DC

## 2013-02-25 MED ORDER — SODIUM CHLORIDE 0.9 % IJ SOLN
10.0000 mL | INTRAMUSCULAR | Status: DC | PRN
  Administered 2013-02-25: 10 mL
  Filled 2013-02-25: qty 10

## 2013-02-25 MED ORDER — DEXAMETHASONE SODIUM PHOSPHATE 10 MG/ML IJ SOLN
10.0000 mg | Freq: Once | INTRAMUSCULAR | Status: AC
  Administered 2013-02-25: 10 mg via INTRAVENOUS

## 2013-02-25 NOTE — Telephone Encounter (Signed)
Pt. Cc of running nose , scratchy throat, and sinus pressure while in infusion today. Dr. Clelia Croft ordered Cipro 500 mg bid x 10 days.  Prescription e-scribed to pharmacy. Attempted to call patient at home and cell phone as she had been discharged from the infusion area.

## 2013-02-25 NOTE — Patient Instructions (Addendum)
[facility] Discharge Instructions for Patients Receiving Chemotherapy  Today you received the following chemotherapy agents Kyprolis To help prevent nausea and vomiting after your treatment, we encourage you to take your nausea medication as prescribed.  If you develop nausea and vomiting that is not controlled by your nausea medication, call the clinic.   BELOW ARE SYMPTOMS THAT SHOULD BE REPORTED IMMEDIATELY:  *FEVER GREATER THAN 100.5 F  *CHILLS WITH OR WITHOUT FEVER  NAUSEA AND VOMITING THAT IS NOT CONTROLLED WITH YOUR NAUSEA MEDICATION  *UNUSUAL SHORTNESS OF BREATH  *UNUSUAL BRUISING OR BLEEDING  TENDERNESS IN MOUTH AND THROAT WITH OR WITHOUT PRESENCE OF ULCERS  *URINARY PROBLEMS  *BOWEL PROBLEMS  UNUSUAL RASH Items with * indicate a potential emergency and should be followed up as soon as possible.  Feel free to call the clinic you have any questions or concerns. The clinic phone number is [phone].    

## 2013-03-03 ENCOUNTER — Other Ambulatory Visit (HOSPITAL_BASED_OUTPATIENT_CLINIC_OR_DEPARTMENT_OTHER): Payer: Medicaid Other | Admitting: Lab

## 2013-03-03 ENCOUNTER — Ambulatory Visit (HOSPITAL_BASED_OUTPATIENT_CLINIC_OR_DEPARTMENT_OTHER): Payer: Medicaid Other

## 2013-03-03 DIAGNOSIS — Z5112 Encounter for antineoplastic immunotherapy: Secondary | ICD-10-CM

## 2013-03-03 DIAGNOSIS — C9000 Multiple myeloma not having achieved remission: Secondary | ICD-10-CM

## 2013-03-03 DIAGNOSIS — K436 Other and unspecified ventral hernia with obstruction, without gangrene: Secondary | ICD-10-CM

## 2013-03-03 DIAGNOSIS — L02211 Cutaneous abscess of abdominal wall: Secondary | ICD-10-CM

## 2013-03-03 DIAGNOSIS — K430 Incisional hernia with obstruction, without gangrene: Secondary | ICD-10-CM

## 2013-03-03 DIAGNOSIS — C9002 Multiple myeloma in relapse: Secondary | ICD-10-CM

## 2013-03-03 LAB — CBC WITH DIFFERENTIAL/PLATELET
BASO%: 0.7 % (ref 0.0–2.0)
Basophils Absolute: 0 10*3/uL (ref 0.0–0.1)
EOS%: 0.3 % (ref 0.0–7.0)
HCT: 35 % (ref 34.8–46.6)
HGB: 11.3 g/dL — ABNORMAL LOW (ref 11.6–15.9)
LYMPH%: 29.2 % (ref 14.0–49.7)
MCH: 30.3 pg (ref 25.1–34.0)
MCHC: 32.4 g/dL (ref 31.5–36.0)
MCV: 93.7 fL (ref 79.5–101.0)
NEUT%: 56.9 % (ref 38.4–76.8)
Platelets: 134 10*3/uL — ABNORMAL LOW (ref 145–400)

## 2013-03-03 LAB — COMPREHENSIVE METABOLIC PANEL (CC13)
ALT: 15 U/L (ref 0–55)
AST: 21 U/L (ref 5–34)
BUN: 8.9 mg/dL (ref 7.0–26.0)
CO2: 24 mEq/L (ref 22–29)
Calcium: 8.6 mg/dL (ref 8.4–10.4)
Chloride: 114 mEq/L — ABNORMAL HIGH (ref 98–109)
Creatinine: 1 mg/dL (ref 0.6–1.1)
Total Bilirubin: 0.85 mg/dL (ref 0.20–1.20)

## 2013-03-03 MED ORDER — OXYCODONE-ACETAMINOPHEN 5-325 MG PO TABS: 1.0000 | ORAL_TABLET | Freq: Once | ORAL | Status: DC

## 2013-03-03 MED ORDER — DEXTROSE 5 % IV SOLN
20.0000 mg/m2 | Freq: Once | INTRAVENOUS | Status: AC
  Administered 2013-03-03: 46 mg via INTRAVENOUS
  Filled 2013-03-03: qty 23

## 2013-03-03 MED ORDER — HEPARIN SOD (PORK) LOCK FLUSH 100 UNIT/ML IV SOLN
500.0000 [IU] | Freq: Once | INTRAVENOUS | Status: AC | PRN
  Administered 2013-03-03: 500 [IU]
  Filled 2013-03-03: qty 5

## 2013-03-03 MED ORDER — ONDANSETRON 8 MG/NS 50 ML IVPB
INTRAVENOUS | Status: AC
  Filled 2013-03-03: qty 8

## 2013-03-03 MED ORDER — ONDANSETRON 8 MG/50ML IVPB (CHCC)
8.0000 mg | Freq: Once | INTRAVENOUS | Status: AC
  Administered 2013-03-03: 8 mg via INTRAVENOUS

## 2013-03-03 MED ORDER — DEXAMETHASONE SODIUM PHOSPHATE 10 MG/ML IJ SOLN
INTRAMUSCULAR | Status: AC
  Filled 2013-03-03: qty 1

## 2013-03-03 MED ORDER — SODIUM CHLORIDE 0.9 % IV SOLN: Freq: Once | INTRAVENOUS | Status: DC

## 2013-03-03 MED ORDER — SODIUM CHLORIDE 0.9 % IV SOLN
Freq: Once | INTRAVENOUS | Status: AC
  Administered 2013-03-03: 16:00:00 via INTRAVENOUS

## 2013-03-03 MED ORDER — DEXAMETHASONE SODIUM PHOSPHATE 10 MG/ML IJ SOLN
10.0000 mg | Freq: Once | INTRAMUSCULAR | Status: AC
  Administered 2013-03-03: 10 mg via INTRAVENOUS

## 2013-03-03 MED ORDER — ZOLEDRONIC ACID 4 MG/100ML IV SOLN
4.0000 mg | Freq: Once | INTRAVENOUS | Status: AC
  Administered 2013-03-03: 4 mg via INTRAVENOUS
  Filled 2013-03-03: qty 100

## 2013-03-03 MED ORDER — SODIUM CHLORIDE 0.9 % IJ SOLN
10.0000 mL | INTRAMUSCULAR | Status: DC | PRN
  Administered 2013-03-03: 10 mL
  Filled 2013-03-03: qty 10

## 2013-03-03 NOTE — Patient Instructions (Signed)
[facility] Discharge Instructions for Patients Receiving Chemotherapy  Today you received the following chemotherapy agents: Kyprolis and Zometa  To help prevent nausea and vomiting after your treatment, we encourage you to take your nausea medications as needed:  Zofran 8 mg every 8 hours as needed   Compazine 10 mg every 6 hours as needed for nausea   If you develop nausea and vomiting that is not controlled by your nausea medication, call the clinic.   BELOW ARE SYMPTOMS THAT SHOULD BE REPORTED IMMEDIATELY:  *FEVER GREATER THAN 100.5 F  *CHILLS WITH OR WITHOUT FEVER  NAUSEA AND VOMITING THAT IS NOT CONTROLLED WITH YOUR NAUSEA MEDICATION  *UNUSUAL SHORTNESS OF BREATH  *UNUSUAL BRUISING OR BLEEDING  TENDERNESS IN MOUTH AND THROAT WITH OR WITHOUT PRESENCE OF ULCERS  *URINARY PROBLEMS  *BOWEL PROBLEMS  UNUSUAL RASH Items with * indicate a potential emergency and should be followed up as soon as possible.  Feel free to call the clinic you have any questions or concerns. The clinic phone number is [phone].  It has been a pleasure to serve you today.

## 2013-03-04 ENCOUNTER — Ambulatory Visit (HOSPITAL_BASED_OUTPATIENT_CLINIC_OR_DEPARTMENT_OTHER): Payer: Medicaid Other

## 2013-03-04 ENCOUNTER — Other Ambulatory Visit: Payer: Medicaid Other | Admitting: *Deleted

## 2013-03-04 DIAGNOSIS — Z5112 Encounter for antineoplastic immunotherapy: Secondary | ICD-10-CM

## 2013-03-04 DIAGNOSIS — C9000 Multiple myeloma not having achieved remission: Secondary | ICD-10-CM

## 2013-03-04 DIAGNOSIS — C9002 Multiple myeloma in relapse: Secondary | ICD-10-CM

## 2013-03-04 MED ORDER — DEXAMETHASONE SODIUM PHOSPHATE 10 MG/ML IJ SOLN
INTRAMUSCULAR | Status: AC
  Administered 2013-03-04: 10 mg
  Filled 2013-03-04: qty 1

## 2013-03-04 MED ORDER — DEXTROSE 5 % IV SOLN
20.0000 mg/m2 | Freq: Once | INTRAVENOUS | Status: AC
  Administered 2013-03-04: 46 mg via INTRAVENOUS
  Filled 2013-03-04: qty 23

## 2013-03-04 MED ORDER — DEXAMETHASONE SODIUM PHOSPHATE 10 MG/ML IJ SOLN
10.0000 mg | Freq: Once | INTRAMUSCULAR | Status: AC
  Administered 2013-03-04: 10 mg via INTRAVENOUS

## 2013-03-04 MED ORDER — ONDANSETRON 8 MG/50ML IVPB (CHCC)
8.0000 mg | Freq: Once | INTRAVENOUS | Status: AC
  Administered 2013-03-04: 8 mg via INTRAVENOUS

## 2013-03-04 MED ORDER — SODIUM CHLORIDE 0.9 % IJ SOLN
10.0000 mL | INTRAMUSCULAR | Status: DC | PRN
  Administered 2013-03-04: 10 mL
  Filled 2013-03-04: qty 10

## 2013-03-04 MED ORDER — HEPARIN SOD (PORK) LOCK FLUSH 100 UNIT/ML IV SOLN
500.0000 [IU] | Freq: Once | INTRAVENOUS | Status: AC | PRN
  Administered 2013-03-04: 500 [IU]
  Filled 2013-03-04: qty 5

## 2013-03-04 MED ORDER — SODIUM CHLORIDE 0.9 % IV SOLN: Freq: Once | INTRAVENOUS | Status: DC

## 2013-03-04 MED ORDER — ONDANSETRON 8 MG/NS 50 ML IVPB
INTRAVENOUS | Status: AC
  Filled 2013-03-04: qty 8

## 2013-03-17 ENCOUNTER — Ambulatory Visit (HOSPITAL_BASED_OUTPATIENT_CLINIC_OR_DEPARTMENT_OTHER): Payer: Medicaid Other

## 2013-03-17 ENCOUNTER — Other Ambulatory Visit: Payer: Self-pay | Admitting: Oncology

## 2013-03-17 ENCOUNTER — Telehealth: Payer: Self-pay | Admitting: Oncology

## 2013-03-17 ENCOUNTER — Ambulatory Visit (HOSPITAL_BASED_OUTPATIENT_CLINIC_OR_DEPARTMENT_OTHER): Payer: Medicaid Other | Admitting: Oncology

## 2013-03-17 ENCOUNTER — Telehealth: Payer: Self-pay | Admitting: *Deleted

## 2013-03-17 ENCOUNTER — Other Ambulatory Visit (HOSPITAL_BASED_OUTPATIENT_CLINIC_OR_DEPARTMENT_OTHER): Payer: Medicaid Other | Admitting: Lab

## 2013-03-17 VITALS — BP 143/56 | HR 91 | Temp 98.0°F | Resp 20 | Ht 65.0 in

## 2013-03-17 DIAGNOSIS — C9000 Multiple myeloma not having achieved remission: Secondary | ICD-10-CM

## 2013-03-17 DIAGNOSIS — Z23 Encounter for immunization: Secondary | ICD-10-CM

## 2013-03-17 DIAGNOSIS — I10 Essential (primary) hypertension: Secondary | ICD-10-CM

## 2013-03-17 DIAGNOSIS — Z5112 Encounter for antineoplastic immunotherapy: Secondary | ICD-10-CM

## 2013-03-17 DIAGNOSIS — C9002 Multiple myeloma in relapse: Secondary | ICD-10-CM

## 2013-03-17 DIAGNOSIS — M25559 Pain in unspecified hip: Secondary | ICD-10-CM

## 2013-03-17 DIAGNOSIS — I82409 Acute embolism and thrombosis of unspecified deep veins of unspecified lower extremity: Secondary | ICD-10-CM

## 2013-03-17 LAB — CBC WITH DIFFERENTIAL/PLATELET
BASO%: 0.9 % (ref 0.0–2.0)
EOS%: 0.3 % (ref 0.0–7.0)
MCH: 30.4 pg (ref 25.1–34.0)
MCHC: 32.2 g/dL (ref 31.5–36.0)
RBC: 3.98 10*6/uL (ref 3.70–5.45)
RDW: 15.2 % — ABNORMAL HIGH (ref 11.2–14.5)
lymph#: 1.1 10*3/uL (ref 0.9–3.3)

## 2013-03-17 LAB — COMPREHENSIVE METABOLIC PANEL (CC13)
ALT: 15 U/L (ref 0–55)
AST: 28 U/L (ref 5–34)
Albumin: 3.1 g/dL — ABNORMAL LOW (ref 3.5–5.0)
Calcium: 9.6 mg/dL (ref 8.4–10.4)
Chloride: 110 mEq/L — ABNORMAL HIGH (ref 98–109)
Creatinine: 1.1 mg/dL (ref 0.6–1.1)
Potassium: 4.1 mEq/L (ref 3.5–5.1)
Sodium: 145 mEq/L (ref 136–145)

## 2013-03-17 MED ORDER — SODIUM CHLORIDE 0.9 % IJ SOLN
10.0000 mL | INTRAMUSCULAR | Status: DC | PRN
  Administered 2013-03-17: 10 mL
  Filled 2013-03-17: qty 10

## 2013-03-17 MED ORDER — DEXAMETHASONE SODIUM PHOSPHATE 10 MG/ML IJ SOLN
INTRAMUSCULAR | Status: AC
  Filled 2013-03-17: qty 1

## 2013-03-17 MED ORDER — SODIUM CHLORIDE 0.9 % IV SOLN: Freq: Once | INTRAVENOUS | Status: DC

## 2013-03-17 MED ORDER — SODIUM CHLORIDE 0.9 % IV SOLN
Freq: Once | INTRAVENOUS | Status: AC
  Administered 2013-03-17: 15:00:00 via INTRAVENOUS

## 2013-03-17 MED ORDER — HEPARIN SOD (PORK) LOCK FLUSH 100 UNIT/ML IV SOLN
500.0000 [IU] | Freq: Once | INTRAVENOUS | Status: AC | PRN
  Administered 2013-03-17: 500 [IU]
  Filled 2013-03-17: qty 5

## 2013-03-17 MED ORDER — ONDANSETRON 8 MG/NS 50 ML IVPB
INTRAVENOUS | Status: AC
  Filled 2013-03-17: qty 8

## 2013-03-17 MED ORDER — DEXAMETHASONE SODIUM PHOSPHATE 10 MG/ML IJ SOLN
10.0000 mg | Freq: Once | INTRAMUSCULAR | Status: AC
  Administered 2013-03-17: 10 mg via INTRAVENOUS

## 2013-03-17 MED ORDER — INFLUENZA VAC SPLIT QUAD 0.5 ML IM SUSP
0.5000 mL | Freq: Once | INTRAMUSCULAR | Status: AC
  Administered 2013-03-17: 0.5 mL via INTRAMUSCULAR
  Filled 2013-03-17: qty 0.5

## 2013-03-17 MED ORDER — DEXTROSE 5 % IV SOLN
20.0000 mg/m2 | Freq: Once | INTRAVENOUS | Status: AC
  Administered 2013-03-17: 46 mg via INTRAVENOUS
  Filled 2013-03-17: qty 23

## 2013-03-17 MED ORDER — FENTANYL 100 MCG/HR TD PT72: 1.0000 | MEDICATED_PATCH | TRANSDERMAL | Status: DC

## 2013-03-17 MED ORDER — ONDANSETRON 8 MG/50ML IVPB (CHCC)
8.0000 mg | Freq: Once | INTRAVENOUS | Status: AC
  Administered 2013-03-17: 8 mg via INTRAVENOUS

## 2013-03-17 NOTE — Progress Notes (Signed)
Hematology and Oncology Follow Up Visit  [name] [id number] 02-18-1951 62 y.o. 03/17/2013 3:09 PM      Principle Diagnosis:  This is a 62 year old female with the following issues:   1. 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  She had a free kappa light chain around 1500. 2. Diagnosis of deep vein thrombosis in July 2011.   Prior Therapy:    1. Initially treated with melphalan and prednisone.  She had a partial response with free light chains down as low as 19, M spike down to 0.52 g/dL.  2. Patient treated with Revlimid at 25 mg 3 weeks on and 1 week off between November 2009 until November 2010.  She had a partial response, light chains down to 33 and M spike was not detected.   3. She was treated on maintenance Revlimid 15 mg to maintain her partial response. 4. She was on Zometa intermittently, on hold know. 5. Currently anticoagulated with Coumadin 7.5 mg since July 2011. 6. She received Velcade 1.3 mg/sq m for a total of 2.9 mg weekly, which started on 02/21/2011 with weekly dexamethasone .  Treatment has been on hold due to recurrent hospitalization for wound infection. Treatment restarted in in 06/2011 till 09/2011. 7. Kyprolis started on 02/04/12. She completed 6 months of therapy on 07/08/12  Current therapy: Resumed Kyprolis on 11/25/12. She is here for cycle 11 day 1.   Interim History:  [name] presents today for routine followup prior to her next cycle of chemotherapy.This is a pleasant 62 year old female with a few comorbid conditions as outlined above. She has been well since her last visit. Her pain is better with the increase in her Fentanyl patch to 100 mcg/hr. No neurological symptoms. She is able to ambulate. She less oxycodone for breakthrough pain now. She is still ambulating without problems.She is tolerating chemotherapy well. She report faint sensory neuropathy  without any interference with her function.    Medications: I have reviewed the patient's current medications.  Allergies:  Allergies  Allergen Reactions  . Ibuprofen Other (See Comments)    Patient does not remember.   . Morphine And Related Other (See Comments)    Patient does not remember reaction, believes it caused raised marks on skin.  . Other Rash    Tegaderm dressing    Past Medical History, Surgical history, Social history, and Family History were reviewed and updated.  Review of Systems:  Remaining ROS negative.  Physical Exam: Blood pressure 143/56, pulse 91, temperature 98 F (36.7 C), temperature source Oral, resp. rate 20, height 5\' 5"  (1.651 m), weight 0 lb (0 kg). ECOG: 1 General appearance: alert Head: Normocephalic, without obvious abnormality, atraumatic Neck: no adenopathy, no carotid bruit, no JVD, supple, symmetrical, trachea midline and thyroid not enlarged, symmetric, no tenderness/mass/nodules Lymph nodes: Cervical, supraclavicular, and axillary nodes normal. Heart:regular rate and rhythm, S1, S2 normal, no murmur, click, rub or gallop Lung:chest clear, no wheezing, rales, normal symmetric air entry, Heart exam - S1, S2 normal, no murmur, no gallop, rate regular Abdomen: soft, non-tender, without masses or organomegaly. Wound appear have healed. EXT:no erythema, induration, or nodules. 2-3+ bilateral edema in the ankles/feet. Neurological: No deficits detected with limited exam.   Lab Results: Lab Results  Component Value Date   WBC 3.6* 03/17/2013   HGB 12.1 03/17/2013   HCT 37.5 03/17/2013   MCV 94.2 03/17/2013   PLT 172  03/17/2013     Chemistry      Component Value Date/Time   NA 145 03/03/2013 1450   NA 142 01/16/2013 1720   K 4.2 03/03/2013 1450   K 3.6 01/16/2013 1720   CL 110 01/16/2013 1720   CL 110* 12/02/2012 1444   CO2 24 03/03/2013 1450   CO2 26 01/16/2013 1720   BUN 8.9 03/03/2013 1450   BUN 9 01/16/2013 1720   CREATININE 1.0 03/03/2013  1450   CREATININE 0.94 01/16/2013 1720      Component Value Date/Time   CALCIUM 8.6 03/03/2013 1450   CALCIUM 9.6 01/16/2013 1720   ALKPHOS 57 03/03/2013 1450   ALKPHOS 57 02/11/2012 1445   AST 21 03/03/2013 1450   AST 15 02/11/2012 1445   ALT 15 03/03/2013 1450   ALT 12 02/11/2012 1445   BILITOT 0.85 03/03/2013 1450   BILITOT 0.5 02/11/2012 1445        Impression and Plan:  This is a 62 year old female with the following issues:  1. Light chain multiple myeloma. Recently completed Kyprolis with improvement in light chains. Kappa light chains were up to 1200 but dropped down to 593 after chemotherapy restart. She is clinically doing better on therapy. Recommend that she proceed with her kyprolis as scheduled. Recommend that she proceed with cycle 11 day 1 today without dose modification.  2. Hypercalcemia. Calcium is normal today. She will receive Zometa every 8 weeks. This will be repeated in 04/28/2013 3. Pain in her hips/legs: Better with Fentanyl to 100 mcg/hr.Anemia, multifactorial. Hemoglobin stable. 4. History of deep vein thrombosis. Previously anticoagulated with Coumadin. No new clots noted. No more anticoagulation at this time.  5. Hypertension seems to be under relatively good control.  6. LE edema. Resolved now  7. IV access: PAC in place.  8. Follow up. In 4 weeks as scheduled    [facility] 9/24/20143:09 PM

## 2013-03-17 NOTE — Telephone Encounter (Signed)
Per staff message and POF I have scheduled appts.  JMW  

## 2013-03-17 NOTE — Patient Instructions (Signed)
[facility] Discharge Instructions for Patients Receiving Chemotherapy  Today you received the following chemotherapy agents Kyprolis To help prevent nausea and vomiting after your treatment, we encourage you to take your nausea medication as prescribed.  If you develop nausea and vomiting that is not controlled by your nausea medication, call the clinic.   BELOW ARE SYMPTOMS THAT SHOULD BE REPORTED IMMEDIATELY:  *FEVER GREATER THAN 100.5 F  *CHILLS WITH OR WITHOUT FEVER  NAUSEA AND VOMITING THAT IS NOT CONTROLLED WITH YOUR NAUSEA MEDICATION  *UNUSUAL SHORTNESS OF BREATH  *UNUSUAL BRUISING OR BLEEDING  TENDERNESS IN MOUTH AND THROAT WITH OR WITHOUT PRESENCE OF ULCERS  *URINARY PROBLEMS  *BOWEL PROBLEMS  UNUSUAL RASH Items with * indicate a potential emergency and should be followed up as soon as possible.  Feel free to call the clinic you have any questions or concerns. The clinic phone number is [phone].    

## 2013-03-17 NOTE — Telephone Encounter (Signed)
gv and printed appt sched and avs for pt for SEpt and OCT...emailed MW to add tx. after 2.30pm

## 2013-03-18 ENCOUNTER — Ambulatory Visit (HOSPITAL_BASED_OUTPATIENT_CLINIC_OR_DEPARTMENT_OTHER): Payer: Medicaid Other

## 2013-03-18 VITALS — BP 110/74 | HR 74 | Temp 98.4°F | Resp 20

## 2013-03-18 DIAGNOSIS — Z5112 Encounter for antineoplastic immunotherapy: Secondary | ICD-10-CM

## 2013-03-18 DIAGNOSIS — C9000 Multiple myeloma not having achieved remission: Secondary | ICD-10-CM

## 2013-03-18 DIAGNOSIS — C9002 Multiple myeloma in relapse: Secondary | ICD-10-CM

## 2013-03-18 LAB — KAPPA/LAMBDA LIGHT CHAINS: Lambda Free Lght Chn: 0.03 mg/dL — ABNORMAL LOW (ref 0.57–2.63)

## 2013-03-18 MED ORDER — DEXTROSE 5 % IV SOLN
20.0000 mg/m2 | Freq: Once | INTRAVENOUS | Status: AC
  Administered 2013-03-18: 46 mg via INTRAVENOUS
  Filled 2013-03-18: qty 23

## 2013-03-18 MED ORDER — HEPARIN SOD (PORK) LOCK FLUSH 100 UNIT/ML IV SOLN
500.0000 [IU] | Freq: Once | INTRAVENOUS | Status: AC | PRN
  Administered 2013-03-18: 500 [IU]
  Filled 2013-03-18: qty 5

## 2013-03-18 MED ORDER — SODIUM CHLORIDE 0.9 % IV SOLN
Freq: Once | INTRAVENOUS | Status: AC
  Administered 2013-03-18: 16:00:00 via INTRAVENOUS

## 2013-03-18 MED ORDER — ONDANSETRON 8 MG/50ML IVPB (CHCC)
8.0000 mg | Freq: Once | INTRAVENOUS | Status: AC
  Administered 2013-03-18: 8 mg via INTRAVENOUS

## 2013-03-18 MED ORDER — DEXAMETHASONE SODIUM PHOSPHATE 10 MG/ML IJ SOLN
INTRAMUSCULAR | Status: AC
  Filled 2013-03-18: qty 1

## 2013-03-18 MED ORDER — SODIUM CHLORIDE 0.9 % IJ SOLN
10.0000 mL | INTRAMUSCULAR | Status: DC | PRN
  Administered 2013-03-18: 10 mL
  Filled 2013-03-18: qty 10

## 2013-03-18 MED ORDER — DEXAMETHASONE SODIUM PHOSPHATE 10 MG/ML IJ SOLN
10.0000 mg | Freq: Once | INTRAMUSCULAR | Status: AC
  Administered 2013-03-18: 10 mg via INTRAVENOUS

## 2013-03-18 MED ORDER — ONDANSETRON 8 MG/NS 50 ML IVPB
INTRAVENOUS | Status: AC
  Filled 2013-03-18: qty 8

## 2013-03-18 NOTE — Patient Instructions (Signed)
[facility] Discharge Instructions for Patients Receiving Chemotherapy  Today you received the following chemotherapy agents Kyprolis To help prevent nausea and vomiting after your treatment, we encourage you to take your nausea medication as prescribed.  If you develop nausea and vomiting that is not controlled by your nausea medication, call the clinic.   BELOW ARE SYMPTOMS THAT SHOULD BE REPORTED IMMEDIATELY:  *FEVER GREATER THAN 100.5 F  *CHILLS WITH OR WITHOUT FEVER  NAUSEA AND VOMITING THAT IS NOT CONTROLLED WITH YOUR NAUSEA MEDICATION  *UNUSUAL SHORTNESS OF BREATH  *UNUSUAL BRUISING OR BLEEDING  TENDERNESS IN MOUTH AND THROAT WITH OR WITHOUT PRESENCE OF ULCERS  *URINARY PROBLEMS  *BOWEL PROBLEMS  UNUSUAL RASH Items with * indicate a potential emergency and should be followed up as soon as possible.  Feel free to call the clinic you have any questions or concerns. The clinic phone number is [phone].    

## 2013-03-24 ENCOUNTER — Ambulatory Visit (HOSPITAL_BASED_OUTPATIENT_CLINIC_OR_DEPARTMENT_OTHER): Payer: Medicaid Other

## 2013-03-24 ENCOUNTER — Other Ambulatory Visit (HOSPITAL_BASED_OUTPATIENT_CLINIC_OR_DEPARTMENT_OTHER): Payer: Medicaid Other | Admitting: Lab

## 2013-03-24 VITALS — BP 123/65 | HR 96 | Temp 97.4°F | Resp 20

## 2013-03-24 DIAGNOSIS — C9002 Multiple myeloma in relapse: Secondary | ICD-10-CM

## 2013-03-24 DIAGNOSIS — C9000 Multiple myeloma not having achieved remission: Secondary | ICD-10-CM

## 2013-03-24 DIAGNOSIS — Z5112 Encounter for antineoplastic immunotherapy: Secondary | ICD-10-CM

## 2013-03-24 LAB — CBC WITH DIFFERENTIAL/PLATELET
BASO%: 0.4 % (ref 0.0–2.0)
EOS%: 0.4 % (ref 0.0–7.0)
HGB: 12.8 g/dL (ref 11.6–15.9)
LYMPH%: 40 % (ref 14.0–49.7)
MCH: 29.7 pg (ref 25.1–34.0)
MCHC: 32.8 g/dL (ref 31.5–36.0)
MCV: 90.5 fL (ref 79.5–101.0)
MONO%: 13.2 % (ref 0.0–14.0)
NEUT#: 2.2 10*3/uL (ref 1.5–6.5)
Platelets: 146 10*3/uL (ref 145–400)
RBC: 4.31 10*6/uL (ref 3.70–5.45)
RDW: 14.7 % — ABNORMAL HIGH (ref 11.2–14.5)
nRBC: 0 % (ref 0–0)

## 2013-03-24 LAB — COMPREHENSIVE METABOLIC PANEL (CC13)
ALT: 12 U/L (ref 0–55)
Alkaline Phosphatase: 56 U/L (ref 40–150)
CO2: 24 mEq/L (ref 22–29)
Creatinine: 1.2 mg/dL — ABNORMAL HIGH (ref 0.6–1.1)
Potassium: 4.2 mEq/L (ref 3.5–5.1)
Sodium: 143 mEq/L (ref 136–145)
Total Bilirubin: 0.7 mg/dL (ref 0.20–1.20)
Total Protein: 6.3 g/dL — ABNORMAL LOW (ref 6.4–8.3)

## 2013-03-24 MED ORDER — ONDANSETRON 8 MG/50ML IVPB (CHCC)
8.0000 mg | Freq: Once | INTRAVENOUS | Status: AC
  Administered 2013-03-24: 8 mg via INTRAVENOUS

## 2013-03-24 MED ORDER — SODIUM CHLORIDE 0.9 % IV SOLN
Freq: Once | INTRAVENOUS | Status: AC
  Administered 2013-03-24: 15:00:00 via INTRAVENOUS

## 2013-03-24 MED ORDER — ONDANSETRON 8 MG/NS 50 ML IVPB
INTRAVENOUS | Status: AC
  Filled 2013-03-24: qty 8

## 2013-03-24 MED ORDER — DEXAMETHASONE SODIUM PHOSPHATE 10 MG/ML IJ SOLN
INTRAMUSCULAR | Status: AC
  Filled 2013-03-24: qty 1

## 2013-03-24 MED ORDER — DEXAMETHASONE SODIUM PHOSPHATE 10 MG/ML IJ SOLN
10.0000 mg | Freq: Once | INTRAMUSCULAR | Status: AC
  Administered 2013-03-24: 10 mg via INTRAVENOUS

## 2013-03-24 MED ORDER — HEPARIN SOD (PORK) LOCK FLUSH 100 UNIT/ML IV SOLN
500.0000 [IU] | Freq: Once | INTRAVENOUS | Status: AC | PRN
  Administered 2013-03-24: 500 [IU]
  Filled 2013-03-24: qty 5

## 2013-03-24 MED ORDER — SODIUM CHLORIDE 0.9 % IJ SOLN
10.0000 mL | INTRAMUSCULAR | Status: DC | PRN
  Administered 2013-03-24: 10 mL
  Filled 2013-03-24: qty 10

## 2013-03-24 MED ORDER — DEXTROSE 5 % IV SOLN
20.0000 mg/m2 | Freq: Once | INTRAVENOUS | Status: AC
  Administered 2013-03-24: 46 mg via INTRAVENOUS
  Filled 2013-03-24: qty 23

## 2013-03-24 NOTE — Patient Instructions (Signed)
[facility] Discharge Instructions for Patients Receiving Chemotherapy  Today you received the following chemotherapy agents Kyprolis To help prevent nausea and vomiting after your treatment, we encourage you to take your nausea medication as prescribed.  If you develop nausea and vomiting that is not controlled by your nausea medication, call the clinic.   BELOW ARE SYMPTOMS THAT SHOULD BE REPORTED IMMEDIATELY:  *FEVER GREATER THAN 100.5 F  *CHILLS WITH OR WITHOUT FEVER  NAUSEA AND VOMITING THAT IS NOT CONTROLLED WITH YOUR NAUSEA MEDICATION  *UNUSUAL SHORTNESS OF BREATH  *UNUSUAL BRUISING OR BLEEDING  TENDERNESS IN MOUTH AND THROAT WITH OR WITHOUT PRESENCE OF ULCERS  *URINARY PROBLEMS  *BOWEL PROBLEMS  UNUSUAL RASH Items with * indicate a potential emergency and should be followed up as soon as possible.  Feel free to call the clinic you have any questions or concerns. The clinic phone number is [phone].    

## 2013-03-25 ENCOUNTER — Ambulatory Visit (HOSPITAL_BASED_OUTPATIENT_CLINIC_OR_DEPARTMENT_OTHER): Payer: Medicaid Other

## 2013-03-25 VITALS — BP 108/58 | HR 80 | Resp 20

## 2013-03-25 DIAGNOSIS — Z5112 Encounter for antineoplastic immunotherapy: Secondary | ICD-10-CM

## 2013-03-25 DIAGNOSIS — C9000 Multiple myeloma not having achieved remission: Secondary | ICD-10-CM

## 2013-03-25 DIAGNOSIS — C9002 Multiple myeloma in relapse: Secondary | ICD-10-CM

## 2013-03-25 MED ORDER — HEPARIN SOD (PORK) LOCK FLUSH 100 UNIT/ML IV SOLN
500.0000 [IU] | Freq: Once | INTRAVENOUS | Status: AC | PRN
  Administered 2013-03-25: 500 [IU]
  Filled 2013-03-25: qty 5

## 2013-03-25 MED ORDER — SODIUM CHLORIDE 0.9 % IJ SOLN
10.0000 mL | INTRAMUSCULAR | Status: DC | PRN
  Administered 2013-03-25: 10 mL
  Filled 2013-03-25: qty 10

## 2013-03-25 MED ORDER — DEXAMETHASONE SODIUM PHOSPHATE 10 MG/ML IJ SOLN
INTRAMUSCULAR | Status: AC
  Filled 2013-03-25: qty 1

## 2013-03-25 MED ORDER — ONDANSETRON 8 MG/NS 50 ML IVPB
INTRAVENOUS | Status: AC
  Filled 2013-03-25: qty 8

## 2013-03-25 MED ORDER — SODIUM CHLORIDE 0.9 % IV SOLN
Freq: Once | INTRAVENOUS | Status: AC
  Administered 2013-03-25: 15:00:00 via INTRAVENOUS

## 2013-03-25 MED ORDER — DEXTROSE 5 % IV SOLN
20.0000 mg/m2 | Freq: Once | INTRAVENOUS | Status: AC
  Administered 2013-03-25: 46 mg via INTRAVENOUS
  Filled 2013-03-25: qty 23

## 2013-03-25 MED ORDER — DEXAMETHASONE SODIUM PHOSPHATE 10 MG/ML IJ SOLN
10.0000 mg | Freq: Once | INTRAMUSCULAR | Status: AC
  Administered 2013-03-25: 10 mg via INTRAVENOUS

## 2013-03-25 MED ORDER — ONDANSETRON 8 MG/50ML IVPB (CHCC)
8.0000 mg | Freq: Once | INTRAVENOUS | Status: AC
  Administered 2013-03-25: 8 mg via INTRAVENOUS

## 2013-03-25 NOTE — Patient Instructions (Signed)
[facility] Discharge Instructions for Patients Receiving Chemotherapy  Today you received the following chemotherapy agents Kyprolis To help prevent nausea and vomiting after your treatment, we encourage you to take your nausea medication as prescribed.  If you develop nausea and vomiting that is not controlled by your nausea medication, call the clinic.   BELOW ARE SYMPTOMS THAT SHOULD BE REPORTED IMMEDIATELY:  *FEVER GREATER THAN 100.5 F  *CHILLS WITH OR WITHOUT FEVER  NAUSEA AND VOMITING THAT IS NOT CONTROLLED WITH YOUR NAUSEA MEDICATION  *UNUSUAL SHORTNESS OF BREATH  *UNUSUAL BRUISING OR BLEEDING  TENDERNESS IN MOUTH AND THROAT WITH OR WITHOUT PRESENCE OF ULCERS  *URINARY PROBLEMS  *BOWEL PROBLEMS  UNUSUAL RASH Items with * indicate a potential emergency and should be followed up as soon as possible.  Feel free to call the clinic you have any questions or concerns. The clinic phone number is [phone].    

## 2013-03-26 ENCOUNTER — Other Ambulatory Visit: Payer: Self-pay | Admitting: Oncology

## 2013-03-31 ENCOUNTER — Ambulatory Visit (HOSPITAL_BASED_OUTPATIENT_CLINIC_OR_DEPARTMENT_OTHER): Payer: Medicaid Other

## 2013-03-31 ENCOUNTER — Other Ambulatory Visit (HOSPITAL_BASED_OUTPATIENT_CLINIC_OR_DEPARTMENT_OTHER): Payer: Medicaid Other | Admitting: Lab

## 2013-03-31 ENCOUNTER — Other Ambulatory Visit: Payer: Self-pay | Admitting: *Deleted

## 2013-03-31 ENCOUNTER — Encounter (INDEPENDENT_AMBULATORY_CARE_PROVIDER_SITE_OTHER): Payer: Self-pay

## 2013-03-31 VITALS — BP 90/65 | HR 75 | Temp 97.6°F | Resp 18

## 2013-03-31 DIAGNOSIS — Z5112 Encounter for antineoplastic immunotherapy: Secondary | ICD-10-CM

## 2013-03-31 DIAGNOSIS — C9000 Multiple myeloma not having achieved remission: Secondary | ICD-10-CM

## 2013-03-31 DIAGNOSIS — C9002 Multiple myeloma in relapse: Secondary | ICD-10-CM

## 2013-03-31 LAB — CBC WITH DIFFERENTIAL/PLATELET
BASO%: 1 % (ref 0.0–2.0)
Basophils Absolute: 0 10e3/uL (ref 0.0–0.1)
EOS%: 0.4 % (ref 0.0–7.0)
Eosinophils Absolute: 0 10e3/uL (ref 0.0–0.5)
HCT: 39.4 % (ref 34.8–46.6)
HGB: 12.6 g/dL (ref 11.6–15.9)
LYMPH%: 31.5 % (ref 14.0–49.7)
MCH: 29.4 pg (ref 25.1–34.0)
MCHC: 32 g/dL (ref 31.5–36.0)
MCV: 92 fL (ref 79.5–101.0)
MONO#: 0.8 10e3/uL (ref 0.1–0.9)
MONO%: 16.8 % — ABNORMAL HIGH (ref 0.0–14.0)
NEUT#: 2.4 10e3/uL (ref 1.5–6.5)
NEUT%: 50.3 % (ref 38.4–76.8)
Platelets: 138 10e3/uL — ABNORMAL LOW (ref 145–400)
RBC: 4.28 10e6/uL (ref 3.70–5.45)
RDW: 14.8 % — ABNORMAL HIGH (ref 11.2–14.5)
WBC: 4.8 10e3/uL (ref 3.9–10.3)
lymph#: 1.5 10e3/uL (ref 0.9–3.3)

## 2013-03-31 LAB — COMPREHENSIVE METABOLIC PANEL (CC13)
ALT: 13 U/L (ref 0–55)
AST: 23 U/L (ref 5–34)
Albumin: 3.1 g/dL — ABNORMAL LOW (ref 3.5–5.0)
Alkaline Phosphatase: 58 U/L (ref 40–150)
Anion Gap: 9 meq/L (ref 3–11)
BUN: 12.7 mg/dL (ref 7.0–26.0)
CO2: 25 meq/L (ref 22–29)
Calcium: 9.1 mg/dL (ref 8.4–10.4)
Chloride: 108 meq/L (ref 98–109)
Creatinine: 1.2 mg/dL — ABNORMAL HIGH (ref 0.6–1.1)
Glucose: 92 mg/dL (ref 70–140)
Potassium: 4 meq/L (ref 3.5–5.1)
Sodium: 143 meq/L (ref 136–145)
Total Bilirubin: 0.67 mg/dL (ref 0.20–1.20)
Total Protein: 6.5 g/dL (ref 6.4–8.3)

## 2013-03-31 MED ORDER — DEXAMETHASONE SODIUM PHOSPHATE 10 MG/ML IJ SOLN
10.0000 mg | Freq: Once | INTRAMUSCULAR | Status: AC
  Administered 2013-03-31: 10 mg via INTRAVENOUS

## 2013-03-31 MED ORDER — HEPARIN SOD (PORK) LOCK FLUSH 100 UNIT/ML IV SOLN
500.0000 [IU] | Freq: Once | INTRAVENOUS | Status: AC | PRN
  Administered 2013-03-31: 500 [IU]
  Filled 2013-03-31: qty 5

## 2013-03-31 MED ORDER — DEXTROSE 5 % IV SOLN
20.0000 mg/m2 | Freq: Once | INTRAVENOUS | Status: AC
  Administered 2013-03-31: 46 mg via INTRAVENOUS
  Filled 2013-03-31: qty 23

## 2013-03-31 MED ORDER — SODIUM CHLORIDE 0.9 % IJ SOLN
10.0000 mL | INTRAMUSCULAR | Status: DC | PRN
  Administered 2013-03-31: 10 mL
  Filled 2013-03-31: qty 10

## 2013-03-31 MED ORDER — ONDANSETRON 8 MG/NS 50 ML IVPB
INTRAVENOUS | Status: AC
  Filled 2013-03-31: qty 8

## 2013-03-31 MED ORDER — ONDANSETRON 8 MG/50ML IVPB (CHCC)
8.0000 mg | Freq: Once | INTRAVENOUS | Status: AC
  Administered 2013-03-31: 8 mg via INTRAVENOUS

## 2013-03-31 MED ORDER — SODIUM CHLORIDE 0.9 % IV SOLN
Freq: Once | INTRAVENOUS | Status: AC
  Administered 2013-03-31: 15:00:00 via INTRAVENOUS

## 2013-03-31 MED ORDER — OXYCODONE HCL 5 MG PO TABS: 5.0000 mg | ORAL_TABLET | ORAL | Status: DC | PRN

## 2013-03-31 MED ORDER — DEXAMETHASONE SODIUM PHOSPHATE 10 MG/ML IJ SOLN
INTRAMUSCULAR | Status: AC
  Filled 2013-03-31: qty 1

## 2013-03-31 NOTE — Patient Instructions (Signed)
[facility] Discharge Instructions for Patients Receiving Chemotherapy  Today you received the following chemotherapy agents Kyprolis To help prevent nausea and vomiting after your treatment, we encourage you to take your nausea medication as prescribed.  If you develop nausea and vomiting that is not controlled by your nausea medication, call the clinic.   BELOW ARE SYMPTOMS THAT SHOULD BE REPORTED IMMEDIATELY:  *FEVER GREATER THAN 100.5 F  *CHILLS WITH OR WITHOUT FEVER  NAUSEA AND VOMITING THAT IS NOT CONTROLLED WITH YOUR NAUSEA MEDICATION  *UNUSUAL SHORTNESS OF BREATH  *UNUSUAL BRUISING OR BLEEDING  TENDERNESS IN MOUTH AND THROAT WITH OR WITHOUT PRESENCE OF ULCERS  *URINARY PROBLEMS  *BOWEL PROBLEMS  UNUSUAL RASH Items with * indicate a potential emergency and should be followed up as soon as possible.  Feel free to call the clinic you have any questions or concerns. The clinic phone number is [phone].    

## 2013-03-31 NOTE — Telephone Encounter (Signed)
Pt request refill on pain medcation. Reviewed with MD, Ok to refill. Rx given to Lafonda Mosses, Infusion RN to give to pt.

## 2013-04-01 ENCOUNTER — Encounter (INDEPENDENT_AMBULATORY_CARE_PROVIDER_SITE_OTHER): Payer: Self-pay

## 2013-04-01 ENCOUNTER — Ambulatory Visit (HOSPITAL_BASED_OUTPATIENT_CLINIC_OR_DEPARTMENT_OTHER): Payer: Medicaid Other

## 2013-04-01 VITALS — BP 109/45 | HR 83 | Temp 97.9°F

## 2013-04-01 DIAGNOSIS — C9002 Multiple myeloma in relapse: Secondary | ICD-10-CM

## 2013-04-01 DIAGNOSIS — Z5112 Encounter for antineoplastic immunotherapy: Secondary | ICD-10-CM

## 2013-04-01 DIAGNOSIS — C9000 Multiple myeloma not having achieved remission: Secondary | ICD-10-CM

## 2013-04-01 MED ORDER — SODIUM CHLORIDE 0.9 % IV SOLN
Freq: Once | INTRAVENOUS | Status: AC
  Administered 2013-04-01: 16:00:00 via INTRAVENOUS

## 2013-04-01 MED ORDER — DEXAMETHASONE SODIUM PHOSPHATE 10 MG/ML IJ SOLN
INTRAMUSCULAR | Status: AC
  Filled 2013-04-01: qty 1

## 2013-04-01 MED ORDER — SODIUM CHLORIDE 0.9 % IJ SOLN
10.0000 mL | INTRAMUSCULAR | Status: DC | PRN
  Administered 2013-04-01: 10 mL
  Filled 2013-04-01: qty 10

## 2013-04-01 MED ORDER — CARFILZOMIB CHEMO INJECTION 60 MG
20.0000 mg/m2 | Freq: Once | INTRAVENOUS | Status: AC
  Administered 2013-04-01: 46 mg via INTRAVENOUS
  Filled 2013-04-01: qty 23

## 2013-04-01 MED ORDER — ONDANSETRON 8 MG/50ML IVPB (CHCC)
8.0000 mg | Freq: Once | INTRAVENOUS | Status: AC
  Administered 2013-04-01: 8 mg via INTRAVENOUS

## 2013-04-01 MED ORDER — DEXAMETHASONE SODIUM PHOSPHATE 10 MG/ML IJ SOLN
10.0000 mg | Freq: Once | INTRAMUSCULAR | Status: AC
  Administered 2013-04-01: 10 mg via INTRAVENOUS

## 2013-04-01 MED ORDER — HEPARIN SOD (PORK) LOCK FLUSH 100 UNIT/ML IV SOLN
500.0000 [IU] | Freq: Once | INTRAVENOUS | Status: AC | PRN
  Administered 2013-04-01: 500 [IU]
  Filled 2013-04-01: qty 5

## 2013-04-01 NOTE — Patient Instructions (Signed)
[facility] Discharge Instructions for Patients Receiving Chemotherapy  Today you received the following chemotherapy agents kyprolis.  To help prevent nausea and vomiting after your treatment, we encourage you to take your nausea medication zofran.     If you develop nausea and vomiting that is not controlled by your nausea medication, call the clinic.   BELOW ARE SYMPTOMS THAT SHOULD BE REPORTED IMMEDIATELY:  *FEVER GREATER THAN 100.5 F  *CHILLS WITH OR WITHOUT FEVER  NAUSEA AND VOMITING THAT IS NOT CONTROLLED WITH YOUR NAUSEA MEDICATION  *UNUSUAL SHORTNESS OF BREATH  *UNUSUAL BRUISING OR BLEEDING  TENDERNESS IN MOUTH AND THROAT WITH OR WITHOUT PRESENCE OF ULCERS  *URINARY PROBLEMS  *BOWEL PROBLEMS  UNUSUAL RASH Items with * indicate a potential emergency and should be followed up as soon as possible.  Feel free to call the clinic you have any questions or concerns. The clinic phone number is [phone].    

## 2013-04-14 ENCOUNTER — Other Ambulatory Visit (HOSPITAL_BASED_OUTPATIENT_CLINIC_OR_DEPARTMENT_OTHER): Payer: Medicaid Other | Admitting: Lab

## 2013-04-14 ENCOUNTER — Ambulatory Visit (HOSPITAL_BASED_OUTPATIENT_CLINIC_OR_DEPARTMENT_OTHER): Payer: Medicaid Other | Admitting: Oncology

## 2013-04-14 ENCOUNTER — Other Ambulatory Visit: Payer: Self-pay | Admitting: *Deleted

## 2013-04-14 ENCOUNTER — Encounter: Payer: Self-pay | Admitting: *Deleted

## 2013-04-14 ENCOUNTER — Ambulatory Visit (HOSPITAL_BASED_OUTPATIENT_CLINIC_OR_DEPARTMENT_OTHER): Payer: Medicaid Other

## 2013-04-14 DIAGNOSIS — D649 Anemia, unspecified: Secondary | ICD-10-CM

## 2013-04-14 DIAGNOSIS — I10 Essential (primary) hypertension: Secondary | ICD-10-CM

## 2013-04-14 DIAGNOSIS — C9000 Multiple myeloma not having achieved remission: Secondary | ICD-10-CM

## 2013-04-14 DIAGNOSIS — Z5112 Encounter for antineoplastic immunotherapy: Secondary | ICD-10-CM

## 2013-04-14 DIAGNOSIS — Z86718 Personal history of other venous thrombosis and embolism: Secondary | ICD-10-CM

## 2013-04-14 LAB — CBC WITH DIFFERENTIAL/PLATELET
Basophils Absolute: 0.1 10*3/uL (ref 0.0–0.1)
EOS%: 0.7 % (ref 0.0–7.0)
Eosinophils Absolute: 0 10*3/uL (ref 0.0–0.5)
HGB: 12.3 g/dL (ref 11.6–15.9)
MCH: 29.3 pg (ref 25.1–34.0)
MCV: 91.4 fL (ref 79.5–101.0)
NEUT#: 1.4 10*3/uL — ABNORMAL LOW (ref 1.5–6.5)
RDW: 14.8 % — ABNORMAL HIGH (ref 11.2–14.5)
lymph#: 1.2 10*3/uL (ref 0.9–3.3)

## 2013-04-14 LAB — COMPREHENSIVE METABOLIC PANEL (CC13)
ALT: 15 U/L (ref 0–55)
Albumin: 3 g/dL — ABNORMAL LOW (ref 3.5–5.0)
Anion Gap: 9 mEq/L (ref 3–11)
BUN: 12.2 mg/dL (ref 7.0–26.0)
CO2: 23 mEq/L (ref 22–29)
Calcium: 9.9 mg/dL (ref 8.4–10.4)
Chloride: 111 mEq/L — ABNORMAL HIGH (ref 98–109)
Creatinine: 1 mg/dL (ref 0.6–1.1)
Potassium: 4 mEq/L (ref 3.5–5.1)
Total Bilirubin: 0.59 mg/dL (ref 0.20–1.20)

## 2013-04-14 MED ORDER — SODIUM CHLORIDE 0.9 % IV SOLN
Freq: Once | INTRAVENOUS | Status: AC
  Administered 2013-04-14: 16:00:00 via INTRAVENOUS

## 2013-04-14 MED ORDER — OXYCODONE HCL 5 MG PO TABS: 15.0000 mg | ORAL_TABLET | ORAL | Status: DC | PRN

## 2013-04-14 MED ORDER — PROCHLORPERAZINE MALEATE 10 MG PO TABS: 10.0000 mg | ORAL_TABLET | Freq: Four times a day (QID) | ORAL | Status: DC | PRN

## 2013-04-14 MED ORDER — DEXAMETHASONE SODIUM PHOSPHATE 10 MG/ML IJ SOLN
INTRAMUSCULAR | Status: AC
  Filled 2013-04-14: qty 1

## 2013-04-14 MED ORDER — HEPARIN SOD (PORK) LOCK FLUSH 100 UNIT/ML IV SOLN
500.0000 [IU] | Freq: Once | INTRAVENOUS | Status: AC | PRN
  Administered 2013-04-14: 500 [IU]
  Filled 2013-04-14: qty 5

## 2013-04-14 MED ORDER — ONDANSETRON 8 MG/50ML IVPB (CHCC)
8.0000 mg | Freq: Once | INTRAVENOUS | Status: AC
  Administered 2013-04-14: 8 mg via INTRAVENOUS

## 2013-04-14 MED ORDER — SODIUM CHLORIDE 0.9 % IJ SOLN
10.0000 mL | INTRAMUSCULAR | Status: DC | PRN
  Administered 2013-04-14: 10 mL
  Filled 2013-04-14: qty 10

## 2013-04-14 MED ORDER — DEXAMETHASONE SODIUM PHOSPHATE 10 MG/ML IJ SOLN
10.0000 mg | Freq: Once | INTRAMUSCULAR | Status: AC
  Administered 2013-04-14: 10 mg via INTRAVENOUS

## 2013-04-14 MED ORDER — FENTANYL 100 MCG/HR TD PT72: 1.0000 | MEDICATED_PATCH | TRANSDERMAL | Status: DC

## 2013-04-14 MED ORDER — ONDANSETRON 8 MG/NS 50 ML IVPB
INTRAVENOUS | Status: AC
  Filled 2013-04-14: qty 8

## 2013-04-14 MED ORDER — DEXTROSE 5 % IV SOLN
20.0000 mg/m2 | Freq: Once | INTRAVENOUS | Status: AC
  Administered 2013-04-14: 46 mg via INTRAVENOUS
  Filled 2013-04-14: qty 23

## 2013-04-14 NOTE — Progress Notes (Signed)
Faxed documentation of 2014  flu shot to patient's primary. Family medicine [phone]

## 2013-04-14 NOTE — Patient Instructions (Signed)
[facility] Discharge Instructions for Patients Receiving Chemotherapy  Today you received the following chemotherapy agent: Kyprolis  To help prevent nausea and vomiting after your treatment, we encourage you to take your nausea medications as needed:  Zofran 8 mg every 8 hours as needed  Compazine 10 mg every 6 hours as needed   If you develop nausea and vomiting that is not controlled by your nausea medication, call the clinic.   BELOW ARE SYMPTOMS THAT SHOULD BE REPORTED IMMEDIATELY:  *FEVER GREATER THAN 100.5 F  *CHILLS WITH OR WITHOUT FEVER  NAUSEA AND VOMITING THAT IS NOT CONTROLLED WITH YOUR NAUSEA MEDICATION  *UNUSUAL SHORTNESS OF BREATH  *UNUSUAL BRUISING OR BLEEDING  TENDERNESS IN MOUTH AND THROAT WITH OR WITHOUT PRESENCE OF ULCERS  *URINARY PROBLEMS  *BOWEL PROBLEMS  UNUSUAL RASH Items with * indicate a potential emergency and should be followed up as soon as possible.  Feel free to call the clinic you have any questions or concerns. The clinic phone number is [phone].

## 2013-04-14 NOTE — Progress Notes (Signed)
Hematology and Oncology Follow Up Visit  [name] [id number] 15-May-1951 62 y.o. 04/14/2013 4:07 PM      Principle Diagnosis:  This is a 62 year old female with the following issues:   1. 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  She had a free kappa light chain around 1500. 2. Diagnosis of deep vein thrombosis in July 2011.   Prior Therapy:    1. Initially treated with melphalan and prednisone.  She had a partial response with free light chains down as low as 19, M spike down to 0.52 g/dL.  2. Patient treated with Revlimid at 25 mg 3 weeks on and 1 week off between November 2009 until November 2010.  She had a partial response, light chains down to 33 and M spike was not detected.   3. She was treated on maintenance Revlimid 15 mg to maintain her partial response. 4. She was on Zometa intermittently, on hold know. 5. Currently anticoagulated with Coumadin 7.5 mg since July 2011. 6. She received Velcade 1.3 mg/sq m for a total of 2.9 mg weekly, which started on 02/21/2011 with weekly dexamethasone .  Treatment has been on hold due to recurrent hospitalization for wound infection. Treatment restarted in in 06/2011 till 09/2011. 7. Kyprolis started on 02/04/12. She completed 6 months of therapy on 07/08/12  Current therapy: Resumed Kyprolis on 11/25/12. She is here for cycle 12 day 1.   Interim History:  [name] presents today for routine followup prior to her next cycle of chemotherapy.This is a pleasant 62 year old female with a few comorbid conditions as outlined above. She has been well since her last visit. Her pain is better with the increase in her Fentanyl patch to 100 mcg/hr. No neurological symptoms. She is able to ambulate. She less oxycodone for breakthrough pain now but needs higher dose at night time. She is still ambulating without problems.She is tolerating chemotherapy well. She  report faint sensory neuropathy without any interference with her function.  He is not reporting any new complications to systemic chemotherapy.  Medications: I have reviewed the patient's current medications.  Allergies:  Allergies  Allergen Reactions  . Ibuprofen Other (See Comments)    Patient does not remember.   . Morphine And Related Other (See Comments)    Patient does not remember reaction, believes it caused raised marks on skin.  . Other Rash    Tegaderm dressing    Past Medical History, Surgical history, Social history, and Family History were reviewed and updated.  Review of Systems:  Remaining ROS negative.  Physical Exam: There were no vitals taken for this visit. ECOG: 1 General appearance: alert Head: Normocephalic, without obvious abnormality, atraumatic Neck: no adenopathy, no carotid bruit, no JVD, supple, symmetrical, trachea midline and thyroid not enlarged, symmetric, no tenderness/mass/nodules Lymph nodes: Cervical, supraclavicular, and axillary nodes normal. Heart:regular rate and rhythm, S1, S2 normal, no murmur, click, rub or gallop Lung:chest clear, no wheezing, rales, normal symmetric air entry, Heart exam - S1, S2 normal, no murmur, no gallop, rate regular Abdomen: soft, non-tender, without masses or organomegaly. Wound appear have healed. EXT:no erythema, induration, or nodules. 2-3+ bilateral edema in the ankles/feet. Neurological: No deficits detected with limited exam.   Lab Results: Lab Results  Component Value Date   WBC 3.1* 04/14/2013   HGB 12.3 04/14/2013   HCT 38.5 04/14/2013   MCV 91.4 04/14/2013   PLT 136* 04/14/2013  Chemistry      Component Value Date/Time   NA 143 04/14/2013 1515   NA 142 01/16/2013 1720   K 4.0 04/14/2013 1515   K 3.6 01/16/2013 1720   CL 110 01/16/2013 1720   CL 110* 12/02/2012 1444   CO2 23 04/14/2013 1515   CO2 26 01/16/2013 1720   BUN 12.2 04/14/2013 1515   BUN 9 01/16/2013 1720   CREATININE 1.0  04/14/2013 1515   CREATININE 0.94 01/16/2013 1720      Component Value Date/Time   CALCIUM 9.9 04/14/2013 1515   CALCIUM 9.6 01/16/2013 1720   ALKPHOS 59 04/14/2013 1515   ALKPHOS 57 02/11/2012 1445   AST 23 04/14/2013 1515   AST 15 02/11/2012 1445   ALT 15 04/14/2013 1515   ALT 12 02/11/2012 1445   BILITOT 0.59 04/14/2013 1515   BILITOT 0.5 02/11/2012 1445      Results for [name], [name] (MRN [id number]) as of 04/14/2013 15:37  Ref. Range 02/17/2013 14:48 03/17/2013 14:40  Kappa free light chain Latest Range: 0.33-1.94 mg/dL 409.81 (H) 191.47 (H)  Lambda Free Lght Chn Latest Range: 0.57-2.63 mg/dL <8.29 (L) 5.62 (L)    Impression and Plan:  This is a 62 year old female with the following issues:  1. Light chain multiple myeloma. Recently completed Kyprolis with improvement in light chains. Kappa light chains were up to 1200 but dropped down to 567 after chemotherapy restart. She is clinically doing better on therapy. Recommend that she proceed with cycle 12 day 1 today without dose modification.  2. Hypercalcemia. Calcium is normal today. She will receive Zometa every 8 weeks. This will be repeated in 04/28/2013 3. Pain in her hips/legs: Better with Fentanyl to 100 mcg/hr.Anemia, multifactorial. Hemoglobin stable. 4. History of deep vein thrombosis. Previously anticoagulated with Coumadin. No new clots noted. No more anticoagulation at this time.  5. Hypertension seems to be under relatively good control.  6. LE edema. Resolved now  7. IV access: PAC in place.  8. Follow up. In 4 weeks as scheduled    Shatavia Santor 10/22/20144:07 PM

## 2013-04-15 ENCOUNTER — Other Ambulatory Visit: Payer: Self-pay | Admitting: Oncology

## 2013-04-15 ENCOUNTER — Ambulatory Visit (HOSPITAL_BASED_OUTPATIENT_CLINIC_OR_DEPARTMENT_OTHER): Payer: Medicaid Other

## 2013-04-15 ENCOUNTER — Telehealth: Payer: Self-pay | Admitting: Oncology

## 2013-04-15 VITALS — BP 122/70 | HR 81 | Temp 97.0°F | Resp 18

## 2013-04-15 DIAGNOSIS — C9002 Multiple myeloma in relapse: Secondary | ICD-10-CM

## 2013-04-15 DIAGNOSIS — Z5112 Encounter for antineoplastic immunotherapy: Secondary | ICD-10-CM

## 2013-04-15 DIAGNOSIS — C9000 Multiple myeloma not having achieved remission: Secondary | ICD-10-CM

## 2013-04-15 LAB — KAPPA/LAMBDA LIGHT CHAINS
Kappa:Lambda Ratio: 698.11 — ABNORMAL HIGH (ref 0.26–1.65)
Lambda Free Lght Chn: 0.53 mg/dL — ABNORMAL LOW (ref 0.57–2.63)

## 2013-04-15 MED ORDER — SODIUM CHLORIDE 0.9 % IJ SOLN
10.0000 mL | INTRAMUSCULAR | Status: DC | PRN
  Filled 2013-04-15: qty 10

## 2013-04-15 MED ORDER — DEXAMETHASONE SODIUM PHOSPHATE 10 MG/ML IJ SOLN
INTRAMUSCULAR | Status: AC
  Filled 2013-04-15: qty 1

## 2013-04-15 MED ORDER — DEXAMETHASONE SODIUM PHOSPHATE 10 MG/ML IJ SOLN
10.0000 mg | Freq: Once | INTRAMUSCULAR | Status: AC
  Administered 2013-04-15: 10 mg via INTRAVENOUS

## 2013-04-15 MED ORDER — ONDANSETRON 8 MG/50ML IVPB (CHCC)
8.0000 mg | Freq: Once | INTRAVENOUS | Status: AC
  Administered 2013-04-15: 8 mg via INTRAVENOUS

## 2013-04-15 MED ORDER — SODIUM CHLORIDE 0.9 % IV SOLN: Freq: Once | INTRAVENOUS | Status: DC

## 2013-04-15 MED ORDER — DEXTROSE 5 % IV SOLN
20.0000 mg/m2 | Freq: Once | INTRAVENOUS | Status: AC
  Administered 2013-04-15: 46 mg via INTRAVENOUS
  Filled 2013-04-15: qty 23

## 2013-04-15 MED ORDER — SODIUM CHLORIDE 0.9 % IV SOLN
Freq: Once | INTRAVENOUS | Status: AC
  Administered 2013-04-15: 15:00:00 via INTRAVENOUS

## 2013-04-15 MED ORDER — HEPARIN SOD (PORK) LOCK FLUSH 100 UNIT/ML IV SOLN
500.0000 [IU] | Freq: Once | INTRAVENOUS | Status: DC | PRN
  Filled 2013-04-15: qty 5

## 2013-04-15 MED ORDER — ONDANSETRON 8 MG/NS 50 ML IVPB
INTRAVENOUS | Status: AC
  Filled 2013-04-15: qty 8

## 2013-04-15 NOTE — Telephone Encounter (Signed)
Sent michelle a staff message to add the pt's chemo appts. Will contact the pt with her nov appt.

## 2013-04-16 ENCOUNTER — Telehealth: Payer: Self-pay | Admitting: *Deleted

## 2013-04-16 ENCOUNTER — Other Ambulatory Visit: Payer: Self-pay | Admitting: Oncology

## 2013-04-16 ENCOUNTER — Telehealth: Payer: Self-pay | Admitting: Oncology

## 2013-04-16 NOTE — Telephone Encounter (Signed)
Pt aware to pick up a schedule the next time she comes in the bldg for her chemo appt

## 2013-04-16 NOTE — Telephone Encounter (Signed)
Per staff message and POF I have scheduled appts.  JMW  

## 2013-04-21 ENCOUNTER — Other Ambulatory Visit (HOSPITAL_BASED_OUTPATIENT_CLINIC_OR_DEPARTMENT_OTHER): Payer: Medicaid Other | Admitting: Lab

## 2013-04-21 ENCOUNTER — Ambulatory Visit (HOSPITAL_BASED_OUTPATIENT_CLINIC_OR_DEPARTMENT_OTHER): Payer: Medicaid Other

## 2013-04-21 VITALS — BP 104/75 | HR 72 | Temp 98.5°F | Resp 18

## 2013-04-21 DIAGNOSIS — C9002 Multiple myeloma in relapse: Secondary | ICD-10-CM

## 2013-04-21 DIAGNOSIS — C9000 Multiple myeloma not having achieved remission: Secondary | ICD-10-CM

## 2013-04-21 DIAGNOSIS — Z5112 Encounter for antineoplastic immunotherapy: Secondary | ICD-10-CM

## 2013-04-21 LAB — CBC WITH DIFFERENTIAL/PLATELET
BASO%: 0.6 % (ref 0.0–2.0)
EOS%: 0.6 % (ref 0.0–7.0)
HCT: 37.2 % (ref 34.8–46.6)
LYMPH%: 41.1 % (ref 14.0–49.7)
MCH: 29.2 pg (ref 25.1–34.0)
MCHC: 33.3 g/dL (ref 31.5–36.0)
MONO%: 16.4 % — ABNORMAL HIGH (ref 0.0–14.0)
NEUT%: 41.3 % (ref 38.4–76.8)
RDW: 14.9 % — ABNORMAL HIGH (ref 11.2–14.5)
lymph#: 1.5 10*3/uL (ref 0.9–3.3)

## 2013-04-21 MED ORDER — HEPARIN SOD (PORK) LOCK FLUSH 100 UNIT/ML IV SOLN
500.0000 [IU] | Freq: Once | INTRAVENOUS | Status: AC | PRN
  Administered 2013-04-21: 500 [IU]
  Filled 2013-04-21: qty 5

## 2013-04-21 MED ORDER — SODIUM CHLORIDE 0.9 % IJ SOLN
10.0000 mL | INTRAMUSCULAR | Status: DC | PRN
  Administered 2013-04-21: 10 mL
  Filled 2013-04-21: qty 10

## 2013-04-21 MED ORDER — DEXAMETHASONE SODIUM PHOSPHATE 10 MG/ML IJ SOLN
INTRAMUSCULAR | Status: AC
  Filled 2013-04-21: qty 1

## 2013-04-21 MED ORDER — SODIUM CHLORIDE 0.9 % IV SOLN
Freq: Once | INTRAVENOUS | Status: AC
  Administered 2013-04-21: 16:00:00 via INTRAVENOUS

## 2013-04-21 MED ORDER — DEXAMETHASONE SODIUM PHOSPHATE 10 MG/ML IJ SOLN
10.0000 mg | Freq: Once | INTRAMUSCULAR | Status: AC
  Administered 2013-04-21: 10 mg via INTRAVENOUS

## 2013-04-21 MED ORDER — ONDANSETRON 8 MG/50ML IVPB (CHCC)
8.0000 mg | Freq: Once | INTRAVENOUS | Status: AC
  Administered 2013-04-21: 8 mg via INTRAVENOUS

## 2013-04-21 MED ORDER — DEXTROSE 5 % IV SOLN
20.0000 mg/m2 | Freq: Once | INTRAVENOUS | Status: AC
  Administered 2013-04-21: 46 mg via INTRAVENOUS
  Filled 2013-04-21: qty 23

## 2013-04-21 NOTE — Patient Instructions (Signed)
[facility] Discharge Instructions for Patients Receiving Chemotherapy  Today you received the following chemotherapy agents Kyprolis To help prevent nausea and vomiting after your treatment, we encourage you to take your nausea medication as prescribed.  If you develop nausea and vomiting that is not controlled by your nausea medication, call the clinic.   BELOW ARE SYMPTOMS THAT SHOULD BE REPORTED IMMEDIATELY:  *FEVER GREATER THAN 100.5 F  *CHILLS WITH OR WITHOUT FEVER  NAUSEA AND VOMITING THAT IS NOT CONTROLLED WITH YOUR NAUSEA MEDICATION  *UNUSUAL SHORTNESS OF BREATH  *UNUSUAL BRUISING OR BLEEDING  TENDERNESS IN MOUTH AND THROAT WITH OR WITHOUT PRESENCE OF ULCERS  *URINARY PROBLEMS  *BOWEL PROBLEMS  UNUSUAL RASH Items with * indicate a potential emergency and should be followed up as soon as possible.  Feel free to call the clinic you have any questions or concerns. The clinic phone number is [phone].    

## 2013-04-22 ENCOUNTER — Encounter (INDEPENDENT_AMBULATORY_CARE_PROVIDER_SITE_OTHER): Payer: Self-pay

## 2013-04-22 ENCOUNTER — Ambulatory Visit (HOSPITAL_BASED_OUTPATIENT_CLINIC_OR_DEPARTMENT_OTHER): Payer: Medicaid Other

## 2013-04-22 VITALS — BP 112/56 | HR 74 | Temp 98.7°F

## 2013-04-22 DIAGNOSIS — Z5112 Encounter for antineoplastic immunotherapy: Secondary | ICD-10-CM

## 2013-04-22 DIAGNOSIS — C9000 Multiple myeloma not having achieved remission: Secondary | ICD-10-CM

## 2013-04-22 MED ORDER — ONDANSETRON 8 MG/50ML IVPB (CHCC)
8.0000 mg | Freq: Once | INTRAVENOUS | Status: AC
  Administered 2013-04-22: 8 mg via INTRAVENOUS

## 2013-04-22 MED ORDER — HEPARIN SOD (PORK) LOCK FLUSH 100 UNIT/ML IV SOLN
500.0000 [IU] | Freq: Once | INTRAVENOUS | Status: AC | PRN
  Administered 2013-04-22: 500 [IU]
  Filled 2013-04-22: qty 5

## 2013-04-22 MED ORDER — LIDOCAINE-PRILOCAINE 2.5-2.5 % EX CREA: TOPICAL_CREAM | CUTANEOUS | Status: AC | PRN

## 2013-04-22 MED ORDER — DEXAMETHASONE SODIUM PHOSPHATE 10 MG/ML IJ SOLN
INTRAMUSCULAR | Status: AC
  Filled 2013-04-22: qty 1

## 2013-04-22 MED ORDER — DEXAMETHASONE SODIUM PHOSPHATE 10 MG/ML IJ SOLN
10.0000 mg | Freq: Once | INTRAMUSCULAR | Status: AC
  Administered 2013-04-22: 10 mg via INTRAVENOUS

## 2013-04-22 MED ORDER — DEXTROSE 5 % IV SOLN
20.0000 mg/m2 | Freq: Once | INTRAVENOUS | Status: AC
  Administered 2013-04-22: 46 mg via INTRAVENOUS
  Filled 2013-04-22: qty 23

## 2013-04-22 MED ORDER — SODIUM CHLORIDE 0.9 % IJ SOLN
10.0000 mL | INTRAMUSCULAR | Status: DC | PRN
  Administered 2013-04-22: 10 mL
  Filled 2013-04-22: qty 10

## 2013-04-22 MED ORDER — ONDANSETRON 8 MG/NS 50 ML IVPB
INTRAVENOUS | Status: AC
  Filled 2013-04-22: qty 8

## 2013-04-22 MED ORDER — SODIUM CHLORIDE 0.9 % IV SOLN
Freq: Once | INTRAVENOUS | Status: AC
  Administered 2013-04-22: 15:00:00 via INTRAVENOUS

## 2013-04-22 MED ORDER — ONDANSETRON HCL 8 MG PO TABS: 8.0000 mg | ORAL_TABLET | Freq: Three times a day (TID) | ORAL | Status: DC | PRN

## 2013-04-22 NOTE — Patient Instructions (Signed)
[facility] Discharge Instructions for Patients Receiving Chemotherapy  Today you received the following chemotherapy agents kyprolis  To help prevent nausea and vomiting after your treatment, we encourage you to take your nausea medication as needed   If you develop nausea and vomiting that is not controlled by your nausea medication, call the clinic.   BELOW ARE SYMPTOMS THAT SHOULD BE REPORTED IMMEDIATELY:  *FEVER GREATER THAN 100.5 F  *CHILLS WITH OR WITHOUT FEVER  NAUSEA AND VOMITING THAT IS NOT CONTROLLED WITH YOUR NAUSEA MEDICATION  *UNUSUAL SHORTNESS OF BREATH  *UNUSUAL BRUISING OR BLEEDING  TENDERNESS IN MOUTH AND THROAT WITH OR WITHOUT PRESENCE OF ULCERS  *URINARY PROBLEMS  *BOWEL PROBLEMS  UNUSUAL RASH Items with * indicate a potential emergency and should be followed up as soon as possible.  Feel free to call the clinic you have any questions or concerns. The clinic phone number is [phone].    

## 2013-04-23 MED ORDER — DIPHENHYDRAMINE HCL 50 MG/ML IJ SOLN
INTRAMUSCULAR | Status: AC
  Filled 2013-04-23: qty 1

## 2013-04-23 MED ORDER — ACETAMINOPHEN 325 MG PO TABS
ORAL_TABLET | ORAL | Status: AC
  Filled 2013-04-23: qty 2

## 2013-04-28 ENCOUNTER — Ambulatory Visit (HOSPITAL_BASED_OUTPATIENT_CLINIC_OR_DEPARTMENT_OTHER): Payer: Medicaid Other

## 2013-04-28 ENCOUNTER — Other Ambulatory Visit (HOSPITAL_BASED_OUTPATIENT_CLINIC_OR_DEPARTMENT_OTHER): Payer: Medicaid Other | Admitting: Lab

## 2013-04-28 ENCOUNTER — Encounter (INDEPENDENT_AMBULATORY_CARE_PROVIDER_SITE_OTHER): Payer: Self-pay

## 2013-04-28 VITALS — BP 111/68 | HR 89 | Resp 16

## 2013-04-28 DIAGNOSIS — C9002 Multiple myeloma in relapse: Secondary | ICD-10-CM

## 2013-04-28 DIAGNOSIS — K436 Other and unspecified ventral hernia with obstruction, without gangrene: Secondary | ICD-10-CM

## 2013-04-28 DIAGNOSIS — Z5112 Encounter for antineoplastic immunotherapy: Secondary | ICD-10-CM

## 2013-04-28 DIAGNOSIS — L02211 Cutaneous abscess of abdominal wall: Secondary | ICD-10-CM

## 2013-04-28 DIAGNOSIS — C9000 Multiple myeloma not having achieved remission: Secondary | ICD-10-CM

## 2013-04-28 DIAGNOSIS — K430 Incisional hernia with obstruction, without gangrene: Secondary | ICD-10-CM

## 2013-04-28 LAB — COMPREHENSIVE METABOLIC PANEL (CC13)
ALT: 14 U/L (ref 0–55)
AST: 23 U/L (ref 5–34)
Alkaline Phosphatase: 56 U/L (ref 40–150)
BUN: 13.2 mg/dL (ref 7.0–26.0)
Calcium: 9 mg/dL (ref 8.4–10.4)
Chloride: 112 mEq/L — ABNORMAL HIGH (ref 98–109)
Creatinine: 1 mg/dL (ref 0.6–1.1)
Potassium: 4.1 mEq/L (ref 3.5–5.1)
Sodium: 143 mEq/L (ref 136–145)
Total Protein: 6.1 g/dL — ABNORMAL LOW (ref 6.4–8.3)

## 2013-04-28 LAB — CBC WITH DIFFERENTIAL/PLATELET
BASO%: 1 % (ref 0.0–2.0)
EOS%: 0.3 % (ref 0.0–7.0)
HCT: 38.6 % (ref 34.8–46.6)
MCH: 29.3 pg (ref 25.1–34.0)
MCHC: 32.3 g/dL (ref 31.5–36.0)
MCV: 90.8 fL (ref 79.5–101.0)
MONO#: 0.6 10*3/uL (ref 0.1–0.9)
MONO%: 13 % (ref 0.0–14.0)
NEUT%: 59.1 % (ref 38.4–76.8)
RDW: 14.7 % — ABNORMAL HIGH (ref 11.2–14.5)
lymph#: 1.3 10*3/uL (ref 0.9–3.3)

## 2013-04-28 MED ORDER — ZOLEDRONIC ACID 4 MG/100ML IV SOLN
4.0000 mg | Freq: Once | INTRAVENOUS | Status: AC
  Administered 2013-04-28: 4 mg via INTRAVENOUS
  Filled 2013-04-28: qty 100

## 2013-04-28 MED ORDER — DEXAMETHASONE SODIUM PHOSPHATE 10 MG/ML IJ SOLN
10.0000 mg | Freq: Once | INTRAMUSCULAR | Status: AC
  Administered 2013-04-28: 10 mg via INTRAVENOUS

## 2013-04-28 MED ORDER — SODIUM CHLORIDE 0.9 % IV SOLN
Freq: Once | INTRAVENOUS | Status: AC
  Administered 2013-04-28: 16:00:00 via INTRAVENOUS

## 2013-04-28 MED ORDER — ONDANSETRON 8 MG/50ML IVPB (CHCC)
8.0000 mg | Freq: Once | INTRAVENOUS | Status: AC
  Administered 2013-04-28: 8 mg via INTRAVENOUS

## 2013-04-28 MED ORDER — SODIUM CHLORIDE 0.9 % IJ SOLN
10.0000 mL | INTRAMUSCULAR | Status: DC | PRN
  Administered 2013-04-28: 10 mL
  Filled 2013-04-28: qty 10

## 2013-04-28 MED ORDER — DEXTROSE 5 % IV SOLN
20.0000 mg/m2 | Freq: Once | INTRAVENOUS | Status: AC
  Administered 2013-04-28: 46 mg via INTRAVENOUS
  Filled 2013-04-28: qty 23

## 2013-04-28 MED ORDER — DEXAMETHASONE SODIUM PHOSPHATE 10 MG/ML IJ SOLN
INTRAMUSCULAR | Status: AC
  Filled 2013-04-28: qty 1

## 2013-04-28 MED ORDER — ONDANSETRON 8 MG/NS 50 ML IVPB
INTRAVENOUS | Status: AC
  Filled 2013-04-28: qty 8

## 2013-04-28 MED ORDER — SODIUM CHLORIDE 0.9 % IV SOLN: Freq: Once | INTRAVENOUS | Status: DC

## 2013-04-28 MED ORDER — HEPARIN SOD (PORK) LOCK FLUSH 100 UNIT/ML IV SOLN
500.0000 [IU] | Freq: Once | INTRAVENOUS | Status: AC | PRN
  Administered 2013-04-28: 500 [IU]
  Filled 2013-04-28: qty 5

## 2013-04-28 NOTE — Patient Instructions (Signed)
[facility] Discharge Instructions for Patients Receiving Chemotherapy  Today you received the following chemotherapy agents Kyprolis To help prevent nausea and vomiting after your treatment, we encourage you to take your nausea medication as prescribed.  If you develop nausea and vomiting that is not controlled by your nausea medication, call the clinic.   BELOW ARE SYMPTOMS THAT SHOULD BE REPORTED IMMEDIATELY:  *FEVER GREATER THAN 100.5 F  *CHILLS WITH OR WITHOUT FEVER  NAUSEA AND VOMITING THAT IS NOT CONTROLLED WITH YOUR NAUSEA MEDICATION  *UNUSUAL SHORTNESS OF BREATH  *UNUSUAL BRUISING OR BLEEDING  TENDERNESS IN MOUTH AND THROAT WITH OR WITHOUT PRESENCE OF ULCERS  *URINARY PROBLEMS  *BOWEL PROBLEMS  UNUSUAL RASH Items with * indicate a potential emergency and should be followed up as soon as possible.  Feel free to call the clinic you have any questions or concerns. The clinic phone number is [phone].    

## 2013-04-29 ENCOUNTER — Ambulatory Visit: Payer: Medicaid Other | Admitting: Lab

## 2013-04-29 ENCOUNTER — Ambulatory Visit (HOSPITAL_BASED_OUTPATIENT_CLINIC_OR_DEPARTMENT_OTHER): Payer: Medicaid Other

## 2013-04-29 DIAGNOSIS — Z5112 Encounter for antineoplastic immunotherapy: Secondary | ICD-10-CM

## 2013-04-29 DIAGNOSIS — C9000 Multiple myeloma not having achieved remission: Secondary | ICD-10-CM

## 2013-04-29 MED ORDER — ONDANSETRON 8 MG/NS 50 ML IVPB
INTRAVENOUS | Status: AC
  Filled 2013-04-29: qty 8

## 2013-04-29 MED ORDER — SODIUM CHLORIDE 0.9 % IV SOLN
Freq: Once | INTRAVENOUS | Status: AC
  Administered 2013-04-29: 13:00:00 via INTRAVENOUS

## 2013-04-29 MED ORDER — DEXAMETHASONE SODIUM PHOSPHATE 10 MG/ML IJ SOLN
INTRAMUSCULAR | Status: AC
  Filled 2013-04-29: qty 1

## 2013-04-29 MED ORDER — DEXAMETHASONE SODIUM PHOSPHATE 10 MG/ML IJ SOLN
10.0000 mg | Freq: Once | INTRAMUSCULAR | Status: AC
  Administered 2013-04-29: 10 mg via INTRAVENOUS

## 2013-04-29 MED ORDER — DEXTROSE 5 % IV SOLN
20.0000 mg/m2 | Freq: Once | INTRAVENOUS | Status: AC
  Administered 2013-04-29: 46 mg via INTRAVENOUS
  Filled 2013-04-29: qty 23

## 2013-04-29 MED ORDER — ONDANSETRON 8 MG/50ML IVPB (CHCC)
8.0000 mg | Freq: Once | INTRAVENOUS | Status: AC
  Administered 2013-04-29: 8 mg via INTRAVENOUS

## 2013-04-29 MED ORDER — SODIUM CHLORIDE 0.9 % IV SOLN
Freq: Once | INTRAVENOUS | Status: AC
  Administered 2013-04-29: 14:00:00 via INTRAVENOUS

## 2013-04-29 NOTE — Patient Instructions (Signed)
[facility] Discharge Instructions for Patients Receiving Chemotherapy  Today you received the following chemotherapy agents Kyprolis.  To help prevent nausea and vomiting after your treatment, we encourage you to take your nausea medication.   If you develop nausea and vomiting that is not controlled by your nausea medication, call the clinic.   BELOW ARE SYMPTOMS THAT SHOULD BE REPORTED IMMEDIATELY:  *FEVER GREATER THAN 100.5 F  *CHILLS WITH OR WITHOUT FEVER  NAUSEA AND VOMITING THAT IS NOT CONTROLLED WITH YOUR NAUSEA MEDICATION  *UNUSUAL SHORTNESS OF BREATH  *UNUSUAL BRUISING OR BLEEDING  TENDERNESS IN MOUTH AND THROAT WITH OR WITHOUT PRESENCE OF ULCERS  *URINARY PROBLEMS  *BOWEL PROBLEMS  UNUSUAL RASH Items with * indicate a potential emergency and should be followed up as soon as possible.  Feel free to call the clinic you have any questions or concerns. The clinic phone number is [phone].    

## 2013-04-29 NOTE — Progress Notes (Signed)
Pt did not want her AVS.  She knows her next appt but states that it is too early & she will stop by the schedulers on the way out to change schedule.

## 2013-05-04 ENCOUNTER — Telehealth: Payer: Self-pay | Admitting: *Deleted

## 2013-05-04 NOTE — Telephone Encounter (Signed)
Er staff message I have called the patient regarding her appts. Per patient request she wants to have her appt on 11/19 later in the day. Message sent to desk RN. Patient aware that I will call her back.  JMW

## 2013-05-04 NOTE — Telephone Encounter (Signed)
Per email from Dr. Clelia Croft ok to move appts. Called patient with appts. She will try yo be here by then

## 2013-05-12 ENCOUNTER — Other Ambulatory Visit (HOSPITAL_BASED_OUTPATIENT_CLINIC_OR_DEPARTMENT_OTHER): Payer: Medicaid Other | Admitting: Lab

## 2013-05-12 ENCOUNTER — Ambulatory Visit: Payer: Medicaid Other

## 2013-05-12 ENCOUNTER — Ambulatory Visit (HOSPITAL_BASED_OUTPATIENT_CLINIC_OR_DEPARTMENT_OTHER): Payer: Medicaid Other | Admitting: Oncology

## 2013-05-12 ENCOUNTER — Telehealth: Payer: Self-pay | Admitting: Oncology

## 2013-05-12 VITALS — BP 142/66 | HR 89 | Temp 99.9°F | Resp 18 | Ht 65.0 in

## 2013-05-12 DIAGNOSIS — C9000 Multiple myeloma not having achieved remission: Secondary | ICD-10-CM

## 2013-05-12 DIAGNOSIS — M79609 Pain in unspecified limb: Secondary | ICD-10-CM

## 2013-05-12 DIAGNOSIS — Z86718 Personal history of other venous thrombosis and embolism: Secondary | ICD-10-CM

## 2013-05-12 DIAGNOSIS — I10 Essential (primary) hypertension: Secondary | ICD-10-CM

## 2013-05-12 DIAGNOSIS — Z5112 Encounter for antineoplastic immunotherapy: Secondary | ICD-10-CM

## 2013-05-12 LAB — COMPREHENSIVE METABOLIC PANEL (CC13)
Albumin: 3 g/dL — ABNORMAL LOW (ref 3.5–5.0)
Anion Gap: 9 mEq/L (ref 3–11)
BUN: 8.3 mg/dL (ref 7.0–26.0)
CO2: 25 mEq/L (ref 22–29)
Calcium: 9.5 mg/dL (ref 8.4–10.4)
Chloride: 111 mEq/L — ABNORMAL HIGH (ref 98–109)
Creatinine: 0.8 mg/dL (ref 0.6–1.1)
Glucose: 86 mg/dl (ref 70–140)
Potassium: 4.1 mEq/L (ref 3.5–5.1)

## 2013-05-12 LAB — CBC WITH DIFFERENTIAL/PLATELET
BASO%: 0.9 % (ref 0.0–2.0)
Basophils Absolute: 0 10*3/uL (ref 0.0–0.1)
Eosinophils Absolute: 0 10*3/uL (ref 0.0–0.5)
HCT: 37.2 % (ref 34.8–46.6)
HGB: 11.8 g/dL (ref 11.6–15.9)
MCH: 29 pg (ref 25.1–34.0)
MCHC: 31.8 g/dL (ref 31.5–36.0)
MONO#: 0.4 10*3/uL (ref 0.1–0.9)
NEUT#: 2.6 10*3/uL (ref 1.5–6.5)
NEUT%: 60.4 % (ref 38.4–76.8)
RDW: 15.2 % — ABNORMAL HIGH (ref 11.2–14.5)
WBC: 4.3 10*3/uL (ref 3.9–10.3)
lymph#: 1.2 10*3/uL (ref 0.9–3.3)

## 2013-05-12 MED ORDER — DEXAMETHASONE SODIUM PHOSPHATE 10 MG/ML IJ SOLN
INTRAMUSCULAR | Status: AC
  Filled 2013-05-12: qty 1

## 2013-05-12 MED ORDER — ONDANSETRON 8 MG/50ML IVPB (CHCC)
8.0000 mg | Freq: Once | INTRAVENOUS | Status: AC
  Administered 2013-05-12: 8 mg via INTRAVENOUS

## 2013-05-12 MED ORDER — DEXAMETHASONE SODIUM PHOSPHATE 10 MG/ML IJ SOLN
10.0000 mg | Freq: Once | INTRAMUSCULAR | Status: AC
  Administered 2013-05-12: 10 mg via INTRAVENOUS

## 2013-05-12 MED ORDER — HEPARIN SOD (PORK) LOCK FLUSH 100 UNIT/ML IV SOLN
500.0000 [IU] | Freq: Once | INTRAVENOUS | Status: AC | PRN
  Administered 2013-05-12: 500 [IU]
  Filled 2013-05-12: qty 5

## 2013-05-12 MED ORDER — SODIUM CHLORIDE 0.9 % IV SOLN
Freq: Once | INTRAVENOUS | Status: AC
  Administered 2013-05-12: 17:00:00 via INTRAVENOUS

## 2013-05-12 MED ORDER — DEXTROSE 5 % IV SOLN
20.0000 mg/m2 | Freq: Once | INTRAVENOUS | Status: AC
  Administered 2013-05-12: 46 mg via INTRAVENOUS
  Filled 2013-05-12: qty 23

## 2013-05-12 MED ORDER — SODIUM CHLORIDE 0.9 % IJ SOLN
10.0000 mL | INTRAMUSCULAR | Status: DC | PRN
  Administered 2013-05-12: 10 mL
  Filled 2013-05-12: qty 10

## 2013-05-12 NOTE — Progress Notes (Signed)
Hematology and Oncology Follow Up Visit  [name] [id number] Jan 18, 1951 62 y.o. 05/12/2013 3:47 PM      Principle Diagnosis:  This is a 62 year old female with the following issues:   1. 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  She had a free kappa light chain around 1500. 2. Diagnosis of deep vein thrombosis in July 2011.   Prior Therapy:    1. Initially treated with melphalan and prednisone.  She had a partial response with free light chains down as low as 19, M spike down to 0.52 g/dL.  2. Patient treated with Revlimid at 25 mg 3 weeks on and 1 week off between November 2009 until November 2010.  She had a partial response, light chains down to 33 and M spike was not detected.   3. She was treated on maintenance Revlimid 15 mg to maintain her partial response. 4. She was on Zometa intermittently, on hold know. 5. Currently anticoagulated with Coumadin 7.5 mg since July 2011. 6. She received Velcade 1.3 mg/sq m for a total of 2.9 mg weekly, which started on 02/21/2011 with weekly dexamethasone .  Treatment has been on hold due to recurrent hospitalization for wound infection. Treatment restarted in in 06/2011 till 09/2011. 7. Kyprolis started on 02/04/12. She completed 6 months of therapy on 07/08/12  Current therapy: Resumed Kyprolis on 11/25/12. She is here for cycle 13 day 1.   Interim History:  [name] presents today for routine followup prior to her next cycle of chemotherapy.This is a pleasant 62 year old female with a few comorbid conditions as outlined above. She has been well since her last visit. Her pain is better with Fentanyl patch to 100 mcg/hr. No neurological symptoms. She is able to ambulate. She less oxycodone for breakthrough pain now but needs higher dose at night time. She is still ambulating without problems.She is tolerating chemotherapy well. 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  He is not reporting any new complications to systemic chemotherapy.  Medications: I have reviewed the patient's current medications.  Allergies:  Allergies  Allergen Reactions  . Ibuprofen Other (See Comments)    Patient does not remember.   . Morphine And Related Other (See Comments)    Patient does not remember reaction, believes it caused raised marks on skin.  . Other Rash    Tegaderm dressing    Past Medical History, Surgical history, Social history, and Family History were reviewed and updated.  Review of Systems:  Remaining ROS negative.  Physical Exam: Blood pressure 142/66, pulse 89, temperature 99.9 F (37.7 C), temperature source Oral, resp. rate 18, height 5\' 5"  (1.651 m), weight 0 lb (0 kg). ECOG: 1 General appearance: alert Head: Normocephalic, without obvious abnormality, atraumatic Neck: no adenopathy, no carotid bruit, no JVD, supple, symmetrical, trachea midline and thyroid not enlarged, symmetric, no tenderness/mass/nodules Lymph nodes: Cervical, supraclavicular, and axillary nodes normal. Heart:regular rate and rhythm, S1, S2 normal, no murmur, click, rub or gallop Lung:chest clear, no wheezing, rales, normal symmetric air entry, Heart exam - S1, S2 normal, no murmur, no gallop, rate regular Abdomen: soft, non-tender, without masses or organomegaly. Wound appear have healed. EXT:no erythema, induration, or nodules. 2-3+ bilateral edema in the ankles/feet. Neurological: No deficits detected with limited exam.   Lab Results: Lab Results  Component Value Date   WBC 4.3 05/12/2013   HGB 11.8 05/12/2013  HCT 37.2 05/12/2013   MCV 91.1 05/12/2013   PLT 182 05/12/2013     Chemistry      Component Value Date/Time   NA 144 05/12/2013 1506   NA 142 01/16/2013 1720   K 4.1 05/12/2013 1506   K 3.6 01/16/2013 1720   CL 110 01/16/2013 1720   CL 110* 12/02/2012 1444   CO2 25 05/12/2013 1506   CO2  26 01/16/2013 1720   BUN 8.3 05/12/2013 1506   BUN 9 01/16/2013 1720   CREATININE 0.8 05/12/2013 1506   CREATININE 0.94 01/16/2013 1720      Component Value Date/Time   CALCIUM 9.5 05/12/2013 1506   CALCIUM 9.6 01/16/2013 1720   ALKPHOS 62 05/12/2013 1506   ALKPHOS 57 02/11/2012 1445   AST 25 05/12/2013 1506   AST 15 02/11/2012 1445   ALT 16 05/12/2013 1506   ALT 12 02/11/2012 1445   BILITOT 0.84 05/12/2013 1506   BILITOT 0.5 02/11/2012 1445      Results for [name], [name] (MRN [id number]) as of 05/12/2013 15:42  Ref. Range 02/17/2013 14:48 03/17/2013 14:40 04/14/2013 15:14  Kappa free light chain Latest Range: 0.33-1.94 mg/dL 409.81 (H) 191.47 (H) 829.56 (H)     Impression and Plan:  This is a 62 year old female with the following issues:  1. Light chain multiple myeloma. Recently completed Kyprolis with improvement in light chains. Kappa light chains were up to 1200 but dropped down to 370 after chemotherapy restart. She is clinically doing better on therapy. Recommend that she proceed with cycle 13 day 1 today without dose modification.  2. Hypercalcemia. Calcium is normal today. She will receive Zometa every 8 weeks last treatment in 04/28/2014. This will be repeated in 06/2013. 3. Pain in her hips/legs: Better with Fentanyl to 100 mcg/hr.Anemia, multifactorial. Hemoglobin stable. 4. History of deep vein thrombosis. Previously anticoagulated with Coumadin. No new clots noted. No more anticoagulation at this time.  5. Hypertension seems to be under relatively good control.  6. LE edema. Resolved now  7. IV access: PAC in place.  8. Follow up. In 4 weeks.    Marquinn Meschke 11/19/20143:47 PM

## 2013-05-12 NOTE — Patient Instructions (Signed)
[facility] Discharge Instructions for Patients Receiving Chemotherapy  Today you received the following chemotherapy agents Kyprolis.  To help prevent nausea and vomiting after your treatment, we encourage you to take your nausea medication Compazine every 6 hours and Zofran every 8 hours as needed.    If you develop nausea and vomiting that is not controlled by your nausea medication, call the clinic.   BELOW ARE SYMPTOMS THAT SHOULD BE REPORTED IMMEDIATELY:  *FEVER GREATER THAN 100.5 F  *CHILLS WITH OR WITHOUT FEVER  NAUSEA AND VOMITING THAT IS NOT CONTROLLED WITH YOUR NAUSEA MEDICATION  *UNUSUAL SHORTNESS OF BREATH  *UNUSUAL BRUISING OR BLEEDING  TENDERNESS IN MOUTH AND THROAT WITH OR WITHOUT PRESENCE OF ULCERS  *URINARY PROBLEMS  *BOWEL PROBLEMS  UNUSUAL RASH Items with * indicate a potential emergency and should be followed up as soon as possible.  Feel free to call the clinic you have any questions or concerns. The clinic phone number is [phone].

## 2013-05-12 NOTE — Telephone Encounter (Signed)
gv and printed appt sched and avs for pt for NOV and DEC....sed added tx. °

## 2013-05-13 ENCOUNTER — Ambulatory Visit (HOSPITAL_BASED_OUTPATIENT_CLINIC_OR_DEPARTMENT_OTHER): Payer: Medicaid Other

## 2013-05-13 VITALS — BP 95/60 | HR 74 | Temp 97.0°F | Resp 18

## 2013-05-13 DIAGNOSIS — C9002 Multiple myeloma in relapse: Secondary | ICD-10-CM

## 2013-05-13 DIAGNOSIS — Z5112 Encounter for antineoplastic immunotherapy: Secondary | ICD-10-CM

## 2013-05-13 DIAGNOSIS — C9000 Multiple myeloma not having achieved remission: Secondary | ICD-10-CM

## 2013-05-13 MED ORDER — SODIUM CHLORIDE 0.9 % IJ SOLN
10.0000 mL | INTRAMUSCULAR | Status: DC | PRN
  Administered 2013-05-13: 10 mL
  Filled 2013-05-13: qty 10

## 2013-05-13 MED ORDER — SODIUM CHLORIDE 0.9 % IV SOLN
Freq: Once | INTRAVENOUS | Status: AC
  Administered 2013-05-13: 15:00:00 via INTRAVENOUS

## 2013-05-13 MED ORDER — DEXAMETHASONE SODIUM PHOSPHATE 10 MG/ML IJ SOLN
10.0000 mg | Freq: Once | INTRAMUSCULAR | Status: AC
  Administered 2013-05-13: 10 mg via INTRAVENOUS

## 2013-05-13 MED ORDER — ONDANSETRON 8 MG/NS 50 ML IVPB
INTRAVENOUS | Status: AC
  Filled 2013-05-13: qty 8

## 2013-05-13 MED ORDER — HEPARIN SOD (PORK) LOCK FLUSH 100 UNIT/ML IV SOLN
500.0000 [IU] | Freq: Once | INTRAVENOUS | Status: AC | PRN
  Administered 2013-05-13: 500 [IU]
  Filled 2013-05-13: qty 5

## 2013-05-13 MED ORDER — DEXTROSE 5 % IV SOLN
20.0000 mg/m2 | Freq: Once | INTRAVENOUS | Status: AC
  Administered 2013-05-13: 46 mg via INTRAVENOUS
  Filled 2013-05-13: qty 23

## 2013-05-13 MED ORDER — DEXAMETHASONE SODIUM PHOSPHATE 10 MG/ML IJ SOLN
INTRAMUSCULAR | Status: AC
  Filled 2013-05-13: qty 1

## 2013-05-13 MED ORDER — ONDANSETRON 8 MG/50ML IVPB (CHCC)
8.0000 mg | Freq: Once | INTRAVENOUS | Status: AC
  Administered 2013-05-13: 8 mg via INTRAVENOUS

## 2013-05-13 MED ORDER — SODIUM CHLORIDE 0.9 % IV SOLN
Freq: Once | INTRAVENOUS | Status: AC
  Administered 2013-05-13: 16:00:00 via INTRAVENOUS

## 2013-05-13 NOTE — Patient Instructions (Signed)
[facility] Discharge Instructions for Patients Receiving Chemotherapy  Today you received the following chemotherapy agent: Kyprolis  To help prevent nausea and vomiting after your treatment, we encourage you to take your nausea medication as prescribed.    If you develop nausea and vomiting that is not controlled by your nausea medication, call the clinic.   BELOW ARE SYMPTOMS THAT SHOULD BE REPORTED IMMEDIATELY:  *FEVER GREATER THAN 100.5 F  *CHILLS WITH OR WITHOUT FEVER  NAUSEA AND VOMITING THAT IS NOT CONTROLLED WITH YOUR NAUSEA MEDICATION  *UNUSUAL SHORTNESS OF BREATH  *UNUSUAL BRUISING OR BLEEDING  TENDERNESS IN MOUTH AND THROAT WITH OR WITHOUT PRESENCE OF ULCERS  *URINARY PROBLEMS  *BOWEL PROBLEMS  UNUSUAL RASH Items with * indicate a potential emergency and should be followed up as soon as possible.  Feel free to call the clinic you have any questions or concerns. The clinic phone number is [phone].    

## 2013-05-19 ENCOUNTER — Ambulatory Visit (HOSPITAL_BASED_OUTPATIENT_CLINIC_OR_DEPARTMENT_OTHER): Payer: Medicaid Other

## 2013-05-19 ENCOUNTER — Other Ambulatory Visit (HOSPITAL_BASED_OUTPATIENT_CLINIC_OR_DEPARTMENT_OTHER): Payer: Medicaid Other | Admitting: Lab

## 2013-05-19 VITALS — BP 100/66 | HR 89 | Temp 97.0°F | Resp 20

## 2013-05-19 DIAGNOSIS — C9002 Multiple myeloma in relapse: Secondary | ICD-10-CM

## 2013-05-19 DIAGNOSIS — C9000 Multiple myeloma not having achieved remission: Secondary | ICD-10-CM

## 2013-05-19 DIAGNOSIS — Z5112 Encounter for antineoplastic immunotherapy: Secondary | ICD-10-CM

## 2013-05-19 LAB — CBC WITH DIFFERENTIAL/PLATELET
Basophils Absolute: 0.1 10*3/uL (ref 0.0–0.1)
Eosinophils Absolute: 0 10*3/uL (ref 0.0–0.5)
HCT: 37.9 % (ref 34.8–46.6)
MONO#: 0.7 10*3/uL (ref 0.1–0.9)
MONO%: 14.6 % — ABNORMAL HIGH (ref 0.0–14.0)
NEUT#: 2.7 10*3/uL (ref 1.5–6.5)
NEUT%: 54.6 % (ref 38.4–76.8)
RBC: 4.14 10*6/uL (ref 3.70–5.45)
RDW: 15.3 % — ABNORMAL HIGH (ref 11.2–14.5)
WBC: 5 10*3/uL (ref 3.9–10.3)
lymph#: 1.5 10*3/uL (ref 0.9–3.3)

## 2013-05-19 LAB — COMPREHENSIVE METABOLIC PANEL (CC13)
Albumin: 3.1 g/dL — ABNORMAL LOW (ref 3.5–5.0)
Alkaline Phosphatase: 58 U/L (ref 40–150)
CO2: 25 mEq/L (ref 22–29)
Chloride: 111 mEq/L — ABNORMAL HIGH (ref 98–109)
Creatinine: 0.9 mg/dL (ref 0.6–1.1)
Glucose: 76 mg/dl (ref 70–140)
Potassium: 3.6 mEq/L (ref 3.5–5.1)
Sodium: 146 mEq/L — ABNORMAL HIGH (ref 136–145)
Total Protein: 6.2 g/dL — ABNORMAL LOW (ref 6.4–8.3)

## 2013-05-19 MED ORDER — LIDOCAINE-PRILOCAINE 2.5-2.5 % EX CREA
TOPICAL_CREAM | Freq: Once | CUTANEOUS | Status: AC
  Administered 2013-05-19: 15:00:00 via TOPICAL

## 2013-05-19 MED ORDER — SODIUM CHLORIDE 0.9 % IV SOLN
Freq: Once | INTRAVENOUS | Status: AC
  Administered 2013-05-19: 16:00:00 via INTRAVENOUS

## 2013-05-19 MED ORDER — SODIUM CHLORIDE 0.9 % IJ SOLN
10.0000 mL | INTRAMUSCULAR | Status: DC | PRN
  Administered 2013-05-19: 10 mL
  Filled 2013-05-19: qty 10

## 2013-05-19 MED ORDER — DEXAMETHASONE SODIUM PHOSPHATE 10 MG/ML IJ SOLN
INTRAMUSCULAR | Status: AC
  Filled 2013-05-19: qty 1

## 2013-05-19 MED ORDER — HEPARIN SOD (PORK) LOCK FLUSH 100 UNIT/ML IV SOLN
500.0000 [IU] | Freq: Once | INTRAVENOUS | Status: AC | PRN
  Administered 2013-05-19: 500 [IU]
  Filled 2013-05-19: qty 5

## 2013-05-19 MED ORDER — LIDOCAINE-PRILOCAINE 2.5-2.5 % EX CREA
TOPICAL_CREAM | CUTANEOUS | Status: AC
  Filled 2013-05-19: qty 5

## 2013-05-19 MED ORDER — DEXAMETHASONE SODIUM PHOSPHATE 10 MG/ML IJ SOLN
10.0000 mg | Freq: Once | INTRAMUSCULAR | Status: AC
  Administered 2013-05-19: 10 mg via INTRAVENOUS

## 2013-05-19 MED ORDER — ONDANSETRON 8 MG/NS 50 ML IVPB
INTRAVENOUS | Status: AC
  Filled 2013-05-19: qty 8

## 2013-05-19 MED ORDER — ONDANSETRON 8 MG/50ML IVPB (CHCC)
8.0000 mg | Freq: Once | INTRAVENOUS | Status: AC
  Administered 2013-05-19: 8 mg via INTRAVENOUS

## 2013-05-19 MED ORDER — DEXTROSE 5 % IV SOLN
20.0000 mg/m2 | Freq: Once | INTRAVENOUS | Status: AC
  Administered 2013-05-19: 46 mg via INTRAVENOUS
  Filled 2013-05-19: qty 23

## 2013-05-19 NOTE — Patient Instructions (Signed)
[facility] Discharge Instructions for Patients Receiving Chemotherapy  Today you received the following chemotherapy agent: Kyprolis  To help prevent nausea and vomiting after your treatment, we encourage you to take your nausea medication as prescribed.    If you develop nausea and vomiting that is not controlled by your nausea medication, call the clinic.   BELOW ARE SYMPTOMS THAT SHOULD BE REPORTED IMMEDIATELY:  *FEVER GREATER THAN 100.5 F  *CHILLS WITH OR WITHOUT FEVER  NAUSEA AND VOMITING THAT IS NOT CONTROLLED WITH YOUR NAUSEA MEDICATION  *UNUSUAL SHORTNESS OF BREATH  *UNUSUAL BRUISING OR BLEEDING  TENDERNESS IN MOUTH AND THROAT WITH OR WITHOUT PRESENCE OF ULCERS  *URINARY PROBLEMS  *BOWEL PROBLEMS  UNUSUAL RASH Items with * indicate a potential emergency and should be followed up as soon as possible.  Feel free to call the clinic you have any questions or concerns. The clinic phone number is [phone].    

## 2013-05-21 ENCOUNTER — Ambulatory Visit (HOSPITAL_BASED_OUTPATIENT_CLINIC_OR_DEPARTMENT_OTHER): Payer: Medicaid Other

## 2013-05-21 VITALS — BP 121/70 | HR 76 | Temp 97.6°F | Resp 18

## 2013-05-21 DIAGNOSIS — C9002 Multiple myeloma in relapse: Secondary | ICD-10-CM

## 2013-05-21 DIAGNOSIS — C9000 Multiple myeloma not having achieved remission: Secondary | ICD-10-CM

## 2013-05-21 DIAGNOSIS — Z5112 Encounter for antineoplastic immunotherapy: Secondary | ICD-10-CM

## 2013-05-21 MED ORDER — HEPARIN SOD (PORK) LOCK FLUSH 100 UNIT/ML IV SOLN
500.0000 [IU] | Freq: Once | INTRAVENOUS | Status: AC | PRN
  Administered 2013-05-21: 500 [IU]
  Filled 2013-05-21: qty 5

## 2013-05-21 MED ORDER — SODIUM CHLORIDE 0.9 % IV SOLN
Freq: Once | INTRAVENOUS | Status: AC
  Administered 2013-05-21: 16:00:00 via INTRAVENOUS

## 2013-05-21 MED ORDER — SODIUM CHLORIDE 0.9 % IJ SOLN
10.0000 mL | INTRAMUSCULAR | Status: DC | PRN
  Administered 2013-05-21: 10 mL
  Filled 2013-05-21: qty 10

## 2013-05-21 MED ORDER — DEXAMETHASONE SODIUM PHOSPHATE 10 MG/ML IJ SOLN
INTRAMUSCULAR | Status: AC
  Filled 2013-05-21: qty 1

## 2013-05-21 MED ORDER — DEXAMETHASONE SODIUM PHOSPHATE 10 MG/ML IJ SOLN
10.0000 mg | Freq: Once | INTRAMUSCULAR | Status: AC
  Administered 2013-05-21: 10 mg via INTRAVENOUS

## 2013-05-21 MED ORDER — DEXTROSE 5 % IV SOLN
20.0000 mg/m2 | Freq: Once | INTRAVENOUS | Status: AC
  Administered 2013-05-21: 46 mg via INTRAVENOUS
  Filled 2013-05-21: qty 23

## 2013-05-21 MED ORDER — ONDANSETRON 8 MG/50ML IVPB (CHCC)
8.0000 mg | Freq: Once | INTRAVENOUS | Status: AC
  Administered 2013-05-21: 8 mg via INTRAVENOUS

## 2013-05-21 MED ORDER — ONDANSETRON 8 MG/NS 50 ML IVPB
INTRAVENOUS | Status: AC
  Filled 2013-05-21: qty 8

## 2013-05-21 NOTE — Patient Instructions (Signed)
[facility] Discharge Instructions for Patients Receiving Chemotherapy  Today you received the following chemotherapy agent: Kyprolis  To help prevent nausea and vomiting after your treatment, we encourage you to take your nausea medication as prescribed.    If you develop nausea and vomiting that is not controlled by your nausea medication, call the clinic.   BELOW ARE SYMPTOMS THAT SHOULD BE REPORTED IMMEDIATELY:  *FEVER GREATER THAN 100.5 F  *CHILLS WITH OR WITHOUT FEVER  NAUSEA AND VOMITING THAT IS NOT CONTROLLED WITH YOUR NAUSEA MEDICATION  *UNUSUAL SHORTNESS OF BREATH  *UNUSUAL BRUISING OR BLEEDING  TENDERNESS IN MOUTH AND THROAT WITH OR WITHOUT PRESENCE OF ULCERS  *URINARY PROBLEMS  *BOWEL PROBLEMS  UNUSUAL RASH Items with * indicate a potential emergency and should be followed up as soon as possible.  Feel free to call the clinic you have any questions or concerns. The clinic phone number is [phone].    

## 2013-05-26 ENCOUNTER — Other Ambulatory Visit (HOSPITAL_BASED_OUTPATIENT_CLINIC_OR_DEPARTMENT_OTHER): Payer: Medicaid Other | Admitting: Lab

## 2013-05-26 ENCOUNTER — Ambulatory Visit (HOSPITAL_BASED_OUTPATIENT_CLINIC_OR_DEPARTMENT_OTHER): Payer: Medicaid Other

## 2013-05-26 VITALS — BP 136/80 | HR 73 | Temp 98.3°F

## 2013-05-26 DIAGNOSIS — C9000 Multiple myeloma not having achieved remission: Secondary | ICD-10-CM

## 2013-05-26 DIAGNOSIS — C9002 Multiple myeloma in relapse: Secondary | ICD-10-CM

## 2013-05-26 DIAGNOSIS — Z5112 Encounter for antineoplastic immunotherapy: Secondary | ICD-10-CM

## 2013-05-26 LAB — CBC WITH DIFFERENTIAL/PLATELET
BASO%: 0.2 % (ref 0.0–2.0)
EOS%: 0.2 % (ref 0.0–7.0)
HCT: 37.4 % (ref 34.8–46.6)
HGB: 12.2 g/dL (ref 11.6–15.9)
LYMPH%: 30.4 % (ref 14.0–49.7)
MCH: 28.8 pg (ref 25.1–34.0)
MCHC: 32.6 g/dL (ref 31.5–36.0)
MCV: 88.4 fL (ref 79.5–101.0)
NEUT#: 3 10*3/uL (ref 1.5–6.5)
NEUT%: 61.2 % (ref 38.4–76.8)
Platelets: 140 10*3/uL — ABNORMAL LOW (ref 145–400)

## 2013-05-26 LAB — COMPREHENSIVE METABOLIC PANEL (CC13)
AST: 20 U/L (ref 5–34)
Alkaline Phosphatase: 55 U/L (ref 40–150)
BUN: 7.1 mg/dL (ref 7.0–26.0)
Calcium: 9.1 mg/dL (ref 8.4–10.4)
Creatinine: 0.8 mg/dL (ref 0.6–1.1)
Glucose: 88 mg/dl (ref 70–140)

## 2013-05-26 MED ORDER — DEXAMETHASONE SODIUM PHOSPHATE 10 MG/ML IJ SOLN
INTRAMUSCULAR | Status: AC
  Filled 2013-05-26: qty 1

## 2013-05-26 MED ORDER — SODIUM CHLORIDE 0.9 % IV SOLN
Freq: Once | INTRAVENOUS | Status: AC
  Administered 2013-05-26: 16:00:00 via INTRAVENOUS

## 2013-05-26 MED ORDER — SODIUM CHLORIDE 0.9 % IJ SOLN
10.0000 mL | INTRAMUSCULAR | Status: DC | PRN
  Administered 2013-05-26: 10 mL
  Filled 2013-05-26: qty 10

## 2013-05-26 MED ORDER — ONDANSETRON 8 MG/NS 50 ML IVPB
INTRAVENOUS | Status: AC
  Filled 2013-05-26: qty 8

## 2013-05-26 MED ORDER — ONDANSETRON 8 MG/50ML IVPB (CHCC)
8.0000 mg | Freq: Once | INTRAVENOUS | Status: AC
  Administered 2013-05-26: 8 mg via INTRAVENOUS

## 2013-05-26 MED ORDER — SODIUM CHLORIDE 0.9 % IV SOLN: Freq: Once | INTRAVENOUS | Status: DC

## 2013-05-26 MED ORDER — DEXAMETHASONE SODIUM PHOSPHATE 10 MG/ML IJ SOLN
10.0000 mg | Freq: Once | INTRAMUSCULAR | Status: AC
  Administered 2013-05-26: 10 mg via INTRAVENOUS

## 2013-05-26 MED ORDER — DEXTROSE 5 % IV SOLN
20.0000 mg/m2 | Freq: Once | INTRAVENOUS | Status: AC
  Administered 2013-05-26: 46 mg via INTRAVENOUS
  Filled 2013-05-26: qty 23

## 2013-05-26 MED ORDER — HEPARIN SOD (PORK) LOCK FLUSH 100 UNIT/ML IV SOLN
500.0000 [IU] | Freq: Once | INTRAVENOUS | Status: AC | PRN
Start: 2013-05-26 — End: 2013-05-26
  Administered 2013-05-26: 500 [IU]
  Filled 2013-05-26: qty 5

## 2013-05-26 NOTE — Patient Instructions (Signed)
Carfilzomib injection What is this medicine? CARFILZOMIB (kar FILZ oh mib) is a chemotherapy drug that works by slowing or stopping cancer cell growth. This medicine is used to treat multiple myeloma. This medicine may be used for other purposes; ask your health care provider or pharmacist if you have questions. COMMON BRAND NAME(S): KYPROLIS What should I tell my health care provider before I take this medicine? They need to know if you have any of these conditions: -heart disease -irregular heartbeat -liver disease -lung or breathing disease -an unusual or allergic reaction to carfilzomib, or other medicines, foods, dyes, or preservatives -pregnant or trying to get pregnant -breast-feeding How should I use this medicine? This medicine is for injection or infusion into a vein. It is given by a health care professional in a hospital or clinic setting. Talk to your pediatrician regarding the use of this medicine in children. Special care may be needed. Overdosage: If you think you've taken too much of this medicine contact a poison control center or emergency room at once. Overdosage: If you think you have taken too much of this medicine contact a poison control center or emergency room at once. NOTE: This medicine is only for you. Do not share this medicine with others. What if I miss a dose? It is important not to miss your dose. Call your doctor or health care professional if you are unable to keep an appointment. What may interact with this medicine? Interactions are not expected. Give your health care provider a list of all the medicines, herbs, non-prescription drugs, or dietary supplements you use. Also tell them if you smoke, drink alcohol, or use illegal drugs. Some items may interact with your medicine. This list may not describe all possible interactions. Give your health care provider a list of all the medicines, herbs, non-prescription drugs, or dietary supplements you use. Also  tell them if you smoke, drink alcohol, or use illegal drugs. Some items may interact with your medicine. What should I watch for while using this medicine? Your condition will be monitored carefully while you are receiving this medicine. Report any side effects. Continue your course of treatment even though you feel ill unless your doctor tells you to stop. Call your doctor or health care professional for advice if you get a fever, chills or sore throat, or other symptoms of a cold or flu. Do not treat yourself. Try to avoid being around people who are sick. Do not become pregnant while taking this medicine. Women should inform their doctor if they wish to become pregnant or think they might be pregnant. There is a potential for serious side effects to an unborn child. Talk to your health care professional or pharmacist for more information. Do not breast-feed an infant while taking this medicine. Check with your doctor or health care professional if you get an attack of severe diarrhea, nausea and vomiting, or if you sweat a lot. The loss of too much body fluid can make it dangerous for you to take this medicine. You may get dizzy. Do not drive, use machinery, or do anything that needs mental alertness until you know how this medicine affects you. Do not stand or sit up quickly, especially if you are an older patient. This reduces the risk of dizzy or fainting spells. What side effects may I notice from receiving this medicine? Side effects that you should report to your doctor or health care professional as soon as possible: -allergic reactions like skin rash, itching or hives,   swelling of the face, lips, or tongue -breathing problems -chest pain or palpitationschest tightness -cough -dark urine -dizziness -feeling faint or lightheaded -fever or chills -general ill feeling or flu-like symptoms -light-colored stools -palpitations -right upper belly pain -swelling of the legs or ankles -unusual  bleeding or bruising -unusually weak or tired -yellowing of the eyes or skin  Side effects that usually do not require medical attention (Report these to your doctor or health care professional if they continue or are bothersome.): -diarrhea -headache -nausea, vomiting -tiredness This list may not describe all possible side effects. Call your doctor for medical advice about side effects. You may report side effects to FDA at 1-800-FDA-1088. Where should I keep my medicine? This drug is given in a hospital or clinic and will not be stored at home. NOTE: This sheet is a summary. It may not cover all possible information. If you have questions about this medicine, talk to your doctor, pharmacist, or health care provider.  2014, [employer]vier/Gold Standard. (2011-11-29 17:02:29)  

## 2013-05-27 ENCOUNTER — Ambulatory Visit (HOSPITAL_BASED_OUTPATIENT_CLINIC_OR_DEPARTMENT_OTHER): Payer: Medicaid Other

## 2013-05-27 VITALS — BP 122/46 | HR 72 | Temp 98.1°F | Resp 18

## 2013-05-27 DIAGNOSIS — Z5112 Encounter for antineoplastic immunotherapy: Secondary | ICD-10-CM

## 2013-05-27 DIAGNOSIS — C9002 Multiple myeloma in relapse: Secondary | ICD-10-CM

## 2013-05-27 DIAGNOSIS — C9000 Multiple myeloma not having achieved remission: Secondary | ICD-10-CM

## 2013-05-27 LAB — KAPPA/LAMBDA LIGHT CHAINS: Lambda Free Lght Chn: 0.04 mg/dL — ABNORMAL LOW (ref 0.57–2.63)

## 2013-05-27 MED ORDER — ONDANSETRON 8 MG/NS 50 ML IVPB
INTRAVENOUS | Status: AC
  Filled 2013-05-27: qty 8

## 2013-05-27 MED ORDER — SODIUM CHLORIDE 0.9 % IV SOLN
Freq: Once | INTRAVENOUS | Status: AC
  Administered 2013-05-27: 15:00:00 via INTRAVENOUS

## 2013-05-27 MED ORDER — SODIUM CHLORIDE 0.9 % IJ SOLN
10.0000 mL | INTRAMUSCULAR | Status: DC | PRN
  Administered 2013-05-27: 10 mL
  Filled 2013-05-27: qty 10

## 2013-05-27 MED ORDER — DEXTROSE 5 % IV SOLN
20.0000 mg/m2 | Freq: Once | INTRAVENOUS | Status: AC
  Administered 2013-05-27: 46 mg via INTRAVENOUS
  Filled 2013-05-27: qty 23

## 2013-05-27 MED ORDER — HEPARIN SOD (PORK) LOCK FLUSH 100 UNIT/ML IV SOLN
500.0000 [IU] | Freq: Once | INTRAVENOUS | Status: AC | PRN
  Administered 2013-05-27: 500 [IU]
  Filled 2013-05-27: qty 5

## 2013-05-27 MED ORDER — DEXAMETHASONE SODIUM PHOSPHATE 10 MG/ML IJ SOLN
10.0000 mg | Freq: Once | INTRAMUSCULAR | Status: AC
  Administered 2013-05-27: 10 mg via INTRAVENOUS

## 2013-05-27 MED ORDER — DEXAMETHASONE SODIUM PHOSPHATE 10 MG/ML IJ SOLN
INTRAMUSCULAR | Status: AC
  Filled 2013-05-27: qty 1

## 2013-05-27 MED ORDER — ONDANSETRON 8 MG/50ML IVPB (CHCC)
8.0000 mg | Freq: Once | INTRAVENOUS | Status: AC
  Administered 2013-05-27: 8 mg via INTRAVENOUS

## 2013-05-27 NOTE — Patient Instructions (Signed)
[facility] Discharge Instructions for Patients Receiving Chemotherapy  Today you received the following chemotherapy agents Kyprolis To help prevent nausea and vomiting after your treatment, we encourage you to take your nausea medication as prescribed.  If you develop nausea and vomiting that is not controlled by your nausea medication, call the clinic.   BELOW ARE SYMPTOMS THAT SHOULD BE REPORTED IMMEDIATELY:  *FEVER GREATER THAN 100.5 F  *CHILLS WITH OR WITHOUT FEVER  NAUSEA AND VOMITING THAT IS NOT CONTROLLED WITH YOUR NAUSEA MEDICATION  *UNUSUAL SHORTNESS OF BREATH  *UNUSUAL BRUISING OR BLEEDING  TENDERNESS IN MOUTH AND THROAT WITH OR WITHOUT PRESENCE OF ULCERS  *URINARY PROBLEMS  *BOWEL PROBLEMS  UNUSUAL RASH Items with * indicate a potential emergency and should be followed up as soon as possible.  Feel free to call the clinic you have any questions or concerns. The clinic phone number is [phone].    

## 2013-06-02 ENCOUNTER — Telehealth: Payer: Self-pay | Admitting: *Deleted

## 2013-06-02 NOTE — Telephone Encounter (Signed)
Attempted to call patient, no answer 

## 2013-06-09 ENCOUNTER — Ambulatory Visit (HOSPITAL_BASED_OUTPATIENT_CLINIC_OR_DEPARTMENT_OTHER): Payer: Medicaid Other | Admitting: Oncology

## 2013-06-09 ENCOUNTER — Other Ambulatory Visit (HOSPITAL_BASED_OUTPATIENT_CLINIC_OR_DEPARTMENT_OTHER): Payer: Medicaid Other

## 2013-06-09 ENCOUNTER — Telehealth: Payer: Self-pay | Admitting: Oncology

## 2013-06-09 ENCOUNTER — Ambulatory Visit (HOSPITAL_BASED_OUTPATIENT_CLINIC_OR_DEPARTMENT_OTHER): Payer: Medicaid Other

## 2013-06-09 DIAGNOSIS — M25559 Pain in unspecified hip: Secondary | ICD-10-CM

## 2013-06-09 DIAGNOSIS — C9002 Multiple myeloma in relapse: Secondary | ICD-10-CM

## 2013-06-09 DIAGNOSIS — Z5112 Encounter for antineoplastic immunotherapy: Secondary | ICD-10-CM

## 2013-06-09 DIAGNOSIS — C9000 Multiple myeloma not having achieved remission: Secondary | ICD-10-CM

## 2013-06-09 DIAGNOSIS — I10 Essential (primary) hypertension: Secondary | ICD-10-CM

## 2013-06-09 DIAGNOSIS — D649 Anemia, unspecified: Secondary | ICD-10-CM

## 2013-06-09 DIAGNOSIS — Z86718 Personal history of other venous thrombosis and embolism: Secondary | ICD-10-CM

## 2013-06-09 LAB — COMPREHENSIVE METABOLIC PANEL (CC13)
ALT: 16 U/L (ref 0–55)
AST: 28 U/L (ref 5–34)
Albumin: 3.2 g/dL — ABNORMAL LOW (ref 3.5–5.0)
Alkaline Phosphatase: 55 U/L (ref 40–150)
BUN: 8.3 mg/dL (ref 7.0–26.0)
Calcium: 9.7 mg/dL (ref 8.4–10.4)
Chloride: 107 mEq/L (ref 98–109)
Creatinine: 1 mg/dL (ref 0.6–1.1)
Potassium: 3.9 mEq/L (ref 3.5–5.1)
Total Protein: 6.3 g/dL — ABNORMAL LOW (ref 6.4–8.3)

## 2013-06-09 LAB — CBC WITH DIFFERENTIAL/PLATELET
BASO%: 1.5 % (ref 0.0–2.0)
EOS%: 0.4 % (ref 0.0–7.0)
HCT: 39.3 % (ref 34.8–46.6)
MCH: 29.2 pg (ref 25.1–34.0)
MCHC: 32 g/dL (ref 31.5–36.0)
MCV: 91.3 fL (ref 79.5–101.0)
MONO#: 0.4 10*3/uL (ref 0.1–0.9)
MONO%: 12.7 % (ref 0.0–14.0)
NEUT%: 51.5 % (ref 38.4–76.8)
Platelets: 145 10*3/uL (ref 145–400)
RDW: 16.6 % — ABNORMAL HIGH (ref 11.2–14.5)
lymph#: 1.2 10*3/uL (ref 0.9–3.3)

## 2013-06-09 MED ORDER — SODIUM CHLORIDE 0.9 % IV SOLN
Freq: Once | INTRAVENOUS | Status: AC
  Administered 2013-06-09: 16:00:00 via INTRAVENOUS

## 2013-06-09 MED ORDER — HEPARIN SOD (PORK) LOCK FLUSH 100 UNIT/ML IV SOLN
500.0000 [IU] | Freq: Once | INTRAVENOUS | Status: AC | PRN
  Administered 2013-06-09: 500 [IU]
  Filled 2013-06-09: qty 5

## 2013-06-09 MED ORDER — SODIUM CHLORIDE 0.9 % IV SOLN
Freq: Once | INTRAVENOUS | Status: AC
  Administered 2013-06-09: 17:00:00 via INTRAVENOUS

## 2013-06-09 MED ORDER — SODIUM CHLORIDE 0.9 % IJ SOLN
10.0000 mL | INTRAMUSCULAR | Status: DC | PRN
  Administered 2013-06-09: 10 mL
  Filled 2013-06-09: qty 10

## 2013-06-09 MED ORDER — LIDOCAINE-PRILOCAINE 2.5-2.5 % EX CREA
TOPICAL_CREAM | CUTANEOUS | Status: AC
  Filled 2013-06-09: qty 5

## 2013-06-09 MED ORDER — ONDANSETRON 8 MG/NS 50 ML IVPB
INTRAVENOUS | Status: AC
  Filled 2013-06-09: qty 8

## 2013-06-09 MED ORDER — DEXTROSE 5 % IV SOLN
20.0000 mg/m2 | Freq: Once | INTRAVENOUS | Status: AC
  Administered 2013-06-09: 46 mg via INTRAVENOUS
  Filled 2013-06-09: qty 23

## 2013-06-09 MED ORDER — ONDANSETRON 8 MG/50ML IVPB (CHCC)
8.0000 mg | Freq: Once | INTRAVENOUS | Status: AC
  Administered 2013-06-09: 8 mg via INTRAVENOUS

## 2013-06-09 MED ORDER — DEXAMETHASONE SODIUM PHOSPHATE 10 MG/ML IJ SOLN
10.0000 mg | Freq: Once | INTRAMUSCULAR | Status: AC
  Administered 2013-06-09: 10 mg via INTRAVENOUS

## 2013-06-09 MED ORDER — DEXAMETHASONE SODIUM PHOSPHATE 10 MG/ML IJ SOLN
INTRAMUSCULAR | Status: AC
  Filled 2013-06-09: qty 1

## 2013-06-09 NOTE — Progress Notes (Signed)
Hematology and Oncology Follow Up Visit  [name] [id number] 1951/06/08 62 y.o. 06/09/2013 2:56 PM      Principle Diagnosis:  This is a 62 year old female with the following issues:   1. 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  She had a free kappa light chain around 1500. 2. Diagnosis of deep vein thrombosis in July 2011.   Prior Therapy:    1. Initially treated with melphalan and prednisone.  She had a partial response with free light chains down as low as 19, M spike down to 0.52 g/dL.  2. Patient treated with Revlimid at 25 mg 3 weeks on and 1 week off between November 2009 until November 2010.  She had a partial response, light chains down to 33 and M spike was not detected.   3. She was treated on maintenance Revlimid 15 mg to maintain her partial response. 4. She was on Zometa intermittently, on hold know. 5. Currently anticoagulated with Coumadin 7.5 mg since July 2011. 6. She received Velcade 1.3 mg/sq m for a total of 2.9 mg weekly, which started on 02/21/2011 with weekly dexamethasone .  Treatment has been on hold due to recurrent hospitalization for wound infection. Treatment restarted in in 06/2011 till 09/2011. 7. Kyprolis started on 02/04/12. She completed 6 months of therapy on 07/08/12  Current therapy: Resumed Kyprolis on 11/25/12. She is here for cycle 14 day 1.   Interim History:  [name] presents today for routine followup prior to her next cycle of chemotherapy.  She has been well since her last visit. Her pain is better with Fentanyl patch to 100 mcg/hr. No neurological symptoms. She is able to ambulate without any difficulty. She uses less oxycodone for breakthrough pain now but needs higher dose at night time. She is still ambulating without problems.She is tolerating chemotherapy well. She reports grade one sensory neuropathy without any interference with her  function. She is reporting more and more fatigue as of late as well as weight loss and change in her appetite.  Medications: I have reviewed the patient's current medications.  Allergies:  Allergies  Allergen Reactions  . Ibuprofen Other (See Comments)    Patient does not remember.   . Morphine And Related Other (See Comments)    Patient does not remember reaction, believes it caused raised marks on skin.  . Other Rash    Tegaderm dressing    Past Medical History, Surgical history, Social history, and Family History were reviewed and updated.  Review of Systems:  Remaining ROS negative.  Physical Exam: There were no vitals taken for this visit. ECOG: 1 General appearance: alert Head: Normocephalic, without obvious abnormality, atraumatic Neck: no adenopathy, no carotid bruit, no JVD, supple, symmetrical, trachea midline and thyroid not enlarged, symmetric, no tenderness/mass/nodules Lymph nodes: Cervical, supraclavicular, and axillary nodes normal. Heart:regular rate and rhythm, S1, S2 normal, no murmur, click, rub or gallop Lung:chest clear, no wheezing, rales, normal symmetric air entry, Heart exam - S1, S2 normal, no murmur, no gallop, rate regular Abdomen: soft, non-tender, without masses or organomegaly. Wound appear have healed. EXT:no erythema, induration, or nodules. 2-3+ bilateral edema in the ankles/feet. Neurological: No deficits detected with limited exam.   Lab Results: Lab Results  Component Value Date   WBC 3.4* 06/09/2013   HGB 12.6 06/09/2013   HCT 39.3 06/09/2013   MCV 91.3 06/09/2013   PLT 145 06/09/2013  Chemistry      Component Value Date/Time   NA 148* 05/26/2013 1526   NA 142 01/16/2013 1720   K 3.7 05/26/2013 1526   K 3.6 01/16/2013 1720   CL 110 01/16/2013 1720   CL 110* 12/02/2012 1444   CO2 25 05/26/2013 1526   CO2 26 01/16/2013 1720   BUN 7.1 05/26/2013 1526   BUN 9 01/16/2013 1720   CREATININE 0.8 05/26/2013 1526   CREATININE 0.94  01/16/2013 1720      Component Value Date/Time   CALCIUM 9.1 05/26/2013 1526   CALCIUM 9.6 01/16/2013 1720   ALKPHOS 55 05/26/2013 1526   ALKPHOS 57 02/11/2012 1445   AST 20 05/26/2013 1526   AST 15 02/11/2012 1445   ALT 14 05/26/2013 1526   ALT 12 02/11/2012 1445   BILITOT 1.01 05/26/2013 1526   BILITOT 0.5 02/11/2012 1445      Results for [name] (MRN [id number]) as of 06/09/2013 14:47  Ref. Range 02/17/2013 14:48 03/17/2013 14:40 04/14/2013 15:14 05/26/2013 15:26  Kappa free light chain Latest Range: 0.33-1.94 mg/dL 409.81 (H) 191.47 (H) 829.56 (H) 193.00 (H)      Impression and Plan:  This is a 62 year old female with the following issues:  1. Light chain multiple myeloma. She is doing very well on restart of Kyprolis since June of 2014. However, she is accumulating more toxicities and had an excellent decline in her kappa free light chain by close to 75%. The plan is to proceed with the next cycle of chemotherapy and give her a treatment break in January of 2015 and presumably restart therapy then. 2. Hypercalcemia. Calcium is normal today. She will receive Zometa every 8 weeks last treatment in 04/28/2014. This will be repeated in 06/2013. 3. Pain in her hips/legs: Better with Fentanyl to 100 mcg/hr. 4. Anemia, multifactorial. Hemoglobin stable. 5. History of deep vein thrombosis. Previously anticoagulated with Coumadin. No new clots noted. No more anticoagulation at this time.  6. Hypertension seems to be under relatively good control.  7. LE edema. Resolved now  8. IV access: PAC in place.  9. Follow up. In 6 weeks.    [name] 12/17/20142:56 PM

## 2013-06-09 NOTE — Telephone Encounter (Signed)
appts made per 12/17 POF Email to Dr. Clelia Croft asking for [facility] on 01/29 AVS and CAL given shh

## 2013-06-10 ENCOUNTER — Ambulatory Visit: Payer: Medicaid Other

## 2013-06-10 LAB — KAPPA/LAMBDA LIGHT CHAINS: Lambda Free Lght Chn: 0.04 mg/dL — ABNORMAL LOW (ref 0.57–2.63)

## 2013-06-11 ENCOUNTER — Ambulatory Visit (HOSPITAL_BASED_OUTPATIENT_CLINIC_OR_DEPARTMENT_OTHER): Payer: Medicaid Other

## 2013-06-11 VITALS — BP 137/60 | HR 81 | Temp 98.4°F | Resp 20

## 2013-06-11 DIAGNOSIS — C9000 Multiple myeloma not having achieved remission: Secondary | ICD-10-CM

## 2013-06-11 DIAGNOSIS — C9002 Multiple myeloma in relapse: Secondary | ICD-10-CM

## 2013-06-11 DIAGNOSIS — Z5112 Encounter for antineoplastic immunotherapy: Secondary | ICD-10-CM

## 2013-06-11 MED ORDER — HEPARIN SOD (PORK) LOCK FLUSH 100 UNIT/ML IV SOLN
500.0000 [IU] | Freq: Once | INTRAVENOUS | Status: AC | PRN
  Administered 2013-06-11: 500 [IU]
  Filled 2013-06-11: qty 5

## 2013-06-11 MED ORDER — DEXTROSE 5 % IV SOLN
20.0000 mg/m2 | Freq: Once | INTRAVENOUS | Status: AC
  Administered 2013-06-11: 46 mg via INTRAVENOUS
  Filled 2013-06-11: qty 23

## 2013-06-11 MED ORDER — SODIUM CHLORIDE 0.9 % IV SOLN
Freq: Once | INTRAVENOUS | Status: AC
  Administered 2013-06-11: 15:00:00 via INTRAVENOUS

## 2013-06-11 MED ORDER — SODIUM CHLORIDE 0.9 % IV SOLN: Freq: Once | INTRAVENOUS | Status: DC

## 2013-06-11 MED ORDER — SODIUM CHLORIDE 0.9 % IJ SOLN
10.0000 mL | INTRAMUSCULAR | Status: DC | PRN
  Administered 2013-06-11: 10 mL
  Filled 2013-06-11: qty 10

## 2013-06-11 MED ORDER — ONDANSETRON 8 MG/NS 50 ML IVPB
INTRAVENOUS | Status: AC
  Filled 2013-06-11: qty 8

## 2013-06-11 MED ORDER — DEXAMETHASONE SODIUM PHOSPHATE 10 MG/ML IJ SOLN
10.0000 mg | Freq: Once | INTRAMUSCULAR | Status: AC
  Administered 2013-06-11: 10 mg via INTRAVENOUS

## 2013-06-11 MED ORDER — DEXAMETHASONE SODIUM PHOSPHATE 10 MG/ML IJ SOLN
INTRAMUSCULAR | Status: AC
  Filled 2013-06-11: qty 1

## 2013-06-11 MED ORDER — ONDANSETRON 8 MG/50ML IVPB (CHCC)
8.0000 mg | Freq: Once | INTRAVENOUS | Status: AC
  Administered 2013-06-11: 8 mg via INTRAVENOUS

## 2013-06-11 NOTE — Patient Instructions (Signed)
[facility] Discharge Instructions for Patients Receiving Chemotherapy  Today you received the following chemotherapy agents Kyprolis To help prevent nausea and vomiting after your treatment, we encourage you to take your nausea medication as prescribed.  If you develop nausea and vomiting that is not controlled by your nausea medication, call the clinic.   BELOW ARE SYMPTOMS THAT SHOULD BE REPORTED IMMEDIATELY:  *FEVER GREATER THAN 100.5 F  *CHILLS WITH OR WITHOUT FEVER  NAUSEA AND VOMITING THAT IS NOT CONTROLLED WITH YOUR NAUSEA MEDICATION  *UNUSUAL SHORTNESS OF BREATH  *UNUSUAL BRUISING OR BLEEDING  TENDERNESS IN MOUTH AND THROAT WITH OR WITHOUT PRESENCE OF ULCERS  *URINARY PROBLEMS  *BOWEL PROBLEMS  UNUSUAL RASH Items with * indicate a potential emergency and should be followed up as soon as possible.  Feel free to call the clinic you have any questions or concerns. The clinic phone number is [phone].    

## 2013-06-16 ENCOUNTER — Other Ambulatory Visit: Payer: Medicaid Other

## 2013-06-16 ENCOUNTER — Telehealth: Payer: Self-pay | Admitting: Medical Oncology

## 2013-06-16 ENCOUNTER — Ambulatory Visit: Payer: Medicaid Other

## 2013-06-16 NOTE — Telephone Encounter (Signed)
Patient called reporting diarrhea that started last night, denies any other symptoms. Patient with treatment appt today but she cancelled that d/t diarrhea. I encouraged patient to come to appt for possible IV fluids. Patient states she no longer has transportation available. Reports 4+ episodes since last night, states cannot recall exact number of times. Reviewed with on-call MD, Dr Bertis Ruddy and informed patient to increase fluid intake to replace lost fluid, stop lasix till diarrhea improves and for patient to take immodium OTC. Patient verbalized understanding, no further questions at this time. Knows to call office/clinic with further questions or concerns. Confirmed appt for 06/18/13.

## 2013-06-18 ENCOUNTER — Telehealth: Payer: Self-pay | Admitting: Medical Oncology

## 2013-06-18 ENCOUNTER — Ambulatory Visit (HOSPITAL_BASED_OUTPATIENT_CLINIC_OR_DEPARTMENT_OTHER): Payer: Medicaid Other

## 2013-06-18 ENCOUNTER — Other Ambulatory Visit: Payer: Self-pay | Admitting: Medical Oncology

## 2013-06-18 VITALS — BP 131/73 | HR 102 | Temp 98.3°F | Resp 20

## 2013-06-18 DIAGNOSIS — C9002 Multiple myeloma in relapse: Secondary | ICD-10-CM

## 2013-06-18 DIAGNOSIS — C9000 Multiple myeloma not having achieved remission: Secondary | ICD-10-CM

## 2013-06-18 DIAGNOSIS — Z5112 Encounter for antineoplastic immunotherapy: Secondary | ICD-10-CM

## 2013-06-18 LAB — COMPREHENSIVE METABOLIC PANEL (CC13)
ALT: 15 U/L (ref 0–55)
AST: 22 U/L (ref 5–34)
Albumin: 3.1 g/dL — ABNORMAL LOW (ref 3.5–5.0)
Calcium: 9.1 mg/dL (ref 8.4–10.4)
Chloride: 112 mEq/L — ABNORMAL HIGH (ref 98–109)
Creatinine: 0.9 mg/dL (ref 0.6–1.1)
Potassium: 3.8 mEq/L (ref 3.5–5.1)
Sodium: 147 mEq/L — ABNORMAL HIGH (ref 136–145)

## 2013-06-18 LAB — CBC WITH DIFFERENTIAL/PLATELET
BASO%: 1 % (ref 0.0–2.0)
Basophils Absolute: 0 10*3/uL (ref 0.0–0.1)
EOS%: 0.3 % (ref 0.0–7.0)
HCT: 38.6 % (ref 34.8–46.6)
MCH: 29.2 pg (ref 25.1–34.0)
MCHC: 32.1 g/dL (ref 31.5–36.0)
MONO%: 15.4 % — ABNORMAL HIGH (ref 0.0–14.0)
NEUT%: 57.3 % (ref 38.4–76.8)
RBC: 4.25 10*6/uL (ref 3.70–5.45)
RDW: 16.2 % — ABNORMAL HIGH (ref 11.2–14.5)
lymph#: 1.3 10*3/uL (ref 0.9–3.3)

## 2013-06-18 MED ORDER — DEXAMETHASONE SODIUM PHOSPHATE 10 MG/ML IJ SOLN
10.0000 mg | Freq: Once | INTRAMUSCULAR | Status: AC
  Administered 2013-06-18: 10 mg via INTRAVENOUS

## 2013-06-18 MED ORDER — ONDANSETRON 8 MG/50ML IVPB (CHCC)
8.0000 mg | Freq: Once | INTRAVENOUS | Status: AC
  Administered 2013-06-18: 8 mg via INTRAVENOUS

## 2013-06-18 MED ORDER — SODIUM CHLORIDE 0.9 % IV SOLN
Freq: Once | INTRAVENOUS | Status: AC
  Administered 2013-06-18: 15:00:00 via INTRAVENOUS

## 2013-06-18 MED ORDER — HEPARIN SOD (PORK) LOCK FLUSH 100 UNIT/ML IV SOLN
500.0000 [IU] | Freq: Once | INTRAVENOUS | Status: AC | PRN
  Administered 2013-06-18: 500 [IU]
  Filled 2013-06-18: qty 5

## 2013-06-18 MED ORDER — SODIUM CHLORIDE 0.9 % IJ SOLN
10.0000 mL | INTRAMUSCULAR | Status: DC | PRN
  Administered 2013-06-18: 10 mL
  Filled 2013-06-18: qty 10

## 2013-06-18 MED ORDER — DEXTROSE 5 % IV SOLN
20.0000 mg/m2 | Freq: Once | INTRAVENOUS | Status: AC
  Administered 2013-06-18: 46 mg via INTRAVENOUS
  Filled 2013-06-18: qty 23

## 2013-06-18 MED ORDER — DEXAMETHASONE SODIUM PHOSPHATE 10 MG/ML IJ SOLN
INTRAMUSCULAR | Status: AC
  Filled 2013-06-18: qty 1

## 2013-06-18 MED ORDER — ONDANSETRON 8 MG/NS 50 ML IVPB
INTRAVENOUS | Status: AC
  Filled 2013-06-18: qty 8

## 2013-06-18 NOTE — Patient Instructions (Signed)
[facility] Discharge Instructions for Patients Receiving Chemotherapy  Today you received the following chemotherapy agents Kyprolis To help prevent nausea and vomiting after your treatment, we encourage you to take your nausea medication as prescribed.  If you develop nausea and vomiting that is not controlled by your nausea medication, call the clinic.   BELOW ARE SYMPTOMS THAT SHOULD BE REPORTED IMMEDIATELY:  *FEVER GREATER THAN 100.5 F  *CHILLS WITH OR WITHOUT FEVER  NAUSEA AND VOMITING THAT IS NOT CONTROLLED WITH YOUR NAUSEA MEDICATION  *UNUSUAL SHORTNESS OF BREATH  *UNUSUAL BRUISING OR BLEEDING  TENDERNESS IN MOUTH AND THROAT WITH OR WITHOUT PRESENCE OF ULCERS  *URINARY PROBLEMS  *BOWEL PROBLEMS  UNUSUAL RASH Items with * indicate a potential emergency and should be followed up as soon as possible.  Feel free to call the clinic you have any questions or concerns. The clinic phone number is [phone].    

## 2013-06-18 NOTE — Telephone Encounter (Signed)
Pt stated diarrhea " has gone down a lot". She will be in today for labs and possible chemo.

## 2013-06-21 ENCOUNTER — Other Ambulatory Visit: Payer: Self-pay | Admitting: *Deleted

## 2013-06-21 ENCOUNTER — Ambulatory Visit (HOSPITAL_BASED_OUTPATIENT_CLINIC_OR_DEPARTMENT_OTHER): Payer: Medicaid Other

## 2013-06-21 ENCOUNTER — Encounter (INDEPENDENT_AMBULATORY_CARE_PROVIDER_SITE_OTHER): Payer: Self-pay

## 2013-06-21 VITALS — BP 108/69 | HR 82 | Temp 97.1°F | Resp 20

## 2013-06-21 DIAGNOSIS — C9000 Multiple myeloma not having achieved remission: Secondary | ICD-10-CM

## 2013-06-21 DIAGNOSIS — C9002 Multiple myeloma in relapse: Secondary | ICD-10-CM

## 2013-06-21 DIAGNOSIS — Z5112 Encounter for antineoplastic immunotherapy: Secondary | ICD-10-CM

## 2013-06-21 MED ORDER — DEXTROSE 5 % IV SOLN
20.0000 mg/m2 | Freq: Once | INTRAVENOUS | Status: AC
  Administered 2013-06-21: 46 mg via INTRAVENOUS
  Filled 2013-06-21: qty 23

## 2013-06-21 MED ORDER — DEXAMETHASONE SODIUM PHOSPHATE 10 MG/ML IJ SOLN
10.0000 mg | Freq: Once | INTRAMUSCULAR | Status: AC
  Administered 2013-06-21: 10 mg via INTRAVENOUS

## 2013-06-21 MED ORDER — HEPARIN SOD (PORK) LOCK FLUSH 100 UNIT/ML IV SOLN
500.0000 [IU] | Freq: Once | INTRAVENOUS | Status: AC | PRN
  Administered 2013-06-21: 500 [IU]
  Filled 2013-06-21: qty 5

## 2013-06-21 MED ORDER — DEXAMETHASONE SODIUM PHOSPHATE 10 MG/ML IJ SOLN
INTRAMUSCULAR | Status: AC
  Filled 2013-06-21: qty 1

## 2013-06-21 MED ORDER — PROCHLORPERAZINE MALEATE 10 MG PO TABS: 10.0000 mg | ORAL_TABLET | Freq: Four times a day (QID) | ORAL | Status: DC | PRN

## 2013-06-21 MED ORDER — SODIUM CHLORIDE 0.9 % IJ SOLN
10.0000 mL | INTRAMUSCULAR | Status: DC | PRN
  Administered 2013-06-21: 10 mL
  Filled 2013-06-21: qty 10

## 2013-06-21 MED ORDER — SODIUM CHLORIDE 0.9 % IV SOLN
Freq: Once | INTRAVENOUS | Status: AC
  Administered 2013-06-21: 16:00:00 via INTRAVENOUS

## 2013-06-21 MED ORDER — ONDANSETRON 8 MG/50ML IVPB (CHCC)
8.0000 mg | Freq: Once | INTRAVENOUS | Status: AC
  Administered 2013-06-21: 8 mg via INTRAVENOUS

## 2013-06-21 MED ORDER — ONDANSETRON 8 MG/NS 50 ML IVPB
INTRAVENOUS | Status: AC
  Filled 2013-06-21: qty 8

## 2013-06-21 MED ORDER — SODIUM CHLORIDE 0.9 % IV SOLN: Freq: Once | INTRAVENOUS | Status: DC

## 2013-06-21 MED ORDER — ONDANSETRON HCL 8 MG PO TABS: 8.0000 mg | ORAL_TABLET | Freq: Three times a day (TID) | ORAL | Status: DC | PRN

## 2013-06-21 NOTE — Patient Instructions (Signed)
[facility] Discharge Instructions for Patients Receiving Chemotherapy  Today you received the following chemotherapy agents Kryprolis  To help prevent nausea and vomiting after your treatment, we encourage you to take your nausea medication as directed   If you develop nausea and vomiting that is not controlled by your nausea medication, call the clinic.   BELOW ARE SYMPTOMS THAT SHOULD BE REPORTED IMMEDIATELY:  *FEVER GREATER THAN 100.5 F  *CHILLS WITH OR WITHOUT FEVER  NAUSEA AND VOMITING THAT IS NOT CONTROLLED WITH YOUR NAUSEA MEDICATION  *UNUSUAL SHORTNESS OF BREATH  *UNUSUAL BRUISING OR BLEEDING  TENDERNESS IN MOUTH AND THROAT WITH OR WITHOUT PRESENCE OF ULCERS  *URINARY PROBLEMS  *BOWEL PROBLEMS  UNUSUAL RASH Items with * indicate a potential emergency and should be followed up as soon as possible.  Feel free to call the clinic you have any questions or concerns. The clinic phone number is [phone].    

## 2013-06-21 NOTE — Telephone Encounter (Signed)
Previous Rx found on printer, patient left facility already, Rx sent by escribe.

## 2013-06-23 ENCOUNTER — Ambulatory Visit (HOSPITAL_BASED_OUTPATIENT_CLINIC_OR_DEPARTMENT_OTHER): Payer: Medicaid Other

## 2013-06-23 ENCOUNTER — Other Ambulatory Visit (HOSPITAL_BASED_OUTPATIENT_CLINIC_OR_DEPARTMENT_OTHER): Payer: Medicaid Other

## 2013-06-23 ENCOUNTER — Encounter (INDEPENDENT_AMBULATORY_CARE_PROVIDER_SITE_OTHER): Payer: Self-pay

## 2013-06-23 VITALS — BP 107/63 | HR 83 | Temp 98.3°F | Resp 18

## 2013-06-23 DIAGNOSIS — C9000 Multiple myeloma not having achieved remission: Secondary | ICD-10-CM

## 2013-06-23 DIAGNOSIS — C9002 Multiple myeloma in relapse: Secondary | ICD-10-CM

## 2013-06-23 DIAGNOSIS — Z5112 Encounter for antineoplastic immunotherapy: Secondary | ICD-10-CM

## 2013-06-23 LAB — COMPREHENSIVE METABOLIC PANEL (CC13)
ALT: 16 U/L (ref 0–55)
AST: 19 U/L (ref 5–34)
Albumin: 3.5 g/dL (ref 3.5–5.0)
Alkaline Phosphatase: 50 U/L (ref 40–150)
BUN: 13.6 mg/dL (ref 7.0–26.0)
Calcium: 8.8 mg/dL (ref 8.4–10.4)
Chloride: 113 mEq/L — ABNORMAL HIGH (ref 98–109)
Potassium: 3.5 mEq/L (ref 3.5–5.1)
Total Protein: 6.3 g/dL — ABNORMAL LOW (ref 6.4–8.3)

## 2013-06-23 LAB — CBC WITH DIFFERENTIAL/PLATELET
BASO%: 0.2 % (ref 0.0–2.0)
EOS%: 0.2 % (ref 0.0–7.0)
Eosinophils Absolute: 0 10*3/uL (ref 0.0–0.5)
HCT: 38.6 % (ref 34.8–46.6)
LYMPH%: 30.2 % (ref 14.0–49.7)
MCHC: 32.6 g/dL (ref 31.5–36.0)
MCV: 87.9 fL (ref 79.5–101.0)
MONO#: 0.5 10*3/uL (ref 0.1–0.9)
NEUT#: 3.6 10*3/uL (ref 1.5–6.5)
NEUT%: 61.7 % (ref 38.4–76.8)
Platelets: 141 10*3/uL — ABNORMAL LOW (ref 145–400)
WBC: 5.8 10*3/uL (ref 3.9–10.3)
lymph#: 1.8 10*3/uL (ref 0.9–3.3)
nRBC: 3 % — ABNORMAL HIGH (ref 0–0)

## 2013-06-23 MED ORDER — SODIUM CHLORIDE 0.9 % IV SOLN
Freq: Once | INTRAVENOUS | Status: AC
  Administered 2013-06-23: 15:00:00 via INTRAVENOUS

## 2013-06-23 MED ORDER — ONDANSETRON 8 MG/50ML IVPB (CHCC)
8.0000 mg | Freq: Once | INTRAVENOUS | Status: AC
  Administered 2013-06-23: 8 mg via INTRAVENOUS

## 2013-06-23 MED ORDER — HEPARIN SOD (PORK) LOCK FLUSH 100 UNIT/ML IV SOLN
500.0000 [IU] | Freq: Once | INTRAVENOUS | Status: AC | PRN
  Administered 2013-06-23: 500 [IU]
  Filled 2013-06-23: qty 5

## 2013-06-23 MED ORDER — SODIUM CHLORIDE 0.9 % IJ SOLN
10.0000 mL | INTRAMUSCULAR | Status: DC | PRN
  Administered 2013-06-23: 10 mL
  Filled 2013-06-23: qty 10

## 2013-06-23 MED ORDER — DEXTROSE 5 % IV SOLN
20.0000 mg/m2 | Freq: Once | INTRAVENOUS | Status: AC
  Administered 2013-06-23: 46 mg via INTRAVENOUS
  Filled 2013-06-23: qty 23

## 2013-06-23 MED ORDER — ONDANSETRON 8 MG/NS 50 ML IVPB
INTRAVENOUS | Status: AC
  Filled 2013-06-23: qty 8

## 2013-06-23 MED ORDER — DEXAMETHASONE SODIUM PHOSPHATE 10 MG/ML IJ SOLN
10.0000 mg | Freq: Once | INTRAMUSCULAR | Status: AC
  Administered 2013-06-23: 10 mg via INTRAVENOUS

## 2013-06-23 MED ORDER — DEXAMETHASONE SODIUM PHOSPHATE 10 MG/ML IJ SOLN
INTRAMUSCULAR | Status: AC
  Filled 2013-06-23: qty 1

## 2013-06-23 NOTE — Patient Instructions (Signed)
[facility] Discharge Instructions for Patients Receiving Chemotherapy  Today you received the following chemotherapy agents Kyprolis To help prevent nausea and vomiting after your treatment, we encourage you to take your nausea medication as prescribed.  If you develop nausea and vomiting that is not controlled by your nausea medication, call the clinic.   BELOW ARE SYMPTOMS THAT SHOULD BE REPORTED IMMEDIATELY:  *FEVER GREATER THAN 100.5 F  *CHILLS WITH OR WITHOUT FEVER  NAUSEA AND VOMITING THAT IS NOT CONTROLLED WITH YOUR NAUSEA MEDICATION  *UNUSUAL SHORTNESS OF BREATH  *UNUSUAL BRUISING OR BLEEDING  TENDERNESS IN MOUTH AND THROAT WITH OR WITHOUT PRESENCE OF ULCERS  *URINARY PROBLEMS  *BOWEL PROBLEMS  UNUSUAL RASH Items with * indicate a potential emergency and should be followed up as soon as possible.  Feel free to call the clinic you have any questions or concerns. The clinic phone number is [phone].    

## 2013-06-25 ENCOUNTER — Ambulatory Visit (HOSPITAL_BASED_OUTPATIENT_CLINIC_OR_DEPARTMENT_OTHER): Payer: Medicaid Other

## 2013-06-25 ENCOUNTER — Encounter (INDEPENDENT_AMBULATORY_CARE_PROVIDER_SITE_OTHER): Payer: Self-pay

## 2013-06-25 VITALS — BP 145/73 | HR 88 | Temp 97.9°F | Resp 20

## 2013-06-25 DIAGNOSIS — L02211 Cutaneous abscess of abdominal wall: Secondary | ICD-10-CM

## 2013-06-25 DIAGNOSIS — K436 Other and unspecified ventral hernia with obstruction, without gangrene: Secondary | ICD-10-CM

## 2013-06-25 DIAGNOSIS — C9000 Multiple myeloma not having achieved remission: Secondary | ICD-10-CM

## 2013-06-25 DIAGNOSIS — Z5112 Encounter for antineoplastic immunotherapy: Secondary | ICD-10-CM

## 2013-06-25 DIAGNOSIS — K430 Incisional hernia with obstruction, without gangrene: Secondary | ICD-10-CM

## 2013-06-25 DIAGNOSIS — C9002 Multiple myeloma in relapse: Secondary | ICD-10-CM

## 2013-06-25 MED ORDER — DEXTROSE 5 % IV SOLN
20.0000 mg/m2 | Freq: Once | INTRAVENOUS | Status: AC
  Administered 2013-06-25: 46 mg via INTRAVENOUS
  Filled 2013-06-25: qty 23

## 2013-06-25 MED ORDER — DEXAMETHASONE SODIUM PHOSPHATE 10 MG/ML IJ SOLN
10.0000 mg | Freq: Once | INTRAMUSCULAR | Status: AC
  Administered 2013-06-25: 10 mg via INTRAVENOUS

## 2013-06-25 MED ORDER — ZOLEDRONIC ACID 4 MG/100ML IV SOLN
4.0000 mg | Freq: Once | INTRAVENOUS | Status: AC
  Administered 2013-06-25: 4 mg via INTRAVENOUS
  Filled 2013-06-25: qty 100

## 2013-06-25 MED ORDER — HEPARIN SOD (PORK) LOCK FLUSH 100 UNIT/ML IV SOLN
500.0000 [IU] | Freq: Once | INTRAVENOUS | Status: AC | PRN
  Administered 2013-06-25: 500 [IU]
  Filled 2013-06-25: qty 5

## 2013-06-25 MED ORDER — ONDANSETRON 8 MG/50ML IVPB (CHCC)
8.0000 mg | Freq: Once | INTRAVENOUS | Status: AC
  Administered 2013-06-25: 8 mg via INTRAVENOUS

## 2013-06-25 MED ORDER — DEXAMETHASONE SODIUM PHOSPHATE 10 MG/ML IJ SOLN
INTRAMUSCULAR | Status: AC
  Filled 2013-06-25: qty 1

## 2013-06-25 MED ORDER — SODIUM CHLORIDE 0.9 % IV SOLN
Freq: Once | INTRAVENOUS | Status: AC
  Administered 2013-06-25: 15:00:00 via INTRAVENOUS

## 2013-06-25 MED ORDER — SODIUM CHLORIDE 0.9 % IJ SOLN
10.0000 mL | INTRAMUSCULAR | Status: DC | PRN
  Administered 2013-06-25: 10 mL
  Filled 2013-06-25: qty 10

## 2013-06-25 MED ORDER — ONDANSETRON 8 MG/NS 50 ML IVPB
INTRAVENOUS | Status: AC
  Filled 2013-06-25: qty 8

## 2013-06-25 NOTE — Patient Instructions (Signed)
[facility] Discharge Instructions for Patients Receiving Chemotherapy  Today you received the following chemotherapy agents Kyprolis To help prevent nausea and vomiting after your treatment, we encourage you to take your nausea medication as prescribed.  If you develop nausea and vomiting that is not controlled by your nausea medication, call the clinic.   BELOW ARE SYMPTOMS THAT SHOULD BE REPORTED IMMEDIATELY:  *FEVER GREATER THAN 100.5 F  *CHILLS WITH OR WITHOUT FEVER  NAUSEA AND VOMITING THAT IS NOT CONTROLLED WITH YOUR NAUSEA MEDICATION  *UNUSUAL SHORTNESS OF BREATH  *UNUSUAL BRUISING OR BLEEDING  TENDERNESS IN MOUTH AND THROAT WITH OR WITHOUT PRESENCE OF ULCERS  *URINARY PROBLEMS  *BOWEL PROBLEMS  UNUSUAL RASH Items with * indicate a potential emergency and should be followed up as soon as possible.  Feel free to call the clinic you have any questions or concerns. The clinic phone number is [phone].    

## 2013-07-22 ENCOUNTER — Encounter: Payer: Self-pay | Admitting: Oncology

## 2013-07-22 ENCOUNTER — Other Ambulatory Visit: Payer: Medicaid Other

## 2013-07-22 ENCOUNTER — Telehealth: Payer: Self-pay | Admitting: Oncology

## 2013-07-22 ENCOUNTER — Ambulatory Visit (HOSPITAL_BASED_OUTPATIENT_CLINIC_OR_DEPARTMENT_OTHER): Payer: Medicaid Other | Admitting: Oncology

## 2013-07-22 ENCOUNTER — Other Ambulatory Visit (HOSPITAL_BASED_OUTPATIENT_CLINIC_OR_DEPARTMENT_OTHER): Payer: Medicaid Other

## 2013-07-22 VITALS — BP 152/75 | HR 109 | Temp 98.1°F | Resp 20 | Ht 65.0 in | Wt 218.8 lb

## 2013-07-22 DIAGNOSIS — C9000 Multiple myeloma not having achieved remission: Secondary | ICD-10-CM

## 2013-07-22 DIAGNOSIS — M79609 Pain in unspecified limb: Secondary | ICD-10-CM

## 2013-07-22 DIAGNOSIS — M25519 Pain in unspecified shoulder: Secondary | ICD-10-CM

## 2013-07-22 DIAGNOSIS — Z86718 Personal history of other venous thrombosis and embolism: Secondary | ICD-10-CM

## 2013-07-22 DIAGNOSIS — I10 Essential (primary) hypertension: Secondary | ICD-10-CM

## 2013-07-22 DIAGNOSIS — D649 Anemia, unspecified: Secondary | ICD-10-CM

## 2013-07-22 LAB — COMPREHENSIVE METABOLIC PANEL (CC13)
ALBUMIN: 3.1 g/dL — AB (ref 3.5–5.0)
ALT: 17 U/L (ref 0–55)
ANION GAP: 10 meq/L (ref 3–11)
AST: 30 U/L (ref 5–34)
Alkaline Phosphatase: 54 U/L (ref 40–150)
BUN: 6.5 mg/dL — AB (ref 7.0–26.0)
CO2: 28 mEq/L (ref 22–29)
Calcium: 9.1 mg/dL (ref 8.4–10.4)
Chloride: 110 mEq/L — ABNORMAL HIGH (ref 98–109)
Creatinine: 0.8 mg/dL (ref 0.6–1.1)
GLUCOSE: 76 mg/dL (ref 70–140)
Potassium: 3.4 mEq/L — ABNORMAL LOW (ref 3.5–5.1)
Sodium: 148 mEq/L — ABNORMAL HIGH (ref 136–145)
Total Bilirubin: 0.52 mg/dL (ref 0.20–1.20)
Total Protein: 6.3 g/dL — ABNORMAL LOW (ref 6.4–8.3)

## 2013-07-22 LAB — CBC WITH DIFFERENTIAL/PLATELET
BASO%: 1.2 % (ref 0.0–2.0)
BASOS ABS: 0 10*3/uL (ref 0.0–0.1)
EOS ABS: 0 10*3/uL (ref 0.0–0.5)
EOS%: 0.3 % (ref 0.0–7.0)
HCT: 40.9 % (ref 34.8–46.6)
HEMOGLOBIN: 13.1 g/dL (ref 11.6–15.9)
LYMPH%: 37.9 % (ref 14.0–49.7)
MCH: 29.2 pg (ref 25.1–34.0)
MCHC: 32 g/dL (ref 31.5–36.0)
MCV: 91.3 fL (ref 79.5–101.0)
MONO#: 0.4 10*3/uL (ref 0.1–0.9)
MONO%: 11.3 % (ref 0.0–14.0)
NEUT%: 49.3 % (ref 38.4–76.8)
NEUTROS ABS: 1.8 10*3/uL (ref 1.5–6.5)
PLATELETS: 185 10*3/uL (ref 145–400)
RBC: 4.47 10*6/uL (ref 3.70–5.45)
RDW: 16.1 % — AB (ref 11.2–14.5)
WBC: 3.6 10*3/uL — AB (ref 3.9–10.3)
lymph#: 1.4 10*3/uL (ref 0.9–3.3)

## 2013-07-22 NOTE — Progress Notes (Signed)
Hematology and Oncology Follow Up Visit  [name] [id number] 10/02/1950 63 y.o. 07/22/2013 3:19 PM      Principle Diagnosis:  This is a 63 year old female with the following issues:   1. 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  She had a free kappa light chain around 1500. 2. Diagnosis of deep vein thrombosis in July 2011.   Prior Therapy:    1. Initially treated with melphalan and prednisone.  She had a partial response with free light chains down as low as 19, M spike down to 0.52 g/dL.  2. Patient treated with Revlimid at 25 mg 3 weeks on and 1 week off between November 2009 until November 2010.  She had a partial response, light chains down to 33 and M spike was not detected.   3. She was treated on maintenance Revlimid 15 mg to maintain her partial response. 4. She was on Zometa intermittently, on hold know. 5. Currently anticoagulated with Coumadin 7.5 mg since July 2011. 6. She received Velcade 1.3 mg/sq m for a total of 2.9 mg weekly, which started on 02/21/2011 with weekly dexamethasone .  Treatment has been on hold due to recurrent hospitalization for wound infection. Treatment restarted in in 06/2011 till 09/2011. 7. Kyprolis started on 02/04/12. She completed 6 months of therapy on 07/08/12  Current therapy: Resumed Kyprolis on 11/25/12. She is S/P 14 cycles completed in 06/2013. Now on a treatment break.   Interim History:  [name] presents today for routine followup after she completed the last cycle of chemotherapy.  She has been well since her last visit. Her pain is better with Fentanyl patch to 100 mcg/hr. No neurological symptoms. She is able to ambulate without any difficulty. She uses less oxycodone for breakthrough pain now but needs higher dose at night time. She is still ambulating without problems.She is tolerating chemotherapy well. 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 She is reporting more and more fatigue as of late as well as weight loss and change in her appetite. She did report some slight weakness last month and had a couple of falls at home. She did not have any injuries and continued to be in the target without any major difficulties.  Medications: I have reviewed the patient's current medications.  Allergies:  Allergies  Allergen Reactions  . Ibuprofen Other (See Comments)    Patient does not remember.   . Morphine And Related Other (See Comments)    Patient does not remember reaction, believes it caused raised marks on skin.  . Other Rash    Tegaderm dressing    Past Medical History, Surgical history, Social history, and Family History were reviewed and updated.  Review of Systems:  Remaining ROS negative.  Physical Exam: Blood pressure 152/75, pulse 109, temperature 98.1 F (36.7 C), temperature source Oral, resp. rate 20, height _0  (1.651 m), weight 218 lb 12.8 oz (99.247 kg), SpO2 97.00%. ECOG: 1 General appearance: alert Head: Normocephalic, without obvious abnormality, atraumatic Neck: no adenopathy, no carotid bruit, no JVD, supple, symmetrical, trachea midline and thyroid not enlarged, symmetric, no tenderness/mass/nodules Lymph nodes: Cervical, supraclavicular, and axillary nodes normal. Heart:regular rate and rhythm, S1, S2 normal, no murmur, click, rub or gallop Lung:chest clear, no wheezing, rales, normal symmetric air entry, Heart exam - S1, S2 normal, no murmur, no gallop, rate regular Abdomen: soft, non-tender, without masses or  organomegaly. Wound appear have healed. EXT:no erythema, induration, or nodules. 2-3+ bilateral edema in the ankles/feet. Neurological: No deficits detected with limited exam.   Lab Results: Lab Results  Component Value Date   WBC 3.6* 07/22/2013   HGB 13.1 07/22/2013   HCT 40.9 07/22/2013   MCV 91.3 07/22/2013   PLT 185 07/22/2013      Chemistry      Component Value Date/Time   NA 147* 06/23/2013 1444   NA 142 01/16/2013 1720   K 3.5 06/23/2013 1444   K 3.6 01/16/2013 1720   CL 110 01/16/2013 1720   CL 110* 12/02/2012 1444   CO2 23 06/23/2013 1444   CO2 26 01/16/2013 1720   BUN 13.6 06/23/2013 1444   BUN 9 01/16/2013 1720   CREATININE 1.1 06/23/2013 1444   CREATININE 0.94 01/16/2013 1720      Component Value Date/Time   CALCIUM 8.8 06/23/2013 1444   CALCIUM 9.6 01/16/2013 1720   ALKPHOS 50 06/23/2013 1444   ALKPHOS 57 02/11/2012 1445   AST 19 06/23/2013 1444   AST 15 02/11/2012 1445   ALT 16 06/23/2013 1444   ALT 12 02/11/2012 1445   BILITOT 1.16 06/23/2013 1444   BILITOT 0.5 02/11/2012 1445       Results for TRISTA, CIOCCA (MRN [id number]) as of 07/22/2013 15:23  Ref. Range 04/14/2013 15:14 05/26/2013 15:26 06/09/2013 14:29  Kappa free light chain Latest Range: 0.33-1.94 mg/dL 370.00 (H) 193.00 (H) 372.00 (H)  Lambda Free Lght Chn Latest Range: 0.57-2.63 mg/dL 0.53 (L) 0.04 (L) 0.04 (L)      Impression and Plan:  This is a 63 year old female with the following issues:  1. Light chain multiple myeloma. She is status post 6 more months of Kyprolis that started 11/25/12 till January 2015. She has tolerated it well but suffered minor complications including excessive fatigue due to cumulative doses. I plan on to hold off on any chemotherapy for the time being I will watch her closely and reinstitute chemotherapy upon relapse. 2. Hypercalcemia. Calcium is normal today. She will receive Zometa every 8 weeks last treatment in 04/28/2014. This will be repeated in the future upon restarting chemotherapy 3. Pain in her hips/legs: Better with Fentanyl to 100 mcg/hr. 4. Anemia, multifactorial. Hemoglobin stable. 5. History of deep vein thrombosis. Previously anticoagulated with Coumadin. No new clots noted. No more anticoagulation at this time.  6. Hypertension seems to be under relatively good control.  7. LE edema.  Resolved now  8. IV access: PAC in place.  9. Follow up. In 5- 6 weeks.    RDEYCX,KGYJE 1/29/20153:19 PM

## 2013-07-22 NOTE — Telephone Encounter (Signed)
gv and printed appt sched anda vs for pt for March.... °

## 2013-07-23 LAB — KAPPA/LAMBDA LIGHT CHAINS: KAPPA FREE LGHT CHN: 363 mg/dL — AB (ref 0.33–1.94)

## 2013-08-06 ENCOUNTER — Other Ambulatory Visit: Payer: Self-pay | Admitting: *Deleted

## 2013-08-06 MED ORDER — MAGIC MOUTHWASH W/LIDOCAINE: 5.0000 mL | Freq: Four times a day (QID) | ORAL | Status: AC

## 2013-08-06 NOTE — Telephone Encounter (Signed)
Patient calling to c/o sore, red tongue. Per dr Alen Blew, okay to call in magic mouthwash. Done. Patient notified.

## 2013-08-13 ENCOUNTER — Telehealth: Payer: Self-pay | Admitting: Medical Oncology

## 2013-08-13 NOTE — Telephone Encounter (Signed)
Patient called c/o numbness in L hand going up to her arm for a week and a half. States she is having difficulty holding things without dropping them. Denies pain, swelling or tenderness. Will review with MD.  LOV 07/22/2013 sched for 08/27/13 Lab/NO

## 2013-08-13 NOTE — Telephone Encounter (Signed)
Informed patient, per MD, patient's symptoms r/t chemotherapy, MD advises pain medication as directed and rest. No new orders received. Patient gave verbal understanding. Confirmed upcoming appt 03/06. Patient knows to call office with any questions or concerns.

## 2013-08-27 ENCOUNTER — Ambulatory Visit (HOSPITAL_BASED_OUTPATIENT_CLINIC_OR_DEPARTMENT_OTHER): Payer: Medicaid Other | Admitting: Oncology

## 2013-08-27 ENCOUNTER — Ambulatory Visit (HOSPITAL_BASED_OUTPATIENT_CLINIC_OR_DEPARTMENT_OTHER): Payer: Medicaid Other

## 2013-08-27 ENCOUNTER — Other Ambulatory Visit (HOSPITAL_BASED_OUTPATIENT_CLINIC_OR_DEPARTMENT_OTHER): Payer: Medicaid Other

## 2013-08-27 ENCOUNTER — Encounter: Payer: Self-pay | Admitting: Oncology

## 2013-08-27 VITALS — BP 120/75 | HR 118 | Temp 98.3°F | Resp 19 | Ht 65.0 in

## 2013-08-27 DIAGNOSIS — K430 Incisional hernia with obstruction, without gangrene: Secondary | ICD-10-CM

## 2013-08-27 DIAGNOSIS — C9000 Multiple myeloma not having achieved remission: Secondary | ICD-10-CM

## 2013-08-27 DIAGNOSIS — K436 Other and unspecified ventral hernia with obstruction, without gangrene: Secondary | ICD-10-CM

## 2013-08-27 DIAGNOSIS — D649 Anemia, unspecified: Secondary | ICD-10-CM

## 2013-08-27 DIAGNOSIS — I10 Essential (primary) hypertension: Secondary | ICD-10-CM

## 2013-08-27 DIAGNOSIS — L02211 Cutaneous abscess of abdominal wall: Secondary | ICD-10-CM

## 2013-08-27 DIAGNOSIS — Z86718 Personal history of other venous thrombosis and embolism: Secondary | ICD-10-CM

## 2013-08-27 LAB — CBC WITH DIFFERENTIAL/PLATELET
BASO%: 0.6 % (ref 0.0–2.0)
BASOS ABS: 0 10*3/uL (ref 0.0–0.1)
EOS%: 0.3 % (ref 0.0–7.0)
Eosinophils Absolute: 0 10*3/uL (ref 0.0–0.5)
HCT: 43.8 % (ref 34.8–46.6)
HEMOGLOBIN: 13.9 g/dL (ref 11.6–15.9)
LYMPH#: 1.1 10*3/uL (ref 0.9–3.3)
LYMPH%: 26.4 % (ref 14.0–49.7)
MCH: 28.7 pg (ref 25.1–34.0)
MCHC: 31.8 g/dL (ref 31.5–36.0)
MCV: 90.2 fL (ref 79.5–101.0)
MONO#: 0.5 10*3/uL (ref 0.1–0.9)
MONO%: 11.2 % (ref 0.0–14.0)
NEUT#: 2.7 10*3/uL (ref 1.5–6.5)
NEUT%: 61.5 % (ref 38.4–76.8)
Platelets: 121 10*3/uL — ABNORMAL LOW (ref 145–400)
RBC: 4.85 10*6/uL (ref 3.70–5.45)
RDW: 16.2 % — AB (ref 11.2–14.5)
WBC: 4.3 10*3/uL (ref 3.9–10.3)

## 2013-08-27 LAB — COMPREHENSIVE METABOLIC PANEL (CC13)
ALBUMIN: 2.8 g/dL — AB (ref 3.5–5.0)
ALT: 30 U/L (ref 0–55)
AST: 61 U/L — AB (ref 5–34)
Alkaline Phosphatase: 67 U/L (ref 40–150)
Anion Gap: 8 mEq/L (ref 3–11)
BUN: 9.5 mg/dL (ref 7.0–26.0)
CALCIUM: 12.2 mg/dL — AB (ref 8.4–10.4)
CO2: 29 mEq/L (ref 22–29)
Chloride: 107 mEq/L (ref 98–109)
Creatinine: 1 mg/dL (ref 0.6–1.1)
Glucose: 90 mg/dl (ref 70–140)
POTASSIUM: 4.4 meq/L (ref 3.5–5.1)
SODIUM: 144 meq/L (ref 136–145)
TOTAL PROTEIN: 7.8 g/dL (ref 6.4–8.3)
Total Bilirubin: 0.66 mg/dL (ref 0.20–1.20)

## 2013-08-27 MED ORDER — OXYCODONE HCL 5 MG/5ML PO SOLN: 5.0000 mg | ORAL | Status: AC | PRN

## 2013-08-27 MED ORDER — LIDOCAINE-PRILOCAINE 2.5-2.5 % EX CREA
TOPICAL_CREAM | CUTANEOUS | Status: AC
  Filled 2013-08-27: qty 5

## 2013-08-27 MED ORDER — SODIUM CHLORIDE 0.9 % IJ SOLN
10.0000 mL | INTRAMUSCULAR | Status: DC | PRN
  Administered 2013-08-27: 10 mL via INTRAVENOUS
  Filled 2013-08-27: qty 10

## 2013-08-27 MED ORDER — SODIUM CHLORIDE 0.9 % IV SOLN
Freq: Once | INTRAVENOUS | Status: AC
  Administered 2013-08-27: 16:00:00 via INTRAVENOUS

## 2013-08-27 MED ORDER — ZOLEDRONIC ACID 4 MG/100ML IV SOLN
4.0000 mg | Freq: Once | INTRAVENOUS | Status: AC
  Administered 2013-08-27: 4 mg via INTRAVENOUS
  Filled 2013-08-27: qty 100

## 2013-08-27 MED ORDER — HEPARIN SOD (PORK) LOCK FLUSH 100 UNIT/ML IV SOLN
500.0000 [IU] | Freq: Once | INTRAVENOUS | Status: AC | PRN
  Administered 2013-08-27: 500 [IU] via INTRAVENOUS
  Filled 2013-08-27: qty 5

## 2013-08-27 NOTE — Patient Instructions (Signed)

## 2013-08-27 NOTE — Progress Notes (Signed)
Hematology and Oncology Follow Up Visit  [name] [id number] September 29, 1950 63 y.o. 08/27/2013 3:18 PM      Principle Diagnosis:  This is a 63 year old female with the following issues:   1. 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  She had a free kappa light chain around 1500. 2. Diagnosis of deep vein thrombosis in July 2011.   Prior Therapy:    1. Initially treated with melphalan and prednisone.  She had a partial response with free light chains down as low as 19, M spike down to 0.52 g/dL.  2. Patient treated with Revlimid at 25 mg 3 weeks on and 1 week off between November 2009 until November 2010.  She had a partial response, light chains down to 33 and M spike was not detected.   3. She was treated on maintenance Revlimid 15 mg to maintain her partial response. 4. She was on Zometa intermittently, on hold know. 5. Currently anticoagulated with Coumadin 7.5 mg since July 2011. 6. She received Velcade 1.3 mg/sq m for a total of 2.9 mg weekly, which started on 02/21/2011 with weekly dexamethasone .  Treatment has been on hold due to recurrent hospitalization for wound infection. Treatment restarted in in 06/2011 till 09/2011. 7. Kyprolis started on 02/04/12. She completed 6 months of therapy on 07/08/12  Current therapy: Resumed Kyprolis on 11/25/12. She is S/P 14 cycles completed in 06/2013. Now on a treatment break.  Receives Zometa 4 mg monthly.  Interim History:  [name] presents today for routine followup. She is lethargic, but is conversant. She is not able to give me a throrough history. Reports being weaker since last visit. Having difficulty swallowing pills and requests liquid medications when possible. Uses Oxycodone 2-4 tabs per day for pain. States she has been out of her Fentanyl patch for 1.5 weeks, but review of EMR indicates that this prescription has not been filled for her  since 04/14/13. No neurological symptoms. She is still ambulating without problems. She is reporting more and more fatigue as of late as and not feeling well enough to leave her house af frequently.   Medications: I have reviewed the patient's current medications.  Allergies:  Allergies  Allergen Reactions  . Ibuprofen Other (See Comments)    Patient does not remember.   . Morphine And Related Other (See Comments)    Patient does not remember reaction, believes it caused raised marks on skin.  . Other Rash    Tegaderm dressing    Past Medical History, Surgical history, Social history, and Family History were reviewed and updated.  Review of Systems:  Remaining ROS negative.  Physical Exam: Blood pressure 120/75, pulse 118, temperature 98.3 F (36.8 C), temperature source Oral, resp. rate 19, height 5' 5" (1.651 m), weight 0 lb (0 kg), SpO2 98.00%. ECOG: 1 General appearance: fatigued, no distress and slowed mentation Head: Normocephalic, without obvious abnormality, atraumatic Neck: no adenopathy, no carotid bruit, no JVD, supple, symmetrical, trachea midline and thyroid not enlarged, symmetric, no tenderness/mass/nodules Lymph nodes: Cervical, supraclavicular, and axillary nodes normal. Heart:regular rate and rhythm, S1, S2 normal, no murmur, click, rub or gallop Lung:chest clear, no wheezing, rales, normal symmetric air entry, Heart exam - S1, S2 normal, no murmur, no gallop, rate regular Abdomen: soft, non-tender, without masses or organomegaly. Wound appear have healed. EXT:no erythema, induration, or nodules. 2-3+ bilateral edema in the ankles/feet. Neurological: No deficits detected  with limited exam.   Lab Results: Lab Results  Component Value Date   WBC 4.3 08/27/2013   HGB 13.9 08/27/2013   HCT 43.8 08/27/2013   MCV 90.2 08/27/2013   PLT 121* 08/27/2013     Chemistry      Component Value Date/Time   NA 148* 07/22/2013 1454   NA 142 01/16/2013 1720   K 3.4* 07/22/2013 1454    K 3.6 01/16/2013 1720   CL 110 01/16/2013 1720   CL 110* 12/02/2012 1444   CO2 28 07/22/2013 1454   CO2 26 01/16/2013 1720   BUN 6.5* 07/22/2013 1454   BUN 9 01/16/2013 1720   CREATININE 0.8 07/22/2013 1454   CREATININE 0.94 01/16/2013 1720      Component Value Date/Time   CALCIUM 9.1 07/22/2013 1454   CALCIUM 9.6 01/16/2013 1720   ALKPHOS 54 07/22/2013 1454   ALKPHOS 57 02/11/2012 1445   AST 30 07/22/2013 1454   AST 15 02/11/2012 1445   ALT 17 07/22/2013 1454   ALT 12 02/11/2012 1445   BILITOT 0.52 07/22/2013 1454   BILITOT 0.5 02/11/2012 1445       Results for Sara, Howe (MRN [id number]) as of 07/22/2013 15:23  Ref. Range 04/14/2013 15:14 05/26/2013 15:26 06/09/2013 14:29  Kappa free light chain Latest Range: 0.33-1.94 mg/dL 370.00 (H) 193.00 (H) 372.00 (H)  Lambda Free Lght Chn Latest Range: 0.57-2.63 mg/dL 0.53 (L) 0.04 (L) 0.04 (L)      Impression and Plan:  This is a 63 year old female with the following issues:  1. Light chain multiple myeloma. She is status post 6 more months of Kyprolis that started 11/25/12 till January 2015. She has tolerated it well but suffered minor complications including excessive fatigue due to cumulative doses. I plan on to hold off on any chemotherapy for the time being I will watch her closely and reinstitute chemotherapy upon relapse. 2. Hypercalcemia. Calcium is elevated today and she is symptomatic. Will receive Zometa today. 3. Pain in her hips/legs: Stable on Oxycodone. I have refilled the Oxycodone in liquid form today due to difficulty swallowing pills. Will hold off on refilling her Fentanyl for now since I am unclear as to how long she has been off this and pain is overall controlled with short-acting pain medication. 4. Anemia, multifactorial. Hemoglobin stable. 5. History of deep vein thrombosis. Previously anticoagulated with Coumadin. No new clots noted. No more anticoagulation at this time.  6. Hypertension seems to be under relatively  good control.  7. LE edema. Resolved now  8. IV access: PAC in place.  9. Follow up. 09/08/13.    [name] 3/6/20153:18 PM  Patient seen and examined today. She appears relatively weaker today complaining of overall lethargy and occasional cough. She has not reported any fevers or chills or sweats. She has not reported any easy bruising or any falls. She is able to drink a plain fluid but not been eating very well.  On physical examination awake alert woman appeared slightly lethargic but not in any distress. Her heart was regular rate and rhythm lungs are clear auscultation she appeared mildly dehydrated. Abdominal soft extremities without any edema.  Laboratory data were personally reviewed today and showed a calcium of 12.2 normal creatinine at 1.0 and normal hemoglobin and hematocrits.  Impression and plan: This is a 63 year old woman with relapsed refractory multiple myeloma. She appears to be relapsing given her rise in calcium at this time which we will treat with Zometa. Once  this have stabilized we'll need to retreat her with salvage therapy Pomalyst  as this is the only agent she has not been on in the past. Other options would include retreatment with Velcade.   [facility] MD 08/27/2013

## 2013-08-30 ENCOUNTER — Telehealth: Payer: Self-pay | Admitting: *Deleted

## 2013-08-30 ENCOUNTER — Other Ambulatory Visit: Payer: Self-pay | Admitting: *Deleted

## 2013-08-30 ENCOUNTER — Telehealth: Payer: Self-pay | Admitting: Oncology

## 2013-08-30 DIAGNOSIS — C9000 Multiple myeloma not having achieved remission: Secondary | ICD-10-CM

## 2013-08-30 MED ORDER — ONDANSETRON HCL 8 MG PO TABS: 8.0000 mg | ORAL_TABLET | Freq: Three times a day (TID) | ORAL | Status: AC | PRN

## 2013-08-30 NOTE — Telephone Encounter (Addendum)
PT. SAW DR.SHADAD ON 08/27/13. SHE IS HAVING TROUBLE DRINKING DUE TO THE "MUCUS IN HER MOUTH". "IT IS CLEAR SOMETIMES THIN SOMETIMES THICK". PT. IS "FEELING WEAK". NO FEVER, CHILLS, OR SWEATS. PT. HAS AN OCCASIONAL COUGH. NO NAUSEA OR VOMITING. PT.'S RIGHT SIDE OF LOWER STOMACH IS SORE. "DIARRHEA X2 A FEW DAYS AGO BUT GOOD BOWEL MOVEMENT SINCE THAT TIME. PT. STATES "IT HURTS TO TALK". SHE WANTED TO HANG UP THE PHONE. THIS NOTE TO ADRENA JOHNSON,PA.

## 2013-08-30 NOTE — Telephone Encounter (Signed)
VERBAL ORDER AND READ ORDER TO ADRENA JOHNSON,PA- PT. TO [place]. SPOKE WITH PT. SHE HAS A LIQUID FORM OF MUCINEX. INSTRUCTED PT. AND HER DAYTIME CAREGIVER, COURTNEY, IN THE USE OF MUCINEX AND THE IMPORTANCE OF FORCING FLUIDS. BOTH VOICE UNDERSTANDING. PT. OR COURTNEY WILL CALL THIS OFFICE TOMORROW WITH AN UPDATE ON PT.'S CONDITION.

## 2013-08-30 NOTE — Telephone Encounter (Signed)
NO NOTE

## 2013-08-30 NOTE — Telephone Encounter (Signed)
, °

## 2013-08-31 ENCOUNTER — Other Ambulatory Visit: Payer: Self-pay | Admitting: Oncology

## 2013-09-01 ENCOUNTER — Telehealth: Payer: Self-pay | Admitting: *Deleted

## 2013-09-01 NOTE — Telephone Encounter (Signed)
sw pt gv appt for 09/10/13 w/ labs@ 2:45pm and ov@ 3:15pm. Pt is aware of her appts...td

## 2013-09-02 ENCOUNTER — Telehealth: Payer: Self-pay | Admitting: *Deleted

## 2013-09-02 NOTE — Telephone Encounter (Signed)
Patient calling to say she is unable to swallow good, needs decongestant, liquid mucinex won't stay down. Not eating and drinking very little.denies fever. Caregiver states she is much weaker and is having trouble with ADL. Per dr Alen Blew, is no improvement with in the next 1-2 days, she is to report to the E.R. Patient verbalizes understanding.

## 2013-09-06 ENCOUNTER — Emergency Department (HOSPITAL_COMMUNITY): Payer: Medicaid Other

## 2013-09-06 ENCOUNTER — Encounter (HOSPITAL_COMMUNITY): Payer: Self-pay | Admitting: Emergency Medicine

## 2013-09-06 ENCOUNTER — Inpatient Hospital Stay (HOSPITAL_COMMUNITY)
Admission: EM | Admit: 2013-09-06 | Discharge: 2013-10-22 | DRG: 870 | Disposition: E | Payer: Medicaid Other | Attending: Pulmonary Disease | Admitting: Pulmonary Disease

## 2013-09-06 ENCOUNTER — Inpatient Hospital Stay (HOSPITAL_COMMUNITY): Payer: Medicaid Other

## 2013-09-06 DIAGNOSIS — Z86718 Personal history of other venous thrombosis and embolism: Secondary | ICD-10-CM

## 2013-09-06 DIAGNOSIS — E872 Acidosis, unspecified: Secondary | ICD-10-CM | POA: Diagnosis not present

## 2013-09-06 DIAGNOSIS — I4729 Other ventricular tachycardia: Secondary | ICD-10-CM | POA: Diagnosis not present

## 2013-09-06 DIAGNOSIS — E785 Hyperlipidemia, unspecified: Secondary | ICD-10-CM | POA: Diagnosis present

## 2013-09-06 DIAGNOSIS — R5383 Other fatigue: Secondary | ICD-10-CM

## 2013-09-06 DIAGNOSIS — R5381 Other malaise: Secondary | ICD-10-CM | POA: Diagnosis present

## 2013-09-06 DIAGNOSIS — Z87891 Personal history of nicotine dependence: Secondary | ICD-10-CM

## 2013-09-06 DIAGNOSIS — R6521 Severe sepsis with septic shock: Secondary | ICD-10-CM

## 2013-09-06 DIAGNOSIS — R630 Anorexia: Secondary | ICD-10-CM | POA: Diagnosis not present

## 2013-09-06 DIAGNOSIS — D6959 Other secondary thrombocytopenia: Secondary | ICD-10-CM | POA: Diagnosis present

## 2013-09-06 DIAGNOSIS — Y95 Nosocomial condition: Secondary | ICD-10-CM

## 2013-09-06 DIAGNOSIS — F329 Major depressive disorder, single episode, unspecified: Secondary | ICD-10-CM | POA: Diagnosis present

## 2013-09-06 DIAGNOSIS — L259 Unspecified contact dermatitis, unspecified cause: Secondary | ICD-10-CM | POA: Diagnosis present

## 2013-09-06 DIAGNOSIS — I129 Hypertensive chronic kidney disease with stage 1 through stage 4 chronic kidney disease, or unspecified chronic kidney disease: Secondary | ICD-10-CM | POA: Diagnosis present

## 2013-09-06 DIAGNOSIS — J189 Pneumonia, unspecified organism: Secondary | ICD-10-CM | POA: Diagnosis present

## 2013-09-06 DIAGNOSIS — I959 Hypotension, unspecified: Secondary | ICD-10-CM

## 2013-09-06 DIAGNOSIS — E1169 Type 2 diabetes mellitus with other specified complication: Secondary | ICD-10-CM | POA: Diagnosis present

## 2013-09-06 DIAGNOSIS — N179 Acute kidney failure, unspecified: Secondary | ICD-10-CM

## 2013-09-06 DIAGNOSIS — I469 Cardiac arrest, cause unspecified: Secondary | ICD-10-CM | POA: Diagnosis not present

## 2013-09-06 DIAGNOSIS — C9000 Multiple myeloma not having achieved remission: Secondary | ICD-10-CM

## 2013-09-06 DIAGNOSIS — I4891 Unspecified atrial fibrillation: Secondary | ICD-10-CM | POA: Diagnosis not present

## 2013-09-06 DIAGNOSIS — K219 Gastro-esophageal reflux disease without esophagitis: Secondary | ICD-10-CM | POA: Diagnosis present

## 2013-09-06 DIAGNOSIS — E669 Obesity, unspecified: Secondary | ICD-10-CM | POA: Diagnosis present

## 2013-09-06 DIAGNOSIS — I4892 Unspecified atrial flutter: Secondary | ICD-10-CM | POA: Diagnosis not present

## 2013-09-06 DIAGNOSIS — R112 Nausea with vomiting, unspecified: Secondary | ICD-10-CM | POA: Diagnosis present

## 2013-09-06 DIAGNOSIS — K7200 Acute and subacute hepatic failure without coma: Secondary | ICD-10-CM | POA: Diagnosis not present

## 2013-09-06 DIAGNOSIS — N170 Acute kidney failure with tubular necrosis: Secondary | ICD-10-CM | POA: Diagnosis not present

## 2013-09-06 DIAGNOSIS — J9601 Acute respiratory failure with hypoxia: Secondary | ICD-10-CM

## 2013-09-06 DIAGNOSIS — I472 Ventricular tachycardia, unspecified: Secondary | ICD-10-CM | POA: Diagnosis not present

## 2013-09-06 DIAGNOSIS — R4020 Unspecified coma: Secondary | ICD-10-CM | POA: Diagnosis not present

## 2013-09-06 DIAGNOSIS — R6520 Severe sepsis without septic shock: Secondary | ICD-10-CM

## 2013-09-06 DIAGNOSIS — R531 Weakness: Secondary | ICD-10-CM

## 2013-09-06 DIAGNOSIS — Z8249 Family history of ischemic heart disease and other diseases of the circulatory system: Secondary | ICD-10-CM

## 2013-09-06 DIAGNOSIS — F411 Generalized anxiety disorder: Secondary | ICD-10-CM | POA: Diagnosis present

## 2013-09-06 DIAGNOSIS — A419 Sepsis, unspecified organism: Principal | ICD-10-CM | POA: Diagnosis present

## 2013-09-06 DIAGNOSIS — I509 Heart failure, unspecified: Secondary | ICD-10-CM | POA: Diagnosis present

## 2013-09-06 DIAGNOSIS — I5030 Unspecified diastolic (congestive) heart failure: Secondary | ICD-10-CM | POA: Diagnosis present

## 2013-09-06 DIAGNOSIS — G929 Unspecified toxic encephalopathy: Secondary | ICD-10-CM | POA: Diagnosis present

## 2013-09-06 DIAGNOSIS — R1310 Dysphagia, unspecified: Secondary | ICD-10-CM

## 2013-09-06 DIAGNOSIS — E2749 Other adrenocortical insufficiency: Secondary | ICD-10-CM | POA: Diagnosis present

## 2013-09-06 DIAGNOSIS — Z515 Encounter for palliative care: Secondary | ICD-10-CM

## 2013-09-06 DIAGNOSIS — G92 Toxic encephalopathy: Secondary | ICD-10-CM | POA: Diagnosis present

## 2013-09-06 DIAGNOSIS — G9340 Encephalopathy, unspecified: Secondary | ICD-10-CM

## 2013-09-06 DIAGNOSIS — F3289 Other specified depressive episodes: Secondary | ICD-10-CM | POA: Diagnosis present

## 2013-09-06 DIAGNOSIS — J9600 Acute respiratory failure, unspecified whether with hypoxia or hypercapnia: Secondary | ICD-10-CM | POA: Diagnosis present

## 2013-09-06 DIAGNOSIS — Z79899 Other long term (current) drug therapy: Secondary | ICD-10-CM

## 2013-09-06 DIAGNOSIS — Z8614 Personal history of Methicillin resistant Staphylococcus aureus infection: Secondary | ICD-10-CM

## 2013-09-06 DIAGNOSIS — E861 Hypovolemia: Secondary | ICD-10-CM | POA: Diagnosis present

## 2013-09-06 DIAGNOSIS — N189 Chronic kidney disease, unspecified: Secondary | ICD-10-CM | POA: Diagnosis present

## 2013-09-06 DIAGNOSIS — Z66 Do not resuscitate: Secondary | ICD-10-CM | POA: Diagnosis not present

## 2013-09-06 DIAGNOSIS — J13 Pneumonia due to Streptococcus pneumoniae: Secondary | ICD-10-CM | POA: Diagnosis present

## 2013-09-06 LAB — COMPREHENSIVE METABOLIC PANEL
ALT: 25 U/L (ref 0–35)
AST: 41 U/L — ABNORMAL HIGH (ref 0–37)
Albumin: 2.7 g/dL — ABNORMAL LOW (ref 3.5–5.2)
Alkaline Phosphatase: 63 U/L (ref 39–117)
BUN: 25 mg/dL — ABNORMAL HIGH (ref 6–23)
CO2: 24 mEq/L (ref 19–32)
Calcium: 10.9 mg/dL — ABNORMAL HIGH (ref 8.4–10.5)
Chloride: 107 mEq/L (ref 96–112)
Creatinine, Ser: 1.02 mg/dL (ref 0.50–1.10)
GFR calc Af Amer: 66 mL/min — ABNORMAL LOW (ref >90)
GFR calc non Af Amer: 57 mL/min — ABNORMAL LOW (ref >90)
Glucose, Bld: 91 mg/dL (ref 70–99)
Potassium: 4.4 mEq/L (ref 3.7–5.3)
Sodium: 147 mEq/L (ref 137–147)
Total Bilirubin: 0.8 mg/dL (ref 0.3–1.2)
Total Protein: 8.3 g/dL (ref 6.0–8.3)

## 2013-09-06 LAB — CBC WITH DIFFERENTIAL/PLATELET
Basophils Absolute: 0 10*3/uL (ref 0.0–0.1)
Basophils Relative: 0 % (ref 0–1)
Eosinophils Absolute: 0 10*3/uL (ref 0.0–0.7)
Eosinophils Relative: 0 % (ref 0–5)
HCT: 41.5 % (ref 36.0–46.0)
Hemoglobin: 13.9 g/dL (ref 12.0–15.0)
Lymphocytes Relative: 8 % — ABNORMAL LOW (ref 12–46)
Lymphs Abs: 0.5 10*3/uL — ABNORMAL LOW (ref 0.7–4.0)
MCH: 29.5 pg (ref 26.0–34.0)
MCHC: 33.5 g/dL (ref 30.0–36.0)
MCV: 88.1 fL (ref 78.0–100.0)
Monocytes Absolute: 0.3 10*3/uL (ref 0.1–1.0)
Monocytes Relative: 6 % (ref 3–12)
Neutro Abs: 5 10*3/uL (ref 1.7–7.7)
Neutrophils Relative %: 86 % — ABNORMAL HIGH (ref 43–77)
Platelets: 88 10*3/uL — ABNORMAL LOW (ref 150–400)
RBC: 4.71 MIL/uL (ref 3.87–5.11)
RDW: 16.1 % — ABNORMAL HIGH (ref 11.5–15.5)
WBC: 5.8 10*3/uL (ref 4.0–10.5)

## 2013-09-06 LAB — URINE MICROSCOPIC-ADD ON

## 2013-09-06 LAB — URINALYSIS, ROUTINE W REFLEX MICROSCOPIC
Glucose, UA: NEGATIVE mg/dL
Ketones, ur: NEGATIVE mg/dL
Leukocytes, UA: NEGATIVE
Nitrite: NEGATIVE
Protein, ur: 100 mg/dL — AB
Specific Gravity, Urine: 1.039 — ABNORMAL HIGH (ref 1.005–1.030)
Urobilinogen, UA: 1 mg/dL (ref 0.0–1.0)
pH: 6 (ref 5.0–8.0)

## 2013-09-06 LAB — CBG MONITORING, ED
Glucose-Capillary: 63 mg/dL — ABNORMAL LOW (ref 70–99)
Glucose-Capillary: 120 mg/dL — ABNORMAL HIGH (ref 70–99)

## 2013-09-06 LAB — MRSA PCR SCREENING: MRSA by PCR: NEGATIVE

## 2013-09-06 LAB — I-STAT CG4 LACTIC ACID, ED: Lactic Acid, Venous: 3.94 mmol/L — ABNORMAL HIGH (ref 0.5–2.2)

## 2013-09-06 LAB — PRO B NATRIURETIC PEPTIDE: PRO B NATRI PEPTIDE: 8205 pg/mL — AB (ref 0–125)

## 2013-09-06 MED ORDER — MAGIC MOUTHWASH W/LIDOCAINE
5.0000 mL | Freq: Four times a day (QID) | ORAL | Status: DC
  Administered 2013-09-08 – 2013-09-21 (×38): 5 mL via ORAL
  Filled 2013-09-06 (×62): qty 5

## 2013-09-06 MED ORDER — METOPROLOL TARTRATE 1 MG/ML IV SOLN
5.0000 mg | Freq: Once | INTRAVENOUS | Status: AC
  Administered 2013-09-06: 5 mg via INTRAVENOUS

## 2013-09-06 MED ORDER — DEXTROSE 5 % IV SOLN
1.0000 g | Freq: Three times a day (TID) | INTRAVENOUS | Status: DC
  Administered 2013-09-06 – 2013-09-07 (×2): 1 g via INTRAVENOUS
  Filled 2013-09-06 (×3): qty 1

## 2013-09-06 MED ORDER — DEXTROSE 5 % IV SOLN
1.0000 g | Freq: Once | INTRAVENOUS | Status: AC
  Administered 2013-09-06: 1 g via INTRAVENOUS
  Filled 2013-09-06: qty 10

## 2013-09-06 MED ORDER — IOHEXOL 350 MG/ML SOLN: 100.0000 mL | Freq: Once | INTRAVENOUS | Status: AC | PRN

## 2013-09-06 MED ORDER — DEXTROSE 50 % IV SOLN
1.0000 | Freq: Once | INTRAVENOUS | Status: AC
  Administered 2013-09-06: 50 mL via INTRAVENOUS
  Filled 2013-09-06: qty 50

## 2013-09-06 MED ORDER — METOPROLOL TARTRATE 1 MG/ML IV SOLN
INTRAVENOUS | Status: AC
Start: 2013-09-06 — End: 2013-09-06
  Administered 2013-09-06: 5 mg via INTRAVENOUS
  Filled 2013-09-06: qty 5

## 2013-09-06 MED ORDER — LIDOCAINE-PRILOCAINE 2.5-2.5 % EX CREA
TOPICAL_CREAM | CUTANEOUS | Status: DC | PRN
  Filled 2013-09-06: qty 5

## 2013-09-06 MED ORDER — SODIUM CHLORIDE 0.9 % IV SOLN
INTRAVENOUS | Status: AC
  Administered 2013-09-06 – 2013-09-07 (×2): via INTRAVENOUS

## 2013-09-06 MED ORDER — VANCOMYCIN HCL 10 G IV SOLR
1250.0000 mg | Freq: Two times a day (BID) | INTRAVENOUS | Status: DC
  Filled 2013-09-06: qty 1250

## 2013-09-06 MED ORDER — DEXTROSE 5 % IV SOLN
500.0000 mg | Freq: Once | INTRAVENOUS | Status: DC
  Administered 2013-09-06: 500 mg via INTRAVENOUS

## 2013-09-06 MED ORDER — LORAZEPAM 2 MG/ML IJ SOLN
0.5000 mg | Freq: Once | INTRAMUSCULAR | Status: AC
  Administered 2013-09-06: 0.5 mg via INTRAVENOUS
  Filled 2013-09-06: qty 1

## 2013-09-06 MED ORDER — SODIUM CHLORIDE 0.9 % IV BOLUS (SEPSIS)
500.0000 mL | INTRAVENOUS | Status: AC
  Administered 2013-09-06: 500 mL via INTRAVENOUS

## 2013-09-06 MED ORDER — PIPERACILLIN-TAZOBACTAM 3.375 G IVPB 30 MIN
3.3750 g | Freq: Once | INTRAVENOUS | Status: DC
  Filled 2013-09-06: qty 50

## 2013-09-06 MED ORDER — SODIUM CHLORIDE 0.9 % IV BOLUS (SEPSIS)
500.0000 mL | Freq: Once | INTRAVENOUS | Status: AC
  Administered 2013-09-06: 500 mL via INTRAVENOUS

## 2013-09-06 MED ORDER — PIPERACILLIN-TAZOBACTAM 3.375 G IVPB: 3.3750 g | Freq: Once | INTRAVENOUS | Status: DC

## 2013-09-06 MED ORDER — VANCOMYCIN HCL IN DEXTROSE 1-5 GM/200ML-% IV SOLN
1000.0000 mg | Freq: Once | INTRAVENOUS | Status: AC
  Administered 2013-09-06: 1000 mg via INTRAVENOUS
  Filled 2013-09-06: qty 200

## 2013-09-06 NOTE — ED Notes (Signed)
Patient refused CT angio. PA and admitting made aware.

## 2013-09-06 NOTE — ED Provider Notes (Signed)
CSN: [id number]     Arrival date & time 08/25/2013  1507 History   First MD Initiated Contact with Patient 09/07/2013 1521     Chief Complaint  Patient presents with  . Weakness  . Fatigue     (Consider location/radiation/quality/duration/timing/severity/associated sxs/prior Treatment) HPI Presents emergency department with increasing weakness, and lethargy over the last couple of weeks, but worse over the last 3 days.  Patient denies chest pain, shortness of breath, nausea, vomiting, diarrhea, headache, blurred vision, dizziness, numbness dysuria, fever, or syncope.  The patient states nothing seems to make her condition, better or worse. Past Medical History  Diagnosis Date  . Small bowel obstruction   . Diabetes mellitus   . Hypertension   . Hyperlipidemia   . Chronic renal insufficiency   . Multiple myeloma   . Abdominal pain   . Ventral hernia   . Congestive heart failure   . GERD (gastroesophageal reflux disease)   . Renal insufficiency   . Obesity   . Depression   . Anxiety   . Abscess     groin   Past Surgical History  Procedure Laterality Date  . Hernia repair      ventral hernia  . Exploratory laparotomy    . Oophorectomy    . Breast surgery      reduction  . Panniculectomy    . Incise and drain abcess      groin   Family History  Problem Relation Age of Onset  . Heart disease Mother    History  Substance Use Topics  . Smoking status: Former Research scientist (life sciences)  . Smokeless tobacco: Never Used  . Alcohol Use: No   OB History   Grav Para Term Preterm Abortions TAB SAB Ect Mult Living                 Review of Systems  All other systems negative except as documented in the HPI. All pertinent positives and negatives as reviewed in the HPI.   Allergies  Ibuprofen; Morphine and related; and Other  Home Medications   Current Outpatient Rx  Name  Route  Sig  Dispense  Refill  . Alum & Mag Hydroxide-Simeth (MAGIC MOUTHWASH W/LIDOCAINE) SOLN   Oral   Take 5  mLs by mouth 4 (four) times daily.   120 mL   1   . amLODipine (NORVASC) 5 MG tablet   Oral   Take 1 tablet (5 mg total) by mouth daily.   30 tablet   11     Refill request was faxed.   Marland Kitchen atenolol (TENORMIN) 25 MG tablet   Oral   Take 12.5 mg by mouth daily.         Marland Kitchen buPROPion (WELLBUTRIN XL) 150 MG 24 hr tablet   Oral   Take 1 tablet (150 mg total) by mouth daily.   30 tablet   11   . diazepam (VALIUM) 5 MG tablet   Oral   Take 1 tablet (5 mg total) by mouth every 8 (eight) hours as needed for anxiety.   65 tablet   0   . furosemide (LASIX) 40 MG tablet   Oral   Take 1 tablet (40 mg total) by mouth daily.   30 tablet   11   . lidocaine-prilocaine (EMLA) cream   Topical   Apply topically as needed.   30 g   1   . loratadine (CLARITIN) 10 MG tablet   Oral   Take 10 mg by mouth  daily.           . Multiple Vitamin (MULTIVITAMIN) tablet   Oral   Take 1 tablet by mouth daily.           . ondansetron (ZOFRAN) 8 MG tablet   Oral   Take 1 tablet (8 mg total) by mouth every 8 (eight) hours as needed.   30 tablet   0   . oxyCODONE (ROXICODONE) 5 MG/5ML solution   Oral   Take 5-10 mLs (5-10 mg total) by mouth every 4 (four) hours as needed for severe pain (Take 5-10 mls every 4 hours as needed for pain.).   500 mL   0   . temazepam (RESTORIL) 15 MG capsule   Oral   Take 1 capsule (15 mg total) by mouth at bedtime as needed.   20 capsule   1    BP 116/51  Pulse 130  Temp(Src) 100.6 F (38.1 C) (Rectal)  Resp 28  SpO2 90% Physical Exam  Nursing note and vitals reviewed. Constitutional: She is oriented to person, place, and time. She appears well-developed and well-nourished. No distress.  HENT:  Head: Normocephalic and atraumatic.  Mouth/Throat: Oropharynx is clear and moist.  Eyes: Pupils are equal, round, and reactive to light.  Neck: Normal range of motion. Neck supple.  Cardiovascular: Regular rhythm, normal heart sounds and intact distal  pulses.  Tachycardia present.  Exam reveals no gallop and no friction rub.   No murmur heard. Pulmonary/Chest: Effort normal and breath sounds normal. No respiratory distress. She has no wheezes.  Neurological: She is alert and oriented to person, place, and time. She exhibits normal muscle tone. Coordination normal.  Skin: Skin is warm and dry. No rash noted. No erythema.    ED Course  Procedures (including critical care time) Labs Review Labs Reviewed  CBC WITH DIFFERENTIAL - Abnormal; Notable for the following:    RDW 16.1 (*)    Platelets 88 (*)    Neutrophils Relative % 86 (*)    Lymphocytes Relative 8 (*)    Lymphs Abs 0.5 (*)    All other components within normal limits  COMPREHENSIVE METABOLIC PANEL - Abnormal; Notable for the following:    BUN 25 (*)    Calcium 10.9 (*)    Albumin 2.7 (*)    AST 41 (*)    GFR calc non Af Amer 57 (*)    GFR calc Af Amer 66 (*)    All other components within normal limits  URINALYSIS, ROUTINE W REFLEX MICROSCOPIC - Abnormal; Notable for the following:    Color, Urine AMBER (*)    Specific Gravity, Urine 1.039 (*)    Hgb urine dipstick MODERATE (*)    Bilirubin Urine MODERATE (*)    Protein, ur 100 (*)    All other components within normal limits  URINE MICROSCOPIC-ADD ON - Abnormal; Notable for the following:    Bacteria, UA FEW (*)    Casts HYALINE CASTS (*)    All other components within normal limits  I-STAT CG4 LACTIC ACID, ED - Abnormal; Notable for the following:    Lactic Acid, Venous 3.94 (*)    All other components within normal limits  CBG MONITORING, ED - Abnormal; Notable for the following:    Glucose-Capillary 120 (*)    All other components within normal limits  URINE CULTURE   Imaging Review Dg Chest 2 View  08/28/2013   CLINICAL DATA:  Shortness of breath, history multiple myeloma, hypertension, diabetes,  CHF, smoking  EXAM: CHEST  2 VIEW  COMPARISON:  09/05/2013 at 1555 hr  FINDINGS: Right jugular Port-A-Cath  stable with tip projecting over SVC.  Borderline cardiac enlargement.  Mediastinal contours and pulmonary vascularity normal.  Lungs clear.  Atherosclerotic calcification aorta.  Retrocardiac left lower lobe atelectasis versus consolidation.  Lungs otherwise clear.  No pleural effusion or pneumothorax.  Osseous demineralization with multilevel endplate spur formation thoracic spine and dextro convex scoliosis.  IMPRESSION: Atelectasis versus consolidation in retrocardiac left lower lobe.   Electronically Signed   By: Lavonia Dana M.D.   On: 09/19/2013 17:12   Ct Head Wo Contrast  09/08/2013   CLINICAL DATA:  Weakness and fatigue.  Multiple myeloma.  EXAM: CT HEAD WITHOUT CONTRAST  TECHNIQUE: Contiguous axial images were obtained from the base of the skull through the vertex without intravenous contrast.  COMPARISON:  CT HEAD W/O CM dated 01/31/2011; CT ABD/PELV WO CM dated 02/02/2011; DG HIP COMPLETE*L* dated 01/30/2012; MR EXTREM LOW*R* W/O CM dated 01/24/2007  FINDINGS: Empty or partially empty sella. The brainstem, cerebellum, cerebral peduncles, thalamus, basal ganglia, basilar cisterns, and ventricular system appear within normal limits. No intracranial hemorrhage, mass lesion, or acute CVA. Continued abnormal sclerosis along the left middle cranial fossa and temporal bone, with thickening and sclerosis as before. Generalized calvarial thickening with extensive heterogeneity and punctate lucencies similar to prior exam.  IMPRESSION: 1. No acute intracranial findings. 2. Similar appearance of empty or partially empty sella compared to prior exam from 01/31/2011. 3. Stable calvarial findings indicative of myelomatous involvement. Stable cortical thickening, sclerosis, and spiculated cortical margins in the squamosal portion of the left temporal bone, unchanged and possibly from Paget's disease or an unusual manifestation of myeloma.   Electronically Signed   By: Sherryl Barters M.D.   On: 09/02/2013 16:48   Dg  Chest Port 1 View  08/26/2013   CLINICAL DATA:  Weakness, shortness of breath  EXAM: PORTABLE CHEST - 1 VIEW  COMPARISON:  DG CHEST 2 VIEW dated 01/31/2011; CT ABSCESS DRAINAGE dated 12/12/2010; CT ABD/PELV WO CM dated 12/21/2010; CT ABD/PELVIS W CM dated 03/27/2011  FINDINGS: There is mild bilateral interstitial prominence, likely chronic. There is left retrocardiac hazy airspace disease which may reflect atelectasis versus developing pneumonia. There is no other focal parenchymal opacity. There is a trace left pleural effusion. The heart and mediastinal contours are unremarkable. There is a right-sided Port-A-Cath in satisfactory position.  The osseous structures are unremarkable.  IMPRESSION: 1. There is hazy left lower lobe retrocardiac airspace opacity which may reflect atelectasis versus developing pneumonia. Further evaluation with dedicated PA and lateral chest radiographs recommended.   Electronically Signed   By: Kathreen Devoid   On: 09/02/2013 16:14   Patient be admitted to the hospital for further evaluation and possible pneumonia.  Patient is advised of the plan.  IV antibiotics were started. spoke with the [facility], who will admit the patient      Brent General, PA-C 09/08/13 0127

## 2013-09-06 NOTE — ED Notes (Signed)
[place]hattan Mccuen (sister) [phone] or [phone]

## 2013-09-06 NOTE — Progress Notes (Signed)
ANTIBIOTIC CONSULT NOTE - INITIAL  Pharmacy Consult for Vancomycin, Antibiotic renal dose adjustment Indication: pneumonia  Allergies  Allergen Reactions  . Ibuprofen Other (See Comments)    Patient does not remember.   . Morphine And Related Other (See Comments)    Patient does not remember reaction, believes it caused raised marks on skin.  . Other Rash    Tegaderm dressing    Patient Measurements:    Vital Signs: Temp: 98.3 F (36.8 C) (03/16 2106) Temp src: Oral (03/16 2106) BP: 123/51 mmHg (03/16 2155) Pulse Rate: 115 (03/16 2106) Intake/Output from previous day:   Intake/Output from this shift:    Labs:  Recent Labs  08/26/2013 1650  WBC 5.8  HGB 13.9  PLT 88*  CREATININE 1.02   The CrCl is unknown because both a height and weight (above a minimum accepted value) are required for this calculation. No results found for this basename: VANCOTROUGH, VANCOPEAK, VANCORANDOM, GENTTROUGH, GENTPEAK, GENTRANDOM, TOBRATROUGH, TOBRAPEAK, TOBRARND, AMIKACINPEAK, AMIKACINTROU, AMIKACIN,  in the last 72 hours   Microbiology: No results found for this or any previous visit (from the past 720 hour(s)).  Medical History: Past Medical History  Diagnosis Date  . Small bowel obstruction   . Diabetes mellitus   . Hypertension   . Hyperlipidemia   . Chronic renal insufficiency   . Multiple myeloma   . Abdominal pain   . Ventral hernia   . Congestive heart failure   . GERD (gastroesophageal reflux disease)   . Renal insufficiency   . Obesity   . Depression   . Anxiety   . Abscess     groin     Assessment: 62 yoF with multiple myeloma admitted to ICU with sepsis secondary to HCAP.  Starting Vancomycin and Cefepime.  Azithromycin and Ceftriaxone were given in ED.  Currently afebrile  WBC 5.8  SCr 1.02, CrCl~63 ml/min (normalized), ~86 ml/min (CG)  Weight 99.2 kg documented 07/22/13  Blood, sputum, urine cultures and respiratory virus panel ordered.  Goal of  Therapy:  Vancomycin trough level 15-20 mcg/ml Eradication of infection  Plan:  1.  Vancomcyin 1250 mg IV q12h. 2.  Continue Cefepime 1g IV q8h. 3.  F/u SCr, trough levels, culture results, and clinical course.  Hershal Coria 08/28/2013,9:58 PM

## 2013-09-06 NOTE — ED Notes (Signed)
Patient's caregiver told EMS that the patient has become more weak and lethargic over the past couple of days.

## 2013-09-06 NOTE — Progress Notes (Signed)
Utilization Review completed.  Dagan Heinz RN CM  

## 2013-09-06 NOTE — ED Notes (Signed)
Bed: EM75 Expected date:  Expected time:  Means of arrival:  Comments: EMS - weakness, poss uti

## 2013-09-06 NOTE — H&P (Addendum)
[facility] History and Physical  Dorathy A Donn MRN:[id number] DOB: 03/20/1951 DOA: 08/26/2013  Referring physician:  PCP: No PCP Per Patient  Specialists:   Chief Complaint: Weakness and fatigue  HPI: [name] is a 63 y.o. female  With a history of multiple myeloma, CHF, depression and anxiety presents to the emergency department for weakness and fatigue. Patient states that she has been feeling increasingly tired over the past 3 weeks. She also states that she's been unable to eat or drink anything without feeling nauseous or vomit. Patient states she's been having trouble swallowing. Over the last month, patient has been somewhat confused and less responsive according to report given to the ER physician.  At this time she is able to answer all questions appropriately and appears to be at her baseline. Patient denies any shortness of breath, chest pain, cough at this time. She only complains of the inability to speak. Patient denies any fever, chills, sick or ill contacts, recent travel.  Review of Systems:  Constitutional: Complains of generalized fatigue as well as weakness. Denies any fever or chills.  HEENT: Complains of trouble swallowing. Respiratory: Denies SOB, DOE, cough, chest tightness,  and wheezing.   Cardiovascular: Denies chest pain, palpitations and leg swelling.  Gastrointestinal: Complains of nausea and vomiting with eating and drinking. Genitourinary: Denies dysuria, urgency, frequency, hematuria, flank pain and difficulty urinating.  Musculoskeletal: Denies myalgias, back pain, joint swelling, arthralgias and gait problem.  Admits to immobility. Skin: Denies pallor, rash and wound.  Neurological: Complains of generalized weakness and fatigue. Hematological: Denies adenopathy. Easy bruising, personal or family bleeding history  Psychiatric/Behavioral: Denies suicidal ideation, mood changes, confusion, nervousness, sleep disturbance and  agitation  Past Medical History  Diagnosis Date  . Small bowel obstruction   . Diabetes mellitus   . Hypertension   . Hyperlipidemia   . Chronic renal insufficiency   . Multiple myeloma   . Abdominal pain   . Ventral hernia   . Congestive heart failure   . GERD (gastroesophageal reflux disease)   . Renal insufficiency   . Obesity   . Depression   . Anxiety   . Abscess     groin   Past Surgical History  Procedure Laterality Date  . Hernia repair      ventral hernia  . Exploratory laparotomy    . Oophorectomy    . Breast surgery      reduction  . Panniculectomy    . Incise and drain abcess      groin   Social History:  reports that she has quit smoking. She has never used smokeless tobacco. She reports that she does not drink alcohol or use illicit drugs. Lives at home. Does not use a cane or walker for ambulation. Admits to recently sitting for long periods of time.  Allergies  Allergen Reactions  . Ibuprofen Other (See Comments)    Patient does not remember.   . Morphine And Related Other (See Comments)    Patient does not remember reaction, believes it caused raised marks on skin.  . Other Rash    Tegaderm dressing    Family History  Problem Relation Age of Onset  . Heart disease Mother     Prior to Admission medications   Medication Sig Start Date End Date Taking? Authorizing Provider  Alum & Mag Hydroxide-Simeth (MAGIC MOUTHWASH W/LIDOCAINE) SOLN Take 5 mLs by mouth 4 (four) times daily. 08/06/13  Yes Firas N Shadad, MD  amLODipine (NORVASC) 5 MG   tablet Take 1 tablet (5 mg total) by mouth daily. 09/22/12  Yes Theodis Blaze, MD  atenolol (TENORMIN) 25 MG tablet Take 12.5 mg by mouth daily. 09/22/12  Yes Theodis Blaze, MD  buPROPion (WELLBUTRIN XL) 150 MG 24 hr tablet Take 1 tablet (150 mg total) by mouth daily. 09/22/12  Yes Theodis Blaze, MD  diazepam (VALIUM) 5 MG tablet Take 1 tablet (5 mg total) by mouth every 8 (eight) hours as needed for anxiety. 01/05/13  Yes  Wyatt Portela, MD  furosemide (LASIX) 40 MG tablet Take 1 tablet (40 mg total) by mouth daily. 09/22/12  Yes Theodis Blaze, MD  lidocaine-prilocaine (EMLA) cream Apply topically as needed. 04/22/13 04/22/14 Yes Wyatt Portela, MD  loratadine (CLARITIN) 10 MG tablet Take 10 mg by mouth daily.     Yes Historical Provider, MD  Multiple Vitamin (MULTIVITAMIN) tablet Take 1 tablet by mouth daily.     Yes Historical Provider, MD  ondansetron (ZOFRAN) 8 MG tablet Take 1 tablet (8 mg total) by mouth every 8 (eight) hours as needed. 08/30/13  Yes Wyatt Portela, MD  oxyCODONE (ROXICODONE) 5 MG/5ML solution Take 5-10 mLs (5-10 mg total) by mouth every 4 (four) hours as needed for severe pain (Take 5-10 mls every 4 hours as needed for pain.). 08/27/13  Yes Maryanna Shape, NP  temazepam (RESTORIL) 15 MG capsule Take 1 capsule (15 mg total) by mouth at bedtime as needed. 07/07/12  Yes Wyatt Portela, MD   Physical Exam: Filed Vitals:   09/12/2013 2030  BP: 116/51  Pulse:   Temp:   Resp: 28     General: Well developed, well nourished, NAD, appears stated age  HEENT: NCAT, PERRLA, EOMI, Anicteic Sclera, mucous membranes dry  Neck: Supple, no JVD, no masses  Cardiovascular: S1 S2 auscultated, tachycardic, 3/6 SEM  Respiratory: Diminished breath sounds, tachypneic.  Abdomen: Soft, nontender, nondistended, + bowel sounds  Extremities: warm dry without cyanosis clubbing. +1 edema LE B/L  Neuro: AAOx3, cranial nerves grossly intact. Strength 4/5 in patient's upper and lower extremities bilaterally  Skin: Without rashes exudates or nodules  Psych: Normal affect and demeanor with intact judgement and insight  Labs on Admission:  Basic Metabolic Panel:  Recent Labs Lab 09/17/2013 1650  NA 147  K 4.4  CL 107  CO2 24  GLUCOSE 91  BUN 25*  CREATININE 1.02  CALCIUM 10.9*   Liver Function Tests:  Recent Labs Lab 09/12/2013 1650  AST 41*  ALT 25  ALKPHOS 63  BILITOT 0.8  PROT 8.3  ALBUMIN  2.7*   No results found for this basename: LIPASE, AMYLASE,  in the last 168 hours No results found for this basename: AMMONIA,  in the last 168 hours CBC:  Recent Labs Lab 08/29/2013 1650  WBC 5.8  NEUTROABS 5.0  HGB 13.9  HCT 41.5  MCV 88.1  PLT 88*   Cardiac Enzymes: No results found for this basename: CKTOTAL, CKMB, CKMBINDEX, TROPONINI,  in the last 168 hours  BNP (last 3 results) No results found for this basename: PROBNP,  in the last 8760 hours CBG:  Recent Labs Lab 09/09/2013 1811  GLUCAP 120*    Radiological Exams on Admission: Dg Chest 2 View  09/02/2013   CLINICAL DATA:  Shortness of breath, history multiple myeloma, hypertension, diabetes, CHF, smoking  EXAM: CHEST  2 VIEW  COMPARISON:  09/18/2013 at 1555 hr  FINDINGS: Right jugular Port-A-Cath stable with tip projecting over SVC.  Borderline cardiac enlargement.  Mediastinal contours and pulmonary vascularity normal.  Lungs clear.  Atherosclerotic calcification aorta.  Retrocardiac left lower lobe atelectasis versus consolidation.  Lungs otherwise clear.  No pleural effusion or pneumothorax.  Osseous demineralization with multilevel endplate spur formation thoracic spine and dextro convex scoliosis.  IMPRESSION: Atelectasis versus consolidation in retrocardiac left lower lobe.   Electronically Signed   By: Mark  Boles M.D.   On: 09/19/2013 17:12   Ct Head Wo Contrast  09/09/2013   CLINICAL DATA:  Weakness and fatigue.  Multiple myeloma.  EXAM: CT HEAD WITHOUT CONTRAST  TECHNIQUE: Contiguous axial images were obtained from the base of the skull through the vertex without intravenous contrast.  COMPARISON:  CT HEAD W/O CM dated 01/31/2011; CT ABD/PELV WO CM dated 02/02/2011; DG HIP COMPLETE*L* dated 01/30/2012; MR EXTREM LOW*R* W/O CM dated 01/24/2007  FINDINGS: Empty or partially empty sella. The brainstem, cerebellum, cerebral peduncles, thalamus, basal ganglia, basilar cisterns, and ventricular system appear within normal  limits. No intracranial hemorrhage, mass lesion, or acute CVA. Continued abnormal sclerosis along the left middle cranial fossa and temporal bone, with thickening and sclerosis as before. Generalized calvarial thickening with extensive heterogeneity and punctate lucencies similar to prior exam.  IMPRESSION: 1. No acute intracranial findings. 2. Similar appearance of empty or partially empty sella compared to prior exam from 01/31/2011. 3. Stable calvarial findings indicative of myelomatous involvement. Stable cortical thickening, sclerosis, and spiculated cortical margins in the squamosal portion of the left temporal bone, unchanged and possibly from Paget's disease or an unusual manifestation of myeloma.   Electronically Signed   By: Walt  Liebkemann M.D.   On: 08/25/2013 16:48   Dg Chest Port 1 View  09/15/2013   CLINICAL DATA:  Weakness, shortness of breath  EXAM: PORTABLE CHEST - 1 VIEW  COMPARISON:  DG CHEST 2 VIEW dated 01/31/2011; CT ABSCESS DRAINAGE dated 12/12/2010; CT ABD/PELV WO CM dated 12/21/2010; CT ABD/PELVIS W CM dated 03/27/2011  FINDINGS: There is mild bilateral interstitial prominence, likely chronic. There is left retrocardiac hazy airspace disease which may reflect atelectasis versus developing pneumonia. There is no other focal parenchymal opacity. There is a trace left pleural effusion. The heart and mediastinal contours are unremarkable. There is a right-sided Port-A-Cath in satisfactory position.  The osseous structures are unremarkable.  IMPRESSION: 1. There is hazy left lower lobe retrocardiac airspace opacity which may reflect atelectasis versus developing pneumonia. Further evaluation with dedicated PA and lateral chest radiographs recommended.   Electronically Signed   By: Hetal  Patel   On: 09/05/2013 16:14    EKG: Independently reviewed. Sinus tachycardia, rate 125  Assessment/Plan  Sepsis secondary to healthcare associated pneumonia Patient be admitted to the step down unit.  She is currently hypotensive, tachycardic as well as tachypneic and febrile.  Patient has been receiving fluid in the emergency department. Her latest blood pressure was noted to be in the 80s systolically. Chest x-ray shows atelectasis versus consolidation in the retrocardiac left lower lobe.  CT chest to rule out PE currently pending. Patient was placed on broad-spectrum Rx of vancomycin and cefepime. Patient will also be placed on gentle rehydration with IV fluids. She is immunocompromised due to her history of multiple myeloma with frequent doctors visits. Pending blood cultures, sputum culture and Gram stain if possible, urine Legionella and strep pneumonia antigens as well as a viral panel. Will place her on oxygen to maintain her saturations above 92%.  Acute encephalopathy Likely secondary to the above. Currently improving. CT   of the head did not show any acute intracranial findings. Patient does appear to be at her baseline, and is able to answer all questions appropriately.  Will obtain an ammonia, TSH, B12, folate levels.  Bacteriuria Patient's UA did show 3-6 white blood cells with few bacteria, negative for leukocytes and nitrates. Patient does not complain of any dysuria at this time. Patient will be placed on vancomycin as well as cefepime.  Dysphagia Admits to problems with drinking and eating.  Feels nauseated and vomits from time to time with intake. Will obtain a swallow study, make patient n.p.o.  Generalized weakness Will consult physical therapy and occupational therapy for evaluation and treatment. Will also obtain labs as indicated above.  Multiple myeloma with thrombocytopenia and hypercalcemia On no medications.  Will notify Dr. Alen Blew of patient's admission.  Congestive heart failure No documented EF. Per patient she has CHF. Will continue to monitor her daily weights, intake as well as output. Will obtain BNP level.  Anxiety/depression Will hold her medications,  Wellbutrin, temazepam, and Valium, for now due to her dysphagia.  Diabetes mellitus Patient appears to be on no home medications. Will obtain a hemoglobin A1c.  Hypertension Will hold her medications, amlodipine, atenolol, and Lasix due to her hypotension as well as sepsis.  DVT prophylaxis: SCDs  Code Status: Full  Condition: Guarded  Family Communication: None at bedside.  Attempted to contact family, unable to leave a message.  Admission, patients condition and plan of care including tests being ordered have been discussed with the patient, who indicates understanding and agrees with the plan and Code Status.  Disposition Plan: Admitted  Time spent: 60 minutes  Milyn Stapleton D.O. [facility] Pager [phone]  If 7PM-7AM, please contact night-coverage [email or web address] Password [facility] 08/26/2013, 8:42 PM

## 2013-09-07 ENCOUNTER — Inpatient Hospital Stay (HOSPITAL_COMMUNITY): Payer: Medicaid Other

## 2013-09-07 DIAGNOSIS — J9601 Acute respiratory failure with hypoxia: Secondary | ICD-10-CM

## 2013-09-07 DIAGNOSIS — I517 Cardiomegaly: Secondary | ICD-10-CM

## 2013-09-07 DIAGNOSIS — J9600 Acute respiratory failure, unspecified whether with hypoxia or hypercapnia: Secondary | ICD-10-CM

## 2013-09-07 DIAGNOSIS — A419 Sepsis, unspecified organism: Principal | ICD-10-CM

## 2013-09-07 DIAGNOSIS — C9002 Multiple myeloma in relapse: Secondary | ICD-10-CM

## 2013-09-07 DIAGNOSIS — J189 Pneumonia, unspecified organism: Secondary | ICD-10-CM

## 2013-09-07 DIAGNOSIS — R6521 Severe sepsis with septic shock: Secondary | ICD-10-CM

## 2013-09-07 DIAGNOSIS — D696 Thrombocytopenia, unspecified: Secondary | ICD-10-CM

## 2013-09-07 DIAGNOSIS — I509 Heart failure, unspecified: Secondary | ICD-10-CM

## 2013-09-07 LAB — RESPIRATORY VIRUS PANEL
ADENOVIRUS: NOT DETECTED
Influenza A H1: NOT DETECTED
Influenza A H3: NOT DETECTED
Influenza A: NOT DETECTED
Influenza B: NOT DETECTED
METAPNEUMOVIRUS: NOT DETECTED
PARAINFLUENZA 2 A: NOT DETECTED
Parainfluenza 1: NOT DETECTED
Parainfluenza 3: NOT DETECTED
RHINOVIRUS: NOT DETECTED
Respiratory Syncytial Virus A: NOT DETECTED
Respiratory Syncytial Virus B: NOT DETECTED

## 2013-09-07 LAB — FOLATE RBC: RBC FOLATE: 301 ng/mL (ref >280)

## 2013-09-07 LAB — PROTIME-INR
INR: 1.16 (ref 0.00–1.49)
Prothrombin Time: 14.6 seconds (ref 11.6–15.2)

## 2013-09-07 LAB — CARBOXYHEMOGLOBIN
Carboxyhemoglobin: 0.9 % (ref 0.5–1.5)
Methemoglobin: 1.7 % — ABNORMAL HIGH (ref 0.0–1.5)
O2 Saturation: 82.4 %
Total hemoglobin: 11.6 g/dL — ABNORMAL LOW (ref 12.0–16.0)

## 2013-09-07 LAB — BLOOD GAS, ARTERIAL
Acid-base deficit: 3.4 mmol/L — ABNORMAL HIGH (ref 0.0–2.0)
BICARBONATE: 23.8 meq/L (ref 20.0–24.0)
Drawn by: 103701
FIO2: 1 %
MECHVT: 440 mL
O2 SAT: 99.4 %
PATIENT TEMPERATURE: 98.6
PEEP: 5 cmH2O
PH ART: 7.266 — AB (ref 7.350–7.450)
RATE: 20 resp/min
TCO2: 21.8 mmol/L (ref 0–100)
pCO2 arterial: 54 mmHg — ABNORMAL HIGH (ref 35.0–45.0)
pO2, Arterial: 296 mmHg — ABNORMAL HIGH (ref 80.0–100.0)

## 2013-09-07 LAB — BASIC METABOLIC PANEL
BUN: 25 mg/dL — ABNORMAL HIGH (ref 6–23)
CO2: 27 meq/L (ref 19–32)
Calcium: 10.2 mg/dL (ref 8.4–10.5)
Chloride: 114 mEq/L — ABNORMAL HIGH (ref 96–112)
Creatinine, Ser: 1.17 mg/dL — ABNORMAL HIGH (ref 0.50–1.10)
GFR calc Af Amer: 56 mL/min — ABNORMAL LOW (ref >90)
GFR calc non Af Amer: 49 mL/min — ABNORMAL LOW (ref >90)
GLUCOSE: 166 mg/dL — AB (ref 70–99)
POTASSIUM: 4.1 meq/L (ref 3.7–5.3)
Sodium: 141 mEq/L (ref 137–147)

## 2013-09-07 LAB — CBC
HCT: 40.2 % (ref 36.0–46.0)
HEMOGLOBIN: 13 g/dL (ref 12.0–15.0)
MCH: 28.8 pg (ref 26.0–34.0)
MCHC: 32.3 g/dL (ref 30.0–36.0)
MCV: 89.1 fL (ref 78.0–100.0)
Platelets: 98 10*3/uL — ABNORMAL LOW (ref 150–400)
RBC: 4.51 MIL/uL (ref 3.87–5.11)
RDW: 16.1 % — ABNORMAL HIGH (ref 11.5–15.5)
WBC: 8.3 10*3/uL (ref 4.0–10.5)

## 2013-09-07 LAB — URINE CULTURE
Colony Count: NO GROWTH
Culture: NO GROWTH

## 2013-09-07 LAB — LEGIONELLA ANTIGEN, URINE: Legionella Antigen, Urine: NEGATIVE

## 2013-09-07 LAB — LACTIC ACID, PLASMA: Lactic Acid, Venous: 1.4 mmol/L (ref 0.5–2.2)

## 2013-09-07 LAB — PROCALCITONIN: Procalcitonin: 0.13 ng/mL

## 2013-09-07 LAB — GLUCOSE, CAPILLARY
GLUCOSE-CAPILLARY: 138 mg/dL — AB (ref 70–99)
GLUCOSE-CAPILLARY: 134 mg/dL — AB (ref 70–99)
Glucose-Capillary: 108 mg/dL — ABNORMAL HIGH (ref 70–99)
Glucose-Capillary: 114 mg/dL — ABNORMAL HIGH (ref 70–99)

## 2013-09-07 LAB — STREP PNEUMONIAE URINARY ANTIGEN: Strep Pneumo Urinary Antigen: POSITIVE — AB

## 2013-09-07 LAB — VITAMIN B12: VITAMIN B 12: 1298 pg/mL — AB (ref 211–911)

## 2013-09-07 LAB — HIV ANTIBODY (ROUTINE TESTING W REFLEX): HIV: NONREACTIVE

## 2013-09-07 LAB — TSH: TSH: 2.039 u[IU]/mL (ref 0.350–4.500)

## 2013-09-07 LAB — AMMONIA: Ammonia: 58 umol/L (ref 11–60)

## 2013-09-07 LAB — MAGNESIUM: MAGNESIUM: 2.3 mg/dL (ref 1.5–2.5)

## 2013-09-07 MED ORDER — SODIUM CHLORIDE 0.9 % IV BOLUS (SEPSIS)
500.0000 mL | Freq: Once | INTRAVENOUS | Status: AC
  Administered 2013-09-07: 500 mL via INTRAVENOUS

## 2013-09-07 MED ORDER — CHLORHEXIDINE GLUCONATE 0.12 % MT SOLN
15.0000 mL | Freq: Two times a day (BID) | OROMUCOSAL | Status: DC
  Administered 2013-09-07 – 2013-09-22 (×31): 15 mL via OROMUCOSAL
  Filled 2013-09-07 (×30): qty 15

## 2013-09-07 MED ORDER — MIDAZOLAM HCL 2 MG/2ML IJ SOLN
4.0000 mg | Freq: Once | INTRAMUSCULAR | Status: AC
  Administered 2013-09-07: 4 mg via INTRAVENOUS

## 2013-09-07 MED ORDER — VITAL HIGH PROTEIN PO LIQD
1000.0000 mL | ORAL | Status: DC
  Administered 2013-09-07 – 2013-09-09 (×4): 1000 mL
  Administered 2013-09-09: 20:00:00
  Administered 2013-09-10: 1000 mL
  Administered 2013-09-10: 16:00:00
  Administered 2013-09-11: 1000 mL
  Administered 2013-09-11: 06:00:00
  Administered 2013-09-13 – 2013-09-15 (×4): 1000 mL
  Filled 2013-09-07 (×11): qty 1000

## 2013-09-07 MED ORDER — VANCOMYCIN HCL IN DEXTROSE 750-5 MG/150ML-% IV SOLN
750.0000 mg | Freq: Two times a day (BID) | INTRAVENOUS | Status: DC
  Filled 2013-09-07: qty 150

## 2013-09-07 MED ORDER — NOREPINEPHRINE BITARTRATE 1 MG/ML IJ SOLN
2.0000 ug/min | INTRAVENOUS | Status: DC
  Administered 2013-09-07 (×2): 20 ug/min via INTRAVENOUS
  Administered 2013-09-07: 15 ug/min via INTRAVENOUS
  Administered 2013-09-07: 10 ug/min via INTRAVENOUS
  Filled 2013-09-07 (×2): qty 4

## 2013-09-07 MED ORDER — BIOTENE DRY MOUTH MT LIQD
15.0000 mL | Freq: Four times a day (QID) | OROMUCOSAL | Status: DC
  Administered 2013-09-07 – 2013-09-22 (×61): 15 mL via OROMUCOSAL

## 2013-09-07 MED ORDER — SODIUM CHLORIDE 0.9 % IV SOLN
0.0000 ug/h | INTRAVENOUS | Status: DC
  Administered 2013-09-07: 25 ug/h via INTRAVENOUS
  Administered 2013-09-08: 125 ug/h via INTRAVENOUS
  Administered 2013-09-09 – 2013-09-10 (×2): 150 ug/h via INTRAVENOUS
  Filled 2013-09-07 (×4): qty 50

## 2013-09-07 MED ORDER — MIDAZOLAM HCL 2 MG/2ML IJ SOLN
INTRAMUSCULAR | Status: AC
  Filled 2013-09-07: qty 4

## 2013-09-07 MED ORDER — SUCCINYLCHOLINE CHLORIDE 20 MG/ML IJ SOLN
INTRAMUSCULAR | Status: AC
  Filled 2013-09-07: qty 1

## 2013-09-07 MED ORDER — FENTANYL CITRATE 0.05 MG/ML IJ SOLN
100.0000 ug | INTRAMUSCULAR | Status: DC | PRN
  Administered 2013-09-07 (×2): 12.5 ug via INTRAVENOUS
  Administered 2013-09-07: 50 ug via INTRAVENOUS
  Administered 2013-09-07: 100 ug via INTRAVENOUS

## 2013-09-07 MED ORDER — DEXTROSE 5 % IV SOLN
2.0000 g | INTRAVENOUS | Status: AC
  Administered 2013-09-07 – 2013-09-15 (×9): 2 g via INTRAVENOUS
  Filled 2013-09-07 (×10): qty 2

## 2013-09-07 MED ORDER — ROCURONIUM BROMIDE 50 MG/5ML IV SOLN
INTRAVENOUS | Status: AC
  Administered 2013-09-07: 7 mg
  Filled 2013-09-07: qty 2

## 2013-09-07 MED ORDER — LIDOCAINE HCL (CARDIAC) 20 MG/ML IV SOLN
INTRAVENOUS | Status: AC
  Filled 2013-09-07: qty 5

## 2013-09-07 MED ORDER — PANTOPRAZOLE SODIUM 40 MG IV SOLR
40.0000 mg | Freq: Every day | INTRAVENOUS | Status: DC
  Administered 2013-09-07 – 2013-09-13 (×7): 40 mg via INTRAVENOUS
  Filled 2013-09-07 (×8): qty 40

## 2013-09-07 MED ORDER — FENTANYL BOLUS VIA INFUSION
50.0000 ug | INTRAVENOUS | Status: DC | PRN
  Filled 2013-09-07: qty 100

## 2013-09-07 MED ORDER — ALBUTEROL SULFATE (2.5 MG/3ML) 0.083% IN NEBU
2.5000 mg | INHALATION_SOLUTION | RESPIRATORY_TRACT | Status: DC | PRN
  Filled 2013-09-07: qty 3

## 2013-09-07 MED ORDER — INSULIN ASPART 100 UNIT/ML ~~LOC~~ SOLN
0.0000 [IU] | SUBCUTANEOUS | Status: DC
  Administered 2013-09-08 (×4): 1 [IU] via SUBCUTANEOUS
  Administered 2013-09-08 (×2): 2 [IU] via SUBCUTANEOUS
  Administered 2013-09-09 – 2013-09-13 (×13): 1 [IU] via SUBCUTANEOUS
  Administered 2013-09-13: 2 [IU] via SUBCUTANEOUS
  Administered 2013-09-14 (×4): 1 [IU] via SUBCUTANEOUS
  Administered 2013-09-14 – 2013-09-15 (×3): 2 [IU] via SUBCUTANEOUS
  Administered 2013-09-15: 1 [IU] via SUBCUTANEOUS
  Administered 2013-09-15: 03:00:00 via SUBCUTANEOUS
  Administered 2013-09-15 (×2): 1 [IU] via SUBCUTANEOUS
  Administered 2013-09-15: 2 [IU] via SUBCUTANEOUS
  Administered 2013-09-16: 1 [IU] via SUBCUTANEOUS

## 2013-09-07 MED ORDER — FENTANYL CITRATE 0.05 MG/ML IJ SOLN
INTRAMUSCULAR | Status: AC
  Filled 2013-09-07: qty 4

## 2013-09-07 MED ORDER — CEFTRIAXONE SODIUM 1 G IJ SOLR
2.0000 g | INTRAMUSCULAR | Status: DC
  Filled 2013-09-07: qty 20

## 2013-09-07 MED ORDER — DEXTROSE 5 % IV SOLN
2.0000 ug/min | INTRAVENOUS | Status: DC
Start: 2013-09-07 — End: 2013-09-10
  Administered 2013-09-07: 20 ug/min via INTRAVENOUS
  Administered 2013-09-08: 30 ug/min via INTRAVENOUS
  Administered 2013-09-08: 40 ug/min via INTRAVENOUS
  Administered 2013-09-08: 30 ug/min via INTRAVENOUS
  Administered 2013-09-08: 30.027 ug/min via INTRAVENOUS
  Administered 2013-09-08: 30 ug/min via INTRAVENOUS
  Administered 2013-09-09: 25 ug/min via INTRAVENOUS
  Administered 2013-09-09: 16 ug/min via INTRAVENOUS
  Administered 2013-09-09: 18 ug/min via INTRAVENOUS
  Administered 2013-09-09: 24 ug/min via INTRAVENOUS
  Administered 2013-09-10: 16 ug/min via INTRAVENOUS
  Filled 2013-09-07 (×10): qty 8

## 2013-09-07 MED ORDER — ETOMIDATE 2 MG/ML IV SOLN
INTRAVENOUS | Status: AC
  Administered 2013-09-07: 20 mg
  Filled 2013-09-07: qty 20

## 2013-09-07 MED ORDER — SODIUM CHLORIDE 0.9 % IV BOLUS (SEPSIS)
1000.0000 mL | Freq: Once | INTRAVENOUS | Status: AC
  Administered 2013-09-07: 1000 mL via INTRAVENOUS

## 2013-09-07 NOTE — Progress Notes (Signed)
  CARE MANAGEMENT NOTE 09/07/2013  Patient:  Sara, GRANDMAISON Howe   Account Number:  [id number]  Date Initiated:  09/07/2013  Documentation initiated by:  DAVIS,RHONDA  Subjective/Objective Assessment:   patient with resp failure intubation on [id number], iv pressors/hx of Light chain multiple myeloma who presented with acute renal failure, elevated serum light chain and lytic bony lesions in June 2008. She had Howe free kappa light chain around     Action/Plan:   tbd   Anticipated DC Date:  09/10/2013   Anticipated DC Plan:           Choice offered to / List presented to:             Status of service:  In process, will continue to follow Medicare Important Message given?  NA - LOS <3 / Initial given by admissions (If response is "NO", the following Medicare IM given date fields will be blank) Date Medicare IM given:   Date Additional Medicare IM given:    Discharge Disposition:    Per UR Regulation:  Reviewed for med. necessity/level of care/duration of stay  If discussed at [place] of Stay Meetings, dates discussed:    Comments:  [id number]/Rhonda Eldridge Dace, [place], [place] [phone] Chart Reviewed for discharge and hospital needs. Discharge needs at time of review:  None present will follow for needs. Review of patient progress due on [id number]. Intubated am of [id number]

## 2013-09-07 NOTE — Progress Notes (Signed)
PT Cancellation Note  Patient Details Name: [name] MRN: [id number] DOB: Sep 12, 1950   Cancelled Treatment:    Reason Eval/Treat Not Completed: Medical issues which prohibited therapy (intubated. Check back tomorrow. ? sign off .)   Claretha Cooper 09/07/2013, 1:25 PM Tresa Endo PT [phone]

## 2013-09-07 NOTE — Progress Notes (Signed)
Called by RN regarding labile blood pressure with fentanyl administration.  Reports significant decrease with fentanyl pushes, then patient arouses and is uncomfortable & becomes hypertensive.   Plan: -low dose fentanyl gtt for steady state  -levophed gtt as needed for MAP >65   [name], NP-C [facility] Pgr: [phone] or [phone]

## 2013-09-07 NOTE — Progress Notes (Signed)
[facility] PROGRESS NOTE  [name] IWL:[id number] DOB: April 10, 1951 DOA: 08/26/2013  PCP: Unknown  Oncologist: Dr. Alen Blew  Brief HPI: [name] is a 63 y.o. female With a history of multiple myeloma, CHF, depression and anxiety who presented to the emergency department with weakness and fatigue. She also had trouble swallowing. She has also been confused at times. She was diagnosed with pneumonia and admitted to hospital.  Past medical history:  Past Medical History  Diagnosis Date  . Small bowel obstruction   . Diabetes mellitus   . Hypertension   . Hyperlipidemia   . Chronic renal insufficiency   . Multiple myeloma   . Abdominal pain   . Ventral hernia   . Congestive heart failure   . GERD (gastroesophageal reflux disease)   . Renal insufficiency   . Obesity   . Depression   . Anxiety   . Abscess     groin    Consultants: Onc notified  Procedures: None  Antibiotics: Cefepime 3/16--> Vanc 3/16-->  Subjective: Patient unresponsive this morning.   Objective: Vital Signs  Filed Vitals:   09/07/13 0245 09/07/13 0300 09/07/13 0330 09/07/13 0400  BP: 143/73 151/84 141/80 148/79  Pulse:      Temp:    98.3 F (36.8 C)  TempSrc:    Axillary  Resp: _0 34  Weight:    87.8 kg (193 lb 9 oz)  SpO2:    95%    Intake/Output Summary (Last 24 hours) at 09/07/13 0740 Last data filed at 09/07/13 0600  Gross per 24 hour  Intake   1200 ml  Output      0 ml  Net   1200 ml   Filed Weights   09/07/13 0400  Weight: 87.8 kg (193 lb 9 oz)    General appearance: unresponsive even to painful stimuli. Head: Normocephalic, without obvious abnormality, atraumatic Resp: shallow breaths noted. Crackles bilateral bases. No wheezing. Cardio: regular rate and rhythm, S1, S2 normal, no murmur, click, rub or gallop GI: soft, non-tender; bowel sounds normal; no masses,  no organomegaly Extremities: extremities normal, atraumatic, no cyanosis or  edema Skin: Skin color, texture, turgor normal. No rashes or lesions Neurologic: Unresponsive. Moving extremities occasionally.  Lab Results:  Basic Metabolic Panel:  Recent Labs Lab 08/27/2013 1650 09/07/13 0336  NA 147 141  K 4.4 4.1  CL 107 114*  CO2 24 27  GLUCOSE 91 166*  BUN 25* 25*  CREATININE 1.02 1.17*  CALCIUM 10.9* 10.2  MG  --  2.3   Liver Function Tests:  Recent Labs Lab 08/26/2013 1650  AST 41*  ALT 25  ALKPHOS 63  BILITOT 0.8  PROT 8.3  ALBUMIN 2.7*   No results found for this basename: LIPASE, AMYLASE,  in the last 168 hours  Recent Labs Lab 09/07/13 0337  AMMONIA 58   CBC:  Recent Labs Lab 09/11/2013 1650 09/07/13 0336  WBC 5.8 8.3  NEUTROABS 5.0  --   HGB 13.9 13.0  HCT 41.5 40.2  MCV 88.1 89.1  PLT 88* 98*   BNP (last 3 results)  Recent Labs  09/17/2013 2250  PROBNP 8205.0*   CBG:  Recent Labs Lab 09/13/2013 1534 09/01/2013 1811  GLUCAP 63* 120*    Recent Results (from the past 240 hour(s))  MRSA PCR SCREENING     Status: None   Collection Time    09/21/2013 10:04 PM      Result Value Ref Range Status  MRSA by PCR NEGATIVE  NEGATIVE Final   Comment:            The GeneXpert MRSA Assay (FDA     approved for NASAL specimens     only), is one component of a     comprehensive MRSA colonization     surveillance program. It is not     intended to diagnose MRSA     infection nor to guide or     monitor treatment for     MRSA infections.      Studies/Results: Dg Chest 2 View  08/24/2013   CLINICAL DATA:  Shortness of breath, history multiple myeloma, hypertension, diabetes, CHF, smoking  EXAM: CHEST  2 VIEW  COMPARISON:  09/20/2013 at 1555 hr  FINDINGS: Right jugular Port-A-Cath stable with tip projecting over SVC.  Borderline cardiac enlargement.  Mediastinal contours and pulmonary vascularity normal.  Lungs clear.  Atherosclerotic calcification aorta.  Retrocardiac left lower lobe atelectasis versus consolidation.  Lungs  otherwise clear.  No pleural effusion or pneumothorax.  Osseous demineralization with multilevel endplate spur formation thoracic spine and dextro convex scoliosis.  IMPRESSION: Atelectasis versus consolidation in retrocardiac left lower lobe.   Electronically Signed   By: Lavonia Dana M.D.   On: 09/01/2013 17:12   Ct Head Wo Contrast  09/15/2013   CLINICAL DATA:  Weakness and fatigue.  Multiple myeloma.  EXAM: CT HEAD WITHOUT CONTRAST  TECHNIQUE: Contiguous axial images were obtained from the base of the skull through the vertex without intravenous contrast.  COMPARISON:  CT HEAD W/O CM dated 01/31/2011; CT ABD/PELV WO CM dated 02/02/2011; DG HIP COMPLETE*L* dated 01/30/2012; MR EXTREM LOW*R* W/O CM dated 01/24/2007  FINDINGS: Empty or partially empty sella. The brainstem, cerebellum, cerebral peduncles, thalamus, basal ganglia, basilar cisterns, and ventricular system appear within normal limits. No intracranial hemorrhage, mass lesion, or acute CVA. Continued abnormal sclerosis along the left middle cranial fossa and temporal bone, with thickening and sclerosis as before. Generalized calvarial thickening with extensive heterogeneity and punctate lucencies similar to prior exam.  IMPRESSION: 1. No acute intracranial findings. 2. Similar appearance of empty or partially empty sella compared to prior exam from 01/31/2011. 3. Stable calvarial findings indicative of myelomatous involvement. Stable cortical thickening, sclerosis, and spiculated cortical margins in the squamosal portion of the left temporal bone, unchanged and possibly from Paget's disease or an unusual manifestation of myeloma.   Electronically Signed   By: Sherryl Barters M.D.   On: 08/31/2013 16:48   Dg Chest Port 1 View  09/05/2013   CLINICAL DATA:  Weakness, shortness of breath  EXAM: PORTABLE CHEST - 1 VIEW  COMPARISON:  DG CHEST 2 VIEW dated 01/31/2011; CT ABSCESS DRAINAGE dated 12/12/2010; CT ABD/PELV WO CM dated 12/21/2010; CT ABD/PELVIS W CM dated  03/27/2011  FINDINGS: There is mild bilateral interstitial prominence, likely chronic. There is left retrocardiac hazy airspace disease which may reflect atelectasis versus developing pneumonia. There is no other focal parenchymal opacity. There is a trace left pleural effusion. The heart and mediastinal contours are unremarkable. There is a right-sided Port-A-Cath in satisfactory position.  The osseous structures are unremarkable.  IMPRESSION: 1. There is hazy left lower lobe retrocardiac airspace opacity which may reflect atelectasis versus developing pneumonia. Further evaluation with dedicated PA and lateral chest radiographs recommended.   Electronically Signed   By: Kathreen Devoid   On: 09/04/2013 16:14    Medications:  Scheduled: . ceFEPime (MAXIPIME) IV  1 g Intravenous 3 times per day  .  magic mouthwash w/lidocaine  5 mL Oral QID  . vancomycin  1,250 mg Intravenous Q12H   Continuous: . sodium chloride 75 mL/hr at 08/24/2013 2200   VQQ:[id number]  Assessment/Plan:  Principal Problem:   Sepsis Active Problems:   Multiple myeloma   Hypercalcemia   HCAP (healthcare-associated pneumonia)   Dysphagia   CHF with unknown LVEF   Unresponsive I am worried that she could be hypercapneic considering her shallow respirations. Stat ABG wil be ordered. She may need to be intubated. CT head done last night did not show any acute changes. Last received Ativan at 11Pm. Will consult PCCM.  Sepsis secondary to healthcare associated pneumonia/Strep Pneumonia  Patient's BP is stable. She was mildly febrile yesterday. Urine positive for Strep Ag. Viral Panel is pending. CXR raised possibility of pneumonia in left lung. Continue Vanc and cefepime. Blood cultures pending. Patient refused CT angio last night. May have to be reconsidered today. But she needs to be more stable from respiratory perspective.   Acute encephalopathy  Likely secondary to the above. CT of the head did not show any acute  intracranial findings.   Bacteriuria  Await urine culture  Dysphagia  She admitted to problems with drinking and eating. This will need to be evaluated when she is able to take orally.  Generalized weakness  Likely due to infectious illness. PT/OT when medically stable.   History of Multiple myeloma with thrombocytopenia and hypercalcemia  On no treatment as of January 2015. Received Zometa on 08/27/13.  Congestive heart failure  No documented EF. Per patient she has CHF. Will continue to monitor her daily weights, intake as well as output. BNP was elevated. Will need repeat CXR but will wait for ABG first.  Anxiety/depression  Holding her medications.   Diabetes mellitus  Patient appears to be on no home medications. Hemoglobin A1c is pending.  Hypertension  Medications held due to hypotension yesterday. Continue to hold.   History of DVT 2011 or 2012 No longer on warfarin per Onc notes though not clear. Will check PT/INR  RN informed me that her BP was low in 70's. Fluid bolus ordered. Patient is critically ill at this time. PCCM arrived at bedside. She was subsequently intubated. Care discussed with Dr. Lake Bells. He will assume care. He will notify family. TRH will be available once patient is ready for transfer to floor.  Code Status: Full Code  DVT Prophylaxis: SCD's    Family Communication: No family at bedside  Disposition Plan: Remain in ICU/Stepdown    LOS: 1 day   [facility] Pager [phone] 09/07/2013, 7:40 AM  If 8PM-8AM, please contact night-coverage at [email or web address], password [facility]

## 2013-09-07 NOTE — Procedures (Signed)
Intubation Procedure Note [name] [id number] Mar 12, 1951  Procedure: Intubation Indications: Respiratory insufficiency  Procedure Details Consent: Risks of procedure as well as the alternatives and risks of each were explained to the (patient/caregiver).  Consent for procedure obtained. Time Out: Verified patient identification, verified procedure, site/side was marked, verified correct patient position, special equipment/implants available, medications/allergies/relevent history reviewed, required imaging and test results available.  Performed  Drugs Fentanyl 41mcg, Versed 2mg , Etomidate 20mg , Rocuronium 70mg  DL x 1 with GS3 blade Grade 1 view 7.5 tube passed through cords under direct visualization Placement confirmed with bilateral breath sounds, positive EtCO2 change and smoke in tube   Evaluation Hemodynamic Status: Transient hypotension treated with pressors and treated with fluid; O2 sats: stable throughout Patient's Current Condition: stable Complications: No apparent complications Patient did tolerate procedure well. Chest X-ray ordered to verify placement.  CXR: pending.   Simonne Maffucci 09/07/2013

## 2013-09-07 NOTE — Progress Notes (Signed)
INITIAL NUTRITION ASSESSMENT  DOCUMENTATION CODES Per approved criteria  -Severe malnutrition in the context of acute illness or injury -Obesity Unspecified  Patient meets criteria for severe malnutrition related to chronic illness AEB 12% weight loss in <2 months, <75% intake for > 1 month.  INTERVENTION:  Continue Vital High Protein with goal of 50 ml/hr to provide 1200 kcal and 105 gm protein daily.   Increase 10 ml every 4 hours until goal is reached.    RD to monitor.  NUTRITION DIAGNOSIS: Inadequate oral intake related to inability to eat as evidenced by NPO status.   Goal: Enteral nutrition to provide 60-70% of estimated calorie needs (22-25 kcals/kg ideal body weight) and 100% of estimated protein needs, based on ASPEN guidelines for permissive underfeeding in critically ill obese individuals.  Monitor:  TF tolerance, adequacy, labs, weight trend  Reason for Assessment: TF Management Consult  63 y.o. female  Admitting Dx: Sepsis  ASSESSMENT: Patient with a 3 week hx of weakness, decreased appetite and difficulty swallowing.  Found to have positive strep antigen with concern for L retrocardiac infiltrate.  Requiring intubation today due to hypercarbic respiratory failure.  PNA and metabolic encephalopathy.  Hx of light chain multiple myeloma and received Kyprolis receiving last therapy on 1 06/2013 and Zometa monthly.  Per weight hx, patient with a 22% weight loss in the past 9 months.  Patient is currently intubated on ventilator support.  MV: 9.6 L/min Temp (24hrs), Avg:97.9 F (36.6 C), Min:96.2 F (35.7 C), Max:98.7 F (37.1 C)  Patient has been started on the TF protocol and Vital High Protein is running at 20 ml/hr via oral feeding tube.     Height: Ht Readings from Last 1 Encounters:  09/07/13 _0  (1.626 m)    Weight: Wt Readings from Last 1 Encounters:  09/07/13 193 lb 9 oz (87.8 kg)    Ideal Body Weight: 120 lbs  % Ideal Body Weight: 161  Wt  Readings from Last 10 Encounters:  09/07/13 193 lb 9 oz (87.8 kg)  07/22/13 218 lb 12.8 oz (99.247 kg)  11/25/12 249 lb 12.8 oz (113.309 kg)  11/20/12 251 lb 14.4 oz (114.261 kg)  11/09/12 226 lb (102.513 kg)  07/21/12 264 lb 14.4 oz (120.158 kg)  04/24/12 262 lb 1.6 oz (118.888 kg)  03/10/12 249 lb 6.4 oz (113.127 kg)  02/25/12 245 lb 9.6 oz (111.403 kg)  02/11/12 243 lb 12.8 oz (110.587 kg)    Usual Body Weight: 249 lbs 11/2012  % Usual Body Weight: 77.5  BMI:  Body mass index is 33.21 kg/(m^2).  Estimated Nutritional Needs: Kcal: 1643 Protein: 90-100 gm Fluid: 1.8L  Skin: intact  Diet Order: NPO  EDUCATION NEEDS: -No education needs identified at this time   Intake/Output Summary (Last 24 hours) at 09/07/13 1747 Last data filed at 09/07/13 0600  Gross per 24 hour  Intake   1200 ml  Output      0 ml  Net   1200 ml    Last BM: unknown  Labs:   Recent Labs Lab 08/23/2013 1650 09/07/13 0336  NA 147 141  K 4.4 4.1  CL 107 114*  CO2 24 27  BUN 25* 25*  CREATININE 1.02 1.17*  CALCIUM 10.9* 10.2  MG  --  2.3  GLUCOSE 91 166*    CBG (last 3)   Recent Labs  09/21/2013 1811 09/07/13 1137 09/07/13 1637  GLUCAP 120* 138* 108*    Scheduled Meds: . antiseptic oral rinse  15 mL Mouth Rinse QID  . cefTRIAXone (ROCEPHIN)  IV  2 g Intravenous Q24H  . chlorhexidine  15 mL Mouth Rinse BID  . feeding supplement (VITAL HIGH PROTEIN)  1,000 mL Per Tube Q24H  . fentaNYL      . insulin aspart  0-9 Units Subcutaneous 6 times per day  . lidocaine (cardiac) 100 mg/51m      . magic mouthwash w/lidocaine  5 mL Oral QID  . midazolam      . pantoprazole (PROTONIX) IV  40 mg Intravenous Daily  . succinylcholine        Continuous Infusions: . sodium chloride 75 mL/hr at 09/07/13 1041  . fentaNYL infusion INTRAVENOUS 25 mcg/hr (09/07/13 1525)  . norepinephrine (LEVOPHED) Adult infusion 10 mcg/min (09/07/13 1400)    Past Medical History  Diagnosis Date  . Small  bowel obstruction   . Diabetes mellitus   . Hypertension   . Hyperlipidemia   . Chronic renal insufficiency   . Multiple myeloma   . Abdominal pain   . Ventral hernia   . Congestive heart failure   . GERD (gastroesophageal reflux disease)   . Renal insufficiency   . Obesity   . Depression   . Anxiety   . Abscess     groin    Past Surgical History  Procedure Laterality Date  . Hernia repair      ventral hernia  . Exploratory laparotomy    . Oophorectomy    . Breast surgery      reduction  . Panniculectomy    . Incise and drain abcess      groin    LAntonieta Iba RD, LDN Clinical Inpatient Dietitian Pager:  [phone] and after hours pager:  [phone]

## 2013-09-07 NOTE — Progress Notes (Signed)
OT Cancellation Note  Patient Details Name: [name] MRN: [id number] DOB: 05/25/1951   Cancelled Treatment:    Reason Eval/Treat Not Completed: Medical issues which prohibited therapy  Pt intubated.  Will check back tomorrow.   Jazmon Kos 09/07/2013, 1:30 PM Lesle Chris, OTR/L [phone] 09/07/2013

## 2013-09-07 NOTE — Progress Notes (Signed)
IP PROGRESS NOTE  Subjective:   Patient known to me with the following issues:  Principle Diagnosis:  1. 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 She had a free kappa light chain around 1500. 2. Diagnosis of deep vein thrombosis in July 2011. Prior Therapy:  1. Initially treated with melphalan and prednisone. She had a partial response with free light chains down as low as 19, M spike down to 0.52 g/dL.  2. Patient treated with Revlimid at 25 mg 3 weeks on and 1 week off between November 2009 until November 2010. She had a partial response, light chains down to 33 and M spike was not detected.  3. She was treated on maintenance Revlimid 15 mg to maintain her partial response. 4. She was on Zometa intermittently, on hold know. 5. Currently anticoagulated with Coumadin 7.5 mg since July 2011. 6. She received Velcade 1.3 mg/sq m for a total of 2.9 mg weekly, which started on 02/21/2011 with weekly dexamethasone . Treatment has been on hold due to recurrent hospitalization for wound infection. Treatment restarted in in 06/2011 till 09/2011. 7. Kyprolis started on 02/04/12. She completed 6 months of therapy on 07/08/12  Current therapy: Resumed Kyprolis on 11/25/12. She is S/P 14 cycles completed in 06/2013. Now on a treatment break.  Receives Zometa 4 mg monthly.  She was hospitalized on 09/04/2013 for weakness, fatigue and failure to thrive and developed hypotension and shock in the morning of 09/07/2013. She was intubated currently on pressors.  Objective:  Vital signs in last 24 hours: Temp:  [96.2 F (35.7 C)-100.6 F (38.1 C)] 96.2 F (35.7 C) (03/17 1200) Pulse Rate:  [51-140] 102 (03/17 0830) Resp:  [0-34] 22 (03/17 0830) BP: (52-153)/(33-92) 52/42 mmHg (03/17 0830) SpO2:  [90 %-100 %] 100 % (03/17 0830) FiO2 (%):  [50 %-100 %] 50 % (03/17 1035) Weight:  [193 lb 9 oz (87.8 kg)] 193 lb 9 oz (87.8  kg) (03/17 0400) Weight change:     Intake/Output from previous day: 03/16 0701 - 03/17 0700 In: 1200 [I.V.:600; IV Piggyback:600] Out: -  Intubated and sedated not in any distress. Mouth: No lesions noted on the exterior part of her lips. Resp: rhonchi bilaterally Cardio: regular rate and rhythm, S1, S2 normal, no murmur, click, rub or gallop GI: soft, non-tender; bowel sounds normal; no masses,  no organomegaly Extremities: extremities normal, atraumatic, no cyanosis or edema  Portacath without erythema  Lab Results:  Recent Labs  09/20/2013 1650 09/07/13 0336  WBC 5.8 8.3  HGB 13.9 13.0  HCT 41.5 40.2  PLT 88* 98*    BMET  Recent Labs  09/01/2013 1650 09/07/13 0336  NA 147 141  K 4.4 4.1  CL 107 114*  CO2 24 27  GLUCOSE 91 166*  BUN 25* 25*  CREATININE 1.02 1.17*  CALCIUM 10.9* 10.2   Current Facility-Administered Medications  Medication Dose Route Frequency Provider Last Rate Last Dose  . 0.9 %  sodium chloride infusion   Intravenous Continuous Maryann Mikhail, DO 75 mL/hr at 09/07/13 1041    . albuterol (PROVENTIL) (2.5 MG/3ML) 0.083% nebulizer solution 2.5 mg  2.5 mg Nebulization Q2H PRN Juanito Doom, MD      . antiseptic oral rinse (BIOTENE) solution 15 mL  15 mL Mouth Rinse QID Juanito Doom, MD   15 mL at 09/07/13 0830  . cefTRIAXone (ROCEPHIN) 2 g in dextrose 5 % 50  mL IVPB  2 g Intravenous Q24H Juanito Doom, MD   2 g at 09/07/13 1040  . chlorhexidine (PERIDEX) 0.12 % solution 15 mL  15 mL Mouth Rinse BID Juanito Doom, MD      . feeding supplement (VITAL HIGH PROTEIN) liquid 1,000 mL  1,000 mL Per Tube Q24H Brandi L Ollis, NP      . fentaNYL (SUBLIMAZE) 0.05 MG/ML injection           . fentaNYL (SUBLIMAZE) injection 100 mcg  100 mcg Intravenous Q2H PRN Juanito Doom, MD   100 mcg at 09/07/13 1139  . insulin aspart (novoLOG) injection 0-9 Units  0-9 Units Subcutaneous 6 times per day Donita Brooks, NP      . lidocaine (cardiac) 100  mg/51m (XYLOCAINE) 20 MG/ML injection 2%           . lidocaine-prilocaine (EMLA) cream   Topical PRN Maryann Mikhail, DO      . magic mouthwash w/lidocaine  5 mL Oral QID Maryann Mikhail, DO      . midazolam (VERSED) 2 MG/2ML injection           . norepinephrine (LEVOPHED) 4 mg in dextrose 5 % 250 mL infusion  2-50 mcg/min Intravenous Titrated BDonita Brooks NP      . pantoprazole (PROTONIX) injection 40 mg  40 mg Intravenous Daily DJuanito Doom MD   40 mg at 09/07/13 1045  . succinylcholine (ANECTINE) 20 MG/ML injection            Facility-Administered Medications Ordered in Other Encounters  Medication Dose Route Frequency Provider Last Rate Last Dose  . 0.9 %  sodium chloride infusion   Intravenous Once FWyatt Portela MD      . sodium chloride 0.9 % injection 10 mL  10 mL Intracatheter PRN FWyatt Portela MD   10 mL at 03/17/12 1630  . sodium chloride 0.9 % injection 10 mL  10 mL Intracatheter PRN FWyatt Portela MD   10 mL at 02/25/13 1628     Studies/Results: Dg Chest 2 View  08/30/2013   CLINICAL DATA:  Shortness of breath, history multiple myeloma, hypertension, diabetes, CHF, smoking  EXAM: CHEST  2 VIEW  COMPARISON:  08/22/2013 at 1555 hr  FINDINGS: Right jugular Port-A-Cath stable with tip projecting over SVC.  Borderline cardiac enlargement.  Mediastinal contours and pulmonary vascularity normal.  Lungs clear.  Atherosclerotic calcification aorta.  Retrocardiac left lower lobe atelectasis versus consolidation.  Lungs otherwise clear.  No pleural effusion or pneumothorax.  Osseous demineralization with multilevel endplate spur formation thoracic spine and dextro convex scoliosis.  IMPRESSION: Atelectasis versus consolidation in retrocardiac left lower lobe.   Electronically Signed   By: MLavonia DanaM.D.   On: 09/16/2013 17:12   Ct Head Wo Contrast  09/10/2013   CLINICAL DATA:  Weakness and fatigue.  Multiple myeloma.  EXAM: CT HEAD WITHOUT CONTRAST  TECHNIQUE: Contiguous axial  images were obtained from the base of the skull through the vertex without intravenous contrast.  COMPARISON:  CT HEAD W/O CM dated 01/31/2011; CT ABD/PELV WO CM dated 02/02/2011; DG HIP COMPLETE*L* dated 01/30/2012; MR EXTREM LOW*R* W/O CM dated 01/24/2007  FINDINGS: Empty or partially empty sella. The brainstem, cerebellum, cerebral peduncles, thalamus, basal ganglia, basilar cisterns, and ventricular system appear within normal limits. No intracranial hemorrhage, mass lesion, or acute CVA. Continued abnormal sclerosis along the left middle cranial fossa and temporal bone, with thickening and sclerosis as before.  Generalized calvarial thickening with extensive heterogeneity and punctate lucencies similar to prior exam.  IMPRESSION: 1. No acute intracranial findings. 2. Similar appearance of empty or partially empty sella compared to prior exam from 01/31/2011. 3. Stable calvarial findings indicative of myelomatous involvement. Stable cortical thickening, sclerosis, and spiculated cortical margins in the squamosal portion of the left temporal bone, unchanged and possibly from Paget's disease or an unusual manifestation of myeloma.   Electronically Signed   By: Sherryl Barters M.D.   On: 09/04/2013 16:48   Dg Chest Port 1 View  09/07/2013   CLINICAL DATA:  Check endotracheal tube placement  EXAM: PORTABLE CHEST - 1 VIEW  COMPARISON:  09/12/2013  FINDINGS: Cardiac shadow is stable. A right-sided chest wall port is again seen and stable. A new left jugular central venous line is noted with the tip in the mid superior vena cava. No pneumothorax is noted. An endotracheal tube is seen with the tip 4.1 cm above the carina. Increased density is noted in the left lung base which has progressed in the interval from the prior exam. Significant rotation to the right accentuates the mediastinal markings.  IMPRESSION: New endotracheal tube placement and left jugular central line as described.  Increasing left basilar infiltrate.    Electronically Signed   By: Inez Catalina M.D.   On: 09/07/2013 09:41   Dg Chest Port 1 View  09/17/2013   CLINICAL DATA:  Weakness, shortness of breath  EXAM: PORTABLE CHEST - 1 VIEW  COMPARISON:  DG CHEST 2 VIEW dated 01/31/2011; CT ABSCESS DRAINAGE dated 12/12/2010; CT ABD/PELV WO CM dated 12/21/2010; CT ABD/PELVIS W CM dated 03/27/2011  FINDINGS: There is mild bilateral interstitial prominence, likely chronic. There is left retrocardiac hazy airspace disease which may reflect atelectasis versus developing pneumonia. There is no other focal parenchymal opacity. There is a trace left pleural effusion. The heart and mediastinal contours are unremarkable. There is a right-sided Port-A-Cath in satisfactory position.  The osseous structures are unremarkable.  IMPRESSION: 1. There is hazy left lower lobe retrocardiac airspace opacity which may reflect atelectasis versus developing pneumonia. Further evaluation with dedicated PA and lateral chest radiographs recommended.   Electronically Signed   By: Kathreen Devoid   On: 08/28/2013 16:14   Dg Abd Portable 1v  09/07/2013   CLINICAL DATA:  Orogastric tube placement assessment  EXAM: PORTABLE ABDOMEN - 1 VIEW  COMPARISON:  None.  FINDINGS: The orogastric tube is coiled within the stomach. The proximal port and tip lie below the level of the GE junction.  The bowel gas pattern is nonspecific.  IMPRESSION: The orogastric tube tip and proximal port appear to be in reasonable position within the gastric fundus.   Electronically Signed   By: David  Martinique   On: 09/07/2013 10:10    Medications: I have reviewed the patient's current medications.  Assessment/Plan:  63 year old with the following issues:  1. Relapsing multiple myeloma: She had a long history of multiple myeloma dating back to 2008. She has been getting intermittent treatments and in the last few weeks she was exhibiting signs of relapse and would have required restart of treatment in the near future. She had  multiple regimens in the past and I was planning to treat her with Pomalyst but certainly this will be on hold now. Certainly her hypercalcemia and thrombocytopenia is related to relapsing multiple myeloma but this is not new for her and consistent with her baseline findings at times.   The plan is to monitor  her clinical progress for the time being and certainly will defer treatments until she recovers from the current episode. If she does not recover or she develops multi-organ failure from this episode, she might not be a candidate for multiple myeloma treatment. Her prognosis for multiple myeloma is guarded even prior to her recent illness.  2. Septic shock: I am presuming this is related left lobe pneumonia. She is currently intubated and sedated with pressors to support her blood pressure. Certainly her immune dysregulation related to multiple myeloma plays a role in her susceptibility to see infection.   My recommendation is to continue with the current management with aggressive treatment. If there is no signs of recovery or she develops multiorgan failure then reevaluation of goals of care is very appropriate.  3. Hypercalcemia: This is related to her light chain multiple myeloma with disease to the bone. She received a Zometa on 08/27/2013 and her calcium have improved. Her most recent calcium is at 10.2  4. Thrombocytopenia: Likely related to her multiple myeloma on myeloma treatment. Her platelet count is currently at baseline without any clinical signs of bleeding.   LOS: 1 day   [facility] 09/07/2013, 12:41 PM

## 2013-09-07 NOTE — Evaluation (Signed)
SLP Cancellation Note  Patient Details Name: MARIYANA FULOP MRN: [id number] DOB: 1950-07-28   Cancelled treatment:       Reason Eval/Treat Not Completed: Medical issues which prohibited therapy (pt intubated, please re-order when/if desire)   Luanna Salk, [facility] SLP [phone]

## 2013-09-07 NOTE — Progress Notes (Signed)
[name] PCCM Pager: [phone] Cell: [phone] If no response, call [phone]

## 2013-09-07 NOTE — Procedures (Signed)
Central Venous Catheter Insertion Procedure Note SHERRINE SALBERG [id number] [birth year]  Procedure: Insertion of Central Venous Catheter Indications: Assessment of intravascular volume, Drug and/or fluid administration and Frequent blood sampling  Procedure Details Consent: Unable to obtain consent because of emergent medical necessity. Time Out: Verified patient identification, verified procedure, site/side was marked, verified correct patient position, special equipment/implants available, medications/allergies/relevent history reviewed, required imaging and test results available.  Performed  Maximum sterile technique was used including antiseptics, cap, gloves, gown, hand hygiene, mask and sheet. Skin prep: Chlorhexidine; local anesthetic administered A antimicrobial bonded/coated triple lumen catheter was placed in the left internal jugular vein using the Seldinger technique.  Evaluation Blood flow good Complications: No apparent complications Patient did tolerate procedure well. Chest X-ray ordered to verify placement.  CXR: pending.   Procedure performed under direct supervision of Dr. Lake Bells and with ultrasound guidance for real time vessel cannulation.     [name], NP-C [facility] Pgr: [phone] or [phone]  09/07/2013, 10:36 AM  [name] PCCM Pager: [phone] Cell: [phone] If no response, call [phone]

## 2013-09-07 NOTE — Consult Note (Signed)
PULMONARY / CRITICAL CARE MEDICINE   Name: Sara GRITZ MRN: [id number] DOB: 26-Mar-1951    ADMISSION DATE:  09/03/2013 CONSULTATION DATE:  09/07/13  REFERRING MD :  Dr. [place] Pink PRIMARY SERVICE: [facility]-->PCCM  CHIEF COMPLAINT:  Acute Respiratory Failure  BRIEF PATIENT DESCRIPTION: 63 y/o F admitted with 3 wk hx of weakness, decreased appetite & difficulty swallowing.  Found to have positive strep antigen with concern for L retrocardiac infiltrate.  Progressed to hypercarbic respiratory failure, PCCM consulted.  SIGNIFICANT EVENTS / STUDIES:  3/16 - Admit with 3 wk hx of weakness, decreased appetite & difficulty swallowing.  Found to have positive strep antigen with concern for L retrocardiac infiltrate.  3/17 - Hypercarbic respiratory failure, intubated early am  LINES / TUBES: OETT 3/17>>> L IJ TLC 3/17>>> Port-A-Cath 3/17>>>  CULTURES: BCx2 3/16>>> RVP 3/16>>> Sputum 3/16>>> UC 3/16>>> UA 3/16>>>3-6 wbc, few bacteria.  Asymptomatic on admit. Strep Pneumo 3/16>>>positive  ANTIBIOTICS: Azithro 3/16>>>3/17 Cefepime 3/16>>>3/17 Rocephin 3/16>>>  HISTORY OF PRESENT ILLNESS:  63 y/o F with a PMH of DM, HTN, HLD, CHF (?, no ECHO), CKD, GERD, obesity, depression / anxiety, DVT 2011 s/p Rx and multiple myeloma (most recently on Kyprolis, s/p 14 cycles, last in 06/2013 now on chemo break) who was admitted 3/16 with 3 wk hx of weakness, decreased appetite & difficulty swallowing.  Patient reported on admission that she has been having difficulty Patient was found to have systolic BP's in 93'J, intermittent confusion & altered LOC in ER.  However, it is noted that she refused CTA Chest in ER.  UA noted to have 3-6 wbc's, few bacteria but patient asymptomatic on admission. Strep pneumo antigen positive with concern for L retrocardiac infiltrate on CXR.  She was admitted per [facility] to SDU.  She unfortunately progressed to hypercarbic respiratory failure in the early am of 3/17 requiring  intubation.  PCCM consulted for respiratory failure.     PAST MEDICAL HISTORY :  Past Medical History  Diagnosis Date  . Small bowel obstruction   . Diabetes mellitus   . Hypertension   . Hyperlipidemia   . Chronic renal insufficiency   . Multiple myeloma   . Abdominal pain   . Ventral hernia   . Congestive heart failure   . GERD (gastroesophageal reflux disease)   . Renal insufficiency   . Obesity   . Depression   . Anxiety   . Abscess     groin   Past Surgical History  Procedure Laterality Date  . Hernia repair      ventral hernia  . Exploratory laparotomy    . Oophorectomy    . Breast surgery      reduction  . Panniculectomy    . Incise and drain abcess      groin   Prior to Admission medications   Medication Sig Start Date End Date Taking? Authorizing Provider  Alum & Mag Hydroxide-Simeth (MAGIC MOUTHWASH W/LIDOCAINE) SOLN Take 5 mLs by mouth 4 (four) times daily. 08/06/13  Yes Wyatt Portela, MD  amLODipine (NORVASC) 5 MG tablet Take 1 tablet (5 mg total) by mouth daily. 09/22/12  Yes Theodis Blaze, MD  atenolol (TENORMIN) 25 MG tablet Take 12.5 mg by mouth daily. 09/22/12  Yes Theodis Blaze, MD  buPROPion (WELLBUTRIN XL) 150 MG 24 hr tablet Take 1 tablet (150 mg total) by mouth daily. 09/22/12  Yes Theodis Blaze, MD  diazepam (VALIUM) 5 MG tablet Take 1 tablet (5 mg total) by mouth every 8 (  eight) hours as needed for anxiety. 01/05/13  Yes Wyatt Portela, MD  furosemide (LASIX) 40 MG tablet Take 1 tablet (40 mg total) by mouth daily. 09/22/12  Yes Theodis Blaze, MD  lidocaine-prilocaine (EMLA) cream Apply topically as needed. 04/22/13 04/22/14 Yes Wyatt Portela, MD  loratadine (CLARITIN) 10 MG tablet Take 10 mg by mouth daily.     Yes Historical Provider, MD  Multiple Vitamin (MULTIVITAMIN) tablet Take 1 tablet by mouth daily.     Yes Historical Provider, MD  ondansetron (ZOFRAN) 8 MG tablet Take 1 tablet (8 mg total) by mouth every 8 (eight) hours as needed. 08/30/13  Yes  Wyatt Portela, MD  oxyCODONE (ROXICODONE) 5 MG/5ML solution Take 5-10 mLs (5-10 mg total) by mouth every 4 (four) hours as needed for severe pain (Take 5-10 mls every 4 hours as needed for pain.). 08/27/13  Yes Maryanna Shape, NP  temazepam (RESTORIL) 15 MG capsule Take 1 capsule (15 mg total) by mouth at bedtime as needed. 07/07/12  Yes Wyatt Portela, MD   Allergies  Allergen Reactions  . Ibuprofen Other (See Comments)    Patient does not remember.   . Morphine And Related Other (See Comments)    Patient does not remember reaction, believes it caused raised marks on skin.  . Other Rash    Tegaderm dressing    FAMILY HISTORY:  Family History  Problem Relation Age of Onset  . Heart disease Mother    SOCIAL HISTORY:  reports that she has quit smoking. She has never used smokeless tobacco. She reports that she does not drink alcohol or use illicit drugs.  REVIEW OF SYSTEMS:  Unable to complete as pt is altered / hypercarbic.   SUBJECTIVE:   VITAL SIGNS: Temp:  [97 F (36.1 C)-100.6 F (38.1 C)] 97 F (36.1 C) (03/17 0800) Pulse Rate:  [51-140] 102 (03/17 0830) Resp:  [0-34] 22 (03/17 0830) BP: (52-153)/(33-92) 52/42 mmHg (03/17 0830) SpO2:  [90 %-100 %] 100 % (03/17 0830) Weight:  [193 lb 9 oz (87.8 kg)] 193 lb 9 oz (87.8 kg) (03/17 0400) HEMODYNAMICS:   VENTILATOR SETTINGS:   INTAKE / OUTPUT: Intake/Output     03/16 0701 - 03/17 0700 03/17 0701 - 03/18 0700   I.V. (mL/kg) 600 (6.8)    IV Piggyback 600    Total Intake(mL/kg) 1200 (13.7)    Net +1200            PHYSICAL EXAMINATION: General:  Chronically ill adult female on vent Neuro:  Obtunded prior to intubation HEENT:  Mm pink/moist Cardiovascular:  s1s2 rrr, no m/r/g Lungs:  resp's even/non-labored, diminished on R with few crackles, L essentially clear Abdomen:  Round/soft, bsx4 active Musculoskeletal:  No acute deformities  Skin:  Warm/dry, no edema   LABS:  CBC  Recent Labs Lab 09/14/2013 1650  09/07/13 0336  WBC 5.8 8.3  HGB 13.9 13.0  HCT 41.5 40.2  PLT 88* 98*   Coag's  Recent Labs Lab 09/07/13 0904  INR 1.16   BMET  Recent Labs Lab 08/24/2013 1650 09/07/13 0336  NA 147 141  K 4.4 4.1  CL 107 114*  CO2 24 27  BUN 25* 25*  CREATININE 1.02 1.17*  GLUCOSE 91 166*   Electrolytes  Recent Labs Lab 08/23/2013 1650 09/07/13 0336  CALCIUM 10.9* 10.2  MG  --  2.3   Sepsis Markers  Recent Labs Lab 09/13/2013 1916  LATICACIDVEN 3.94*   ABG  Recent Labs Lab  09/07/13 0749  PHART 7.031*  PO2ART 93.6   Liver Enzymes  Recent Labs Lab 09/12/2013 1650  AST 41*  ALT 25  ALKPHOS 63  BILITOT 0.8  ALBUMIN 2.7*   Cardiac Enzymes  Recent Labs Lab 09/09/2013 2250  PROBNP 8205.0*   Glucose  Recent Labs Lab 09/04/2013 1534 09/19/2013 1811  GLUCAP 63* 120*    Imaging Dg Chest 2 View  09/11/2013   CLINICAL DATA:  Shortness of breath, history multiple myeloma, hypertension, diabetes, CHF, smoking  EXAM: CHEST  2 VIEW  COMPARISON:  09/05/2013 at 1555 hr  FINDINGS: Right jugular Port-A-Cath stable with tip projecting over SVC.  Borderline cardiac enlargement.  Mediastinal contours and pulmonary vascularity normal.  Lungs clear.  Atherosclerotic calcification aorta.  Retrocardiac left lower lobe atelectasis versus consolidation.  Lungs otherwise clear.  No pleural effusion or pneumothorax.  Osseous demineralization with multilevel endplate spur formation thoracic spine and dextro convex scoliosis.  IMPRESSION: Atelectasis versus consolidation in retrocardiac left lower lobe.   Electronically Signed   By: Lavonia Dana M.D.   On: 09/14/2013 17:12   Ct Head Wo Contrast  08/31/2013   CLINICAL DATA:  Weakness and fatigue.  Multiple myeloma.  EXAM: CT HEAD WITHOUT CONTRAST  TECHNIQUE: Contiguous axial images were obtained from the base of the skull through the vertex without intravenous contrast.  COMPARISON:  CT HEAD W/O CM dated 01/31/2011; CT ABD/PELV WO CM dated 02/02/2011;  DG HIP COMPLETE*L* dated 01/30/2012; MR EXTREM LOW*R* W/O CM dated 01/24/2007  FINDINGS: Empty or partially empty sella. The brainstem, cerebellum, cerebral peduncles, thalamus, basal ganglia, basilar cisterns, and ventricular system appear within normal limits. No intracranial hemorrhage, mass lesion, or acute CVA. Continued abnormal sclerosis along the left middle cranial fossa and temporal bone, with thickening and sclerosis as before. Generalized calvarial thickening with extensive heterogeneity and punctate lucencies similar to prior exam.  IMPRESSION: 1. No acute intracranial findings. 2. Similar appearance of empty or partially empty sella compared to prior exam from 01/31/2011. 3. Stable calvarial findings indicative of myelomatous involvement. Stable cortical thickening, sclerosis, and spiculated cortical margins in the squamosal portion of the left temporal bone, unchanged and possibly from Paget's disease or an unusual manifestation of myeloma.   Electronically Signed   By: Sherryl Barters M.D.   On: 09/03/2013 16:48   Dg Chest Port 1 View  09/07/2013   CLINICAL DATA:  Check endotracheal tube placement  EXAM: PORTABLE CHEST - 1 VIEW  COMPARISON:  09/08/2013  FINDINGS: Cardiac shadow is stable. A right-sided chest wall port is again seen and stable. A new left jugular central venous line is noted with the tip in the mid superior vena cava. No pneumothorax is noted. An endotracheal tube is seen with the tip 4.1 cm above the carina. Increased density is noted in the left lung base which has progressed in the interval from the prior exam. Significant rotation to the right accentuates the mediastinal markings.  IMPRESSION: New endotracheal tube placement and left jugular central line as described.  Increasing left basilar infiltrate.   Electronically Signed   By: Inez Catalina M.D.   On: 09/07/2013 09:41   Dg Chest Port 1 View  09/13/2013   CLINICAL DATA:  Weakness, shortness of breath  EXAM: PORTABLE CHEST  - 1 VIEW  COMPARISON:  DG CHEST 2 VIEW dated 01/31/2011; CT ABSCESS DRAINAGE dated 12/12/2010; CT ABD/PELV WO CM dated 12/21/2010; CT ABD/PELVIS W CM dated 03/27/2011  FINDINGS: There is mild bilateral interstitial prominence, likely chronic.  There is left retrocardiac hazy airspace disease which may reflect atelectasis versus developing pneumonia. There is no other focal parenchymal opacity. There is a trace left pleural effusion. The heart and mediastinal contours are unremarkable. There is a right-sided Port-A-Cath in satisfactory position.  The osseous structures are unremarkable.  IMPRESSION: 1. There is hazy left lower lobe retrocardiac airspace opacity which may reflect atelectasis versus developing pneumonia. Further evaluation with dedicated PA and lateral chest radiographs recommended.   Electronically Signed   By: Kathreen Devoid   On: 09/09/2013 16:14    ASSESSMENT / PLAN:  PULMONARY A: Acute Hypercarbic Respiratory Failure PNA - strep antigen positive P:   -Trend CXR -full vent support -f/u ABG -PRN albuterol only -See ID -SBT / WUA daily  -CTA chest pending to r/o PE, hx of DVT  CARDIOVASCULAR A:  HTN HLD ? CHF - pending ECHO, elevated BNP Hypotension -  P:  -await ECHO -hold home atenolol, amlodipine, lasix -levophed for MAP >65 -NS @ 75  RENAL A:   Chronic Renal Insufficiency Elevated Lactate  Hx Hypercalcemia P:   -trend BMP -repeat lactic acid now   GASTROINTESTINAL A:   GERD Obesity Dysphagia - new hx prior to admit, difficulty swallowing, hx of vomiting P:   -NPO -initiate TF protocol -will need swallow evaluation post extubation -PPI   HEMATOLOGIC A:   Multiple Myeloma Thrombocytopenia - chronic, runs ~ 130's-180's Hx DVT - 2011, s/p Rx with coumadin P:  -Dr. Alen Blew called for consultation -trend platelets  INFECTIOUS A:   PNA - LL infiltrate, + strep antigen Bacteriuria - asymptomatic on admission P:   -narrow abx to rocephin  -follow  cultures as above -PCT protocol  ENDOCRINE A:   Hyperglycemia  P:   -sensitive SSI -CBG's Q4  NEUROLOGIC A:   Metabolic Encephalopathy P:   -PRN fentanyl -hold gtt for now as hypercarbic, may need to adjust as patient wakes -monitor neuro assessment   [name], NP-C [facility] Pgr: [phone] or [phone]  Attending:  I have seen and examined the patient with nurse practitioner/resident and agree with the note above.   This morning we emergently intubated Ms. Wentland and placed her on vasopressors for respiratory failure and shock.  While she has what looks like left lower lobe pneumonia on CXR, this doesn't explain the three weeks of weakness at home.  Given the worsening infiltrate, pneumonia is the clear cause of this morning's acute decline.  I am most concerned about the possibility of recurrent or worsening myeloma, CHF or pulmonary hypertension at this point as an explanation for her subacute decline in the last three weeks.  Her hypercalcemia and thrombocytopenia seem out of proportion to her recent values making me concerned about the possibility of worsening myeloma.  It is unclear to me if she has underlying lung disease. She has never had PFTs.  For now, treat shock with vasopressors, fluids as needed.  Narrow antibiotics to ceftriaxone.    Sister updated by phone by me.  I have personally obtained a history, examined the patient, evaluated laboratory and imaging results, formulated the assessment and plan and placed orders.  CRITICAL CARE: The patient is critically ill with multiple organ systems failure and requires high complexity decision making for assessment and support, frequent evaluation and titration of therapies, application of advanced monitoring technologies and extensive interpretation of multiple databases. Critical Care Time devoted to patient care services described in this note is 60 minutes.   MCQUAID, DOUGLAS  [place]  PCCM Pager: [phone] Cell: [phone] If no response, call [phone]  09/07/2013, 9:51 AM

## 2013-09-07 NOTE — Progress Notes (Signed)
Echocardiogram 2D Echocardiogram has been performed.  [name] 09/07/2013, 10:54 AM

## 2013-09-07 NOTE — Therapy (Signed)
Pt intubated with Glide scope by Dr. Lake Bells, no complications, ETT secured at 24 cm at lip. CXR obtained. ETT 4.1 cm above carina. Pt currently on full vent support.

## 2013-09-08 ENCOUNTER — Other Ambulatory Visit: Payer: Medicaid Other

## 2013-09-08 ENCOUNTER — Other Ambulatory Visit: Payer: Self-pay

## 2013-09-08 ENCOUNTER — Inpatient Hospital Stay (HOSPITAL_COMMUNITY): Payer: Medicaid Other

## 2013-09-08 ENCOUNTER — Ambulatory Visit: Payer: Medicaid Other | Admitting: Oncology

## 2013-09-08 LAB — GLUCOSE, CAPILLARY
GLUCOSE-CAPILLARY: 144 mg/dL — AB (ref 70–99)
GLUCOSE-CAPILLARY: 145 mg/dL — AB (ref 70–99)
Glucose-Capillary: 166 mg/dL — ABNORMAL HIGH (ref 70–99)
Glucose-Capillary: 176 mg/dL — ABNORMAL HIGH (ref 70–99)
Glucose-Capillary: 139 mg/dL — ABNORMAL HIGH (ref 70–99)

## 2013-09-08 LAB — BASIC METABOLIC PANEL
BUN: 29 mg/dL — ABNORMAL HIGH (ref 6–23)
CALCIUM: 8.2 mg/dL — AB (ref 8.4–10.5)
CO2: 23 meq/L (ref 19–32)
Chloride: 115 mEq/L — ABNORMAL HIGH (ref 96–112)
Creatinine, Ser: 1.76 mg/dL — ABNORMAL HIGH (ref 0.50–1.10)
GFR calc Af Amer: 34 mL/min — ABNORMAL LOW (ref >90)
GFR calc non Af Amer: 30 mL/min — ABNORMAL LOW (ref >90)
GLUCOSE: 157 mg/dL — AB (ref 70–99)
POTASSIUM: 4.3 meq/L (ref 3.7–5.3)
SODIUM: 145 meq/L (ref 137–147)

## 2013-09-08 LAB — BLOOD GAS, ARTERIAL
Acid-base deficit: 4.4 mmol/L — ABNORMAL HIGH (ref 0.0–2.0)
Bicarbonate: 21.3 meq/L (ref 20.0–24.0)
Drawn by: 103701
Drawn by: 103701
FIO2: 0.4 %
MECHVT: 440 mL
O2 Content: 4 L/min
O2 Saturation: 93.1 %
O2 Saturation: 97.6 %
PEEP: 5 cmH2O
PO2 ART: 93.6 mmHg (ref 80.0–100.0)
Patient temperature: 98.6
Patient temperature: 98.6
RATE: 20 {breaths}/min
TCO2: 19.8 mmol/L (ref 0–100)
pCO2 arterial: 0 mmHg — CL (ref 35.0–45.0)
pCO2 arterial: 44.1 mmHg (ref 35.0–45.0)
pH, Arterial: 7.031 — CL (ref 7.350–7.450)
pH, Arterial: 7.305 — ABNORMAL LOW (ref 7.350–7.450)
pO2, Arterial: 106 mmHg — ABNORMAL HIGH (ref 80.0–100.0)

## 2013-09-08 LAB — COMPREHENSIVE METABOLIC PANEL WITH GFR
ALT: 20 U/L (ref 0–35)
AST: 38 U/L — ABNORMAL HIGH (ref 0–37)
Albumin: 1.7 g/dL — ABNORMAL LOW (ref 3.5–5.2)
Alkaline Phosphatase: 73 U/L (ref 39–117)
BUN: 29 mg/dL — ABNORMAL HIGH (ref 6–23)
CO2: 22 meq/L (ref 19–32)
Calcium: 8.4 mg/dL (ref 8.4–10.5)
Chloride: 112 meq/L (ref 96–112)
Creatinine, Ser: 1.62 mg/dL — ABNORMAL HIGH (ref 0.50–1.10)
GFR calc Af Amer: 38 mL/min — ABNORMAL LOW
GFR calc non Af Amer: 33 mL/min — ABNORMAL LOW
Glucose, Bld: 183 mg/dL — ABNORMAL HIGH (ref 70–99)
Potassium: 3.6 meq/L — ABNORMAL LOW (ref 3.7–5.3)
Sodium: 144 meq/L (ref 137–147)
Total Bilirubin: 0.4 mg/dL (ref 0.3–1.2)
Total Protein: 6.4 g/dL (ref 6.0–8.3)

## 2013-09-08 LAB — CBC
HEMATOCRIT: 34.6 % — AB (ref 36.0–46.0)
HEMOGLOBIN: 11.1 g/dL — AB (ref 12.0–15.0)
MCH: 28.3 pg (ref 26.0–34.0)
MCHC: 32.1 g/dL (ref 30.0–36.0)
MCV: 88.3 fL (ref 78.0–100.0)
Platelets: 72 10*3/uL — ABNORMAL LOW (ref 150–400)
RBC: 3.92 MIL/uL (ref 3.87–5.11)
RDW: 16.2 % — ABNORMAL HIGH (ref 11.5–15.5)
WBC: 7.5 10*3/uL (ref 4.0–10.5)

## 2013-09-08 LAB — PROCALCITONIN: Procalcitonin: 0.39 ng/mL

## 2013-09-08 MED ORDER — SODIUM CHLORIDE 0.9 % IV BOLUS (SEPSIS)
1000.0000 mL | Freq: Once | INTRAVENOUS | Status: AC
  Administered 2013-09-08: 1000 mL via INTRAVENOUS

## 2013-09-08 MED ORDER — SODIUM CHLORIDE 0.9 % IV BOLUS (SEPSIS)
500.0000 mL | Freq: Once | INTRAVENOUS | Status: AC
  Administered 2013-09-08: 500 mL via INTRAVENOUS

## 2013-09-08 MED ORDER — POTASSIUM CHLORIDE 20 MEQ/15ML (10%) PO LIQD
20.0000 meq | Freq: Once | ORAL | Status: AC
  Administered 2013-09-08: 20 meq
  Filled 2013-09-08: qty 15

## 2013-09-08 NOTE — Consult Note (Signed)
PULMONARY / CRITICAL CARE MEDICINE   Name: [name] MRN: [id number] DOB: 08/17/1950    ADMISSION DATE:  09/16/2013 CONSULTATION DATE:  09/07/13  REFERRING MD :  Dr. Maryland Pink PRIMARY SERVICE: [facility]-->PCCM  CHIEF COMPLAINT:  Acute Respiratory Failure  BRIEF PATIENT DESCRIPTION: 63 y/o F admitted with 3 wk hx of weakness, decreased appetite & difficulty swallowing.  Found to have positive strep antigen with concern for L retrocardiac infiltrate.  Progressed to hypercarbic respiratory failure, PCCM consulted.  SIGNIFICANT EVENTS / STUDIES:  3/16 - Admit with 3 wk hx of weakness, decreased appetite & difficulty swallowing.  Found to have positive strep antigen with concern for L retrocardiac infiltrate.  3/17 - Hypercarbic respiratory failure, intubated early am 3/17 - ECHO >> mod LVH, nml systolic fxn, EF 37-48%, nml wall motion, grade II diastolic dysfunction  2/70 - remains on vent, levophed & fentanyl gtt  LINES / TUBES: OETT 3/17>>> L IJ TLC 3/17>>> Port-A-Cath 3/17>>>  CULTURES: BCx2 3/16>>> RVP 3/16>>> Sputum 3/16>>> UC 3/16>>> UA 3/16>>>3-6 wbc, few bacteria.  Asymptomatic on admit. Strep Pneumo 3/16>>>positive  ANTIBIOTICS: Azithro 3/16>>>3/17 Cefepime 3/16>>>3/17 Rocephin 3/16>>>  SUBJECTIVE:  RN reports difficulty with sedation / BP balance.  Agitation if fentanyl gtt lifted, hypotension with fentanyl.   VITAL SIGNS: Temp:  [96.2 F (35.7 C)-99.1 F (37.3 C)] 97.9 F (36.6 C) (03/18 0800) Pulse Rate:  [89-122] 107 (03/18 0800) Resp:  [0-27] 20 (03/18 0800) BP: (60-153)/(18-126) 95/53 mmHg (03/18 0800) SpO2:  [93 %-100 %] 96 % (03/18 0800) FiO2 (%):  [40 %-50 %] 40 % (03/18 0401) Weight:  [206 lb 5.6 oz (93.6 kg)] 206 lb 5.6 oz (93.6 kg) (03/18 0400)  HEMODYNAMICS: CVP:  [6 mmHg-10 mmHg] 9 mmHg  VENTILATOR SETTINGS: Vent Mode:  [-] PRVC FiO2 (%):  [40 %-50 %] 40 % Set Rate:  [20 bmp] 20 bmp Vt Set:  [440 mL] 440 mL PEEP:  [5 cmH20] 5  cmH20 Plateau Pressure:  [23 cmH20-28 cmH20] 23 cmH20  INTAKE / OUTPUT: Intake/Output     03/17 0701 - 03/18 0700 03/18 0701 - 03/19 0700   I.V. (mL/kg) 4387.6 (46.9)    Other 875    NG/GT 530 50   IV Piggyback 50    Total Intake(mL/kg) 5842.6 (62.4) 50 (0.5)   Urine (mL/kg/hr) 367 (0.2)    Emesis/NG output 100 (0)    Total Output 467     Net +5375.6 +50          PHYSICAL EXAMINATION: General:  Chronically ill adult female on vent Neuro:  Arouses to voice, MAE, FC  HEENT:  Mm pink/moist, OETT Cardiovascular:  s1s2 rrr, no m/r/g Lungs:  resp's even/non-labored, coarse rhonchi bilaterally Abdomen:  Round/soft, bsx4 active Musculoskeletal:  No acute deformities  Skin:  Warm/dry, no edema   LABS:  CBC  Recent Labs Lab 09/04/2013 1650 09/07/13 0336 09/08/13 0425  WBC 5.8 8.3 7.5  HGB 13.9 13.0 11.1*  HCT 41.5 40.2 34.6*  PLT 88* 98* 72*   Coag's  Recent Labs Lab 09/07/13 0904  INR 1.16   BMET  Recent Labs Lab 09/02/2013 1650 09/07/13 0336 09/08/13 0425  NA 147 141 144  K 4.4 4.1 3.6*  CL 107 114* 112  CO2 _0 BUN 25* 25* 29*  CREATININE 1.02 1.17* 1.62*  GLUCOSE 91 166* 183*   Electrolytes  Recent Labs Lab 08/28/2013 1650 09/07/13 0336 09/08/13 0425  CALCIUM 10.9* 10.2 8.4  MG  --  2.3  --  Sepsis Markers  Recent Labs Lab 09/02/2013 1916 09/07/13 0336 09/07/13 0904 09/08/13 0425  LATICACIDVEN 3.94*  --  1.4  --   PROCALCITON  --  0.13  --  0.39   ABG  Recent Labs Lab 09/07/13 0749 09/07/13 1028  PHART 7.031* 7.266*  PCO2ART 0.0* 54.0*  PO2ART 93.6 296.0*   Liver Enzymes  Recent Labs Lab 09/07/2013 1650 09/08/13 0425  AST 41* 38*  ALT 25 20  ALKPHOS 63 73  BILITOT 0.8 0.4  ALBUMIN 2.7* 1.7*   Cardiac Enzymes  Recent Labs Lab 09/13/2013 2250  PROBNP 8205.0*   Glucose  Recent Labs Lab 09/07/13 1637 09/07/13 1956 09/07/13 2035 09/07/13 2319 09/08/13 0341 09/08/13 0721  GLUCAP 108* 114* 134* 166* 176* 144*     Imaging 3/18 CXR > bilateral airspace disease, ETT, CVL in place  ASSESSMENT / PLAN:  PULMONARY A: Acute Hypercarbic Respiratory Failure PNA - strep antigen positive, LLL Pulmonary Edema P:   -Trend CXR -full vent support -f/u ABG PRN -PRN albuterol only -See ID -SBT / WUA daily  -CTA chest pending to r/o PE, hx of DVT -consider diuresis when can tolerate from hemodynamic standpoint  CARDIOVASCULAR A:  HTN HLD CHF - EF 55-60%, grade II diastolic dysfunction, elevated BNP; but appears clinically hypovolemic 3/18 AM Hypotension - in setting of LLL PNA P:  -await ECHO -hold home atenolol, amlodipine, lasix -levophed for MAP >65 -reduce NS to 50  -assess cortisol -saline bolus now  RENAL A:   Acute on Chronic Renal Insufficiency Elevated Lactate  Hx Hypercalcemia P:   -trend BMP -20 kcl PT x1  -saline bolus x1 3/18   GASTROINTESTINAL A:   GERD Obesity Dysphagia - new hx prior to admit, difficulty swallowing, hx of vomiting P:   -NPO -initiate TF protocol -will need swallow evaluation post extubation -PPI   HEMATOLOGIC A:   Multiple Myeloma Thrombocytopenia - chronic, runs ~ 130's-180's Hx DVT - 2011, s/p Rx with coumadin P:  -Dr. Alen Howe called for consultation, appreciate input -trend platelets / monitor for bleeding  INFECTIOUS A:   PNA - LL infiltrate, + strep antigen Bacteriuria - asymptomatic on admission P:   -abx as above -follow cultures as above -PCT protocol  ENDOCRINE A:   Hyperglycemia  P:   -sensitive SSI -CBG's Q4  NEUROLOGIC A:   Metabolic Encephalopathy P:   -Fentanyl gtt -monitor neuro assessment   Sara Gens, NP-C [facility] Pgr: [phone] or [phone]  Attending:   I have seen and examined the patient with nurse practitioner/resident and agree with the note above.    I have personally obtained a history, examined the patient, evaluated laboratory and imaging results, formulated  the assessment and plan and placed orders.  CRITICAL CARE: The patient is critically ill with multiple organ systems failure and requires high complexity decision making for assessment and support, frequent evaluation and titration of therapies, application of advanced monitoring technologies and extensive interpretation of multiple databases. Critical Care Time devoted to patient care services described in this note is 45 minutes.   [name] PCCM Pager: [phone] Cell: [phone] If no response, call [phone]   09/08/2013, 9:44 AM

## 2013-09-08 NOTE — Progress Notes (Signed)
Goals of Care:  Extensive discussion with sister and niece at bedside.  Family updated on patients current clinical status (multi-system organ failure) as well as complicating underlying medical conditions.  Her sister is a [employer] scientist.  Family indicates they understand the ramifications of CPR in this clinical scenario and want her to be a full code with all aggressive measures needed. Informed family that we will continue to provide updates and ongoing discussion regarding goals of care.     Time spent with family: 70 min  [name], NP-C [facility] Pgr: [phone] or [phone]

## 2013-09-08 NOTE — Progress Notes (Signed)
OT Cancellation Note  Patient Details Name: JAZARA SWINEY MRN: [id number] DOB: 15-Apr-1951   Cancelled Treatment:    Reason Eval/Treat Not Completed: Medical issues which prohibited therapy.  Pt remains on vent.  Please reorder OT when pt off vent and medically ready.  Thank you.  Kiwanna Spraker 09/08/2013, 7:33 AM Lesle Chris, OTR/L [phone] 09/08/2013

## 2013-09-08 NOTE — Progress Notes (Signed)
[name] PCCM Pager: [phone] Cell: [phone] If no response, call [phone]

## 2013-09-08 NOTE — Progress Notes (Signed)
Physical Therapy Discharge Patient Details Name: [name] MRN: [id number] DOB: 17-Jan-1951 Today's Date: 09/08/2013 Time:  -     Patient discharged from PT services secondary to medical decline - will need to re-order PT to resume therapy services.PT. Remains on ventilator.  .    Progress and discharge plan discussed with patient and/or caregiver:N/A  GP     Marcelino Freestone PT [phone]  09/08/2013, 7:01 AM

## 2013-09-09 ENCOUNTER — Inpatient Hospital Stay (HOSPITAL_COMMUNITY): Payer: Medicaid Other

## 2013-09-09 LAB — CBC
HCT: 32.4 % — ABNORMAL LOW (ref 36.0–46.0)
Hemoglobin: 10.7 g/dL — ABNORMAL LOW (ref 12.0–15.0)
MCH: 29.1 pg (ref 26.0–34.0)
MCHC: 33 g/dL (ref 30.0–36.0)
MCV: 88 fL (ref 78.0–100.0)
PLATELETS: 57 10*3/uL — AB (ref 150–400)
RBC: 3.68 MIL/uL — AB (ref 3.87–5.11)
RDW: 16.4 % — ABNORMAL HIGH (ref 11.5–15.5)
WBC: 7.1 10*3/uL (ref 4.0–10.5)

## 2013-09-09 LAB — GLUCOSE, CAPILLARY
Glucose-Capillary: 141 mg/dL — ABNORMAL HIGH (ref 70–99)
Glucose-Capillary: 118 mg/dL — ABNORMAL HIGH (ref 70–99)
Glucose-Capillary: 134 mg/dL — ABNORMAL HIGH (ref 70–99)
Glucose-Capillary: 119 mg/dL — ABNORMAL HIGH (ref 70–99)
Glucose-Capillary: 121 mg/dL — ABNORMAL HIGH (ref 70–99)
Glucose-Capillary: 138 mg/dL — ABNORMAL HIGH (ref 70–99)
Glucose-Capillary: 103 mg/dL — ABNORMAL HIGH (ref 70–99)

## 2013-09-09 LAB — BASIC METABOLIC PANEL
BUN: 30 mg/dL — AB (ref 6–23)
CO2: 22 mEq/L (ref 19–32)
Calcium: 8.2 mg/dL — ABNORMAL LOW (ref 8.4–10.5)
Chloride: 111 mEq/L (ref 96–112)
Creatinine, Ser: 1.99 mg/dL — ABNORMAL HIGH (ref 0.50–1.10)
GFR, EST AFRICAN AMERICAN: 30 mL/min — AB (ref >90)
GFR, EST NON AFRICAN AMERICAN: 26 mL/min — AB (ref >90)
Glucose, Bld: 154 mg/dL — ABNORMAL HIGH (ref 70–99)
POTASSIUM: 3.4 meq/L — AB (ref 3.7–5.3)
Sodium: 144 mEq/L (ref 137–147)

## 2013-09-09 LAB — CULTURE, RESPIRATORY

## 2013-09-09 LAB — CORTISOL: Cortisol, Plasma: 16.2 ug/dL

## 2013-09-09 LAB — CULTURE, RESPIRATORY W GRAM STAIN: Special Requests: NORMAL

## 2013-09-09 LAB — MAGNESIUM: Magnesium: 2 mg/dL (ref 1.5–2.5)

## 2013-09-09 LAB — PROCALCITONIN: PROCALCITONIN: 0.41 ng/mL

## 2013-09-09 MED ORDER — SODIUM CHLORIDE 0.9 % IV SOLN
INTRAVENOUS | Status: DC
  Administered 2013-09-09 (×2): 20 mL/h via INTRAVENOUS
  Administered 2013-09-11: 19:00:00 via INTRAVENOUS
  Administered 2013-09-13: 20 mL via INTRAVENOUS
  Administered 2013-09-13 – 2013-09-14 (×2): via INTRAVENOUS
  Administered 2013-09-15: 30 mL/h via INTRAVENOUS
  Administered 2013-09-20: 22:00:00 via INTRAVENOUS

## 2013-09-09 MED ORDER — POTASSIUM CHLORIDE 10 MEQ/50ML IV SOLN
10.0000 meq | INTRAVENOUS | Status: AC
  Administered 2013-09-09 (×3): 10 meq via INTRAVENOUS
  Filled 2013-09-09 (×3): qty 50

## 2013-09-09 NOTE — Progress Notes (Signed)
Unable to perform due to squeezing tight and head movement.

## 2013-09-09 NOTE — ED Provider Notes (Signed)
Medical screening examination/treatment/procedure(s) were performed by non-physician practitioner and as supervising physician I was immediately available for consultation/collaboration.   EKG Interpretation   Date/Time:  Monday September 06 2013 15:50:35 EDT Ventricular Rate:  125 PR Interval:  134 QRS Duration: 75 QT Interval:  441 QTC Calculation: 636 R Axis:   59 Text Interpretation:  Sinus tachycardia Low voltage, precordial leads  Prolonged QT interval ED PHYSICIAN INTERPRETATION AVAILABLE IN [facility] Confirmed by TEST, Record ([id number]) on 09/08/2013 7:21:35 AM        Orpah Greek, MD 09/09/13 [phone]

## 2013-09-09 NOTE — Progress Notes (Signed)
PULMONARY / CRITICAL CARE MEDICINE   Name: [name] MRN: [id number] DOB: 11/03/1950    ADMISSION DATE:  09/21/2013 CONSULTATION DATE:  09/07/13  REFERRING MD :  Dr. Maryland Pink PRIMARY SERVICE: TRH-->PCCM  CHIEF COMPLAINT:  Acute Respiratory Failure  BRIEF PATIENT DESCRIPTION: 63 y/o F admitted with 3 wk hx of weakness, decreased appetite & difficulty swallowing.  Found to have positive strep antigen with concern for L retrocardiac infiltrate.  Progressed to hypercarbic respiratory failure, PCCM consulted.  SIGNIFICANT EVENTS / STUDIES:  3/16 - Admit with 3 wk hx of weakness, decreased appetite & difficulty swallowing.  Found to have positive strep antigen with concern for L retrocardiac infiltrate.  3/17 - Hypercarbic respiratory failure, intubated early am 3/17 - ECHO >> mod LVH, nml systolic fxn, EF 52-84%, nml wall motion, grade II diastolic dysfunction  1/32 - remains on vent, levophed & fentanyl gtt  LINES / TUBES: OETT 3/17>>> L IJ TLC 3/17>>> Port-A-Cath 3/17>>>  CULTURES: BCx2 3/16>>> RVP 3/16>>> Sputum 3/16>>> UC 3/16>>> UA 3/16>>>3-6 wbc, few bacteria.  Asymptomatic on admit. Strep Pneumo 3/16>>>positive  ANTIBIOTICS: Azithro 3/16>>>3/17 Cefepime 3/16>>>3/17 Rocephin 3/16>>>  SUBJECTIVE:  Run of Vtach overnight, remains afebrile, UOP picking up  VITAL SIGNS: Temp:  [97 F (36.1 C)-98.8 F (37.1 C)] 97 F (36.1 C) (03/19 0400) Pulse Rate:  [72-126] 79 (03/19 0800) Resp:  [17-24] 20 (03/19 0834) BP: (74-142)/(36-81) 131/67 mmHg (03/19 0834) SpO2:  [94 %-100 %] 96 % (03/19 0800) FiO2 (%):  [40 %] 40 % (03/19 0400) Weight:  [99.1 kg (218 lb 7.6 oz)] 99.1 kg (218 lb 7.6 oz) (03/19 0400)  HEMODYNAMICS: CVP:  [11 mmHg-16 mmHg] 15 mmHg  VENTILATOR SETTINGS: Vent Mode:  [-] PRVC FiO2 (%):  [40 %] 40 % Set Rate:  [20 bmp] 20 bmp Vt Set:  [440 mL] 440 mL PEEP:  [5 cmH20] 5 cmH20 Plateau Pressure:  [22 cmH20-26 cmH20] 26 cmH20  INTAKE /  OUTPUT: Intake/Output     03/18 0701 - 03/19 0700 03/19 0701 - 03/20 0700   I.V. (mL/kg) 1412 (14.2)    Other     NG/GT 1761.3    IV Piggyback 1050    Total Intake(mL/kg) 4223.3 (42.6)    Urine (mL/kg/hr) 790 (0.3)    Emesis/NG output     Total Output 790     Net +3433.3            PHYSICAL EXAMINATION:  Gen: chronically ill appearing, awake on vent, nods head appropriately HEENT: NCAT, EOMi, ETT in place PULM: Crackles left base > right CV: RRR, systolic murmur noted AB: BS+, soft, nontender, no hsm Ext: warm, trace edema Derm: no rash or skin breakdown Neuro: follows commands, intermittently agitated   LABS:  CBC  Recent Labs Lab 09/07/13 0336 09/08/13 0425 09/09/13 0555  WBC 8.3 7.5 7.1  HGB 13.0 11.1* 10.7*  HCT 40.2 34.6* 32.4*  PLT 98* 72* 57*   Coag's  Recent Labs Lab 09/07/13 0904  INR 1.16   BMET  Recent Labs Lab 09/08/13 0425 09/08/13 1553 09/09/13 0555  NA 144 145 144  K 3.6* 4.3 3.4*  CL 112 115* 111  CO2 _0 BUN 29* 29* 30*  CREATININE 1.62* 1.76* 1.99*  GLUCOSE 183* 157* 154*   Electrolytes  Recent Labs Lab 09/07/13 0336 09/08/13 0425 09/08/13 1553 09/09/13 0555  CALCIUM 10.2 8.4 8.2* 8.2*  MG 2.3  --   --  2.0   Sepsis Markers  Recent Labs Lab  09/04/2013 1916 09/07/13 0336 09/07/13 0904 09/08/13 0425 09/09/13 0555  LATICACIDVEN 3.94*  --  1.4  --   --   PROCALCITON  --  0.13  --  0.39 0.41   ABG  Recent Labs Lab 09/07/13 0749 09/07/13 1028 09/08/13 1105  PHART 7.031* 7.266* 7.305*  PCO2ART 0.0* 54.0* 44.1  PO2ART 93.6 296.0* 106.0*   Liver Enzymes  Recent Labs Lab 08/27/2013 1650 09/08/13 0425  AST 41* 38*  ALT 25 20  ALKPHOS 63 73  BILITOT 0.8 0.4  ALBUMIN 2.7* 1.7*   Cardiac Enzymes  Recent Labs Lab 09/21/2013 2250  PROBNP 8205.0*   Glucose  Recent Labs Lab 09/08/13 1126 09/08/13 1612 09/08/13 2038 09/08/13 2320 09/09/13 0351 09/09/13 0824  GLUCAP 145* 139* 141* 118* 134*  119*    Imaging 3/18 CXR > bilateral airspace disease, ETT, CVL in place  ASSESSMENT / PLAN:  PULMONARY A: Acute Hypercarbic Respiratory Failure PNA - strep antigen positive, LLL Pulmonary Edema > larger issue 3/19 P:   -continue full vent support -SBT as tolerated daily -pulm toilette -diurese when able  CARDIOVASCULAR A:  HLD CHF - EF 55-60%, grade II diastolic dysfunction, elevated BNP; some pulm edema 3/19 AM Shock, septic - 3/19 hypotension seems sedation related; all other sepsis markers improved or stable P:  -hold home atenolol, amlodipine, lasix -levophed for MAP >65 -kvo -if unable to wean levophed 3/19, then place a-line -f/u cortisol -diurese when able  RENAL A:   Acute on Chronic Renal Insufficiency > Cr rising but UOP is up 3/19 Elevated Lactate > resolved Hx Hypercalcemia due to Mult myeloma P:   -trend BMP -30 kcl PT x1  -kvo fluids   GASTROINTESTINAL A:   GERD Obesity Dysphagia - new hx prior to admit, difficulty swallowing, hx of vomiting P:   -continue TF -will need swallow evaluation post extubation -PPI   HEMATOLOGIC A:   Multiple Myeloma Thrombocytopenia - chronic, runs ~ 130's-180's Hx DVT - 2011, s/p Rx with coumadin P:  -Dr. Alen Blew called for consultation, appreciate input -trend platelets / monitor for bleeding -SCD's  INFECTIOUS A:   PNA - LL infiltrate, + strep antigen; sepsis markers stable (procalcitonin, WBC, fever) Bacteriuria - asymptomatic on admission P:   -abx as above -follow cultures as above  ENDOCRINE A:   Hyperglycemia  P:   -sensitive SSI -CBG's Q4  NEUROLOGIC A:   Metabolic Encephalopathy P:   -Fentanyl gtt -monitor neuro assessment   Code status: Lengthy conversation with [name] her sister on 3/18 in person and 3/19 via phone.  She wishes her sister to be full code.  I explained to them that with multi-organ failure the likelihood of surviving cardiac arrest is low.   I have personally  obtained a history, examined the patient, evaluated laboratory and imaging results, formulated the assessment and plan and placed orders.  CRITICAL CARE: The patient is critically ill with multiple organ systems failure and requires high complexity decision making for assessment and support, frequent evaluation and titration of therapies, application of advanced monitoring technologies and extensive interpretation of multiple databases. Critical Care Time devoted to patient care services described in this note is 35 minutes.   [name] PCCM Pager: [phone] Cell: [phone] If no response, call [phone]   09/09/2013, 8:37 AM

## 2013-09-09 NOTE — Progress Notes (Signed)
Pt had two 6 beat runs of vtach and then shortly after went into afib.  Her heart beat increased to the 170s and then converted back into NSR. EKG showed NSR with PVCs and PACs.  Notified elink MD and no new orders. Will continue to monitor pt.

## 2013-09-10 ENCOUNTER — Inpatient Hospital Stay (HOSPITAL_COMMUNITY): Payer: Medicaid Other

## 2013-09-10 ENCOUNTER — Other Ambulatory Visit: Payer: Medicaid Other

## 2013-09-10 ENCOUNTER — Ambulatory Visit: Payer: Medicaid Other | Admitting: Oncology

## 2013-09-10 LAB — GLUCOSE, CAPILLARY
GLUCOSE-CAPILLARY: 131 mg/dL — AB (ref 70–99)
GLUCOSE-CAPILLARY: 101 mg/dL — AB (ref 70–99)
Glucose-Capillary: 89 mg/dL (ref 70–99)
Glucose-Capillary: 101 mg/dL — ABNORMAL HIGH (ref 70–99)
Glucose-Capillary: 129 mg/dL — ABNORMAL HIGH (ref 70–99)
Glucose-Capillary: 117 mg/dL — ABNORMAL HIGH (ref 70–99)
Glucose-Capillary: 111 mg/dL — ABNORMAL HIGH (ref 70–99)

## 2013-09-10 LAB — CBC WITH DIFFERENTIAL/PLATELET
Basophils Absolute: 0.1 10*3/uL (ref 0.0–0.1)
Basophils Relative: 1 % (ref 0–1)
EOS ABS: 0 10*3/uL (ref 0.0–0.7)
Eosinophils Relative: 0 % (ref 0–5)
HCT: 32.8 % — ABNORMAL LOW (ref 36.0–46.0)
Hemoglobin: 10.8 g/dL — ABNORMAL LOW (ref 12.0–15.0)
Lymphocytes Relative: 14 % (ref 12–46)
Lymphs Abs: 0.9 10*3/uL (ref 0.7–4.0)
MCH: 28.5 pg (ref 26.0–34.0)
MCHC: 32.9 g/dL (ref 30.0–36.0)
MCV: 86.5 fL (ref 78.0–100.0)
Monocytes Absolute: 0.2 10*3/uL (ref 0.1–1.0)
Monocytes Relative: 3 % (ref 3–12)
NEUTROS PCT: 82 % — AB (ref 43–77)
Neutro Abs: 5.5 10*3/uL (ref 1.7–7.7)
Platelets: 47 10*3/uL — ABNORMAL LOW (ref 150–400)
RBC: 3.79 MIL/uL — ABNORMAL LOW (ref 3.87–5.11)
RDW: 16.8 % — ABNORMAL HIGH (ref 11.5–15.5)
WBC: 6.7 10*3/uL (ref 4.0–10.5)

## 2013-09-10 LAB — BLOOD GAS, ARTERIAL
ACID-BASE DEFICIT: 4.5 mmol/L — AB (ref 0.0–2.0)
Bicarbonate: 21.7 mEq/L (ref 20.0–24.0)
Drawn by: 308601
FIO2: 0.3 %
LHR: 20 {breaths}/min
O2 SAT: 96.1 %
PEEP: 5 cmH2O
Patient temperature: 37
TCO2: 20.3 mmol/L (ref 0–100)
VT: 440 mL
pCO2 arterial: 47.5 mmHg — ABNORMAL HIGH (ref 35.0–45.0)
pH, Arterial: 7.282 — ABNORMAL LOW (ref 7.350–7.450)
pO2, Arterial: 83.9 mmHg (ref 80.0–100.0)

## 2013-09-10 LAB — BASIC METABOLIC PANEL
BUN: 30 mg/dL — ABNORMAL HIGH (ref 6–23)
CALCIUM: 7.8 mg/dL — AB (ref 8.4–10.5)
CO2: 22 mEq/L (ref 19–32)
CREATININE: 2.19 mg/dL — AB (ref 0.50–1.10)
Chloride: 112 mEq/L (ref 96–112)
GFR calc Af Amer: 26 mL/min — ABNORMAL LOW (ref >90)
GFR, EST NON AFRICAN AMERICAN: 23 mL/min — AB (ref >90)
GLUCOSE: 105 mg/dL — AB (ref 70–99)
Potassium: 3.9 mEq/L (ref 3.7–5.3)
Sodium: 145 mEq/L (ref 137–147)

## 2013-09-10 MED ORDER — PHENYLEPHRINE HCL 10 MG/ML IJ SOLN
30.0000 ug/min | INTRAVENOUS | Status: DC
  Administered 2013-09-10: 80 ug/min via INTRAVENOUS
  Administered 2013-09-10 (×2): 100 ug/min via INTRAVENOUS
  Administered 2013-09-11: 55 ug/min via INTRAVENOUS
  Administered 2013-09-11: 75 ug/min via INTRAVENOUS
  Administered 2013-09-12: 40 ug/min via INTRAVENOUS
  Administered 2013-09-12: 50 ug/min via INTRAVENOUS
  Administered 2013-09-14: 55 ug/min via INTRAVENOUS
  Administered 2013-09-15: 30 ug/min via INTRAVENOUS
  Administered 2013-09-15: 20 ug/min via INTRAVENOUS
  Administered 2013-09-15: 27 ug/min via INTRAVENOUS
  Administered 2013-09-16: 100 ug/min via INTRAVENOUS
  Administered 2013-09-16: 40 ug/min via INTRAVENOUS
  Administered 2013-09-17: 75 ug/min via INTRAVENOUS
  Administered 2013-09-17: 60 ug/min via INTRAVENOUS
  Administered 2013-09-17: 50 ug/min via INTRAVENOUS
  Administered 2013-09-17: 55 ug/min via INTRAVENOUS
  Administered 2013-09-18: 50 ug/min via INTRAVENOUS
  Administered 2013-09-19: 150 ug/min via INTRAVENOUS
  Administered 2013-09-19: 55 ug/min via INTRAVENOUS
  Administered 2013-09-19: 50 ug/min via INTRAVENOUS
  Administered 2013-09-20 (×2): 200 ug/min via INTRAVENOUS
  Administered 2013-09-20: 170 ug/min via INTRAVENOUS
  Administered 2013-09-21 – 2013-09-22 (×10): 200 ug/min via INTRAVENOUS
  Filled 2013-09-10 (×36): qty 4

## 2013-09-10 MED ORDER — DEXMEDETOMIDINE HCL IN NACL 200 MCG/50ML IV SOLN
0.2000 ug/kg/h | INTRAVENOUS | Status: AC
  Administered 2013-09-10: 0.2 ug/kg/h via INTRAVENOUS
  Administered 2013-09-10: 0.5 ug/kg/h via INTRAVENOUS
  Administered 2013-09-11: 0.3 ug/kg/h via INTRAVENOUS
  Administered 2013-09-11: 0.4 ug/kg/h via INTRAVENOUS
  Administered 2013-09-11 (×2): 0.5 ug/kg/h via INTRAVENOUS
  Filled 2013-09-10 (×6): qty 50

## 2013-09-10 MED ORDER — PHENYLEPHRINE HCL 10 MG/ML IJ SOLN
30.0000 ug/min | INTRAVENOUS | Status: DC
  Administered 2013-09-10: 75 ug/min via INTRAVENOUS
  Administered 2013-09-10: 30 ug/min via INTRAVENOUS
  Filled 2013-09-10 (×2): qty 1

## 2013-09-10 MED ORDER — PRO-STAT SUGAR FREE PO LIQD
30.0000 mL | Freq: Two times a day (BID) | ORAL | Status: DC
  Administered 2013-09-10 – 2013-09-15 (×10): 30 mL
  Filled 2013-09-10 (×11): qty 30

## 2013-09-10 MED ORDER — FENTANYL CITRATE 0.05 MG/ML IJ SOLN
25.0000 ug | INTRAMUSCULAR | Status: DC | PRN
  Administered 2013-09-12: 25 ug via INTRAVENOUS
  Administered 2013-09-13 (×2): 12.5 ug via INTRAVENOUS
  Filled 2013-09-10 (×3): qty 2

## 2013-09-10 NOTE — Clinical Documentation Improvement (Signed)
PCCM MD's, NP's and PA's  Nutritional evaluation on 09/07/13 noted patient to meet criteria for malnutrition due to 12 % weight loss in < 2 mos, and less than  75 % intake for > 1 month , BMI 33.21.  Please document clinical condition if appropriate.  Thank you    Possible Clinical Conditions?  Severe Malnutrition    Protein Calorie Malnutrition  Severe Protein Calorie Malnutrition  Other Condition  Cannot clinically determine    Risk Factors: Sepsis, Pneumonia due to streptococcus, acute kidney failure, PVT, diastolic heart failure, mx myeloma     Treatment: Enteral Nutrition being used   Thank You, Ree Kida ,RN Clinical Documentation Specialist:  [employer]

## 2013-09-10 NOTE — Progress Notes (Signed)
NUTRITION FOLLOW UP  Intervention:   - Keep TF of Vital High Protein via OGT at 36ml/hr and add Prostat 28ml BID. This will provide 1160 calories and 114g protein and meet 86% estimated calorie needs (21 kcal/kg ideal body weight) and 100% estimated protein needs - Will continue to monitor   Nutrition Dx:   Inadequate oral intake related to inability to eat as evidenced by NPO status - ongoing    Goal:   Enteral nutrition to provide 60-70% of estimated calorie needs (22-25 kcals/kg ideal body weight) and 100% of estimated protein needs, based on ASPEN guidelines for permissive underfeeding in critically ill obese individuals - not met but will meet with addition of Prostat 31ml BID   Monitor:   Weights, labs, TF tolerance, vent status   Assessment:   Patient with a 3 week hx of weakness, decreased appetite and difficulty swallowing. Found to have positive strep antigen with concern for L retrocardiac infiltrate. Requiring intubation today due to hypercarbic respiratory failure. PNA and metabolic encephalopathy. Hx of light chain multiple myeloma and received Kyprolis receiving last therapy on 06/2013 and Zometa monthly.   Per weight hx, patient with a 22% weight loss in the past 9 months.  3/17 - RD received TF management consul and advanced TF of Vital High Protein at 56ml/hr via OGT by 68ml every 4 hours to goal of 14ml/hr to provide 1200 kcal and 105 gm protein daily  3/20 - Pt discussed during multidisciplinary rounds. RN reports pt had elevated TF residuals of 200-3100ml last night but only 81ml residuals twice today. No plans for extubation. Weight up 24 pounds since admission.   Patient is currently intubated on ventilator support.  MV: 4.3 L/min Temp (24hrs), Avg:98.2 F (36.8 C), Min:97.4 F (36.3 C), Max:98.8 F (37.1 C)  Propofol: off   TF: Vital High Protein at 28ml/hr which provides 960 calories and 84g protein which meets 71% estimated calorie needs and 77% estimated  protein needs  AST elevated but trending down  Height: Ht Readings from Last 1 Encounters:  09/07/13 $RemoveB'5\' 4"'[id number]$  (1.626 m)    Weight Status:   Wt Readings from Last 1 Encounters:  09/10/13 217 lb 13 oz (98.8 kg)  Admit wt         193 lb 9 oz (87.8 kg) Net I/Os: +12.7L  Re-estimated needs:  Kcal: 1354 Protein: 109-120g Fluid: >1.3L/day  Skin: +2 generalized edema, +1 RUE, LUE edema, +2 RLE, LLE edema  Diet Order: NPO   Intake/Output Summary (Last 24 hours) at 09/10/13 1526 Last data filed at 09/10/13 1500  Gross per 24 hour  Intake 2855.53 ml  Output    860 ml  Net 1995.53 ml    Last BM: PTA   Labs:   Recent Labs Lab 09/07/13 0336  09/08/13 1553 09/09/13 0555 09/10/13 0603  NA 141  < > 145 144 145  K 4.1  < > 4.3 3.4* 3.9  CL 114*  < > 115* 111 112  CO2 27  < > $R'23 22 22  'Ar$ BUN 25*  < > 29* 30* 30*  CREATININE 1.17*  < > 1.76* 1.99* 2.19*  CALCIUM 10.2  < > 8.2* 8.2* 7.8*  MG 2.3  --   --  2.0  --   GLUCOSE 166*  < > 157* 154* 105*  < > = values in this interval not displayed.  CBG (last 3)   Recent Labs  09/10/13 0322 09/10/13 0814 09/10/13 1058  GLUCAP 101* 129* 117*  Scheduled Meds: . antiseptic oral rinse  15 mL Mouth Rinse QID  . cefTRIAXone (ROCEPHIN)  IV  2 g Intravenous Q24H  . chlorhexidine  15 mL Mouth Rinse BID  . feeding supplement (VITAL HIGH PROTEIN)  1,000 mL Per Tube Q24H  . insulin aspart  0-9 Units Subcutaneous 6 times per day  . magic mouthwash w/lidocaine  5 mL Oral QID  . pantoprazole (PROTONIX) IV  40 mg Intravenous Daily    Continuous Infusions: . sodium chloride 20 mL/hr (09/09/13 1855)  . fentaNYL infusion INTRAVENOUS 100 mcg/hr (09/10/13 1500)  . phenylephrine (NEO-SYNEPHRINE) Adult infusion 80 mcg/min (09/10/13 1500)     Mikey College MS, RD, LDN [phone] Pager [phone] After Hours Pager

## 2013-09-10 NOTE — Progress Notes (Signed)
[facility] Progress Note Patient Name: [name] DOB: [birth year] MRN: [id number]  Date of Service  09/10/2013   HPI/Events of Note   AF/RVR Hypotension   eICU Interventions   D/c Levophed Start Neo-Synephrine    Intervention Category Major Interventions: Arrhythmia - evaluation and management;Hypotension - evaluation and management  Sheyla Zaffino 09/10/2013, 2:34 AM

## 2013-09-10 NOTE — Progress Notes (Signed)
PULMONARY / CRITICAL CARE MEDICINE   Name: [name] MRN: [id number] DOB: December 07, 1950    ADMISSION DATE:  08/27/2013 CONSULTATION DATE:  09/07/13  REFERRING MD :  Dr. Maryland Pink PRIMARY SERVICE: TRH-->PCCM  CHIEF COMPLAINT:  Acute Respiratory Failure  BRIEF PATIENT DESCRIPTION: 63 y/o F admitted with 3 wk hx of weakness, decreased appetite & difficulty swallowing.  Found to have positive strep antigen with concern for L retrocardiac infiltrate.  Progressed to hypercarbic respiratory failure, PCCM consulted.  SIGNIFICANT EVENTS / STUDIES:  3/16 - Admit with 3 wk hx of weakness, decreased appetite & difficulty swallowing.  Found to have positive strep antigen with concern for L retrocardiac infiltrate.  3/17 - Hypercarbic respiratory failure, intubated early am 3/17 - ECHO >> mod LVH, nml systolic fxn, EF 83-41%, nml wall motion, grade II diastolic dysfunction  9/62 - remains on vent, levophed & fentanyl gtt 3/20 - afib with RVR overnight  LINES / TUBES: OETT 3/17>>> L IJ TLC 3/17>>> Port-A-Cath 3/17>>>  CULTURES: BCx2 3/16>>> RVP 3/16>>>neg Sputum 3/16>>>nml flora UC 3/16>>>neg UA 3/16>>>3-6 wbc, few bacteria.  Asymptomatic on admit. Strep Pneumo 3/16>>>positive  ANTIBIOTICS: Azithro 3/16>>>3/17 Cefepime 3/16>>>3/17 Rocephin 3/16>>>  SUBJECTIVE:  Afib with RVR overnight.  Initial SBT trial done while on full dose fentanyl.    VITAL SIGNS: Temp:  [97.4 F (36.3 C)-98.8 F (37.1 C)] 97.4 F (36.3 C) (03/20 0700) Pulse Rate:  [44-136] 106 (03/20 0914) Resp:  [0-25] 20 (03/20 0914) BP: (60-129)/(33-99) 87/44 mmHg (03/20 0700) SpO2:  [85 %-100 %] 100 % (03/20 0914) FiO2 (%):  [30 %-40 %] 30 % (03/20 0914) Weight:  [217 lb 13 oz (98.8 kg)] 217 lb 13 oz (98.8 kg) (03/20 0600)  HEMODYNAMICS: CVP:  [12 mmHg-28 mmHg] 20 mmHg  VENTILATOR SETTINGS: Vent Mode:  [-] PRVC FiO2 (%):  [30 %-40 %] 30 % Set Rate:  [20 bmp] 20 bmp Vt Set:  [440 mL] 440 mL PEEP:  [5  cmH20] 5 cmH20 Plateau Pressure:  [22 cmH20-29 cmH20] 29 cmH20  INTAKE / OUTPUT: Intake/Output     03/19 0701 - 03/20 0700 03/20 0701 - 03/21 0700   I.V. (mL/kg) 1540.1 (15.6)    Other 400    NG/GT 882.5    IV Piggyback 350    Total Intake(mL/kg) 3172.6 (32.1)    Urine (mL/kg/hr) 1130 (0.5)    Total Output 1130     Net +2042.6            PHYSICAL EXAMINATION: Gen: chronically ill appearing, awake on vent, nods head appropriately HEENT: NCAT, EOMi, ETT in place PULM: resp's even/non-labored on vent, basilar crackles L >R  CV: RRR, systolic murmur noted AB: BS+, soft, nontender, no hsm Ext: warm, trace edema Derm: no rash or skin breakdown Neuro: follows commands, intermittently agitated   LABS:  CBC  Recent Labs Lab 09/08/13 0425 09/09/13 0555 09/10/13 0603  WBC 7.5 7.1 6.7  HGB 11.1* 10.7* 10.8*  HCT 34.6* 32.4* 32.8*  PLT 72* 57* 47*   Coag's  Recent Labs Lab 09/07/13 0904  INR 1.16   BMET  Recent Labs Lab 09/08/13 1553 09/09/13 0555 09/10/13 0603  NA 145 144 145  K 4.3 3.4* 3.9  CL 115* 111 112  CO2 _0 BUN 29* 30* 30*  CREATININE 1.76* 1.99* 2.19*  GLUCOSE 157* 154* 105*   Electrolytes  Recent Labs Lab 09/07/13 0336  09/08/13 1553 09/09/13 0555 09/10/13 0603  CALCIUM 10.2  < > 8.2* 8.2* 7.8*  MG 2.3  --   --  2.0  --   < > = values in this interval not displayed. Sepsis Markers  Recent Labs Lab 09/05/2013 1916 09/07/13 0336 09/07/13 0904 09/08/13 0425 09/09/13 0555  LATICACIDVEN 3.94*  --  1.4  --   --   PROCALCITON  --  0.13  --  0.39 0.41   ABG  Recent Labs Lab 09/07/13 1028 09/08/13 1105 09/10/13 0408  PHART 7.266* 7.305* 7.282*  PCO2ART 54.0* 44.1 47.5*  PO2ART 296.0* 106.0* 83.9   Liver Enzymes  Recent Labs Lab 09/03/2013 1650 09/08/13 0425  AST 41* 38*  ALT 25 20  ALKPHOS 63 73  BILITOT 0.8 0.4  ALBUMIN 2.7* 1.7*   Cardiac Enzymes  Recent Labs Lab 09/11/2013 2250  PROBNP 8205.0*    Glucose  Recent Labs Lab 09/09/13 1147 09/09/13 1620 09/09/13 1952 09/09/13 2313 09/10/13 0322 09/10/13 0814  GLUCAP 121* 138* 103* 89 101* 129*    Imaging 3/18 CXR > bilateral airspace disease, ETT, CVL in place  ASSESSMENT / PLAN:  PULMONARY A: Acute Hypercarbic Respiratory Failure PNA - strep antigen positive, LLL Pulmonary Edema > larger issue 3/19, 10L + positive 3/20 P:   -continue full vent support -SBT / WUA daily, re-trial afternoon of 3/20 -pulm toilette -diurese when able / BP will tolerate  CARDIOVASCULAR A:  HLD CHF - EF 55-60%, grade II diastolic dysfunction, elevated BNP; some pulm edema 3/19 AM Shock, septic - 3/19 hypotension seems sedation related; all other sepsis markers improved or stable A-Fib - new onset 3/20 pm with RVR, resolved 3/20 am P:  -hold home atenolol, amlodipine, lasix -transitioned from levo to neo 3/20 in setting of afib w RVR, wean for MAP >65 -kvo IVF -cortisol wnl -diurese when able  RENAL A:   Acute on Chronic Renal Insufficiency - Cr rising but UOP is up 3/19 Elevated Lactate - resolved Hx Hypercalcemia due to Mult myeloma P:   -trend BMP -kvo fluids   GASTROINTESTINAL A:   GERD Obesity Dysphagia - new hx prior to admit, difficulty swallowing, hx of vomiting P:   -continue TF -will need swallow evaluation post extubation -PPI   HEMATOLOGIC A:   Multiple Myeloma Thrombocytopenia - chronic, runs ~ 130's-180's Hx DVT - 2011, s/p Rx with coumadin P:  -Dr. Alen Blew called for consultation, appreciate input -trend platelets / monitor for bleeding -SCD's for DVT prophylaxis in setting of thrombocytopenia  INFECTIOUS A:   PNA - LL infiltrate, + strep antigen; sepsis markers stable (procalcitonin, WBC, fever) Bacteriuria - asymptomatic on admission P:   -abx as above -follow cultures as above  ENDOCRINE A:   Hyperglycemia  P:   -sensitive SSI -CBG's Q4  NEUROLOGIC A:   Metabolic  Encephalopathy P:   -Fentanyl gtt -monitor neuro assessment   Code status: Lengthy conversation with [name] her sister on 3/18 in person and 3/19 via phone.  She wishes her sister to be full code.  I explained to them that with multi-organ failure the likelihood of surviving cardiac arrest is low.   Attending:  I have seen and examined the patient with nurse practitioner/resident and agree with the note above.   Nearing extubation Shock at this point seems mostly sedation related If no extubation today, change to precedex Wean phenylephrine  I have personally obtained a history, examined the patient, evaluated laboratory and imaging results, formulated the assessment and plan and placed orders.  CRITICAL CARE: The patient is critically ill with multiple organ systems failure  and requires high complexity decision making for assessment and support, frequent evaluation and titration of therapies, application of advanced monitoring technologies and extensive interpretation of multiple databases. Critical Care Time devoted to patient care services described in this note is 40 minutes.   [name] PCCM Pager: [phone] Cell: [phone] If no response, call [phone]  09/10/2013, 10:56 AM

## 2013-09-10 NOTE — Progress Notes (Signed)
[id number] Rosana Hoes, RN, BSN, CCM [phone] Chart Reviewed for discharge and hospital needs. Discharge needs at time of review:  None present will follow for needs. Review of patient progress due on [id number].

## 2013-09-11 ENCOUNTER — Inpatient Hospital Stay (HOSPITAL_COMMUNITY): Payer: Medicaid Other

## 2013-09-11 LAB — CBC
HCT: 34.5 % — ABNORMAL LOW (ref 36.0–46.0)
Hemoglobin: 11.5 g/dL — ABNORMAL LOW (ref 12.0–15.0)
MCH: 28.9 pg (ref 26.0–34.0)
MCHC: 33.3 g/dL (ref 30.0–36.0)
MCV: 86.7 fL (ref 78.0–100.0)
Platelets: 65 10*3/uL — ABNORMAL LOW (ref 150–400)
RBC: 3.98 MIL/uL (ref 3.87–5.11)
RDW: 17.1 % — AB (ref 11.5–15.5)
WBC: 8.1 10*3/uL (ref 4.0–10.5)

## 2013-09-11 LAB — GLUCOSE, CAPILLARY
GLUCOSE-CAPILLARY: 105 mg/dL — AB (ref 70–99)
GLUCOSE-CAPILLARY: 90 mg/dL (ref 70–99)
Glucose-Capillary: 97 mg/dL (ref 70–99)
Glucose-Capillary: 123 mg/dL — ABNORMAL HIGH (ref 70–99)
Glucose-Capillary: 96 mg/dL (ref 70–99)

## 2013-09-11 LAB — BASIC METABOLIC PANEL
BUN: 36 mg/dL — ABNORMAL HIGH (ref 6–23)
BUN: 40 mg/dL — ABNORMAL HIGH (ref 6–23)
CO2: 20 mEq/L (ref 19–32)
CO2: 19 mEq/L (ref 19–32)
CREATININE: 2.46 mg/dL — AB (ref 0.50–1.10)
CREATININE: 2.82 mg/dL — AB (ref 0.50–1.10)
Calcium: 8 mg/dL — ABNORMAL LOW (ref 8.4–10.5)
Calcium: 7.9 mg/dL — ABNORMAL LOW (ref 8.4–10.5)
Chloride: 110 mEq/L (ref 96–112)
Chloride: 110 mEq/L (ref 96–112)
GFR calc non Af Amer: 20 mL/min — ABNORMAL LOW (ref >90)
GFR, EST AFRICAN AMERICAN: 23 mL/min — AB (ref >90)
GFR, EST AFRICAN AMERICAN: 19 mL/min — AB (ref >90)
GFR, EST NON AFRICAN AMERICAN: 17 mL/min — AB (ref >90)
GLUCOSE: 113 mg/dL — AB (ref 70–99)
Glucose, Bld: 126 mg/dL — ABNORMAL HIGH (ref 70–99)
Potassium: 4 mEq/L (ref 3.7–5.3)
Potassium: 4.3 mEq/L (ref 3.7–5.3)
Sodium: 141 mEq/L (ref 137–147)
Sodium: 142 mEq/L (ref 137–147)

## 2013-09-11 MED ORDER — DEXMEDETOMIDINE HCL IN NACL 200 MCG/50ML IV SOLN
0.4000 ug/kg/h | INTRAVENOUS | Status: DC
  Administered 2013-09-11: 0.4 ug/kg/h via INTRAVENOUS
  Administered 2013-09-12: 0.6 ug/kg/h via INTRAVENOUS
  Administered 2013-09-12: 0.8 ug/kg/h via INTRAVENOUS
  Administered 2013-09-12: 0.6 ug/kg/h via INTRAVENOUS
  Administered 2013-09-12: 0.8 ug/kg/h via INTRAVENOUS
  Administered 2013-09-12: 0.4 ug/kg/h via INTRAVENOUS
  Administered 2013-09-12: 0.8 ug/kg/h via INTRAVENOUS
  Filled 2013-09-11 (×8): qty 50

## 2013-09-11 NOTE — Progress Notes (Signed)
PULMONARY / CRITICAL CARE MEDICINE   Name: [name] MRN: [id number] DOB: 10-15-1950    ADMISSION DATE:  08/25/2013 CONSULTATION DATE:  09/07/13  REFERRING MD :  Dr. Maryland Howe PRIMARY SERVICE: [facility]-->PCCM  CHIEF COMPLAINT:  Acute Respiratory Failure  BRIEF PATIENT DESCRIPTION: 63 y/o F admitted with 3 wk hx of weakness, decreased appetite & difficulty swallowing.  Found to have positive strep antigen with concern for L retrocardiac infiltrate.  Progressed to hypercarbic respiratory failure, PCCM consulted.  SIGNIFICANT EVENTS / STUDIES:  3/16 - Admit with 3 wk hx of weakness, decreased appetite & difficulty swallowing.  Found to have positive strep antigen with concern for L retrocardiac infiltrate.  3/17 - Hypercarbic respiratory failure, intubated early am 3/17 - ECHO >> mod LVH, nml systolic fxn, EF 76-73%, nml wall motion, grade II diastolic dysfunction  4/19 - remains on vent, levophed & fentanyl gtt 3/20 - afib with RVR overnight  LINES / TUBES: OETT 3/17>>> L IJ TLC 3/17>>> Port-A-Cath 3/17>>>  CULTURES: BCx2 3/16>>> RVP 3/16>>>neg Sputum 3/16>>>nml flora UC 3/16>>>neg UA 3/16>>>3-6 wbc, few bacteria.  Asymptomatic on admit. Strep Pneumo 3/16>>>positive  ANTIBIOTICS: Azithro 3/16>>>3/17 Cefepime 3/16>>>3/17 Rocephin 3/16>>>  SUBJECTIVE:   Tolerating some high PSV  Sleepy   VITAL SIGNS: Temp:  [97.6 F (36.4 C)-98.8 F (37.1 C)] 97.6 F (36.4 C) (03/21 0800) Pulse Rate:  [71-121] 86 (03/21 1000) Resp:  [0-38] 38 (03/21 1000) BP: (67-154)/(32-88) 104/49 mmHg (03/21 1000) SpO2:  [94 %-100 %] 98 % (03/21 1000) FiO2 (%):  [30 %] 30 % (03/21 0910) Weight:  [101.4 kg (223 lb 8.7 oz)] 101.4 kg (223 lb 8.7 oz) (03/21 0000)  HEMODYNAMICS: CVP:  [12 mmHg-29 mmHg] 12 mmHg  VENTILATOR SETTINGS: Vent Mode:  [-] PRVC FiO2 (%):  [30 %] 30 % Set Rate:  [20 bmp] 20 bmp Vt Set:  [440 mL] 440 mL PEEP:  [5 cmH20] 5 cmH20 Pressure Support:  [15 cmH20] 15  cmH20 Plateau Pressure:  [25 cmH20-30 cmH20] 27 cmH20  INTAKE / OUTPUT: Intake/Output     03/20 0701 - 03/21 0700 03/21 0701 - 03/22 0700   I.V. (mL/kg) 1596.2 (15.7) 204.1 (2)   Other     NG/GT 875.7 30   IV Piggyback 50 50   Total Intake(mL/kg) 2521.8 (24.9) 284.1 (2.8)   Urine (mL/kg/hr) 650 (0.3) 125 (0.3)   Total Output 650 125   Net +1871.8 +159.1          PHYSICAL EXAMINATION: Gen: chronically ill appearing, awake on vent, nods head appropriately HEENT: NCAT, EOMi, ETT in place PULM: resp's even/non-labored on vent, basilar crackles L >R  CV: RRR, systolic murmur noted AB: BS+, soft, nontender, no hsm Ext: warm, trace edema Derm: no rash or skin breakdown Neuro: follows commands, intermittently agitated   LABS:  CBC  Recent Labs Lab 09/09/13 0555 09/10/13 0603 09/11/13 0600  WBC 7.1 6.7 8.1  HGB 10.7* 10.8* 11.5*  HCT 32.4* 32.8* 34.5*  PLT 57* 47* 65*   Coag's  Recent Labs Lab 09/07/13 0904  INR 1.16   BMET  Recent Labs Lab 09/09/13 0555 09/10/13 0603 09/11/13 0600  NA 144 145 141  K 3.4* 3.9 4.0  CL 111 112 110  CO2 $Re'22 22 20  'fKi$ BUN 30* 30* 36*  CREATININE 1.99* 2.19* 2.46*  GLUCOSE 154* 105* 126*   Electrolytes  Recent Labs Lab 09/07/13 0336  09/09/13 0555 09/10/13 0603 09/11/13 0600  CALCIUM 10.2  < > 8.2* 7.8* 8.0*  MG 2.3  --  2.0  --   --   < > = values in this interval not displayed. Sepsis Markers  Recent Labs Lab 09/09/2013 1916 09/07/13 0336 09/07/13 0904 09/08/13 0425 09/09/13 0555  LATICACIDVEN 3.94*  --  1.4  --   --   PROCALCITON  --  0.13  --  0.39 0.41   ABG  Recent Labs Lab 09/07/13 1028 09/08/13 1105 09/10/13 0408  PHART 7.266* 7.305* 7.282*  PCO2ART 54.0* 44.1 47.5*  PO2ART 296.0* 106.0* 83.9   Liver Enzymes  Recent Labs Lab 09/05/2013 1650 09/08/13 0425  AST 41* 38*  ALT 25 20  ALKPHOS 63 73  BILITOT 0.8 0.4  ALBUMIN 2.7* 1.7*   Cardiac Enzymes  Recent Labs Lab 09/08/2013 2250  PROBNP  8205.0*   Glucose  Recent Labs Lab 09/10/13 0814 09/10/13 1058 09/10/13 1614 09/10/13 2032 09/10/13 2326 09/11/13 0312  GLUCAP 129* 117* 131* 111* 101* 105*    Imaging 3/21 CXR > stable bilateral airspace disease, ETT, CVL in place  ASSESSMENT / PLAN:  PULMONARY A: Acute Hypercarbic Respiratory Failure PNA - strep antigen positive, LLL Pulmonary Edema > larger issue 3/19, 10L + positive 3/20 P:   -continue full vent support -SBT / WUA daily, wean PSV as she is able -pulm toilette -diurese when able / BP will tolerate  CARDIOVASCULAR A:  HLD CHF - EF 55-60%, grade II diastolic dysfunction, elevated BNP; some pulm edema 3/19 AM Shock, septic - 3/19 hypotension seems sedation related; all other sepsis markers improved or stable A-Fib - new onset 3/20 pm with RVR, resolved 3/20 am P:  -hold home atenolol, amlodipine, lasix -transitioned from levo to neo 3/20 in setting of afib w RVR, weaning for MAP >65 -kvo IVF -cortisol wnl -diurese when able  RENAL A:   Acute on Chronic Renal Insufficiency - Cr rising and UOP falling Elevated Lactate - resolved Hx Hypercalcemia due to Mult myeloma P:   -trend BMP and UOP > recheck 3/21 pm and in am -kvo fluids   GASTROINTESTINAL A:   GERD Obesity Dysphagia - new hx prior to admit, difficulty swallowing, hx of vomiting P:   -continue TF -will need swallow evaluation post extubation -PPI   HEMATOLOGIC A:   Multiple Myeloma Thrombocytopenia - chronic, runs ~ 130's-180's Hx DVT - 2011, s/p Rx with coumadin P:  -Dr. Alen Howe called for consultation, appreciate input -trend platelets / monitor for bleeding -SCD's for DVT prophylaxis in setting of thrombocytopenia  INFECTIOUS A:   PNA - LL infiltrate, + strep antigen; sepsis markers stable (procalcitonin, WBC, fever) Bacteriuria - asymptomatic on admission P:   -abx as above -follow cultures as above  ENDOCRINE A:   Hyperglycemia  P:   -sensitive SSI -CBG's  Q4  NEUROLOGIC A:   Metabolic Encephalopathy P:   -Fentanyl gtt -monitor neuro assessment   Code status: Dr [name] had lengthy conversation with [name] her sister on 3/18 in person and 3/19 via phone.  She wishes her sister to be full code. Explained to them that with multi-organ failure the likelihood of surviving cardiac arrest is low.   I have personally obtained a history, examined the patient, evaluated laboratory and imaging results, formulated the assessment and plan and placed orders.  CRITICAL CARE: The patient is critically ill with multiple organ systems failure and requires high complexity decision making for assessment and support, frequent evaluation and titration of therapies, application of advanced monitoring technologies and extensive interpretation of multiple databases. Critical Care Time devoted to patient care  services described in this note is 40 minutes.   Baltazar Apo, MD, PhD 09/11/2013, 10:53 AM [facility] [phone] or if no answer [phone]

## 2013-09-12 ENCOUNTER — Inpatient Hospital Stay (HOSPITAL_COMMUNITY): Payer: Medicaid Other

## 2013-09-12 LAB — BASIC METABOLIC PANEL
BUN: 43 mg/dL — AB (ref 6–23)
CHLORIDE: 112 meq/L (ref 96–112)
CO2: 19 mEq/L (ref 19–32)
CREATININE: 2.88 mg/dL — AB (ref 0.50–1.10)
Calcium: 7.9 mg/dL — ABNORMAL LOW (ref 8.4–10.5)
GFR calc non Af Amer: 16 mL/min — ABNORMAL LOW (ref >90)
GFR, EST AFRICAN AMERICAN: 19 mL/min — AB (ref >90)
Glucose, Bld: 144 mg/dL — ABNORMAL HIGH (ref 70–99)
POTASSIUM: 3.8 meq/L (ref 3.7–5.3)
Sodium: 142 mEq/L (ref 137–147)

## 2013-09-12 LAB — MAGNESIUM: MAGNESIUM: 1.9 mg/dL (ref 1.5–2.5)

## 2013-09-12 LAB — CBC
HCT: 32 % — ABNORMAL LOW (ref 36.0–46.0)
Hemoglobin: 10.9 g/dL — ABNORMAL LOW (ref 12.0–15.0)
MCH: 28.9 pg (ref 26.0–34.0)
MCHC: 34.1 g/dL (ref 30.0–36.0)
MCV: 84.9 fL (ref 78.0–100.0)
PLATELETS: 68 10*3/uL — AB (ref 150–400)
RBC: 3.77 MIL/uL — ABNORMAL LOW (ref 3.87–5.11)
RDW: 16.6 % — ABNORMAL HIGH (ref 11.5–15.5)
WBC: 6.4 10*3/uL (ref 4.0–10.5)

## 2013-09-12 LAB — GLUCOSE, CAPILLARY
GLUCOSE-CAPILLARY: 101 mg/dL — AB (ref 70–99)
GLUCOSE-CAPILLARY: 138 mg/dL — AB (ref 70–99)
GLUCOSE-CAPILLARY: 96 mg/dL (ref 70–99)
Glucose-Capillary: 101 mg/dL — ABNORMAL HIGH (ref 70–99)
Glucose-Capillary: 121 mg/dL — ABNORMAL HIGH (ref 70–99)
Glucose-Capillary: 126 mg/dL — ABNORMAL HIGH (ref 70–99)
Glucose-Capillary: 140 mg/dL — ABNORMAL HIGH (ref 70–99)

## 2013-09-12 LAB — PHOSPHORUS: Phosphorus: 2.4 mg/dL (ref 2.3–4.6)

## 2013-09-12 MED ORDER — METOCLOPRAMIDE HCL 5 MG/ML IJ SOLN
10.0000 mg | Freq: Three times a day (TID) | INTRAMUSCULAR | Status: DC
  Administered 2013-09-12 – 2013-09-15 (×9): 10 mg via INTRAVENOUS
  Filled 2013-09-12 (×15): qty 2

## 2013-09-12 NOTE — Progress Notes (Addendum)
PULMONARY / CRITICAL CARE MEDICINE   Name: [name] MRN: [id number] DOB: Feb 24, 1951    ADMISSION DATE:  09/13/2013 CONSULTATION DATE:  09/07/13  REFERRING MD :  Dr. Maryland Pink PRIMARY SERVICE: [facility]-->PCCM  CHIEF COMPLAINT:  Acute Respiratory Failure  BRIEF PATIENT DESCRIPTION: 63 y/o F admitted with 3 wk hx of weakness, decreased appetite & difficulty swallowing.  Found to have positive strep antigen with concern for L retrocardiac infiltrate.  Progressed to hypercarbic respiratory failure, PCCM consulted.  SIGNIFICANT EVENTS / STUDIES:  3/16 - Admit with 3 wk hx of weakness, decreased appetite & difficulty swallowing.  Found to have positive strep antigen with concern for L retrocardiac infiltrate.  3/17 - Hypercarbic respiratory failure, intubated early am 3/17 - ECHO >> mod LVH, nml systolic fxn, EF 16-01%, nml wall motion, grade II diastolic dysfunction  0/93 - remains on vent, levophed & fentanyl gtt 3/20 - afib with RVR overnight  LINES / TUBES: OETT 3/17>>> L IJ TLC 3/17>>> Port-A-Cath 3/17>>>  CULTURES: BCx2 3/16>>> RVP 3/16>>>neg Sputum 3/16>>>nml flora UC 3/16>>>neg UA 3/16>>>3-6 wbc, few bacteria.  Asymptomatic on admit. Strep Pneumo 3/16>>>positive  ANTIBIOTICS: Azithro 3/16>>>3/17 Cefepime 3/16>>>3/17 Rocephin 3/16>>>  SUBJECTIVE:   Has not done PSV yet this am, trying it now  VITAL SIGNS: Temp:  [98.2 F (36.8 C)-99.5 F (37.5 C)] 98.4 F (36.9 C) (03/22 0800) Pulse Rate:  [52-95] 52 (03/22 0800) Resp:  [0-38] 10 (03/22 0800) BP: (87-148)/(43-86) 133/77 mmHg (03/22 0800) SpO2:  [98 %-100 %] 100 % (03/22 0800) FiO2 (%):  [30 %] 30 % (03/22 0800) Weight:  [102.2 kg (225 lb 5 oz)] 102.2 kg (225 lb 5 oz) (03/22 0100)  HEMODYNAMICS: CVP:  [11 mmHg-34 mmHg] 34 mmHg  VENTILATOR SETTINGS: Vent Mode:  [-] PRVC FiO2 (%):  [30 %] 30 % Set Rate:  [20 bmp] 20 bmp Vt Set:  [440 mL] 440 mL PEEP:  [5 cmH20] 5 cmH20 Pressure Support:  [15 cmH20] 15  cmH20 Plateau Pressure:  [25 cmH20-27 cmH20] 27 cmH20  INTAKE / OUTPUT: Intake/Output     03/21 0701 - 03/22 0700 03/22 0701 - 03/23 0700   I.V. (mL/kg) 1418.6 (13.9) 80.1 (0.8)   NG/GT 650 40   IV Piggyback 50    Total Intake(mL/kg) 2118.6 (20.7) 120.1 (1.2)   Urine (mL/kg/hr) 1025 (0.4) 120 (0.5)   Total Output 1025 120   Net +1093.6 +0.1        Stool Occurrence 2 x      PHYSICAL EXAMINATION: Gen: chronically ill appearing, awake on vent, nods head appropriately HEENT: NCAT, EOMi, ETT in place PULM: resp's even/non-labored on vent, basilar crackles L >R  CV: RRR, systolic murmur noted AB: BS+, soft, nontender, no hsm Ext: warm, trace edema Derm: no rash or skin breakdown Neuro: follows commands, intermittently agitated   LABS:  CBC  Recent Labs Lab 09/10/13 0603 09/11/13 0600 09/12/13 0540  WBC 6.7 8.1 6.4  HGB 10.8* 11.5* 10.9*  HCT 32.8* 34.5* 32.0*  PLT 47* 65* 68*   Coag's  Recent Labs Lab 09/07/13 0904  INR 1.16   BMET  Recent Labs Lab 09/11/13 0600 09/11/13 2015 09/12/13 0540  NA 141 142 142  K 4.0 4.3 3.8  CL 110 110 112  CO2 _0 BUN 36* 40* 43*  CREATININE 2.46* 2.82* 2.88*  GLUCOSE 126* 113* 144*   Electrolytes  Recent Labs Lab 09/07/13 0336  09/09/13 0555  09/11/13 0600 09/11/13 2015 09/12/13 0540  CALCIUM 10.2  < >  8.2*  < > 8.0* 7.9* 7.9*  MG 2.3  --  2.0  --   --   --  1.9  PHOS  --   --   --   --   --   --  2.4  < > = values in this interval not displayed. Sepsis Markers  Recent Labs Lab 08/31/2013 1916 09/07/13 0336 09/07/13 0904 09/08/13 0425 09/09/13 0555  LATICACIDVEN 3.94*  --  1.4  --   --   PROCALCITON  --  0.13  --  0.39 0.41   ABG  Recent Labs Lab 09/07/13 1028 09/08/13 1105 09/10/13 0408  PHART 7.266* 7.305* 7.282*  PCO2ART 54.0* 44.1 47.5*  PO2ART 296.0* 106.0* 83.9   Liver Enzymes  Recent Labs Lab 09/13/2013 1650 09/08/13 0425  AST 41* 38*  ALT 25 20  ALKPHOS 63 73  BILITOT 0.8  0.4  ALBUMIN 2.7* 1.7*   Cardiac Enzymes  Recent Labs Lab 09/09/2013 2250  PROBNP 8205.0*   Glucose  Recent Labs Lab 09/11/13 1540 09/11/13 2007 09/12/13 0024 09/12/13 0124 09/12/13 0417 09/12/13 0754  GLUCAP 90 96 96 101* 121* 101*    Imaging 3/21 CXR > stable bilateral airspace disease, ETT, CVL in place  ASSESSMENT / PLAN:  PULMONARY A: Acute Hypercarbic Respiratory Failure PNA - strep antigen positive, LLL Pulmonary Edema > larger issue 3/19, 10L + positive 3/20 P:   -continue full vent support -SBT / WUA daily, wean PSV as she is able -pulm toilette -diurese when able / BP will tolerate  CARDIOVASCULAR A:  HLD CHF - EF 55-60%, grade II diastolic dysfunction, elevated BNP; some pulm edema 3/19 AM Shock, septic - Hypotension also seems sedation related; all other sepsis markers improved or stable A-Fib - new onset 3/20 pm with RVR, resolved 3/20 am P:  -hold home atenolol, amlodipine, lasix -transitioned from levo to neo 3/20 in setting of afib w RVR, weaning for MAP >65 -kvo IVF -cortisol wnl -diurese when able  RENAL A:   Acute on Chronic Renal Insufficiency - Cr appears to be plateauing Elevated Lactate - resolved Hx Hypercalcemia due to Mult myeloma P:   -trend BMP and UOP > recheck 3/23 -kvo fluids  GASTROINTESTINAL A:   GERD Obesity Dysphagia - new hx prior to admit, difficulty swallowing, hx of vomiting P:   -continue TF >> add reglan on 3/22 for high residuals (goal is 40cc/h) -will need swallow evaluation post extubation -PPI   HEMATOLOGIC A:   Multiple Myeloma Thrombocytopenia - chronic, runs ~ 130's-180's; rebounding > 68k on 3/22 Hx DVT - 2011, s/p Rx with coumadin P:  -Dr. Alen Blew called for consultation, appreciate input -trend platelets / monitor for bleeding -SCD's for DVT prophylaxis in setting of thrombocytopenia  INFECTIOUS A:   PNA - LL infiltrate, + strep antigen; sepsis markers stable (procalcitonin, WBC,  fever) Bacteriuria - asymptomatic on admission P:   -abx as above -follow cultures as above  ENDOCRINE A:   Hyperglycemia  P:   -sensitive SSI -CBG's Q4  NEUROLOGIC A:   Metabolic Encephalopathy P:   -Fentanyl gtt -monitor neuro assessment   Code status: Dr Lake Bells had lengthy conversation with [name] her sister on 3/18 in person and 3/19 via phone.  She wishes her sister to be full code. Explained to them that with multi-organ failure the likelihood of surviving cardiac arrest is low.   Dr Lamonte Sakai updated [name] 3/22 by phone. Explained lack of real progress through the weekend (but also no real decline).  Revisited issue of ACLS, low likelihood of survival of CPR. She has discussed this with other family, is not ready to change code status from full code at this time.   I have personally obtained a history, examined the patient, evaluated laboratory and imaging results, formulated the assessment and plan and placed orders.  CRITICAL CARE: The patient is critically ill with multiple organ systems failure and requires high complexity decision making for assessment and support, frequent evaluation and titration of therapies, application of advanced monitoring technologies and extensive interpretation of multiple databases. Critical Care Time devoted to patient care services described in this note is 40 minutes.   Baltazar Apo, MD, PhD 09/12/2013, 9:15 AM [facility] [phone] or if no answer [phone]

## 2013-09-12 NOTE — Plan of Care (Signed)
Problem: Consults Goal: Respiratory Problems Patient Education See Patient Education Module for education specifics. Outcome: Not Progressing 09/08/13 NP held discussion with Sister (Nurse) and Niece at bedside and reviewed status of multi organ failure. Pt has had little success with short term breathing trials.

## 2013-09-12 NOTE — Progress Notes (Signed)
RT moved bite block to reposition and saw a sore where the bite block was. Charge RN stated she would put a vasaline gauze on the area. Patient consistantley biting down on tube and bite block. RT will continue to monitor.

## 2013-09-13 ENCOUNTER — Inpatient Hospital Stay (HOSPITAL_COMMUNITY): Payer: Medicaid Other

## 2013-09-13 LAB — CBC
HCT: 31.8 % — ABNORMAL LOW (ref 36.0–46.0)
Hemoglobin: 10.7 g/dL — ABNORMAL LOW (ref 12.0–15.0)
MCH: 28.8 pg (ref 26.0–34.0)
MCHC: 33.6 g/dL (ref 30.0–36.0)
MCV: 85.7 fL (ref 78.0–100.0)
PLATELETS: 69 10*3/uL — AB (ref 150–400)
RBC: 3.71 MIL/uL — ABNORMAL LOW (ref 3.87–5.11)
RDW: 16.6 % — AB (ref 11.5–15.5)
WBC: 6.9 10*3/uL (ref 4.0–10.5)

## 2013-09-13 LAB — BASIC METABOLIC PANEL
BUN: 50 mg/dL — ABNORMAL HIGH (ref 6–23)
CO2: 18 mEq/L — ABNORMAL LOW (ref 19–32)
CREATININE: 3.26 mg/dL — AB (ref 0.50–1.10)
Calcium: 8.1 mg/dL — ABNORMAL LOW (ref 8.4–10.5)
Chloride: 111 mEq/L (ref 96–112)
GFR, EST AFRICAN AMERICAN: 16 mL/min — AB (ref >90)
GFR, EST NON AFRICAN AMERICAN: 14 mL/min — AB (ref >90)
Glucose, Bld: 147 mg/dL — ABNORMAL HIGH (ref 70–99)
Potassium: 3.7 mEq/L (ref 3.7–5.3)
Sodium: 143 mEq/L (ref 137–147)

## 2013-09-13 LAB — GLUCOSE, CAPILLARY
GLUCOSE-CAPILLARY: 114 mg/dL — AB (ref 70–99)
GLUCOSE-CAPILLARY: 131 mg/dL — AB (ref 70–99)
Glucose-Capillary: 107 mg/dL — ABNORMAL HIGH (ref 70–99)
Glucose-Capillary: 130 mg/dL — ABNORMAL HIGH (ref 70–99)
Glucose-Capillary: 137 mg/dL — ABNORMAL HIGH (ref 70–99)
Glucose-Capillary: 145 mg/dL — ABNORMAL HIGH (ref 70–99)
Glucose-Capillary: 154 mg/dL — ABNORMAL HIGH (ref 70–99)

## 2013-09-13 LAB — CULTURE, BLOOD (ROUTINE X 2)
CULTURE: NO GROWTH
Culture: NO GROWTH

## 2013-09-13 LAB — PHOSPHORUS: Phosphorus: 2.9 mg/dL (ref 2.3–4.6)

## 2013-09-13 LAB — MAGNESIUM: Magnesium: 2 mg/dL (ref 1.5–2.5)

## 2013-09-13 MED ORDER — SODIUM CHLORIDE 0.9 % IJ SOLN
10.0000 mL | INTRAMUSCULAR | Status: DC | PRN
  Administered 2013-09-13 – 2013-09-22 (×2): 10 mL

## 2013-09-13 MED ORDER — DEXMEDETOMIDINE HCL IN NACL 400 MCG/100ML IV SOLN
0.4000 ug/kg/h | INTRAVENOUS | Status: DC
  Administered 2013-09-13: 0.4 ug/kg/h via INTRAVENOUS
  Administered 2013-09-13: 0.8 ug/kg/h via INTRAVENOUS
  Administered 2013-09-13 – 2013-09-15 (×4): 0.4 ug/kg/h via INTRAVENOUS
  Filled 2013-09-13 (×7): qty 100

## 2013-09-13 MED ORDER — HEPARIN SOD (PORK) LOCK FLUSH 100 UNIT/ML IV SOLN
500.0000 [IU] | INTRAVENOUS | Status: AC | PRN
  Administered 2013-09-13: 500 [IU]

## 2013-09-13 NOTE — Progress Notes (Signed)
PULMONARY / CRITICAL CARE MEDICINE   Name: [name] MRN: [id number] DOB: 13-Jul-1950    ADMISSION DATE:  09/12/2013 CONSULTATION DATE:  09/07/13  REFERRING MD :  Dr. [place] Pink PRIMARY SERVICE: TR[facility]-->PCCM  CHIEF COMPLAINT:  Acute Respiratory Failure  BRIEF PATIENT DESCRIPTION: 63 y/o F admitted with 3 wk hx of weakness, decreased appetite & difficulty swallowing.  Found to have positive strep antigen with concern for L retrocardiac infiltrate.  Progressed to hypercarbic respiratory failure, PCCM consulted.  SIGNIFICANT EVENTS / STUDIES:  3/16 - Admit with 3 wk hx of weakness, decreased appetite & difficulty swallowing.  Found to have positive strep antigen with concern for L retrocardiac infiltrate.  3/17 - Hypercarbic respiratory failure, intubated early am 3/17 - ECHO >> mod LVH, nml systolic fxn, EF 98-33%, nml wall motion, grade II diastolic dysfunction  8/25 - remains on vent, levophed & fentanyl gtt 3/20 - afib with RVR overnight 3/23 failed weaning protocol  LINES / TUBES: OETT 3/17>>> L IJ TLC 3/17>>> Port-A-Cath 3/17>>>  CULTURES: BCx2 3/16>>>neg RVP 3/16>>>neg Sputum 3/16>>>nml flora UC 3/16>>>neg UA 3/16>>>3-6 wbc, few bacteria.  Asymptomatic on admit. Strep Pneumo 3/16>>>positive  ANTIBIOTICS: Azithro 3/16>>>3/17 Cefepime 3/16>>>3/17 Rocephin 3/16>>>  SUBJECTIVE:   Failed weaning due to lack of resp effort. Sedation off, still on pressors.  VITAL SIGNS: Temp:  [98.4 F (36.9 C)-99.9 F (37.7 C)] 99.2 F (37.3 C) (03/23 0800) Pulse Rate:  [71-119] 84 (03/23 1100) Resp:  [0-31] 24 (03/23 1100) BP: (79-146)/(36-81) 90/50 mmHg (03/23 1100) SpO2:  [94 %-100 %] 100 % (03/23 1100) FiO2 (%):  [30 %] 30 % (03/23 1023) Weight:  [103.3 kg (227 lb 11.8 oz)] 103.3 kg (227 lb 11.8 oz) (03/23 0400)  HEMODYNAMICS: CVP:  [8 mmHg-15 mmHg] 15 mmHg  VENTILATOR SETTINGS: Vent Mode:  [-] PRVC FiO2 (%):  [30 %] 30 % Set Rate:  [20 bmp] 20 bmp Vt Set:  [440  mL] 440 mL PEEP:  [5 cmH20] 5 cmH20 Pressure Support:  [15 cmH20] 15 cmH20 Plateau Pressure:  [21 cmH20-28 cmH20] 22 cmH20  INTAKE / OUTPUT: Intake/Output     03/22 0701 - 03/23 0700 03/23 0701 - 03/24 0700   I.V. (mL/kg) 1112.2 (10.8)    NG/GT 460    IV Piggyback 50    Total Intake(mL/kg) 1622.2 (15.7)    Urine (mL/kg/hr) 640 (0.3) 75 (0.2)   Total Output 640 75   Net +982.2 -75        Stool Occurrence 1 x      PHYSICAL EXAMINATION: Gen: chronicallyand critically  ill appearing, awake on vent, nods head appropriately HEENT: NCAT, EOMi, ETT in place PULM: resp's even/non-labored on vent. Diminished bs bases  CV: RRR, systolic murmur noted AB: BS+, soft, nontender, no hsm Ext: warm, trace edema Derm: no rash or skin breakdown Neuro: follows commands, intermittently agitated   LABS:   PULMONARY  Recent Labs Lab 09/07/13 0749 09/07/13 0900 09/07/13 1028 09/08/13 1105 09/10/13 0408  PHART 7.031*  --  7.266* 7.305* 7.282*  PCO2ART 0.0*  --  54.0* 44.1 47.5*  PO2ART 93.6  --  296.0* 106.0* 83.9  HCO3  --   --  23.8 21.3 21.7  TCO2  --   --  21.8 19.8 20.3  O2SAT 93.1 82.4 99.4 97.6 96.1    CBC  Recent Labs Lab 09/11/13 0600 09/12/13 0540 09/13/13 0214  HGB 11.5* 10.9* 10.7*  HCT 34.5* 32.0* 31.8*  WBC 8.1 6.4 6.9  PLT 65* 68* 69*  COAGULATION  Recent Labs Lab 09/07/13 0904  INR 1.16    CARDIAC  No results found for this basename: TROPONINI,  in the last 168 hours  Recent Labs Lab 09/16/2013 2250  PROBNP 8205.0*     CHEMISTRY  Recent Labs Lab 09/07/13 0336  09/09/13 0555 09/10/13 0603 09/11/13 0600 09/11/13 2015 09/12/13 0540 09/13/13 0214  NA 141  < > 144 145 141 142 142 143  K 4.1  < > 3.4* 3.9 4.0 4.3 3.8 3.7  CL 114*  < > 111 112 110 110 112 111  CO2 27  < > $R'22 22 20 19 19 'oa$ 18*  GLUCOSE 166*  < > 154* 105* 126* 113* 144* 147*  BUN 25*  < > 30* 30* 36* 40* 43* 50*  CREATININE 1.17*  < > 1.99* 2.19* 2.46* 2.82* 2.88* 3.26*   CALCIUM 10.2  < > 8.2* 7.8* 8.0* 7.9* 7.9* 8.1*  MG 2.3  --  2.0  --   --   --  1.9 2.0  PHOS  --   --   --   --   --   --  2.4 2.9  < > = values in this interval not displayed. Estimated Creatinine Clearance: 20.7 ml/min (by C-G formula based on Cr of 3.26).   LIVER  Recent Labs Lab 09/08/2013 1650 09/07/13 0904 09/08/13 0425  AST 41*  --  38*  ALT 25  --  20  ALKPHOS 63  --  73  BILITOT 0.8  --  0.4  PROT 8.3  --  6.4  ALBUMIN 2.7*  --  1.7*  INR  --  1.16  --      INFECTIOUS  Recent Labs Lab 08/24/2013 1916 09/07/13 0336 09/07/13 0904 09/08/13 0425 09/09/13 0555  LATICACIDVEN 3.94*  --  1.4  --   --   PROCALCITON  --  0.13  --  0.39 0.41     ENDOCRINE CBG (last 3)   Recent Labs  09/12/13 2351 09/13/13 0440 09/13/13 0742  GLUCAP 154* 107* 114*         IMAGING x48h  Dg Chest Port 1 View  09/13/2013   CLINICAL DATA:  Evaluate endotracheal tube position.  EXAM: PORTABLE CHEST - 1 VIEW  COMPARISON:  Chest x-ray 09/12/2013.  FINDINGS: An endotracheal tube is in place with tip 3.6 cm above the carina. Right internal jugular porta cath with tip terminating in the distal superior vena cava. There is a left-sided internal jugular central venous catheter with tip terminating in the distal superior vena cava. Patchy multifocal interstitial and airspace disease throughout the lungs bilaterally, most confluent in the lung bases. Trace bilateral pleural effusions. Mild cardiomegaly. The patient is rotated to the right on today's exam, resulting in distortion of the mediastinal contours and reduced diagnostic sensitivity and specificity for mediastinal pathology. Atherosclerosis in the thoracic aorta.  IMPRESSION: 1. Support apparatus, as above. 2. The appearance of the lungs is suggestive of multilobar pneumonia, most severe in the lower lobes of the lungs bilaterally. 3. Trace bilateral pleural effusions. 4. Atherosclerosis.   Electronically Signed   By: Vinnie Langton  M.D.   On: 09/13/2013 05:51   Dg Chest Port 1 View  09/12/2013   CLINICAL DATA:  Endotracheal tube.  EXAM: PORTABLE CHEST - 1 VIEW  COMPARISON:  September 11, 2013.  FINDINGS: Endotracheal tube is in grossly good position with distal tip 4 cm above the carina. Right internal jugular Port-A-Cath in left internal jugular central venous  catheter are unchanged in position with tips in expected position of the SVC. No pneumothorax or significant pleural effusion is noted. Mild bilateral pulmonary edema is noted which appears to be improved compared to prior exam.  IMPRESSION: Endotracheal tube in grossly good position. Other support apparatus are unchanged. Mild bilateral pulmonary edema is noted which is improved compared to prior exam.   Electronically Signed   By: Sabino Dick M.D.   On: 09/12/2013 08:48      ASSESSMENT / PLAN:  PULMONARY A: Acute Hypercarbic Respiratory Failure PNA - strep antigen positive, LLL Pulmonary Edema > larger issue 3/19, 10L + positive 3/20 P:   -continue full vent support -SBT / WUA daily, wean PSV as she is able -pulm toilette -diurese when able / BP will tolerate  CARDIOVASCULAR A:  HLD CHF - EF 55-60%, grade II diastolic dysfunction, elevated BNP; some pulm edema 3/19 AM Shock, septic - Hypotension also seems sedation related; all other sepsis markers improved or stable A-Fib - new onset 3/20 pm with RVR, resolved 3/20 am P:  -hold home atenolol, amlodipine, lasix -transitioned from levo to neo 3/20 in setting of afib w RVR, weaning for MAP >65 -kvo IVF -cortisol wnl -diurese when able  RENAL Lab Results  Component Value Date   CREATININE 3.26* 09/13/2013   CREATININE 2.88* 09/12/2013   CREATININE 2.82* 09/11/2013   CREATININE 1.0 08/27/2013   CREATININE 0.8 07/22/2013   CREATININE 1.1 06/23/2013    A:   Acute on Chronic Renal Insufficiency - Cr appears to be worsening Elevated Lactate - resolved Hx Hypercalcemia due to Mult myeloma P:   -trend  BMP and UOP > recheck 3/24 -kvo fluids -no diuresis  GASTROINTESTINAL A:   GERD Obesity Dysphagia - new hx prior to admit, difficulty swallowing, hx of vomiting P:   -continue TF >> add reglan on 3/22 for high residuals (goal is 40cc/h) -will need swallow evaluation post extubation -PPI   HEMATOLOGIC A:   Multiple Myeloma Thrombocytopenia - chronic, runs ~ 130's-180's; rebounding > 68k on 3/22 Hx DVT - 2011, s/p Rx with coumadin P:  -Dr. Alen Blew called for consultation, appreciate input -trend platelets / monitor for bleeding -SCD's for DVT prophylaxis in setting of thrombocytopenia  INFECTIOUS A:   PNA - LL infiltrate, + strep antigen; sepsis markers stable (procalcitonin, WBC, fever) Bacteriuria - asymptomatic on admission P:   -abx as above -follow cultures as above  ENDOCRINE A:   Hyperglycemia  P:   -sensitive SSI -CBG's Q4  NEUROLOGIC A:   Metabolic Encephalopathy P:   -Fentanyl gtt -monitor neuro assessment   Code status: Dr Lake Bells had lengthy conversation with [name] her sister on 3/18 in person and 3/19 via phone.  She wishes her sister to be full code. Explained to them that with multi-organ failure the likelihood of surviving cardiac arrest is low.   Dr Lamonte Sakai updated [name] 3/22 by phone. Explained lack of real progress through the weekend (but also no real decline). Revisited issue of ACLS, low likelihood of survival of CPR. She has discussed this with other family, is not ready to change code status from full code at this time.   Richardson Landry Minor ACNP Maryanna Shape PCCM Pager [phone] till 3 pm If no answer page [phone] 09/13/2013, 11:24 AM  STAFF NOTE: I, Dr Ann Lions have personally reviewed patient's available data, including medical history, events of note, physical examination and test results as part of my evaluation. I have discussed with resident/NP and other care  providers such as pharmacist, RN and RRT.  In addition,  I personally evaluated patient  and elicited key findings of  Acute resp failure, septic shock and worsening renal failure. She can follow commands. Failed SBT. No family at bedside.  Rest per NP/medical resident whose note is outlined above and that I agree with  The patient is critically ill with multiple organ systems failure and requires high complexity decision making for assessment and support, frequent evaluation and titration of therapies, application of advanced monitoring technologies and extensive interpretation of multiple databases.   Critical Care Time devoted to patient care services described in this note is  35  Minutes.  Dr. Brand Males, M.D., [facility].C.P Pulmonary and Critical Care Medicine Staff Physician [facility] Pager: [phone], If no answer or between  15:00h - 7:00h: call [phone]  09/13/2013 12:05 PM

## 2013-09-14 ENCOUNTER — Inpatient Hospital Stay (HOSPITAL_COMMUNITY): Payer: Medicaid Other

## 2013-09-14 LAB — GLUCOSE, CAPILLARY
GLUCOSE-CAPILLARY: 147 mg/dL — AB (ref 70–99)
Glucose-Capillary: 136 mg/dL — ABNORMAL HIGH (ref 70–99)
Glucose-Capillary: 142 mg/dL — ABNORMAL HIGH (ref 70–99)
Glucose-Capillary: 161 mg/dL — ABNORMAL HIGH (ref 70–99)
Glucose-Capillary: 164 mg/dL — ABNORMAL HIGH (ref 70–99)

## 2013-09-14 LAB — CBC
HCT: 28.1 % — ABNORMAL LOW (ref 36.0–46.0)
HEMOGLOBIN: 9.7 g/dL — AB (ref 12.0–15.0)
MCH: 29 pg (ref 26.0–34.0)
MCHC: 34.5 g/dL (ref 30.0–36.0)
MCV: 83.9 fL (ref 78.0–100.0)
PLATELETS: 68 10*3/uL — AB (ref 150–400)
RBC: 3.35 MIL/uL — AB (ref 3.87–5.11)
RDW: 16.4 % — ABNORMAL HIGH (ref 11.5–15.5)
WBC: 4.2 10*3/uL (ref 4.0–10.5)

## 2013-09-14 LAB — BASIC METABOLIC PANEL
BUN: 56 mg/dL — AB (ref 6–23)
CHLORIDE: 115 meq/L — AB (ref 96–112)
CO2: 19 meq/L (ref 19–32)
Calcium: 7.9 mg/dL — ABNORMAL LOW (ref 8.4–10.5)
Creatinine, Ser: 3.57 mg/dL — ABNORMAL HIGH (ref 0.50–1.10)
GFR calc Af Amer: 15 mL/min — ABNORMAL LOW (ref >90)
GFR calc non Af Amer: 13 mL/min — ABNORMAL LOW (ref >90)
GLUCOSE: 150 mg/dL — AB (ref 70–99)
Potassium: 3.5 mEq/L — ABNORMAL LOW (ref 3.7–5.3)
Sodium: 146 mEq/L (ref 137–147)

## 2013-09-14 LAB — PHOSPHORUS: Phosphorus: 3 mg/dL (ref 2.3–4.6)

## 2013-09-14 LAB — MAGNESIUM: Magnesium: 1.9 mg/dL (ref 1.5–2.5)

## 2013-09-14 MED ORDER — PANTOPRAZOLE SODIUM 40 MG PO PACK
40.0000 mg | PACK | Freq: Every day | ORAL | Status: DC
  Administered 2013-09-14 – 2013-09-16 (×3): 40 mg
  Filled 2013-09-14 (×5): qty 20

## 2013-09-14 MED ORDER — HYDROCORTISONE NA SUCCINATE PF 100 MG IJ SOLR
50.0000 mg | Freq: Four times a day (QID) | INTRAMUSCULAR | Status: DC
  Administered 2013-09-14 – 2013-09-16 (×9): 50 mg via INTRAVENOUS
  Filled 2013-09-14: qty 1
  Filled 2013-09-14: qty 2
  Filled 2013-09-14 (×4): qty 1
  Filled 2013-09-14: qty 2
  Filled 2013-09-14 (×4): qty 1
  Filled 2013-09-14: qty 2

## 2013-09-14 NOTE — Consult Note (Signed)
Renal Service Consult Note [facility]  NEGIN HEGG 09/14/2013 Sol Blazing Requesting Physician:  Dr Pennie Banter  Reason for Consult:  Acute renal failure, hx myeloma  HPI: The patient is a 63 y.o. year-old with hx of obesity, HTN, DM and multiple myeloma dx'd in 2008.  Currently she is on treatment break, last cycle finished in Jan 2015 (Kyprolis). Pt presented on 3/16 w weakness, anorexia, swallowing difficulty and LLL infiltrate.  Progressed to resp failure, intubated on 3/17.  Had +strep antigen.  ECHO was normal.  Was in shock on pressors, vent, fentanyl on 3/18.  Failed attempt to wean off vent on 3/23.  Creat climbing since admission 1.17 up to 3.57 today.  BUN 56, K 3.5, CO2 19.  CXR's show mild bilat pulm edema. CVP's 15-34 the last week, 9 today.  UOP 600-800 cc /day last 2 days.  Wt up from 88kg on admission to 101kg today.    UA 3/16  100 prot, 3-6 wbc and rbc  Chart Review 2005 > vag bleeding, hysteroscopy w resection endometrial nodules 2008 > admit for N/V, creat 8 > workup showed multiple myeloma with IgG kappa M-spike and kappa LC's in the urine. [place] was neg. Creat improved to 5 at discharge. Treated w decadron by oncology. +diffuse bone pain with lytic lesions and L2 fracture May/June 2012 >  SBO due to incarcerated ventral hernia; repaired.  June 2012 > Acute kidney injury, RLE DVT, MRSA abd wall abcess I&D'd, DM, MO. Creat around 1.29 Jan 2011 > acute on CRF, Creat 5.2, 6K, met acidosis and pancytopenia. Was on Revlimid for myeloma at the time. Creat improved to 2.7 with IVF's Oct 2012 > recurrent MRSA abd wall abcesses, DM, myeloma. Creat was 1.2  Myeloma Rx  2008 melphalan and prednisone, FLC down 1500 > 19, M spike down to 0.52 g/dL 2009-2012 Revlimid, partial response, chg'd to maint Revlimid Rx 01/2011 > 01/2102 Velcade / weekly dexamethasone, held at times due to infections 01/2012 > started on Kyprolis took 6 mos through Jan 2014, resumed from  Jun 2014 > jan 2015 then put on treatment break    ROS  not available  Past Medical History  Past Medical History  Diagnosis Date  . Small bowel obstruction   . Diabetes mellitus   . Hypertension   . Hyperlipidemia   . Chronic renal insufficiency   . Multiple myeloma   . Abdominal pain   . Ventral hernia   . Congestive heart failure   . GERD (gastroesophageal reflux disease)   . Renal insufficiency   . Obesity   . Depression   . Anxiety   . Abscess     groin   Past Surgical History  Past Surgical History  Procedure Laterality Date  . Hernia repair      ventral hernia  . Exploratory laparotomy    . Oophorectomy    . Breast surgery      reduction  . Panniculectomy    . Incise and drain abcess      groin   Family History  Family History  Problem Relation Age of Onset  . Heart disease Mother    Social History  reports that she has quit smoking. She has never used smokeless tobacco. She reports that she does not drink alcohol or use illicit drugs. Allergies  Allergies  Allergen Reactions  . Ibuprofen Other (See Comments)    Patient does not remember.   . Morphine And Related Other (See Comments)  Patient does not remember reaction, believes it caused raised marks on skin.  . Other Rash    Tegaderm dressing   Home medications Prior to Admission medications   Medication Sig Start Date End Date Taking? Authorizing Provider  Alum & Mag Hydroxide-Simeth (MAGIC MOUTHWASH W/LIDOCAINE) SOLN Take 5 mLs by mouth 4 (four) times daily. 08/06/13  Yes Wyatt Portela, MD  amLODipine (NORVASC) 5 MG tablet Take 1 tablet (5 mg total) by mouth daily. 09/22/12  Yes Theodis Blaze, MD  atenolol (TENORMIN) 25 MG tablet Take 12.5 mg by mouth daily. 09/22/12  Yes Theodis Blaze, MD  buPROPion (WELLBUTRIN XL) 150 MG 24 hr tablet Take 1 tablet (150 mg total) by mouth daily. 09/22/12  Yes Theodis Blaze, MD  diazepam (VALIUM) 5 MG tablet Take 1 tablet (5 mg total) by mouth every 8 (eight)  hours as needed for anxiety. 01/05/13  Yes Wyatt Portela, MD  furosemide (LASIX) 40 MG tablet Take 1 tablet (40 mg total) by mouth daily. 09/22/12  Yes Theodis Blaze, MD  lidocaine-prilocaine (EMLA) cream Apply topically as needed. 04/22/13 04/22/14 Yes Wyatt Portela, MD  loratadine (CLARITIN) 10 MG tablet Take 10 mg by mouth daily.     Yes Historical Provider, MD  Multiple Vitamin (MULTIVITAMIN) tablet Take 1 tablet by mouth daily.     Yes Historical Provider, MD  ondansetron (ZOFRAN) 8 MG tablet Take 1 tablet (8 mg total) by mouth every 8 (eight) hours as needed. 08/30/13  Yes Wyatt Portela, MD  oxyCODONE (ROXICODONE) 5 MG/5ML solution Take 5-10 mLs (5-10 mg total) by mouth every 4 (four) hours as needed for severe pain (Take 5-10 mls every 4 hours as needed for pain.). 08/27/13  Yes Maryanna Shape, NP  temazepam (RESTORIL) 15 MG capsule Take 1 capsule (15 mg total) by mouth at bedtime as needed. 07/07/12  Yes Wyatt Portela, MD   Liver Function Tests  Recent Labs Lab 09/08/13 0425  AST 38*  ALT 20  ALKPHOS 73  BILITOT 0.4  PROT 6.4  ALBUMIN 1.7*   No results found for this basename: LIPASE, AMYLASE,  in the last 168 hours CBC  Recent Labs Lab 09/10/13 0603  09/12/13 0540 09/13/13 0214 09/14/13 0516  WBC 6.7  < > 6.4 6.9 4.2  NEUTROABS 5.5  --   --   --   --   HGB 10.8*  < > 10.9* 10.7* 9.7*  HCT 32.8*  < > 32.0* 31.8* 28.1*  MCV 86.5  < > 84.9 85.7 83.9  PLT 47*  < > 68* 69* 68*  < > = values in this interval not displayed. Basic Metabolic Panel  Recent Labs Lab 09/09/13 0555 09/10/13 0603 09/11/13 0600 09/11/13 2015 09/12/13 0540 09/13/13 0214 09/14/13 0516  NA 144 145 141 142 142 143 146  K 3.4* 3.9 4.0 4.3 3.8 3.7 3.5*  CL 111 112 110 110 112 111 115*  CO2 $Re'22 22 20 19 19 'bCo$ 18* 19  GLUCOSE 154* 105* 126* 113* 144* 147* 150*  BUN 30* 30* 36* 40* 43* 50* 56*  CREATININE 1.99* 2.19* 2.46* 2.82* 2.88* 3.26* 3.57*  CALCIUM 8.2* 7.8* 8.0* 7.9* 7.9* 8.1* 7.9*  PHOS   --   --   --   --  2.4 2.9 3.0    Filed Vitals:   09/14/13 1715 09/14/13 1730 09/14/13 1745 09/14/13 1800  BP: 118/71 123/62 114/66 119/64  Pulse: 83 76 82 74  Temp:  TempSrc:      Resp: $Remo'23 23 27 24  'QDUFo$ Height:      Weight:      SpO2: 100% 100% 100% 100%   Exam On vent, opens eyes, does not follow commands No rash, cyanosis or gangrene Sclera anicteric, throat clear +JVD Chest scattered bilat rhonchi RRR no MRG Abd obese, nondistended Ext moderate 2+ diffuse LE edema Neuro moves all extremities   Assessment: 1 Acute renal failure- likely ATN from refractory shock on pressors since admission. Also consider AIN from meds.  2 Vol overload- prob w pulm edema as well 3 PNA / VDRF- on rocephin 4 Multiple myeloma 5 Septic shock on pressors 6 Anemia Hb 9.7 7 DM on insulin SSI 8 Nutrition on TF's, reglan IV   Plan- UOP marginal and suspect renal function will continue to worsen.  She likely has component of pulm edema related to general volume overload. Recommend transfer to [facility] in anticipation of CRRT in next 12-24hrs.   Kelly Splinter MD (pgr) [phone]    [phone] 09/14/2013, 6:30 PM

## 2013-09-14 NOTE — Progress Notes (Signed)
PULMONARY / CRITICAL CARE MEDICINE   Name: [name] MRN: [id number] DOB: 1950/10/07    ADMISSION DATE:  09/17/2013 CONSULTATION DATE:  09/07/13  REFERRING MD :  Dr. Maryland Pink PRIMARY SERVICE: TRH-->PCCM  CHIEF COMPLAINT:  Acute Respiratory Failure  BRIEF PATIENT DESCRIPTION: 63 y/o F admitted with 3 wk hx of weakness, decreased appetite & difficulty swallowing.  Found to have positive strep antigen with concern for L retrocardiac infiltrate.  Progressed to hypercarbic respiratory failure, PCCM consulted.  SIGNIFICANT EVENTS / STUDIES:  3/16 - Admit with 3 wk hx of weakness, decreased appetite & difficulty swallowing.  Found to have positive strep antigen with concern for L retrocardiac infiltrate.  3/17 - Hypercarbic respiratory failure, intubated early am 3/17 - ECHO >> mod LVH, nml systolic fxn, EF 66-44%, nml wall motion, grade II diastolic dysfunction  0/34 - remains on vent, levophed & fentanyl gtt 3/20 - afib with RVR overnight 3/23 failed weaning protocol 3/24 rr 45 on ps 10 3/24 renal US >>  LINES / TUBES: OETT 3/17>>> L IJ TLC 3/17>>> Port-A-Cath 3/17>>>  CULTURES: BCx2 3/16>>>neg RVP 3/16>>>neg Sputum 3/16>>>nml flora UC 3/16>>>neg UA 3/16>>>3-6 wbc, few bacteria.  Asymptomatic on admit. Strep Pneumo 3/16>>>positive  ANTIBIOTICS: Azithro 3/16>>>3/17 Cefepime 3/16>>>3/17 Rocephin 3/16>>>  SUBJECTIVE:   Failed weaning due to rr 44 on ps 10, remains on pressor  VITAL SIGNS: Temp:  [98.6 F (37 C)-100.4 F (38 C)] 100.4 F (38 C) (03/24 0800) Pulse Rate:  [34-117] 92 (03/24 0819) Resp:  [0-34] 34 (03/24 0819) BP: (90-126)/(37-94) 118/51 mmHg (03/24 0819) SpO2:  [98 %-100 %] 100 % (03/24 0800) FiO2 (%):  [30 %] 30 % (03/24 0819)  HEMODYNAMICS: CVP:  [12 mmHg-28 mmHg] 12 mmHg  VENTILATOR SETTINGS: Vent Mode:  [-] PSV;CPAP FiO2 (%):  [30 %] 30 % Set Rate:  [20 bmp] 20 bmp Vt Set:  [440 mL] 440 mL PEEP:  [5 cmH20] 5 cmH20 Pressure Support:   [10 cmH20] 10 cmH20 Plateau Pressure:  [15 cmH20-24 cmH20] 23 cmH20  INTAKE / OUTPUT: Intake/Output     03/23 0701 - 03/24 0700 03/24 0701 - 03/25 0700   I.V. (mL/kg) 1282.3 (12.4) 50.8 (0.5)   NG/GT     IV Piggyback 50    Total Intake(mL/kg) 1332.3 (12.9) 50.8 (0.5)   Urine (mL/kg/hr) 805 (0.3) 100 (0.4)   Total Output 805 100   Net +527.3 -49.2          PHYSICAL EXAMINATION: Gen: chronicallyand critically  ill appearing, awake on vent, nods head appropriately HEENT: NCAT, EOMi, ETT in place PULM: resp's even/non-labored on vent. Diminished bs bases , rr 44 on 10 ps, back to full support CV: RRR, systolic murmur noted AB: BS+, soft, nontender, no hsm, tf at goal Ext: warm, trace edema Derm: no rash or skin breakdown Neuro: follows commands, intermittently agitated   LABS:   PULMONARY  Recent Labs Lab 09/07/13 1028 09/08/13 1105 09/10/13 0408  PHART 7.266* 7.305* 7.282*  PCO2ART 54.0* 44.1 47.5*  PO2ART 296.0* 106.0* 83.9  HCO3 23.8 21.3 21.7  TCO2 21.8 19.8 20.3  O2SAT 99.4 97.6 96.1    CBC  Recent Labs Lab 09/12/13 0540 09/13/13 0214 09/14/13 0516  HGB 10.9* 10.7* 9.7*  HCT 32.0* 31.8* 28.1*  WBC 6.4 6.9 4.2  PLT 68* 69* 68*    COAGULATION No results found for this basename: INR,  in the last 168 hours  CARDIAC  No results found for this basename: TROPONINI,  in the last 168  hours No results found for this basename: PROBNP,  in the last 168 hours   CHEMISTRY  Recent Labs Lab 09/09/13 0555  09/11/13 0600 09/11/13 2015 09/12/13 0540 09/13/13 0214 09/14/13 0516  NA 144  < > 141 142 142 143 146  K 3.4*  < > 4.0 4.3 3.8 3.7 3.5*  CL 111  < > 110 110 112 111 115*  CO2 22  < > $R'20 19 19 'kZ$ 18* 19  GLUCOSE 154*  < > 126* 113* 144* 147* 150*  BUN 30*  < > 36* 40* 43* 50* 56*  CREATININE 1.99*  < > 2.46* 2.82* 2.88* 3.26* 3.57*  CALCIUM 8.2*  < > 8.0* 7.9* 7.9* 8.1* 7.9*  MG 2.0  --   --   --  1.9 2.0 1.9  PHOS  --   --   --   --  2.4 2.9 3.0   < > = values in this interval not displayed. Estimated Creatinine Clearance: 18.9 ml/min (by C-G formula based on Cr of 3.57).   LIVER  Recent Labs Lab 09/08/13 0425  AST 38*  ALT 20  ALKPHOS 73  BILITOT 0.4  PROT 6.4  ALBUMIN 1.7*     INFECTIOUS  Recent Labs Lab 09/08/13 0425 09/09/13 0555  PROCALCITON 0.39 0.41     ENDOCRINE CBG (last 3)   Recent Labs  09/13/13 2325 09/14/13 0310 09/14/13 0806  GLUCAP 145* 136* 142*         IMAGING x48h  Dg Chest Port 1 View  09/14/2013   CLINICAL DATA:  Acute respiratory failure with hypoxia.  EXAM: PORTABLE CHEST - 1 VIEW  COMPARISON:  09/13/2013 and 09/12/2013 and 09/11/2013  FINDINGS: Endotracheal tube, central venous catheters, and NG tube all appear in good position.  There is increased consolidation/atelectasis at the left lung base. Right lung remains clear. Pulmonary vascular prominence has slightly diminished.  IMPRESSION: Slight increased  atelectasis/consolidation at the left lung base.   Electronically Signed   By: Rozetta Nunnery M.D.   On: 09/14/2013 07:45   Dg Chest Port 1 View  09/13/2013   CLINICAL DATA:  Evaluate endotracheal tube position.  EXAM: PORTABLE CHEST - 1 VIEW  COMPARISON:  Chest x-ray 09/12/2013.  FINDINGS: An endotracheal tube is in place with tip 3.6 cm above the carina. Right internal jugular porta cath with tip terminating in the distal superior vena cava. There is a left-sided internal jugular central venous catheter with tip terminating in the distal superior vena cava. Patchy multifocal interstitial and airspace disease throughout the lungs bilaterally, most confluent in the lung bases. Trace bilateral pleural effusions. Mild cardiomegaly. The patient is rotated to the right on today's exam, resulting in distortion of the mediastinal contours and reduced diagnostic sensitivity and specificity for mediastinal pathology. Atherosclerosis in the thoracic aorta.  IMPRESSION: 1. Support apparatus, as  above. 2. The appearance of the lungs is suggestive of multilobar pneumonia, most severe in the lower lobes of the lungs bilaterally. 3. Trace bilateral pleural effusions. 4. Atherosclerosis.   Electronically Signed   By: Vinnie Langton M.D.   On: 09/13/2013 05:51     Intake/Output Summary (Last 24 hours) at 09/14/13 0917 Last data filed at 09/14/13 0800  Gross per 24 hour  Intake 1302.34 ml  Output    830 ml  Net 472.34 ml     ASSESSMENT / PLAN:  PULMONARY A: Acute Hypercarbic Respiratory Failure PNA - strep antigen positive, LLL Pulmonary Edema > larger issue 3/19, 10L +  positive 3/20, + 500 3/24 but remains on pressors. P:   -continue full vent support -SBT / WUA daily, wean PSV as she is able -pulm toilette -diurese when able / BP will tolerate  CARDIOVASCULAR A:  HLD CHF - EF 55-60%, grade II diastolic dysfunction, elevated BNP; some pulm edema 3/19 AM Shock, septic - Hypotension also seems sedation related; all other sepsis markers improved or stable Note recent chemo with prednisone, cortisol with relative  adrenal insuff. A-Fib - new onset 3/20 pm with RVR, resolved 3/20 am P:  -hold home atenolol, amlodipine, lasix -transitioned from levo to neo 3/20 in setting of afib w RVR, weaning for MAP >65 -kvo IVF -cortisol wnl, 3/24 since refractory hypotension will try stress steroids. -diurese when able  RENAL Lab Results  Component Value Date   CREATININE 3.57* 09/14/2013   CREATININE 3.26* 09/13/2013   CREATININE 2.88* 09/12/2013   CREATININE 1.0 08/27/2013   CREATININE 0.8 07/22/2013   CREATININE 1.1 06/23/2013    A:   Acute on Chronic Renal Insufficiency - Cr appears to be worsening Elevated Lactate - resolved Hx Hypercalcemia due to Mult myeloma P:   -trend BMP and UOP > recheck 3/24 -kvo fluids -no diuresis -renal US 3/24  GASTROINTESTINAL A:   GERD Obesity Dysphagia - new hx prior to admit, difficulty swallowing, hx of vomiting P:   -continue TF  >> add reglan on 3/22 for high residuals (goal is 40cc/h) -will need swallow evaluation post extubation -PPI   HEMATOLOGIC A:   Multiple Myeloma Thrombocytopenia - chronic, runs ~ 130's-180's; rebounding > 68k on 3/22 Hx DVT - 2011, s/p Rx with coumadin P:  -Dr. Alen Blew called for consultation, appreciate input -trend platelets / monitor for bleeding -SCD's for DVT prophylaxis in setting of thrombocytopenia  INFECTIOUS A:   PNA - LL infiltrate, + strep antigen; sepsis markers stable (procalcitonin, WBC, fever) Bacteriuria - asymptomatic on admission P:   -abx as above -follow cultures as above  ENDOCRINE A:   Hyperglycemia  P:   -sensitive SSI -CBG's Q4  NEUROLOGIC A:   Metabolic Encephalopathy P:   -Fentanyl gtt/precedex -monitor neuro assessment   ONCOLOGY A : relapsing light chain multiple myelma sincee 2008. Onc notes 09/07/13: guarded prognosis even prior to current septic illness P Monitor  Code status: Dr Lake Bells had lengthy conversation with [name] her sister on 3/18 in person and 3/19 via phone.  She wishes her sister to be full code. Explained to them that with multi-organ failure the likelihood of surviving cardiac arrest is low.   Dr Lamonte Sakai updated [name] 3/22 by phone. Explained lack of real progress through the weekend (but also no real decline). Revisited issue of ACLS, low likelihood of survival of CPR. She has discussed this with other family, is not ready to change code status from full code at this time.   3/23 and 3/24 no family at bedside.  Richardson Landry Minor ACNP Maryanna Shape PCCM Pager [phone] till 3 pm If no answer page [phone] 09/14/2013, 9:12 AM      STAFF NOTE: I, Dr Ann Lions have personally reviewed patient's available data, including medical history, events of note, physical examination and test results as part of my evaluation. I have discussed with resident/NP and other care providers such as pharmacist, RN and RRT.  In addition,  I personally  evaluated patient and elicited key findings of  Acute resp failure, septic shock, worsening acute renal failure. Will get renal US and consult renal. Depending on their input,  possible palliative care consult.   Rest per NP/medical resident whose note is outlined above and that I agree with  The patient is critically ill with multiple organ systems failure and requires high complexity decision making for assessment and support, frequent evaluation and titration of therapies, application of advanced monitoring technologies and extensive interpretation of multiple databases.   Critical Care Time devoted to patient care services described in this note is  35  Minutes.  Dr. Brand Males, M.D., [facility].C.P Pulmonary and Critical Care Medicine Staff Physician [facility] Pager: [phone], If no answer or between  15:00h - 7:00h: call [phone]  09/14/2013 10:25 AM

## 2013-09-14 NOTE — Plan of Care (Signed)
Problem: Consults Goal: Nutrition Consult-if indicated Outcome: Progressing Pt is currently on Vital High Protein at 40 mL/hr (goal)

## 2013-09-15 ENCOUNTER — Inpatient Hospital Stay (HOSPITAL_COMMUNITY): Payer: Medicaid Other

## 2013-09-15 DIAGNOSIS — N179 Acute kidney failure, unspecified: Secondary | ICD-10-CM | POA: Diagnosis not present

## 2013-09-15 DIAGNOSIS — J189 Pneumonia, unspecified organism: Secondary | ICD-10-CM | POA: Diagnosis not present

## 2013-09-15 DIAGNOSIS — A419 Sepsis, unspecified organism: Secondary | ICD-10-CM | POA: Diagnosis not present

## 2013-09-15 DIAGNOSIS — C9000 Multiple myeloma not having achieved remission: Secondary | ICD-10-CM | POA: Diagnosis not present

## 2013-09-15 LAB — URINE MICROSCOPIC-ADD ON

## 2013-09-15 LAB — URINALYSIS, ROUTINE W REFLEX MICROSCOPIC
BILIRUBIN URINE: NEGATIVE
Glucose, UA: NEGATIVE mg/dL
Ketones, ur: NEGATIVE mg/dL
NITRITE: NEGATIVE
PROTEIN: 100 mg/dL — AB
SPECIFIC GRAVITY, URINE: 1.023 (ref 1.005–1.030)
Urobilinogen, UA: 0.2 mg/dL (ref 0.0–1.0)
pH: 5.5 (ref 5.0–8.0)

## 2013-09-15 LAB — GLUCOSE, CAPILLARY
GLUCOSE-CAPILLARY: 156 mg/dL — AB (ref 70–99)
Glucose-Capillary: 147 mg/dL — ABNORMAL HIGH (ref 70–99)
Glucose-Capillary: 155 mg/dL — ABNORMAL HIGH (ref 70–99)
Glucose-Capillary: 154 mg/dL — ABNORMAL HIGH (ref 70–99)
Glucose-Capillary: 141 mg/dL — ABNORMAL HIGH (ref 70–99)
Glucose-Capillary: 137 mg/dL — ABNORMAL HIGH (ref 70–99)
Glucose-Capillary: 137 mg/dL — ABNORMAL HIGH (ref 70–99)

## 2013-09-15 LAB — CBC
HEMATOCRIT: 29.6 % — AB (ref 36.0–46.0)
HEMOGLOBIN: 10 g/dL — AB (ref 12.0–15.0)
MCH: 28.7 pg (ref 26.0–34.0)
MCHC: 33.8 g/dL (ref 30.0–36.0)
MCV: 85.1 fL (ref 78.0–100.0)
Platelets: 80 10*3/uL — ABNORMAL LOW (ref 150–400)
RBC: 3.48 MIL/uL — ABNORMAL LOW (ref 3.87–5.11)
RDW: 16.4 % — AB (ref 11.5–15.5)
WBC: 4.9 10*3/uL (ref 4.0–10.5)

## 2013-09-15 LAB — BASIC METABOLIC PANEL
BUN: 63 mg/dL — AB (ref 6–23)
CHLORIDE: 114 meq/L — AB (ref 96–112)
CO2: 17 mEq/L — ABNORMAL LOW (ref 19–32)
CREATININE: 3.72 mg/dL — AB (ref 0.50–1.10)
Calcium: 7.8 mg/dL — ABNORMAL LOW (ref 8.4–10.5)
GFR, EST AFRICAN AMERICAN: 14 mL/min — AB (ref >90)
GFR, EST NON AFRICAN AMERICAN: 12 mL/min — AB (ref >90)
GLUCOSE: 173 mg/dL — AB (ref 70–99)
POTASSIUM: 3.8 meq/L (ref 3.7–5.3)
Sodium: 146 mEq/L (ref 137–147)

## 2013-09-15 LAB — RENAL FUNCTION PANEL
ALBUMIN: 1.5 g/dL — AB (ref 3.5–5.2)
BUN: 62 mg/dL — ABNORMAL HIGH (ref 6–23)
CALCIUM: 7.6 mg/dL — AB (ref 8.4–10.5)
CO2: 19 mEq/L (ref 19–32)
Chloride: 114 mEq/L — ABNORMAL HIGH (ref 96–112)
Creatinine, Ser: 3.41 mg/dL — ABNORMAL HIGH (ref 0.50–1.10)
GFR calc non Af Amer: 13 mL/min — ABNORMAL LOW (ref >90)
GFR, EST AFRICAN AMERICAN: 15 mL/min — AB (ref >90)
GLUCOSE: 173 mg/dL — AB (ref 70–99)
PHOSPHORUS: 3.2 mg/dL (ref 2.3–4.6)
Potassium: 3.4 mEq/L — ABNORMAL LOW (ref 3.7–5.3)
SODIUM: 145 meq/L (ref 137–147)

## 2013-09-15 LAB — SODIUM, URINE, RANDOM: Sodium, Ur: 30 mEq/L

## 2013-09-15 LAB — CREATININE, URINE, RANDOM: CREATININE, URINE: 65.61 mg/dL

## 2013-09-15 MED ORDER — SODIUM CHLORIDE 0.9 % FOR CRRT: INTRAVENOUS_CENTRAL | Status: DC | PRN

## 2013-09-15 MED ORDER — PRISMASOL BGK 4/2.5 32-4-2.5 MEQ/L IV SOLN
INTRAVENOUS | Status: DC
  Administered 2013-09-15 – 2013-09-18 (×4): via INTRAVENOUS_CENTRAL
  Filled 2013-09-15 (×7): qty 5000

## 2013-09-15 MED ORDER — METOCLOPRAMIDE HCL 5 MG/ML IJ SOLN
5.0000 mg | Freq: Three times a day (TID) | INTRAMUSCULAR | Status: DC
  Administered 2013-09-15 – 2013-09-16 (×3): 5 mg via INTRAVENOUS
  Filled 2013-09-15 (×6): qty 1

## 2013-09-15 MED ORDER — PRO-STAT SUGAR FREE PO LIQD
30.0000 mL | Freq: Three times a day (TID) | ORAL | Status: DC
  Administered 2013-09-15 – 2013-09-16 (×3): 30 mL
  Filled 2013-09-15 (×5): qty 30

## 2013-09-15 MED ORDER — ALTEPLASE 2 MG IJ SOLR
2.0000 mg | Freq: Once | INTRAMUSCULAR | Status: AC | PRN
  Administered 2013-09-15: 2 mg
  Filled 2013-09-15 (×2): qty 2

## 2013-09-15 MED ORDER — HEPARIN SODIUM (PORCINE) 1000 UNIT/ML IJ SOLN
4000.0000 [IU] | Freq: Once | INTRAMUSCULAR | Status: AC
  Administered 2013-09-15: 4000 [IU] via INTRAVENOUS
  Filled 2013-09-15 (×2): qty 4

## 2013-09-15 MED ORDER — PRISMASOL BGK 4/2.5 32-4-2.5 MEQ/L IV SOLN
INTRAVENOUS | Status: DC
  Administered 2013-09-15 – 2013-09-19 (×4): via INTRAVENOUS_CENTRAL
  Filled 2013-09-15 (×11): qty 5000

## 2013-09-15 MED ORDER — VITAL HIGH PROTEIN PO LIQD
1000.0000 mL | ORAL | Status: DC
  Administered 2013-09-15 (×2): 1000 mL
  Filled 2013-09-15 (×3): qty 1000

## 2013-09-15 MED ORDER — PRISMASOL BGK 4/2.5 32-4-2.5 MEQ/L IV SOLN
INTRAVENOUS | Status: DC
  Administered 2013-09-15 – 2013-09-19 (×27): via INTRAVENOUS_CENTRAL
  Filled 2013-09-15 (×41): qty 5000

## 2013-09-15 MED ORDER — FENTANYL CITRATE 0.05 MG/ML IJ SOLN
25.0000 ug | INTRAMUSCULAR | Status: DC | PRN
  Administered 2013-09-15 – 2013-09-16 (×3): 50 ug via INTRAVENOUS
  Filled 2013-09-15 (×3): qty 2

## 2013-09-15 MED ORDER — HEPARIN SODIUM (PORCINE) 1000 UNIT/ML DIALYSIS
1000.0000 [IU] | INTRAMUSCULAR | Status: DC | PRN
  Filled 2013-09-15: qty 6

## 2013-09-15 NOTE — Progress Notes (Signed)
  [facility] Progress Note   Subjective: transferred to [facility] ICU, no change in pressors, BP 115/61, CVP 8 today. Renal US echogenic kidneys, no hydro, nl size  Filed Vitals:   09/15/13 0700 09/15/13 0737 09/15/13 0800 09/15/13 0820  BP: 122/71 124/68 115/61   Pulse: 67 86 78   Temp:   98.4 F (36.9 C)   TempSrc:    Oral  Resp: _0 Height:      Weight:      SpO2: 100% 100% 100%    Exam: On vent, opens eyes, does not follow commands +JVD  Chest occ rhonchi, no wheezing RRR no MRG  Abd obese, nondistended  Ext moderate 2+ diffuse LE edema  Neuro moves all extremities, gen weakness  UA 1.023, 5.5, 0-2wbc, 3-6rbc, few bact UNa 30, UCr 65  FeNa 1.1% Renal US - 11 cm bilat, no hydro, ^echo ECHO mod LVH  EF 55%  DD2. RV mildly dilated w nl function   Assessment:  1 Acute renal failure- ATN from refractory shock on pressors, nonoliguric 2 Vol overload- prob pulm edema as well, up 17kg from admit  3 PNA / VDRF- on rocephin  4 Multiple myeloma  5 Septic shock on pressors  6 Anemia Hb 9.7  7 DM on insulin SSI  8 Nutrition on TF's, reglan IV   Plan-  CRRT to start today, pull volume as BP tolerates, repeat CXR   Kelly Splinter MD  pager [phone]    cell [phone]  09/15/2013, 9:26 AM     Recent Labs Lab 09/12/13 0540 09/13/13 0214 09/14/13 0516 09/15/13 0500  NA 142 143 146 146  K 3.8 3.7 3.5* 3.8  CL 112 111 115* 114*  CO2 19 18* 19 17*  GLUCOSE 144* 147* 150* 173*  BUN 43* 50* 56* 63*  CREATININE 2.88* 3.26* 3.57* 3.72*  CALCIUM 7.9* 8.1* 7.9* 7.8*  PHOS 2.4 2.9 3.0  --    No results found for this basename: AST, ALT, ALKPHOS, BILITOT, PROT, ALBUMIN,  in the last 168 hours  Recent Labs Lab 09/10/13 0603  09/13/13 0214 09/14/13 0516 09/15/13 0500  WBC 6.7  < > 6.9 4.2 4.9  NEUTROABS 5.5  --   --   --   --   HGB 10.8*  < > 10.7* 9.7* 10.0*  HCT 32.8*  < > 31.8* 28.1* 29.6*  MCV 86.5  < > 85.7 83.9 85.1  PLT 47*  < > 69* 68* 80*   < > = values in this interval not displayed. Marland Kitchen antiseptic oral rinse  15 mL Mouth Rinse QID  . cefTRIAXone (ROCEPHIN)  IV  2 g Intravenous Q24H  . chlorhexidine  15 mL Mouth Rinse BID  . feeding supplement (PRO-STAT SUGAR FREE 64)  30 mL Per Tube BID  . feeding supplement (VITAL HIGH PROTEIN)  1,000 mL Per Tube Q24H  . hydrocortisone sod succinate (SOLU-CORTEF) inj  50 mg Intravenous Q6H  . insulin aspart  0-9 Units Subcutaneous 6 times per day  . magic mouthwash w/lidocaine  5 mL Oral QID  . metoCLOPramide (REGLAN) injection  5 mg Intravenous 3 times per day  . pantoprazole sodium  40 mg Per Tube Daily   . sodium chloride 40 mL/hr at 09/15/13 0100  . dexmedetomidine 0.2 mcg/kg/hr (09/15/13 0817)  . phenylephrine (NEO-SYNEPHRINE) Adult infusion 20 mcg/min (09/15/13 0816)   albuterol, fentaNYL, lidocaine-prilocaine, sodium chloride

## 2013-09-15 NOTE — Progress Notes (Signed)
PULMONARY / CRITICAL CARE MEDICINE   Name: [name] MRN: [id number] DOB: 1950-08-26    ADMISSION DATE:  09/01/2013 CONSULTATION DATE:  09/07/13  REFERRING MD :  Dr. Maryland Pink PRIMARY SERVICE: TRH-->PCCM  CHIEF COMPLAINT:  Acute Respiratory Failure  BRIEF PATIENT DESCRIPTION: 63 y/o F admitted with 3 wk hx of weakness, decreased appetite & difficulty swallowing.  Found to have positive strep antigen with concern for L retrocardiac infiltrate.  Progressed to hypercarbic respiratory failure, PCCM consulted.  SIGNIFICANT EVENTS / STUDIES:  3/16 Admit with 3 wk hx of weakness, decreased appetite & difficulty swallowing.  Found to have positive strep antigen with concern for L retrocardiac infiltrate.  3/17 Hypercarbic respiratory failure, intubated early am 3/17 ECHO:  mod LVH, nml systolic fxn, EF 81-44%, nml wall motion, grade II diastolic dysfunction  8/18  remains on vent, levophed & fentanyl gtt 3/20 afib with RVR overnight 3/23 failed SBT  3/24 failed SBT 3/24 renal US: Echogenic kidneys suggesting chronic medical renal disease. 3/24 Transferred to [facility] for CRRT 3/25 CRRT initiated  LINES / TUBES: ETT 3/17 >>  L IJ TLC 3/17 >>  Port-A-Cath 3/17 >>  R femoral HD cath 3/24 >>   CULTURES: BCx2 3/16>>>neg RVP 3/16>>>neg Sputum 3/16>>>nml flora UC 3/16>>>neg UA 3/16>>>3-6 wbc, few bacteria.  Asymptomatic on admit. Strep AG 3/16>>>positive  ANTIBIOTICS: Azithro 3/16>>>3/17 Cefepime 3/16>>>3/17 Rocephin 3/16>> 3/25  SUBJECTIVE:   RASS -3. Not F/C. NAD  VITAL SIGNS: Temp:  [98.2 F (36.8 C)-99.5 F (37.5 C)] 99.5 F (37.5 C) (03/25 1237) Pulse Rate:  [29-93] 93 (03/25 1445) Resp:  [0-29] 23 (03/25 1445) BP: (91-140)/(35-80) 99/51 mmHg (03/25 1445) SpO2:  [93 %-100 %] 97 % (03/25 1445) FiO2 (%):  [30 %] 30 % (03/25 1222) Weight:  [106.4 kg (234 lb 9.1 oz)] 106.4 kg (234 lb 9.1 oz) (03/25 0146)  HEMODYNAMICS: CVP:  [5 mmHg-13 mmHg] 5 mmHg  VENTILATOR  SETTINGS: Vent Mode:  [-] PRVC FiO2 (%):  [30 %] 30 % Set Rate:  [20 bmp] 20 bmp Vt Set:  [440 mL] 440 mL PEEP:  [5 cmH20] 5 cmH20 Plateau Pressure:  [23 cmH20-27 cmH20] 27 cmH20  INTAKE / OUTPUT: Intake/Output     03/24 0701 - 03/25 0700 03/25 0701 - 03/26 0700   I.V. (mL/kg) 1263.4 (11.9) 341.2 (3.2)   NG/GT 920 280   IV Piggyback 50 50   Total Intake(mL/kg) 2233.4 (21) 671.2 (6.3)   Urine (mL/kg/hr) 472 (0.2) 185 (0.2)   Stool 350 (0.1)    Total Output 822 185   Net +1411.4 +486.2          PHYSICAL EXAMINATION: Gen: RASS -3. Not F/C. NAD Neuro: no focal deficits HEENT: WNL PULM: clear anteriorly CV: RRR, soft systolic murmur @ LSB AB: BS+, soft, nontender, +BS Ext: warm, trace edema Derm: no rash or skin breakdown    LABS: I have reviewed all of today's lab results. Relevant abnormalities are discussed in the A/P section  CXR: diffuse IS prominence. LLL atx/eff  ASSESSMENT / PLAN:  PULMONARY A: Acute Respiratory Failure LLL PNA - strep Ag positive Pulmonary edema. P:   Cont full vent support - settings reviewed and/or adjusted Cont vent bundle Daily SBT if/when meets criteria   CARDIOVASCULAR A:  HLD CHF Shock, septic, resolving AFRVR, resolved P:  Monitor rhythm and BP Wean vasopressors to off for MAP > 65 mmHg  RENAL A:   Acute on Chronic Renal Insufficiency Lactic acidosis, resolved Hx Hypercalcemia due to MM -  inactive problem P:   Monitor BMET intermittently Monitor I/Os Correct electrolytes as indicated   GASTROINTESTINAL A:   GERD Obesity Dysphagia P:   SUP: enteral pantoprazole Cont TFs per protocol  HEMATOLOGIC A:   Multiple Myeloma, relapsed Thrombocytopenia Hx DVT - 2011 P:  DVT px: SCDs Monitor CBC intermittently Transfuse per usual ICU guidelines Holding full anticoagulation  INFECTIOUS A:   LLL PNA  Bacteriuria, asymptomatic P:   Micro and abx as above  ENDOCRINE A:   DM 2, hyperglycemia  P:    Cont SSI  NEUROLOGIC A:   Metabolic Encephalopathy P:   DC dex gtt 3/25 Try to manage with intermittent sedation/analgesia    Code status: FULL  Attempted to contact sister, [name], by phone. No answer and no option to leave VM  40 mins CCM time   Merton Border, MD ; [facility] [phone].  After 5:30 PM or weekends, call [phone]

## 2013-09-15 NOTE — Progress Notes (Signed)
Called patients sister [name] and updated on patients transfer to [facility]114 and the placement of hemodialysis catheter.  [name] was in agreement of HD catheter & HD.  Continued to have ongoing discussions about appropriateness of aggressive measures.  Unfortunately, [name] has unrealistic expectations regarding patients prognosis and current trajectory of illness.    [name], NP-C [facility] Pgr: [phone] or [phone]  Merton Border, MD ; [facility] service Mobile [phone].  After 5:30 PM or weekends, call [phone]

## 2013-09-15 NOTE — Progress Notes (Signed)
No SBT/wean done due to pt currently on pressors. No complications noted. RT will monitor.

## 2013-09-15 NOTE — Procedures (Signed)
Hemodialysis Catheter Insertion Procedure Note [name] [id number] [birth year]  Procedure: Insertion of Hemodialysis Catheter Indications: Hemodialysis  Procedure Details Consent: Unable to obtain consent because of altered level of consciousness., no family available.   Time Out: Verified patient identification, verified procedure, site/side was marked, verified correct patient position, special equipment/implants available, medications/allergies/relevent history reviewed, required imaging and test results available.  Performed  Maximum sterile technique was used including antiseptics, cap, gloves, gown, hand hygiene, mask and sheet. Skin prep: Chlorhexidine; local anesthetic administered A triple lumen HD catheter was placed in the right femoral vein due to no other access available using the Seldinger technique.  Evaluation Blood flow good Complications: No apparent complications Patient did tolerate procedure well.   Procedure performed with ultrasound guidance for real time vessel cannulation.      Sara Gens, NP-C [facility] Pgr: [phone] or [phone]    09/15/2013, 1:22 AM  Merton Border, MD ; [facility] [phone].  After 5:30 PM or weekends, call [phone]

## 2013-09-15 NOTE — Progress Notes (Signed)
NUTRITION FOLLOW UP  Intervention:    Continue TF; Utilize 11M PEPuP Protocol: Vital High Protein via OGT at 45 ml/hr (1080 ml per day) with Prostat 73ml TID to provide 1380 calories (25 kcal/kg ideal body weight), 140g protein, 903 ml free water daily.  Nutrition Dx:   Inadequate oral intake related to inability to eat as evidenced by NPO status - ongoing.  Goal:   Enteral nutrition to provide 60-70% of estimated calorie needs (22-25 kcals/kg ideal body weight) and 100% of estimated protein needs, based on ASPEN guidelines for permissive underfeeding in critically ill obese individuals. Met.   Monitor:   Weights, labs, TF tolerance, vent status   Assessment:   Patient with a 3 week hx of weakness, decreased appetite and difficulty swallowing. Found to have positive strep antigen with concern for L retrocardiac infiltrate. Requiring intubation today due to hypercarbic respiratory failure. PNA and metabolic encephalopathy. Hx of light chain multiple myeloma and received Kyprolis receiving last therapy on 06/2013 and Zometa monthly.   Per weight hx, patient with a 22% weight loss in the past 9 months.  Patient was transferred to MICU at [facility] last night for CRRT (starting today).   Patient remains intubated on ventilator support.  MV: 11.1 L/min Temp (24hrs), Avg:99 F (37.2 C), Min:98.2 F (36.8 C), Max:100 F (37.8 C)   TF: Vital High Protein at 39ml/hr with Prostat 30 ml BID which provides 1160 calories and 84g protein per day.  Height: Ht Readings from Last 1 Encounters:  09/07/13 $RemoveB'5\' 4"'[id number]$  (1.626 m)    Weight Status:   Wt Readings from Last 1 Encounters:  09/15/13 234 lb 9.1 oz (106.4 kg)  Admit wt         193 lb 9 oz (87.8 kg)   Re-estimated needs:  Kcal: 1425 Protein: >130g Fluid: >1.3L/day  Skin: open wound on sacrum  Diet Order:  NPO   Intake/Output Summary (Last 24 hours) at 09/15/13 1152 Last data filed at 09/15/13 1000  Gross per 24 hour  Intake 2111.83 ml   Output    682 ml  Net 1429.83 ml    Last BM: 3/25   Labs:   Recent Labs Lab 09/12/13 0540 09/13/13 0214 09/14/13 0516 09/15/13 0500  NA 142 143 146 146  K 3.8 3.7 3.5* 3.8  CL 112 111 115* 114*  CO2 19 18* 19 17*  BUN 43* 50* 56* 63*  CREATININE 2.88* 3.26* 3.57* 3.72*  CALCIUM 7.9* 8.1* 7.9* 7.8*  MG 1.9 2.0 1.9  --   PHOS 2.4 2.9 3.0  --   GLUCOSE 144* 147* 150* 173*    CBG (last 3)   Recent Labs  09/15/13 0043 09/15/13 0424 09/15/13 0752  GLUCAP 156* 155* 154*    Scheduled Meds: . antiseptic oral rinse  15 mL Mouth Rinse QID  . cefTRIAXone (ROCEPHIN)  IV  2 g Intravenous Q24H  . chlorhexidine  15 mL Mouth Rinse BID  . feeding supplement (PRO-STAT SUGAR FREE 64)  30 mL Per Tube BID  . feeding supplement (VITAL HIGH PROTEIN)  1,000 mL Per Tube Q24H  . hydrocortisone sod succinate (SOLU-CORTEF) inj  50 mg Intravenous Q6H  . insulin aspart  0-9 Units Subcutaneous 6 times per day  . magic mouthwash w/lidocaine  5 mL Oral QID  . metoCLOPramide (REGLAN) injection  5 mg Intravenous 3 times per day  . pantoprazole sodium  40 mg Per Tube Daily    Continuous Infusions: . sodium chloride 40 mL/hr at  09/15/13 0100  . phenylephrine (NEO-SYNEPHRINE) Adult infusion 20 mcg/min (09/15/13 0816)  . dialysis replacement fluid (prismasate)    . dialysis replacement fluid (prismasate)    . dialysate (PRISMASATE)       Molli Barrows, RD, LDN, [place] Pager [phone] After Hours Pager [phone]

## 2013-09-16 ENCOUNTER — Inpatient Hospital Stay (HOSPITAL_COMMUNITY): Payer: Medicaid Other

## 2013-09-16 DIAGNOSIS — G9340 Encephalopathy, unspecified: Secondary | ICD-10-CM

## 2013-09-16 LAB — POCT I-STAT 3, ART BLOOD GAS (G3+)
Acid-base deficit: 5 mmol/L — ABNORMAL HIGH (ref 0.0–2.0)
Bicarbonate: 25.2 mEq/L — ABNORMAL HIGH (ref 20.0–24.0)
O2 SAT: 95 %
Patient temperature: 97.7
TCO2: 28 mmol/L (ref 0–100)
pCO2 arterial: 77.2 mmHg (ref 35.0–45.0)
pH, Arterial: 7.118 — CL (ref 7.350–7.450)
pO2, Arterial: 102 mmHg — ABNORMAL HIGH (ref 80.0–100.0)

## 2013-09-16 LAB — GLUCOSE, CAPILLARY
GLUCOSE-CAPILLARY: 132 mg/dL — AB (ref 70–99)
Glucose-Capillary: 116 mg/dL — ABNORMAL HIGH (ref 70–99)
Glucose-Capillary: 120 mg/dL — ABNORMAL HIGH (ref 70–99)
Glucose-Capillary: 142 mg/dL — ABNORMAL HIGH (ref 70–99)
Glucose-Capillary: 160 mg/dL — ABNORMAL HIGH (ref 70–99)
Glucose-Capillary: 190 mg/dL — ABNORMAL HIGH (ref 70–99)

## 2013-09-16 LAB — CBC
HEMATOCRIT: 30.6 % — AB (ref 36.0–46.0)
HEMOGLOBIN: 10.5 g/dL — AB (ref 12.0–15.0)
MCH: 28.8 pg (ref 26.0–34.0)
MCHC: 34.3 g/dL (ref 30.0–36.0)
MCV: 83.8 fL (ref 78.0–100.0)
Platelets: 79 10*3/uL — ABNORMAL LOW (ref 150–400)
RBC: 3.65 MIL/uL — AB (ref 3.87–5.11)
RDW: 16.2 % — ABNORMAL HIGH (ref 11.5–15.5)
WBC: 7.4 10*3/uL (ref 4.0–10.5)

## 2013-09-16 LAB — RENAL FUNCTION PANEL
ALBUMIN: 1.8 g/dL — AB (ref 3.5–5.2)
Albumin: 1.6 g/dL — ABNORMAL LOW (ref 3.5–5.2)
BUN: 51 mg/dL — AB (ref 6–23)
BUN: 36 mg/dL — ABNORMAL HIGH (ref 6–23)
CO2: 19 meq/L (ref 19–32)
CO2: 24 meq/L (ref 19–32)
CREATININE: 2.76 mg/dL — AB (ref 0.50–1.10)
Calcium: 7.5 mg/dL — ABNORMAL LOW (ref 8.4–10.5)
Calcium: 7.5 mg/dL — ABNORMAL LOW (ref 8.4–10.5)
Chloride: 110 mEq/L (ref 96–112)
Chloride: 105 mEq/L (ref 96–112)
Creatinine, Ser: 1.89 mg/dL — ABNORMAL HIGH (ref 0.50–1.10)
GFR calc Af Amer: 20 mL/min — ABNORMAL LOW (ref >90)
GFR calc Af Amer: 32 mL/min — ABNORMAL LOW (ref >90)
GFR calc non Af Amer: 17 mL/min — ABNORMAL LOW (ref >90)
GFR calc non Af Amer: 27 mL/min — ABNORMAL LOW (ref >90)
GLUCOSE: 143 mg/dL — AB (ref 70–99)
GLUCOSE: 179 mg/dL — AB (ref 70–99)
POTASSIUM: 3.2 meq/L — AB (ref 3.7–5.3)
POTASSIUM: 3.9 meq/L (ref 3.7–5.3)
Phosphorus: 2.7 mg/dL (ref 2.3–4.6)
Phosphorus: 4.2 mg/dL (ref 2.3–4.6)
SODIUM: 138 meq/L (ref 137–147)
Sodium: 143 mEq/L (ref 137–147)

## 2013-09-16 LAB — BLOOD GAS, ARTERIAL
Acid-base deficit: 3.8 mmol/L — ABNORMAL HIGH (ref 0.0–2.0)
BICARBONATE: 21.6 meq/L (ref 20.0–24.0)
Drawn by: 24513
FIO2: 30 %
MECHVT: 440 mL
O2 Saturation: 97.7 %
PCO2 ART: 41.3 mmHg (ref 35.0–45.0)
PEEP/CPAP: 5 cmH2O
PO2 ART: 88.6 mmHg (ref 80.0–100.0)
Patient temperature: 95
RATE: 20 resp/min
TCO2: 23 mmol/L (ref 0–100)
pH, Arterial: 7.326 — ABNORMAL LOW (ref 7.350–7.450)

## 2013-09-16 LAB — MAGNESIUM: Magnesium: 2.1 mg/dL (ref 1.5–2.5)

## 2013-09-16 LAB — APTT: aPTT: 33 seconds (ref 24–37)

## 2013-09-16 MED ORDER — FENTANYL CITRATE 0.05 MG/ML IJ SOLN
INTRAMUSCULAR | Status: AC
  Administered 2013-09-16: 50 ug
  Filled 2013-09-16: qty 2

## 2013-09-16 MED ORDER — NALOXONE HCL 0.4 MG/ML IJ SOLN: 0.4000 mg | Freq: Once | INTRAMUSCULAR | Status: AC

## 2013-09-16 MED ORDER — NALOXONE HCL 0.4 MG/ML IJ SOLN
INTRAMUSCULAR | Status: AC
  Administered 2013-09-16: 0.4 mg
  Filled 2013-09-16: qty 1

## 2013-09-16 MED ORDER — FENTANYL CITRATE 0.05 MG/ML IJ SOLN
25.0000 ug | INTRAMUSCULAR | Status: DC | PRN
  Administered 2013-09-16: 50 ug via INTRAVENOUS

## 2013-09-16 MED ORDER — MIDAZOLAM HCL 2 MG/2ML IJ SOLN
INTRAMUSCULAR | Status: AC
  Administered 2013-09-16: 2 mg
  Filled 2013-09-16: qty 4

## 2013-09-16 MED ORDER — FENTANYL CITRATE 0.05 MG/ML IJ SOLN: 25.0000 ug | INTRAMUSCULAR | Status: DC | PRN

## 2013-09-16 MED ORDER — FENTANYL BOLUS VIA INFUSION
50.0000 ug | INTRAVENOUS | Status: DC | PRN
  Filled 2013-09-16: qty 100

## 2013-09-16 MED ORDER — SODIUM CHLORIDE 0.9 % IV SOLN
0.0000 ug/h | INTRAVENOUS | Status: DC
  Filled 2013-09-16: qty 50

## 2013-09-16 MED ORDER — HEPARIN SODIUM (PORCINE) 5000 UNIT/ML IJ SOLN
5000.0000 [IU] | Freq: Three times a day (TID) | INTRAMUSCULAR | Status: DC
  Administered 2013-09-16 – 2013-09-22 (×18): 5000 [IU] via SUBCUTANEOUS
  Filled 2013-09-16 (×21): qty 1

## 2013-09-16 MED ORDER — FENTANYL CITRATE 0.05 MG/ML IJ SOLN
INTRAMUSCULAR | Status: AC
  Administered 2013-09-16: 100 ug
  Filled 2013-09-16: qty 4

## 2013-09-16 MED ORDER — METOPROLOL TARTRATE 1 MG/ML IV SOLN
2.5000 mg | Freq: Four times a day (QID) | INTRAVENOUS | Status: DC
  Administered 2013-09-16 – 2013-09-17 (×2): 2.5 mg via INTRAVENOUS
  Filled 2013-09-16 (×6): qty 5

## 2013-09-16 MED ORDER — METOPROLOL TARTRATE 1 MG/ML IV SOLN
2.5000 mg | INTRAVENOUS | Status: DC | PRN
  Administered 2013-09-18 – 2013-09-20 (×5): 2.5 mg via INTRAVENOUS
  Filled 2013-09-16 (×4): qty 5

## 2013-09-16 MED ORDER — HYDROCORTISONE NA SUCCINATE PF 100 MG IJ SOLR
50.0000 mg | Freq: Three times a day (TID) | INTRAMUSCULAR | Status: DC
  Administered 2013-09-16 – 2013-09-17 (×3): 50 mg via INTRAVENOUS
  Administered 2013-09-17: 60 mg via INTRAVENOUS
  Administered 2013-09-18 – 2013-09-21 (×9): 50 mg via INTRAVENOUS
  Filled 2013-09-16 (×17): qty 1

## 2013-09-16 MED ORDER — ETOMIDATE 2 MG/ML IV SOLN
INTRAVENOUS | Status: AC
  Administered 2013-09-16: 10 mg
  Filled 2013-09-16: qty 10

## 2013-09-16 NOTE — Procedures (Signed)
Extubation Procedure Note  Patient Details:   Name: [name] DOB: 25-Dec-1950 MRN: [id number]   Airway Documentation:     Evaluation  O2 sats: stable throughout Complications: No apparent complications Patient did tolerate procedure well. Bilateral Breath Sounds: Diminished Suctioning: Airway Yes  Mcneil Sober 09/16/2013, 10:42 AM

## 2013-09-16 NOTE — Progress Notes (Signed)
PULMONARY / CRITICAL CARE MEDICINE   Name: [name] MRN: [id number] DOB: 07-11-1950    ADMISSION DATE:  09/03/2013 CONSULTATION DATE:  09/07/13  REFERRING MD :  Dr. [place] Pink PRIMARY SERVICE: [facility]-->PCCM  CHIEF COMPLAINT:  Acute Respiratory Failure  BRIEF PATIENT DESCRIPTION: 63 y/o F admitted with 3 wk hx of weakness, decreased appetite & difficulty swallowing.  Found to have positive strep antigen with concern for L retrocardiac infiltrate.  Progressed to hypercarbic respiratory failure, PCCM consulted.  SIGNIFICANT EVENTS / STUDIES:  3/16 Admit with 3 wk hx of weakness, decreased appetite & difficulty swallowing.  Found to have positive strep antigen with concern for L retrocardiac infiltrate.  3/17 Hypercarbic respiratory failure, intubated early am 3/17 ECHO:  mod LVH, nml systolic fxn, EF 47-42%, nml wall motion, grade II diastolic dysfunction  5/95  remains on vent, levophed & fentanyl gtt 3/20 afib with RVR overnight 3/23 failed SBT  3/24 failed SBT 3/24 renal US: Echogenic kidneys suggesting chronic medical renal disease. 3/24 Transferred to [facility] for CRRT 3/25 CRRT initiated 3/26 passed SBT and F/C. Extubated. No distress on recheck but very lethargic. Partially improved after naloxone  LINES / TUBES: ETT 3/17 >> 3/26 L IJ TLC 3/17 >>  Port-A-Cath 02/27/10 >>  R femoral HD cath 3/24 >>   CULTURES: BCx2 3/16>>>neg RVP 3/16>>>neg Sputum 3/16>>>nml flora UC 3/16>>>neg UA 3/16>>>3-6 wbc, few bacteria.  Asymptomatic on admit. Strep AG 3/16>>>positive  ANTIBIOTICS: Azithro 3/16>>>3/17 Cefepime 3/16>>>3/17 Rocephin 3/16>> 3/25  SUBJECTIVE:   Passed SBT. + F/C. Extubated  VITAL SIGNS: Temp:  [97 F (36.1 C)-98.7 F (37.1 C)] 97 F (36.1 C) (03/26 1225) Pulse Rate:  [85-127] 127 (03/26 0758) Resp:  [16-33] 20 (03/26 1500) BP: (85-141)/(31-92) 85/35 mmHg (03/26 1500) SpO2:  [97 %-100 %] 98 % (03/26 1043) FiO2 (%):  [30 %] 30 % (03/26 0800) Weight:   [106.1 kg (233 lb 14.5 oz)] 106.1 kg (233 lb 14.5 oz) (03/26 0500)  HEMODYNAMICS: CVP:  [8 mmHg-14 mmHg] 9 mmHg  VENTILATOR SETTINGS: Vent Mode:  [-] CPAP;PSV FiO2 (%):  [30 %] 30 % Set Rate:  [20 bmp] 20 bmp Vt Set:  [440 mL] 440 mL PEEP:  [5 cmH20] 5 cmH20 Pressure Support:  [5 cmH20] 5 cmH20 Plateau Pressure:  [22 cmH20-26 cmH20] 26 cmH20  INTAKE / OUTPUT: Intake/Output     03/25 0701 - 03/26 0700 03/26 0701 - 03/27 0700   I.V. (mL/kg) 1224.5 (11.5) 160 (1.5)   Other 20    NG/GT 1053.3 135   IV Piggyback 50    Total Intake(mL/kg) 2347.8 (22.1) 295 (2.8)   Urine (mL/kg/hr) 340 (0.1) 117 (0.1)   Other 1881 (0.7) 1443 (1.4)   Stool 500 (0.2) 100 (0.1)   Total Output 2721 1660   Net -373.2 -1365          PHYSICAL EXAMINATION: Gen: RASS -3. Not F/C. NAD Neuro: no focal deficits HEENT: WNL PULM: clear anteriorly CV: RRR, soft systolic murmur @ LSB AB: BS+, soft, nontender, +BS Ext: warm, trace edema Derm: no rash or skin breakdown    LABS: I have reviewed all of today's lab results. Relevant abnormalities are discussed in the A/P section  CXR: diffuse IS prominence. LLL atx/eff  ASSESSMENT / PLAN:  PULMONARY A: Acute Respiratory Failure LLL PNA - strep Ag positive Pulmonary edema. P:   Monitor closely in ICU post extubation Supplemental O2 to maintain SpO2 > 90 % PRN NPPV ordered PRN NTS ordered  CARDIOVASCULAR A:  HLD  CHF Shock, septic, resolved AFRVR, resolved P:  Monitor rhythm and BP  RENAL A:   Acute on Chronic Renal Insufficiency Lactic acidosis, resolved Hx Hypercalcemia due to MM - inactive problem P:   Monitor BMET intermittently Monitor I/Os Correct electrolytes as indicated CRRT per Renal service  GASTROINTESTINAL A:   GERD Obesity Dysphagia P:   SUP: N/I post extubation NPO post extubation  HEMATOLOGIC A:   Multiple Myeloma, relapsed Mild Thrombocytopenia Hx DVT - 2011 P:  DVT px: SQ hep and SCDs Monitor CBC  intermittently Transfuse per usual ICU guidelines Holding full anticoagulation  INFECTIOUS A:   Pneumococcal PNA  Bacteriuria, asymptomatic P:   Micro and abx as above  ENDOCRINE A:   DM 2, hyperglycemia  P:   Cont SSI  NEUROLOGIC A:   Toxic - metabolic Encephalopathy P:   D/C all sedative/analgesics Monitor closely   Code status: FULL   40 mins CCM time   Merton Border, MD ; [facility] service Mobile [phone].  After 5:30 PM or weekends, call [phone]

## 2013-09-16 NOTE — Procedures (Signed)
Name: PACHIA STRUM MRN: [id number] DOB: 11-Jul-1950   PROCEDURE NOTE  Procedure:  Endotracheal intubation.  Indication:  Acute respiratory failure  Consent:  Consent was implied due to the emergency nature of the procedure.  Anesthesia:  Fentanyl / Versed / Etomidate  Procedure summary:  Appropriate equipment was assembled. The patient was identified as [facility] and safety timeout was performed. The patient was placed supine, with head in sniffing position. After adequate level of anesthesia was achieved, a GS#3 blade was inserted into the oropharynx and the vocal cords were visualized. A 7.5 endotracheal tube was inserted with some difficulty and visualized going through the vocal cords. The stylette was removed and cuff inflated. Colorimetric change was noted on the CO2 meter. Breath sounds were heard over both lung fields equally. ETT was secured at 23 cm lip line.  Post procedure chest xray was ordered.  Complications:  No immediate complications were noted.  Hemodynamic parameters and oxygenation remained stable throughout the procedure.    Penne Lash, M.D. [facility] Pager: [phone]  09/16/2013, 6:51 PM

## 2013-09-16 NOTE — Progress Notes (Signed)
  [facility] Progress Note   Subjective: Net negative 373 cc yest on CRRT, pressors off now, BP improved yest with fluid removal. Extubated this am.    Filed Vitals:   09/16/13 0900 09/16/13 1000 09/16/13 1043 09/16/13 1100  BP: 122/66 114/53  111/85  Pulse:      Temp:      TempSrc:      Resp: 27 25  32  Height:      Weight:      SpO2:   98%    Exam: Poorly responsive, respirations gurgling and shallow +JVD  Bilat ant rhochi/coarse rales RRR no MRG  Abd obese, nondistended  Ext moderate 2+ diffuse LE edema  Neuro opens eyes minimally to voice  UA 1.023, 5.5, 0-2wbc, 3-6rbc, few bact UNa 30, UCr 65  FeNa 1.1% Renal US - 11 cm bilat, no hydro, ^echo ECHO mod LVH  EF 55%  DD2. RV mildly dilated w nl function   Assessment:  1 Acute renal failure- ATN d/t shock, oliguric, D#2 CRRT 2 Vol overload severe / pulm edema: still up 18kg from admission  3 PNA / VDRF- on rocephin  4 Multiple myeloma  5 Shock - better, off of pressors now 6 Anemia Hb 9.7  7 DM on insulin SSI  8 Nutrition on TF's, reglan IV   Plan- increased UF with CRRT to 100-150 cc/hr, needs a lot of fluid off and this will take days. Resp status tenuous , have d/w RT and RN   Kelly Splinter MD  pager [phone]    cell [phone]  09/16/2013, 12:24 PM     Recent Labs Lab 09/14/13 0516 09/15/13 0500 09/15/13 1600 09/16/13 0325  NA 146 146 145 143  K 3.5* 3.8 3.4* 3.2*  CL 115* 114* 114* 110  CO2 19 17* 19 19  GLUCOSE 150* 173* 173* 143*  BUN 56* 63* 62* 51*  CREATININE 3.57* 3.72* 3.41* 2.76*  CALCIUM 7.9* 7.8* 7.6* 7.5*  PHOS 3.0  --  3.2 2.7    Recent Labs Lab 09/15/13 1600 09/16/13 0325  ALBUMIN 1.5* 1.6*    Recent Labs Lab 09/10/13 0603  09/14/13 0516 09/15/13 0500 09/16/13 0325  WBC 6.7  < > 4.2 4.9 7.4  NEUTROABS 5.5  --   --   --   --   HGB 10.8*  < > 9.7* 10.0* 10.5*  HCT 32.8*  < > 28.1* 29.6* 30.6*  MCV 86.5  < > 83.9 85.1 83.8  PLT 47*  < > 68* 80* 79*  < >  = values in this interval not displayed. Marland Kitchen antiseptic oral rinse  15 mL Mouth Rinse QID  . chlorhexidine  15 mL Mouth Rinse BID  . hydrocortisone sod succinate (SOLU-CORTEF) inj  50 mg Intravenous Q8H  . magic mouthwash w/lidocaine  5 mL Oral QID  . metoprolol  2.5 mg Intravenous 4 times per day   . sodium chloride 30 mL/hr (09/15/13 1600)  . phenylephrine (NEO-SYNEPHRINE) Adult infusion Stopped (09/16/13 0707)  . dialysis replacement fluid (prismasate) 400 mL/hr at 09/15/13 1345  . dialysis replacement fluid (prismasate) 200 mL/hr at 09/15/13 1344  . dialysate (PRISMASATE) 2,000 mL/hr at 09/16/13 1051   albuterol, fentaNYL, heparin, lidocaine-prilocaine, metoprolol, sodium chloride, sodium chloride

## 2013-09-16 NOTE — Progress Notes (Signed)
eLink Physician-Brief Progress Note Patient Name: [name] DOB: 02-08-1951 MRN: [id number]  Date of Service  09/16/2013   HPI/Events of Note   Needs VTE proph  eICU Interventions  Hep sq given   Intervention Category Intermediate Interventions: Best-practice therapies (e.g. DVT, beta blocker, etc.)  Asencion Noble 09/16/2013, 4:00 PM

## 2013-09-16 NOTE — Procedures (Signed)
Arterial Catheter Insertion Procedure Note [name] [id number] 06-09-1951  Procedure: Insertion of Arterial Catheter  Indications: Blood pressure monitoring and Frequent blood sampling  Procedure Details Consent: Risks of procedure as well as the alternatives and risks of each were explained to the (patient/caregiver).  Consent for procedure obtained. and Unable to obtain consent because of emergent medical necessity. Time Out: Verified patient identification, verified procedure, site/side was marked, verified correct patient position, special equipment/implants available, medications/allergies/relevent history reviewed, required imaging and test results available.  Performed  Maximum sterile technique was used including antiseptics, gloves, gown, hand hygiene, mask and sheet. Skin prep: Chlorhexidine; local anesthetic administered 22 gauge catheter was inserted into left radial artery using the Seldinger technique.  Evaluation Blood flow good; BP tracing good. Complications: No apparent complications.   Ulice Dash 09/16/2013

## 2013-09-17 ENCOUNTER — Inpatient Hospital Stay (HOSPITAL_COMMUNITY): Payer: Medicaid Other

## 2013-09-17 LAB — GLUCOSE, CAPILLARY
GLUCOSE-CAPILLARY: 61 mg/dL — AB (ref 70–99)
GLUCOSE-CAPILLARY: 99 mg/dL (ref 70–99)
Glucose-Capillary: 86 mg/dL (ref 70–99)
Glucose-Capillary: 56 mg/dL — ABNORMAL LOW (ref 70–99)
Glucose-Capillary: 54 mg/dL — ABNORMAL LOW (ref 70–99)
Glucose-Capillary: 59 mg/dL — ABNORMAL LOW (ref 70–99)
Glucose-Capillary: 49 mg/dL — ABNORMAL LOW (ref 70–99)
Glucose-Capillary: 92 mg/dL (ref 70–99)
Glucose-Capillary: 101 mg/dL — ABNORMAL HIGH (ref 70–99)

## 2013-09-17 LAB — RENAL FUNCTION PANEL
ALBUMIN: 1.7 g/dL — AB (ref 3.5–5.2)
ALBUMIN: 1.7 g/dL — AB (ref 3.5–5.2)
BUN: 29 mg/dL — ABNORMAL HIGH (ref 6–23)
BUN: 22 mg/dL (ref 6–23)
CALCIUM: 7.2 mg/dL — AB (ref 8.4–10.5)
CALCIUM: 7.2 mg/dL — AB (ref 8.4–10.5)
CHLORIDE: 104 meq/L (ref 96–112)
CO2: 21 meq/L (ref 19–32)
CO2: 23 mEq/L (ref 19–32)
CREATININE: 1.61 mg/dL — AB (ref 0.50–1.10)
Chloride: 101 mEq/L (ref 96–112)
Creatinine, Ser: 1.32 mg/dL — ABNORMAL HIGH (ref 0.50–1.10)
GFR calc Af Amer: 38 mL/min — ABNORMAL LOW (ref >90)
GFR, EST AFRICAN AMERICAN: 49 mL/min — AB (ref >90)
GFR, EST NON AFRICAN AMERICAN: 33 mL/min — AB (ref >90)
GFR, EST NON AFRICAN AMERICAN: 42 mL/min — AB (ref >90)
GLUCOSE: 114 mg/dL — AB (ref 70–99)
Glucose, Bld: 82 mg/dL (ref 70–99)
PHOSPHORUS: 2 mg/dL — AB (ref 2.3–4.6)
Phosphorus: 2.3 mg/dL (ref 2.3–4.6)
Potassium: 3.6 mEq/L — ABNORMAL LOW (ref 3.7–5.3)
Potassium: 3.9 mEq/L (ref 3.7–5.3)
SODIUM: 139 meq/L (ref 137–147)
SODIUM: 134 meq/L — AB (ref 137–147)

## 2013-09-17 LAB — CBC
HEMATOCRIT: 30.1 % — AB (ref 36.0–46.0)
Hemoglobin: 10.1 g/dL — ABNORMAL LOW (ref 12.0–15.0)
MCH: 28.7 pg (ref 26.0–34.0)
MCHC: 33.6 g/dL (ref 30.0–36.0)
MCV: 85.5 fL (ref 78.0–100.0)
PLATELETS: 74 10*3/uL — AB (ref 150–400)
RBC: 3.52 MIL/uL — ABNORMAL LOW (ref 3.87–5.11)
RDW: 16.7 % — AB (ref 11.5–15.5)
WBC: 10.1 10*3/uL (ref 4.0–10.5)

## 2013-09-17 LAB — APTT: APTT: 33 s (ref 24–37)

## 2013-09-17 LAB — MAGNESIUM: Magnesium: 2.1 mg/dL (ref 1.5–2.5)

## 2013-09-17 MED ORDER — FENTANYL CITRATE 0.05 MG/ML IJ SOLN
25.0000 ug | INTRAMUSCULAR | Status: DC | PRN
Start: 2013-09-17 — End: 2013-09-18

## 2013-09-17 MED ORDER — ALBUTEROL SULFATE (2.5 MG/3ML) 0.083% IN NEBU
2.5000 mg | INHALATION_SOLUTION | RESPIRATORY_TRACT | Status: DC | PRN
Start: 2013-09-17 — End: 2013-09-23

## 2013-09-17 MED ORDER — PRO-STAT SUGAR FREE PO LIQD
30.0000 mL | Freq: Two times a day (BID) | ORAL | Status: DC
  Administered 2013-09-17: 30 mL
  Filled 2013-09-17 (×2): qty 30

## 2013-09-17 MED ORDER — VITAL HIGH PROTEIN PO LIQD
1000.0000 mL | ORAL | Status: DC
  Administered 2013-09-17: 1000 mL
  Filled 2013-09-17 (×2): qty 1000

## 2013-09-17 MED ORDER — PRO-STAT SUGAR FREE PO LIQD
30.0000 mL | Freq: Three times a day (TID) | ORAL | Status: DC
  Administered 2013-09-17 – 2013-09-21 (×11): 30 mL
  Filled 2013-09-17 (×14): qty 30

## 2013-09-17 MED ORDER — VITAL HIGH PROTEIN PO LIQD
1000.0000 mL | ORAL | Status: DC
  Administered 2013-09-18 – 2013-09-20 (×2): 1000 mL
  Filled 2013-09-17 (×6): qty 1000

## 2013-09-17 MED ORDER — DEXTROSE 50 % IV SOLN
INTRAVENOUS | Status: AC
  Administered 2013-09-17: 50 mL
  Filled 2013-09-17: qty 50

## 2013-09-17 MED ORDER — DEXTROSE 10 % IV SOLN
INTRAVENOUS | Status: DC
  Administered 2013-09-17: 50 mL/h via INTRAVENOUS
  Administered 2013-09-18: 22:00:00 via INTRAVENOUS

## 2013-09-17 MED ORDER — VITAL HIGH PROTEIN PO LIQD
1000.0000 mL | ORAL | Status: DC
  Filled 2013-09-17: qty 1000

## 2013-09-17 NOTE — Progress Notes (Signed)
   [facility] Progress Note   Subjective: reintubated.  I/O net negative 2.6 L yesterday.  BP's dropped and is back on pressors now.  UF now at 100-150/hr.   Filed Vitals:   09/17/13 0500 09/17/13 0600 09/17/13 0700 09/17/13 0800  BP: 103/48  123/92 123/59  Pulse: 109 110 112 109  Temp:      TempSrc:      Resp: $Remo'26 31 22 28  'yVHis$ Height:      Weight: 103.1 kg (227 lb 4.7 oz)     SpO2: 100% 100% 100% 100%   Exam: On vent No jvd Chest clear today, improved RRR no MRG  Abd obese, nondistended  Ext moderate 1-2+ diffuse LE edema  Neuro opens eyes minimally to voice  UA 1.023, 5.5, 0-2wbc, 3-6rbc, few bact UNa 30, UCr 65  FeNa 1.1% Renal US - 11 cm bilat, no hydro, ^echo ECHO mod LVH  EF 55%  DD2. RV mildly dilated w nl function   Assessment:  1 Acute renal failure- ATN d/t shock, oliguric, D#3 CRRT 2 Vol overload severe / pulm edema: starting to improve, still 10-15 kg up from admission   3 PNA / VDRF- on rocephin  4 Multiple myeloma  5 Shock - improving 6 Anemia Hb 9.7  7 DM on insulin SSI  8 Nutrition on TF's, reglan IV   Plan- cont CRRT for now, maximal UF as BP tolerates, follow wt's / cxr   Kelly Splinter MD  pager [phone]    cell [phone]  09/17/2013, 10:17 AM     Recent Labs Lab 09/15/13 1600 09/16/13 0325 09/16/13 1500  NA 145 143 138  K 3.4* 3.2* 3.9  CL 114* 110 105  CO2 $Re'19 19 24  'Snv$ GLUCOSE 173* 143* 179*  BUN 62* 51* 36*  CREATININE 3.41* 2.76* 1.89*  CALCIUM 7.6* 7.5* 7.5*  PHOS 3.2 2.7 4.2    Recent Labs Lab 09/15/13 1600 09/16/13 0325 09/16/13 1500  ALBUMIN 1.5* 1.6* 1.8*    Recent Labs Lab 09/15/13 0500 09/16/13 0325 09/17/13 0500  WBC 4.9 7.4 10.1  HGB 10.0* 10.5* 10.1*  HCT 29.6* 30.6* 30.1*  MCV 85.1 83.8 85.5  PLT 80* 79* 74*   . antiseptic oral rinse  15 mL Mouth Rinse QID  . chlorhexidine  15 mL Mouth Rinse BID  . feeding supplement (PRO-STAT SUGAR FREE 64)  30 mL Per Tube BID  . feeding supplement (VITAL  HIGH PROTEIN)  1,000 mL Per Tube Q24H  . heparin subcutaneous  5,000 Units Subcutaneous 3 times per day  . hydrocortisone sod succinate (SOLU-CORTEF) inj  50 mg Intravenous Q8H  . magic mouthwash w/lidocaine  5 mL Oral QID   . sodium chloride 20 mL/hr (09/17/13 0200)  . dextrose 50 mL/hr (09/17/13 0920)  . phenylephrine (NEO-SYNEPHRINE) Adult infusion 25 mcg/min (09/17/13 0734)  . dialysis replacement fluid (prismasate) 400 mL/hr at 09/16/13 1422  . dialysis replacement fluid (prismasate) 200 mL/hr at 09/16/13 1621  . dialysate (PRISMASATE) 2,000 mL/hr at 09/17/13 0849   albuterol, fentaNYL, heparin, lidocaine-prilocaine, metoprolol, sodium chloride, sodium chloride

## 2013-09-17 NOTE — Progress Notes (Signed)
NUTRITION FOLLOW UP  DOCUMENTATION CODES  Per approved criteria   -Severe malnutrition in the context of acute illness or injury  -Obesity Unspecified    Patient meets criteria for severe malnutrition related to chronic illness AEB 12% weight loss in <2 months, <75% intake for > 1 month.   Intervention:   Utilize 50M PEPuP Protocol: Vital High Protein via OGT at 45 ml/hr (1080 ml per day) with Prostat 65ml TID to provide 1380 calories (25 kcal/kg ideal body weight), 140 grams protein, 903 ml free water daily. RD to continue to follow nutrition care plan.  Nutrition Dx:   Inadequate oral intake related to inability to eat as evidenced by NPO status - ongoing.  Goal:   Enteral nutrition to provide 60-70% of estimated calorie needs (22-25 kcals/kg ideal body weight) and 100% of estimated protein needs, based on ASPEN guidelines for permissive underfeeding in critically ill obese individuals. Unmet.  Monitor:   Weights, labs, TF tolerance, vent status   Assessment:   Patient with a 3 week hx of weakness, decreased appetite and difficulty swallowing. Found to have positive strep antigen with concern for L retrocardiac infiltrate. Requiring intubation today due to hypercarbic respiratory failure. PNA and metabolic encephalopathy. Hx of light chain multiple myeloma and received Kyprolis receiving last therapy on 06/2013 and Zometa monthly.   Patient was transferred to MICU at [facility] 3/24 for CRRT (starting 3/25). Continues on CRRT at this time. Pt was extubated 3/26, however required re-intubation later the same day. RD consulted to resume enteral nutrition.  Patient remains intubated on ventilator support.  MV: 12.4 L/min Temp (24hrs), Avg:96.7 F (35.9 C), Min:92.2 F (33.4 C), Max:99.6 F (37.6 C)  CBG's: 59, 54, 56 Sodium, potassium and phosphorus WNL Pt remains +34 lb above admit weight  TF: Vital High Protein at 75ml/hr with Prostat 30 ml BID which provides 1160 calories and 84g  protein per day.  Height: Ht Readings from Last 1 Encounters:  09/07/13 $RemoveB'5\' 4"'RYRJVBHf$  (1.626 m)    Weight Status:   Wt Readings from Last 1 Encounters:  09/17/13 227 lb 4.7 oz (103.1 kg)  Admit wt         193 lb 9 oz (87.8 kg)   Re-estimated needs:  Kcal: 1915 Underfeeding kcal goal: 1200 - 1363 kcal Protein: >130 grams Fluid: per MD  Skin: open wound on sacrum  Diet Order:  NPO   Intake/Output Summary (Last 24 hours) at 09/17/13 1118 Last data filed at 09/17/13 0800  Gross per 24 hour  Intake 900.85 ml  Output   3329 ml  Net -2428.15 ml    Last BM: 3/26 - diarrhea via rectal tube   Labs:   Recent Labs Lab 09/14/13 0516  09/15/13 1600 09/16/13 0325 09/16/13 1500 09/17/13 0500  NA 146  < > 145 143 138  --   K 3.5*  < > 3.4* 3.2* 3.9  --   CL 115*  < > 114* 110 105  --   CO2 19  < > $R'19 19 24  'Cb$ --   BUN 56*  < > 62* 51* 36*  --   CREATININE 3.57*  < > 3.41* 2.76* 1.89*  --   CALCIUM 7.9*  < > 7.6* 7.5* 7.5*  --   MG 1.9  --   --  2.1  --  2.1  PHOS 3.0  --  3.2 2.7 4.2  --   GLUCOSE 150*  < > 173* 143* 179*  --   < > =  values in this interval not displayed.  CBG (last 3)   Recent Labs  09/17/13 0345 09/17/13 0748 09/17/13 0851  GLUCAP 56* 54* 59*    Scheduled Meds: . antiseptic oral rinse  15 mL Mouth Rinse QID  . chlorhexidine  15 mL Mouth Rinse BID  . feeding supplement (PRO-STAT SUGAR FREE 64)  30 mL Per Tube BID  . feeding supplement (VITAL HIGH PROTEIN)  1,000 mL Per Tube Q24H  . heparin subcutaneous  5,000 Units Subcutaneous 3 times per day  . hydrocortisone sod succinate (SOLU-CORTEF) inj  50 mg Intravenous Q8H  . magic mouthwash w/lidocaine  5 mL Oral QID    Continuous Infusions: . sodium chloride 20 mL/hr (09/17/13 0200)  . dextrose 50 mL/hr (09/17/13 0920)  . phenylephrine (NEO-SYNEPHRINE) Adult infusion 25 mcg/min (09/17/13 0734)  . dialysis replacement fluid (prismasate) 400 mL/hr at 09/16/13 1422  . dialysis replacement fluid  (prismasate) 200 mL/hr at 09/16/13 1621  . dialysate (PRISMASATE) 1,500 mL/hr at 09/17/13 1107    Inda Coke MS, RD, LDN Inpatient Registered Dietitian Pager: [phone] After-hours pager: [phone]

## 2013-09-17 NOTE — Progress Notes (Signed)
Hypoglycemic Event  CBG: 54 (0805)  Treatment: amp dextrose  Symptoms: none  Follow-up CBG: 59 (0825)  Possible Reasons for Event: unknown  Comments/MD notified:Dr. Doree Fudge, MD  Dextrose 10% IVF ordered at 50cc/hr Dr. Alva Garnet aware Tube feeds ordered to reinitiate     Lorenso Courier  Remember to initiate Hypoglycemia Order Set & complete

## 2013-09-17 NOTE — Progress Notes (Signed)
PULMONARY / CRITICAL CARE MEDICINE   Name: [name] MRN: [id number] DOB: 11-18-1950    ADMISSION DATE:  08/28/2013 CONSULTATION DATE:  09/07/13  REFERRING MD :  Dr. Rito Ehrlich PRIMARY SERVICE: TR[facility]-->PCCM  CHIEF COMPLAINT:  Acute Respiratory Failure  BRIEF PATIENT DESCRIPTION: 63 y/o F admitted with 3 wk hx of weakness, decreased appetite & difficulty swallowing.  Found to have positive strep antigen with concern for L retrocardiac infiltrate.  Progressed to hypercarbic respiratory failure, PCCM consulted.  SIGNIFICANT EVENTS / STUDIES:  3/16 Admit with 3 wk hx of weakness, decreased appetite & difficulty swallowing.  Found to have positive strep antigen with concern for L retrocardiac infiltrate.  3/17 Hypercarbic respiratory failure, intubated early am 3/17 ECHO:  mod LVH, nml systolic fxn, EF 16-50%, nml wall motion, grade II diastolic dysfunction  3/18  remains on vent, levophed & fentanyl gtt 3/20 afib with RVR overnight 3/23 failed SBT  3/24 failed SBT 3/24 renal US: Echogenic kidneys suggesting chronic medical renal disease. 3/24 Transferred to [facility] for CRRT 3/25 CRRT initiated 3/26 passed SBT and F/C. Extubated. No distress on recheck but very lethargic. Partially improved after naloxone 3/26 PM: reintubated for resp acidosis 3/27 Brain MRI (ordered):   LINES / TUBES: Port-A-Cath 02/27/10 ETT 3/17 >> 3/26, 3/26 >>  L IJ TLC 3/17 >>  R femoral HD cath 3/24 >>  L radial A-line 3/26 >>   CULTURES: BCx2 3/16>>>neg RVP 3/16>>>neg Sputum 3/16>>>nml flora UC 3/16>>>neg UA 3/16>>>3-6 wbc, few bacteria.  Asymptomatic on admit. Strep AG 3/16>>>positive  ANTIBIOTICS: Azithro 3/16>>>3/17 Cefepime 3/16>>>3/17 Rocephin 3/16>> 3/25  SUBJECTIVE:   NAD. RASS -2. Not on continuous sedation  VITAL SIGNS: Temp:  [92.2 F (33.4 C)-99.6 F (37.6 C)] 97.4 F (36.3 C) (03/27 1700) Pulse Rate:  [96-119] 119 (03/27 1400) Resp:  [17-31] 26 (03/27 1500) BP:  (66-136)/(32-114) 109/55 mmHg (03/27 1500) SpO2:  [96 %-100 %] 100 % (03/27 1400) Arterial Line BP: (95-114)/(57-73) 99/66 mmHg (03/27 1500) FiO2 (%):  [30 %-40 %] 40 % (03/27 1527) Weight:  [103.1 kg (227 lb 4.7 oz)] 103.1 kg (227 lb 4.7 oz) (03/27 0500)  HEMODYNAMICS: CVP:  [6 mmHg-14 mmHg] 13 mmHg  VENTILATOR SETTINGS: Vent Mode:  [-] PRVC FiO2 (%):  [30 %-40 %] 40 % Set Rate:  [20 bmp] 20 bmp Vt Set:  [440 mL] 440 mL PEEP:  [5 cmH20] 5 cmH20 Pressure Support:  [20 cmH20] 20 cmH20 Plateau Pressure:  [18 cmH20-30 cmH20] 26 cmH20  INTAKE / OUTPUT: Intake/Output     03/26 0701 - 03/27 0700 03/27 0701 - 03/28 0700   I.V. (mL/kg) 956.1 (9.3) 540.5 (5.2)   Other 20    NG/GT 135 100   IV Piggyback     Total Intake(mL/kg) 1111.1 (10.8) 640.5 (6.2)   Urine (mL/kg/hr) 132 (0.1) 5 (0)   Other 3493 (1.4) 2409 (2.3)   Stool 100 (0)    Total Output 3725 2414   Net -2613.9 -1773.5          PHYSICAL EXAMINATION: Gen: RASS -2. Not F/C. NAD Neuro: no focal deficits HEENT: WNL PULM: clear anteriorly CV: RRR, soft systolic murmur @ LSB AB: BS+, soft, nontender, +BS Ext: warm, trace edema Derm: no rash or skin breakdown    LABS: I have reviewed all of today's lab results. Relevant abnormalities are discussed in the A/P section  CXR: NSC diffuse IS prominence. LLL atx/eff  ASSESSMENT / PLAN:  PULMONARY A: Acute Respiratory Failure LLL PNA - strep Ag positive  Suspect pulmonary edema. P:   Vent settings reviewed and adjusted Cont vent bundle Daily SBT as indicated  CARDIOVASCULAR A:  HLD CHF Shock, septic, resolved AFRVR, resolved P:  Monitor rhythm and BP  RENAL A:   Acute on Chronic Renal Insufficiency Lactic acidosis, resolved Hx Hypercalcemia due to MM - inactive problem P:   Monitor BMET intermittently Monitor I/Os Correct electrolytes as indicated CRRT per Renal service  GASTROINTESTINAL A:   GERD Obesity Dysphagia P:   SUP: PPI per  tube Resume TFs 3/27  HEMATOLOGIC A:   Multiple Myeloma, relapsed Thrombocytopenia Hx DVT - 2011 P:  DVT px: SQ hep and SCDs Monitor CBC intermittently Transfuse per usual ICU guidelines Holding full anticoagulation  INFECTIOUS A:   Pneumococcal PNA - treated Bacteriuria, asymptomatic P:   Micro and abx as above  ENDOCRINE A:   DM 2, hyperglycemia  P:   Cont SSI  NEUROLOGIC A:   Toxic - metabolic Encephalopathy ? Impaired ventilatory drive P:   Minimize sedative/analgesics MRI brain ordered 3/27   Code status: FULL   35 mins CCM time   Merton Border, MD ; [facility] [phone].  After 5:30 PM or weekends, call [phone]

## 2013-09-18 ENCOUNTER — Inpatient Hospital Stay (HOSPITAL_COMMUNITY): Payer: Medicaid Other

## 2013-09-18 LAB — GLUCOSE, CAPILLARY
GLUCOSE-CAPILLARY: 77 mg/dL (ref 70–99)
Glucose-Capillary: 113 mg/dL — ABNORMAL HIGH (ref 70–99)
Glucose-Capillary: 152 mg/dL — ABNORMAL HIGH (ref 70–99)
Glucose-Capillary: 82 mg/dL (ref 70–99)
Glucose-Capillary: 87 mg/dL (ref 70–99)

## 2013-09-18 LAB — RENAL FUNCTION PANEL
ALBUMIN: 1.7 g/dL — AB (ref 3.5–5.2)
Albumin: 1.5 g/dL — ABNORMAL LOW (ref 3.5–5.2)
BUN: 20 mg/dL (ref 6–23)
BUN: 21 mg/dL (ref 6–23)
CO2: 22 mEq/L (ref 19–32)
CO2: 22 meq/L (ref 19–32)
CREATININE: 1.19 mg/dL — AB (ref 0.50–1.10)
Calcium: 7.3 mg/dL — ABNORMAL LOW (ref 8.4–10.5)
Calcium: 7.3 mg/dL — ABNORMAL LOW (ref 8.4–10.5)
Chloride: 98 mEq/L (ref 96–112)
Chloride: 96 mEq/L (ref 96–112)
Creatinine, Ser: 1.07 mg/dL (ref 0.50–1.10)
GFR calc Af Amer: 55 mL/min — ABNORMAL LOW (ref >90)
GFR calc Af Amer: 63 mL/min — ABNORMAL LOW (ref >90)
GFR calc non Af Amer: 48 mL/min — ABNORMAL LOW (ref >90)
GFR, EST NON AFRICAN AMERICAN: 54 mL/min — AB (ref >90)
Glucose, Bld: 139 mg/dL — ABNORMAL HIGH (ref 70–99)
Glucose, Bld: 164 mg/dL — ABNORMAL HIGH (ref 70–99)
Phosphorus: 2.2 mg/dL — ABNORMAL LOW (ref 2.3–4.6)
Phosphorus: 1.7 mg/dL — ABNORMAL LOW (ref 2.3–4.6)
Potassium: 4 mEq/L (ref 3.7–5.3)
Potassium: 4 mEq/L (ref 3.7–5.3)
Sodium: 134 mEq/L — ABNORMAL LOW (ref 137–147)
Sodium: 131 mEq/L — ABNORMAL LOW (ref 137–147)

## 2013-09-18 LAB — CBC
HEMATOCRIT: 29.7 % — AB (ref 36.0–46.0)
HEMOGLOBIN: 10.4 g/dL — AB (ref 12.0–15.0)
MCH: 29.2 pg (ref 26.0–34.0)
MCHC: 35 g/dL (ref 30.0–36.0)
MCV: 83.4 fL (ref 78.0–100.0)
Platelets: 74 10*3/uL — ABNORMAL LOW (ref 150–400)
RBC: 3.56 MIL/uL — ABNORMAL LOW (ref 3.87–5.11)
RDW: 15.9 % — ABNORMAL HIGH (ref 11.5–15.5)
WBC: 9.8 10*3/uL (ref 4.0–10.5)

## 2013-09-18 LAB — TSH: TSH: 1.457 u[IU]/mL (ref 0.350–4.500)

## 2013-09-18 LAB — APTT: aPTT: 37 seconds (ref 24–37)

## 2013-09-18 LAB — MAGNESIUM: Magnesium: 2.3 mg/dL (ref 1.5–2.5)

## 2013-09-18 MED ORDER — FENTANYL CITRATE 0.05 MG/ML IJ SOLN
25.0000 ug | INTRAMUSCULAR | Status: DC | PRN
  Administered 2013-09-18: 75 ug via INTRAVENOUS
  Administered 2013-09-18 – 2013-09-20 (×8): 50 ug via INTRAVENOUS
  Filled 2013-09-18 (×8): qty 2

## 2013-09-18 MED ORDER — WHITE PETROLATUM GEL
Status: AC
  Administered 2013-09-18: 12:00:00
  Filled 2013-09-18: qty 5

## 2013-09-18 NOTE — Progress Notes (Signed)
PULMONARY / CRITICAL CARE MEDICINE   Name: [name] MRN: [id number] DOB: [birth year]    ADMISSION DATE:  08/29/2013 CONSULTATION DATE:  09/07/13  REFERRING MD :  Dr. Maryland Pink PRIMARY SERVICE: TRH-->PCCM  CHIEF COMPLAINT:  Acute Respiratory Failure  BRIEF PATIENT DESCRIPTION: 63 y/o F admitted with 3 wk hx of weakness, decreased appetite & difficulty swallowing.  Found to have positive strep antigen with concern for L retrocardiac infiltrate.  Progressed to hypercarbic respiratory failure, PCCM consulted.  SIGNIFICANT EVENTS / STUDIES:  3/16 Admit with 3 wk hx of weakness, decreased appetite & difficulty swallowing.  Found to have positive strep antigen with concern for L retrocardiac infiltrate.  3/17 Hypercarbic respiratory failure, intubated early am 3/17 ECHO:  mod LVH, nml systolic fxn, EF 75-10%, nml wall motion, grade II diastolic dysfunction  2/58  remains on vent, levophed & fentanyl gtt 3/20 afib with RVR overnight 3/23 failed SBT  3/24 failed SBT 3/24 renal US: Echogenic kidneys suggesting chronic medical renal disease. 3/24 Transferred to [facility] for CRRT 3/25 CRRT initiated 3/26 passed SBT and F/C. Extubated. No distress on recheck but very lethargic. Partially improved after naloxone 3/26 PM: reintubated for resp acidosis 3/27 Brain MRI (ordered):   LINES / TUBES: Port-A-Cath 02/27/10 ETT 3/17 >> 3/26, 3/26 >>  L IJ TLC 3/17 >>  R femoral HD cath 3/24 >>  L radial A-line 3/26 >>   CULTURES: BCx2 3/16>>>neg RVP 3/16>>>neg Sputum 3/16>>>nml flora UC 3/16>>>neg UA 3/16>>>3-6 wbc, few bacteria.  Asymptomatic on admit. Strep AG 3/16>>>positive  ANTIBIOTICS: Azithro 3/16>>>3/17 Cefepime 3/16>>>3/17 Rocephin 3/16>> 3/25  SUBJECTIVE:   Neo nearly off, following commands  VITAL SIGNS: Temp:  [97.4 F (36.3 C)-99 F (37.2 C)] 98.4 F (36.9 C) (03/28 1218) Pulse Rate:  [97-135] 135 (03/28 1207) Resp:  [22-35] 29 (03/28 1207) BP: (98-136)/(44-82)  103/44 mmHg (03/28 1207) SpO2:  [100 %] 100 % (03/28 1207) Arterial Line BP: (82-123)/(55-82) 101/69 mmHg (03/28 0700) FiO2 (%):  [40 %] 40 % (03/28 1207) Weight:  [97 kg (213 lb 13.5 oz)] 97 kg (213 lb 13.5 oz) (03/28 0500)  HEMODYNAMICS:    VENTILATOR SETTINGS: Vent Mode:  [-] PRVC FiO2 (%):  [40 %] 40 % Set Rate:  [16 bmp-20 bmp] 16 bmp Vt Set:  [440 mL] 440 mL PEEP:  [5 cmH20] 5 cmH20 Plateau Pressure:  [18 cmH20-23 cmH20] 18 cmH20  INTAKE / OUTPUT: Intake/Output     03/27 0701 - 03/28 0700 03/28 0701 - 03/29 0700   I.V. (mL/kg) 2006.1 (20.7) 440.7 (4.5)   Other     NG/GT 600 225   Total Intake(mL/kg) 2606.1 (26.9) 665.7 (6.9)   Urine (mL/kg/hr) 25 (0)    Other 7531 (3.2) 1273 (2.4)   Stool 100 (0)    Total Output 7656 1273   Net -5049.9 -607.3          PHYSICAL EXAMINATION: Gen: arouses to voice, follows commands HEENT: NCAT, ETT PULM: Rhonchi bilaterally CV: RRR, no mgr AB: BS+, soft,nontender Ext: warm, some edema noted Neuro: Asleep but arouses to voice, follows commands   LABS:  Recent Labs Lab 09/16/13 0325 09/17/13 0500 09/18/13 0500  HGB 10.5* 10.1* 10.4*  HCT 30.6* 30.1* 29.7*  WBC 7.4 10.1 9.8  PLT 79* 74* 74*     Recent Labs Lab 09/13/13 0214 09/14/13 0516  09/16/13 0325 09/16/13 1500 09/17/13 0439 09/17/13 0500 09/17/13 1600 09/18/13 0500 09/18/13 0900  NA 143 146  < > 143 138 139  --  134*  --  134*  K 3.7 3.5*  < > 3.2* 3.9 3.6*  --  3.9  --  4.0  CL 111 115*  < > 110 105 104  --  101  --  98  CO2 18* 19  < > _0 --  23  --  22  GLUCOSE 147* 150*  < > 143* 179* 82  --  114*  --  139*  BUN 50* 56*  < > 51* 36* 29*  --  22  --  20  CREATININE 3.26* 3.57*  < > 2.76* 1.89* 1.61*  --  1.32*  --  1.19*  CALCIUM 8.1* 7.9*  < > 7.5* 7.5* 7.2*  --  7.2*  --  7.3*  MG 2.0 1.9  --  2.1  --   --  2.1  --  2.3  --   PHOS 2.9 3.0  < > 2.7 4.2 2.3  --  2.0*  --  2.2*  < > = values in this interval not displayed.   CXR:  Hazy  opacities left lower hemi thorax likely represent combination  of layering pleural fluid and underlying atelectasis. Infection is not excluded.   ASSESSMENT / PLAN:  PULMONARY A: Acute Respiratory Failure > extubation still limited by mental status, LLL PNA - strep Ag positive Suspect pulmonary edema. P:   Continue full vent support Continue volume removal for pulm edema Daily SBT as indicated  CARDIOVASCULAR A:  HLD CHF Shock, septic, resolved; persistent low dose neo requirement> suspect sedation relate AFRVR, resolved P:  Monitor rhythm and BP Wean neo   RENAL A:   Acute on Chronic Renal Insufficiency, CVVHD Day #3 Lactic acidosis, resolved Hx Hypercalcemia due to MM - inactive problem P:   Monitor BMET intermittently Monitor I/Os Correct electrolytes as indicated CRRT per Renal service, continue volume removal  GASTROINTESTINAL A:   GERD Obesity Dysphagia P:   SUP: PPI per tube Resume TFs 3/27  HEMATOLOGIC A:   Multiple Myeloma, relapsed Thrombocytopenia Hx DVT - 2011 P:  DVT px: SQ hep and SCDs Monitor CBC intermittently Transfuse per usual ICU guidelines Holding full anticoagulation  INFECTIOUS A:   Pneumococcal PNA - treated Bacteriuria, asymptomatic P:   Micro and abx as above  ENDOCRINE A:   DM 2, hyperglycemia  P:   Cont SSI  NEUROLOGIC A:   Toxic - metabolic Encephalopathy poorly understood ? Impaired ventilatory drive P:   Minimize sedative/analgesics MRI brain when able to transport off floor   Code status: FULL   35 mins CCM time  [name] PCCM Pager: [phone] Cell: [phone] If no response, call [phone]

## 2013-09-18 NOTE — Progress Notes (Signed)
  [facility] Progress Note   Subjective: Wt down 97kg, I/O yesterday negative 5046mL.  On pressors, +lots of white secretions still  Filed Vitals:   09/18/13 0600 09/18/13 0700 09/18/13 0808 09/18/13 0827  BP: 115/69 111/66 117/72   Pulse:   115   Temp:    98.6 F (37 C)  TempSrc:    Oral  Resp: $Remo'24 26 28   'XldGt$ Height:      Weight:      SpO2:   100%    Exam: On vent No jvd Chest clear today RRR no MRG  Abd obese, nondistended  Moderate 1+ diffuse LE edema  Neuro opens eyes minimally to voice  UA 1.023, 5.5, 0-2wbc, 3-6rbc, few bact UNa 30, UCr 65  FeNa 1.1% Renal US - 11 cm bilat, no hydro, ^echo ECHO mod LVH  EF 55%  DD2. RV mildly dilated w nl function   Assessment:  1 Acute renal failure- ATN d/t shock, oliguric, D#3 CRRT 2 Vol overload severe / pulm edema: improving, wt down 97kg (88 on admission) 3 PNA / VDRF- on rocephin  4 Multiple myeloma  5 Shock - improving 6 Anemia Hb 9.7  7 DM on insulin SSI  8 Nutrition on TF's, reglan IV   Plan- cont CRRT, decrease to 100-150 cc/hr UF, try to wean pressors down   Kelly Splinter MD  pager [phone]    cell [phone]  09/18/2013, 9:33 AM     Recent Labs Lab 09/16/13 1500 09/17/13 0439 09/17/13 1600  NA 138 139 134*  K 3.9 3.6* 3.9  CL 105 104 101  CO2 $Re'24 21 23  'QQc$ GLUCOSE 179* 82 114*  BUN 36* 29* 22  CREATININE 1.89* 1.61* 1.32*  CALCIUM 7.5* 7.2* 7.2*  PHOS 4.2 2.3 2.0*    Recent Labs Lab 09/16/13 1500 09/17/13 0439 09/17/13 1600  ALBUMIN 1.8* 1.7* 1.7*    Recent Labs Lab 09/16/13 0325 09/17/13 0500 09/18/13 0500  WBC 7.4 10.1 9.8  HGB 10.5* 10.1* 10.4*  HCT 30.6* 30.1* 29.7*  MCV 83.8 85.5 83.4  PLT 79* 74* 74*   . antiseptic oral rinse  15 mL Mouth Rinse QID  . chlorhexidine  15 mL Mouth Rinse BID  . feeding supplement (PRO-STAT SUGAR FREE 64)  30 mL Per Tube TID  . heparin subcutaneous  5,000 Units Subcutaneous 3 times per day  . hydrocortisone sod succinate (SOLU-CORTEF)  inj  50 mg Intravenous Q8H  . magic mouthwash w/lidocaine  5 mL Oral QID   . sodium chloride 10 mL/hr (09/17/13 1900)  . dextrose 50 mL/hr (09/17/13 0920)  . feeding supplement (VITAL HIGH PROTEIN) 1,000 mL (09/18/13 0700)  . phenylephrine (NEO-SYNEPHRINE) Adult infusion 100 mcg/min (09/17/13 2111)  . dialysis replacement fluid (prismasate) 400 mL/hr at 09/17/13 1701  . dialysis replacement fluid (prismasate) 200 mL/hr at 09/17/13 1936  . dialysate (PRISMASATE) 1,500 mL/hr at 09/18/13 0614   albuterol, fentaNYL, heparin, lidocaine-prilocaine, metoprolol, sodium chloride, sodium chloride

## 2013-09-18 NOTE — Consult Note (Addendum)
WOC wound consult note Reason for Consult: Discolored and friable skin at sacrum and coccyx; superficial skin loss. Bedside RN inquiring if patient is spending most of time in bed and on back at home and patient nods head affirmatively. Wound type:suspected deep tissue injury (sDTI) Pressure Ulcer POA: No Measurement:10cm x 11cm with two partial thickness open areas at the most distal areas bilaterally measuring 3cm x 2cm on left and 2cm x 2cm on right. Wound bed:red, moist, clean Drainage (amount, consistency, odor) serous Periwound: discolored; skin tone is deeper than surrounding skin with purple and brown hues Dressing procedure/placement/frequency: I will implement a soft silicone foam dressing for this area (Allevyn Sacral, Kellie Simmering [phone]). The patient is on a therapeutic sleep surface with low air loss feature at this time (in ICU). When a transfer to the floor is indicated, consider ordering a mattress replacement with low air loss feature.  Additionally, there is intertriginous dermatitis (skin fold moisture creating a contact dermatitis) at the right lateral and medial panus.  I will provide an antimicrobial textile for resolution of this issue and instructions for its use. [place] nursing team will not follow, but will remain available to this patient, the nursing and medical team.  Please re-consult if needed. Thanks, Maudie Flakes, MSN, RN, [place], [place], [place] [phone])

## 2013-09-19 ENCOUNTER — Inpatient Hospital Stay (HOSPITAL_COMMUNITY): Payer: Medicaid Other

## 2013-09-19 LAB — RENAL FUNCTION PANEL
ALBUMIN: 1.5 g/dL — AB (ref 3.5–5.2)
BUN: 23 mg/dL (ref 6–23)
CALCIUM: 7.1 mg/dL — AB (ref 8.4–10.5)
CO2: 22 mEq/L (ref 19–32)
Chloride: 95 mEq/L — ABNORMAL LOW (ref 96–112)
Creatinine, Ser: 1.02 mg/dL (ref 0.50–1.10)
GFR calc Af Amer: 66 mL/min — ABNORMAL LOW (ref >90)
GFR calc non Af Amer: 57 mL/min — ABNORMAL LOW (ref >90)
Glucose, Bld: 190 mg/dL — ABNORMAL HIGH (ref 70–99)
PHOSPHORUS: 1.6 mg/dL — AB (ref 2.3–4.6)
POTASSIUM: 3.6 meq/L — AB (ref 3.7–5.3)
Sodium: 129 mEq/L — ABNORMAL LOW (ref 137–147)

## 2013-09-19 LAB — GLUCOSE, CAPILLARY
GLUCOSE-CAPILLARY: 157 mg/dL — AB (ref 70–99)
GLUCOSE-CAPILLARY: 168 mg/dL — AB (ref 70–99)
Glucose-Capillary: 158 mg/dL — ABNORMAL HIGH (ref 70–99)
Glucose-Capillary: 163 mg/dL — ABNORMAL HIGH (ref 70–99)
Glucose-Capillary: 163 mg/dL — ABNORMAL HIGH (ref 70–99)
Glucose-Capillary: 134 mg/dL — ABNORMAL HIGH (ref 70–99)

## 2013-09-19 LAB — MAGNESIUM: Magnesium: 2.2 mg/dL (ref 1.5–2.5)

## 2013-09-19 LAB — APTT: APTT: 34 s (ref 24–37)

## 2013-09-19 MED ORDER — HEPARIN SODIUM (PORCINE) 1000 UNIT/ML DIALYSIS
1000.0000 [IU] | INTRAMUSCULAR | Status: DC | PRN
  Filled 2013-09-19: qty 6

## 2013-09-19 NOTE — Progress Notes (Signed)
  [facility] Progress Note   Subjective: I/O neg 2.3 L yesterday, wt down 92kg   Filed Vitals:   09/19/13 0715 09/19/13 0759 09/19/13 0809 09/19/13 0834  BP: 97/71  85/66   Pulse:      Temp:    98.9 F (37.2 C)  TempSrc:    Oral  Resp: 22  30   Height:      Weight:      SpO2:  99% 99%    Exam: On vent No jvd Chest clear today RRR no MRG  Abd obese, nondistended  Minimal LE edema  Neuro opens eyes minimally to voice  UA 1.023, 5.5, 0-2wbc, 3-6rbc, few bact UNa 30, UCr 65  FeNa 1.1% Renal US - 11 cm bilat, no hydro, ^echo ECHO mod LVH  EF 55%  DD2. RV mildly dilated w nl function   Assessment:  1 Acute renal failure- ATN d/t shock, oliguric, D#3 CRRT. Still not making urine 2 Vol overload severe / pulm edema: much better, wt down 92kg (88 on admission) 3 PNA / VDRF- off abx 4 Multiple myeloma  5 Shock - on neo gtt still 6 Anemia Hb 9.7  7 DM on insulin SSI  8 Nutrition on TF's   Plan- stop CRRT, let volume come up a little so pressors can be weaned off   Kelly Splinter MD  pager [phone]    cell [phone]  09/19/2013, 9:44 AM     Recent Labs Lab 09/18/13 0900 09/18/13 1600 09/19/13 0430  NA 134* 131* 129*  K 4.0 4.0 3.6*  CL 98 96 95*  CO2 $Re'22 22 22  'RvC$ GLUCOSE 139* 164* 190*  BUN $Re'20 21 23  'Cet$ CREATININE 1.19* 1.07 1.02  CALCIUM 7.3* 7.3* 7.1*  PHOS 2.2* 1.7* 1.6*    Recent Labs Lab 09/18/13 0900 09/18/13 1600 09/19/13 0430  ALBUMIN 1.7* 1.5* 1.5*    Recent Labs Lab 09/16/13 0325 09/17/13 0500 09/18/13 0500  WBC 7.4 10.1 9.8  HGB 10.5* 10.1* 10.4*  HCT 30.6* 30.1* 29.7*  MCV 83.8 85.5 83.4  PLT 79* 74* 74*   . antiseptic oral rinse  15 mL Mouth Rinse QID  . chlorhexidine  15 mL Mouth Rinse BID  . feeding supplement (PRO-STAT SUGAR FREE 64)  30 mL Per Tube TID  . heparin subcutaneous  5,000 Units Subcutaneous 3 times per day  . hydrocortisone sod succinate (SOLU-CORTEF) inj  50 mg Intravenous Q8H  . magic mouthwash  w/lidocaine  5 mL Oral QID   . sodium chloride 10 mL/hr (09/17/13 1900)  . dextrose 50 mL/hr at 09/18/13 2212  . feeding supplement (VITAL HIGH PROTEIN) 1,000 mL (09/18/13 2024)  . phenylephrine (NEO-SYNEPHRINE) Adult infusion 90 mcg/min (09/19/13 0500)  . dialysis replacement fluid (prismasate) 400 mL/hr at 09/19/13 0646  . dialysis replacement fluid (prismasate) 200 mL/hr at 09/18/13 2108  . dialysate (PRISMASATE) 1,500 mL/hr at 09/19/13 0619   albuterol, fentaNYL, heparin, lidocaine-prilocaine, metoprolol, sodium chloride, sodium chloride

## 2013-09-19 NOTE — Progress Notes (Signed)
Patient transported to MRI and back on ventilator without complications.

## 2013-09-19 NOTE — Progress Notes (Signed)
PULMONARY / CRITICAL CARE MEDICINE   Name: [name] MRN: [id number] DOB: 06/17/1951    ADMISSION DATE:  08/30/2013 CONSULTATION DATE:  09/07/13  REFERRING MD :  Dr. Krishnan PRIMARY SERVICE: TRH-->PCCM  CHIEF COMPLAINT:  Acute Respiratory Failure  BRIEF PATIENT DESCRIPTION: 63 y/o F admitted with 3 wk hx of weakness, decreased appetite & difficulty swallowing.  Found to have positive strep antigen with concern for L retrocardiac infiltrate.  Progressed to hypercarbic respiratory failure, PCCM consulted.  SIGNIFICANT EVENTS / STUDIES:  3/16 Admit with 3 wk hx of weakness, decreased appetite & difficulty swallowing.  Found to have positive strep antigen with concern for L retrocardiac infiltrate.  3/17 Hypercarbic respiratory failure, intubated early am 3/17 ECHO:  mod LVH, nml systolic fxn, EF 50-60%, nml wall motion, grade II diastolic dysfunction  3/18  remains on vent, levophed & fentanyl gtt 3/20 afib with RVR overnight 3/23 failed SBT  3/24 failed SBT 3/24 renal US: Echogenic kidneys suggesting chronic medical renal disease. 3/24 Transferred to [facility] for CRRT 3/25 CRRT initiated 3/26 passed SBT and F/C. Extubated. No distress on recheck but very lethargic. Partially improved after naloxone 3/26 PM: reintubated for resp acidosis 3/27 Brain MRI (ordered):   LINES / TUBES: Port-A-Cath 02/27/10 ETT 3/17 >> 3/26, 3/26 >>  L IJ TLC 3/17 >>  R femoral HD cath 3/24 >>  L radial A-line 3/26 >>   CULTURES: BCx2 3/16>>>neg RVP 3/16>>>neg Sputum 3/16>>>nml flora UC 3/16>>>neg UA 3/16>>>3-6 wbc, few bacteria.  Asymptomatic on admit. Strep AG 3/16>>>positive Sputum CX 3/29>>  ANTIBIOTICS: Azithro 3/16>>>3/17 Cefepime 3/16>>>3/17 Rocephin 3/16>> 3/25  SUBJECTIVE:   Remains on pressor support  Remains on CRRT w/ neg bal  Follows commands ,  More alert   VITAL SIGNS: Temp:  [97.3 F (36.3 C)-99 F (37.2 C)] 97.3 F (36.3 C) (03/29 1257) Pulse Rate:  [55-129] 88  (03/29 0615) Resp:  [21-38] 27 (03/29 1100) BP: (81-114)/(21-99) 100/53 mmHg (03/29 1100) SpO2:  [90 %-100 %] 99 % (03/29 0809) Arterial Line BP: (62-103)/(52-87) 94/87 mmHg (03/29 1100) FiO2 (%):  [40 %] 40 % (03/29 1101) Weight:  [92.1 kg (203 lb 0.7 oz)] 92.1 kg (203 lb 0.7 oz) (03/29 0530)  HEMODYNAMICS: CVP:  [9 mmHg] 9 mmHg  VENTILATOR SETTINGS: Vent Mode:  [-] CPAP;PSV FiO2 (%):  [40 %] 40 % Set Rate:  [16 bmp] 16 bmp Vt Set:  [440 mL] 440 mL PEEP:  [5 cmH20] 5 cmH20 Pressure Support:  [12 cmH20] 12 cmH20 Plateau Pressure:  [21 cmH20-24 cmH20] 23 cmH20  INTAKE / OUTPUT: Intake/Output     03/28 0701 - 03/29 0700 03/29 0701 - 03/30 0700   I.V. (mL/kg) 1983.9 (21.5) 409 (4.4)   NG/GT 1210 225   Total Intake(mL/kg) 3193.9 (34.7) 634 (6.9)   Urine (mL/kg/hr) 20 (0)    Other 5348 (2.4) 1230 (2.1)   Stool 100 (0)    Total Output 5468 1230   Net -2274.1 -596          PHYSICAL EXAMINATION: Gen: arouses to voice, follows commands HEENT: NCAT, ETT PULM: Rhonchi bilaterally CV: RRR, no mgr AB: BS+, soft,nontender Ext: warm, some edema noted Neuro: Asleep but arouses to voice, follows commands   LABS:  Recent Labs Lab 09/16/13 0325 09/17/13 0500 09/18/13 0500  HGB 10.5* 10.1* 10.4*  HCT 30.6* 30.1* 29.7*  WBC 7.4 10.1 9.8  PLT 79* 74* 74*     Recent Labs Lab 09/14/13 0516  09/16/13 0325  09/17/13 0439 09/17/13   0500 09/17/13 1600 09/18/13 0500 09/18/13 0900 09/18/13 1600 09/19/13 0430  NA 146  < > 143  < > 139  --  134*  --  134* 131* 129*  K 3.5*  < > 3.2*  < > 3.6*  --  3.9  --  4.0 4.0 3.6*  CL 115*  < > 110  < > 104  --  101  --  98 96 95*  CO2 19  < > 19  < > 21  --  23  --  22 22 22  GLUCOSE 150*  < > 143*  < > 82  --  114*  --  139* 164* 190*  BUN 56*  < > 51*  < > 29*  --  22  --  20 21 23  CREATININE 3.57*  < > 2.76*  < > 1.61*  --  1.32*  --  1.19* 1.07 1.02  CALCIUM 7.9*  < > 7.5*  < > 7.2*  --  7.2*  --  7.3* 7.3* 7.1*  MG 1.9  --  2.1   --   --  2.1  --  2.3  --   --  2.2  PHOS 3.0  < > 2.7  < > 2.3  --  2.0*  --  2.2* 1.7* 1.6*  < > = values in this interval not displayed.   CXR:. Patchy increased nonspecific right lung base opacity   Moderate left pleural effusion suspected with left lower lobe  collapse or consolidation.   ASSESSMENT / PLAN:  PULMONARY A: Acute Respiratory Failure > extubation still limited by mental status, LLL PNA - strep Ag positive Suspect pulmonary edema.  P:   Continue full vent support Continue volume removal for pulm edema Daily SBT as indicated/wean as tolerated  Check sputum cx   CARDIOVASCULAR A:  HLD CHF Shock, septic, resolved; persistent low dose neo requirement> suspect sedation relate AFRVR, resolved P:  Monitor rhythm and BP Wean neo for MAP goal >65    RENAL A:   Acute on Chronic Renal Insufficiency, CVVHD Day #3 Lactic acidosis, resolved Hx Hypercalcemia due to MM - inactive problem P:   Monitor BMET intermittently Monitor I/Os Correct electrolytes as indicated CRRT per Renal service, continue volume removal  GASTROINTESTINAL A:   GERD Obesity Dysphagia P:   SUP: PPI per tube Resume TFs 3/27  HEMATOLOGIC A:   Multiple Myeloma, relapsed Thrombocytopenia Hx DVT - 2011 P:  DVT px: SQ hep and SCDs Monitor CBC intermittently Transfuse per usual ICU guidelines Holding full anticoagulation  INFECTIOUS A:   Pneumococcal PNA - treated -off abx since 3/25  Bacteriuria, asymptomatic 3/29 >afebrile and wbc ok  P:   Micro and abx as above Check cbc in am  Tr temp/wbc   ENDOCRINE A:   DM 2, hyperglycemia  Hypoglycemia>resolved on TF  P:   Cont SSI D/C D 10   NEUROLOGIC A:   Toxic - metabolic Encephalopathy poorly understood ? Impaired ventilatory drive P:   Minimize sedative/analgesics MRI brain when able to transport off floor   Code status: FULL    Tammy Parrett NP-C  [facility]  [phone]    ATtending:  I have seen and examined the patient with nurse practitioner/resident and agree with the note above.   Mental status seems improved today CXR images reviewed, unclear etiology > recurrent infiltrates without secretions after aggressive volume removal?  No fever to suggest pneumonia.   (eosinophilic process? HCAP?)    Send CBC Will perform bronchoscopy for culture, cytology, PCP, fungal, viral and cell count  CC time 35 minutes.  MCQUAID, DOUGLAS Grant PCCM Pager: [phone] Cell: [phone] If no response, call [phone]  

## 2013-09-20 ENCOUNTER — Inpatient Hospital Stay (HOSPITAL_COMMUNITY): Payer: Medicaid Other

## 2013-09-20 DIAGNOSIS — Y95 Nosocomial condition: Secondary | ICD-10-CM

## 2013-09-20 DIAGNOSIS — J189 Pneumonia, unspecified organism: Secondary | ICD-10-CM

## 2013-09-20 LAB — POCT I-STAT 3, ART BLOOD GAS (G3+)
Acid-base deficit: 4 mmol/L — ABNORMAL HIGH (ref 0.0–2.0)
Bicarbonate: 20.1 mEq/L (ref 20.0–24.0)
O2 Saturation: 94 %
PCO2 ART: 33.2 mmHg — AB (ref 35.0–45.0)
PO2 ART: 75 mmHg — AB (ref 80.0–100.0)
Patient temperature: 100.3
TCO2: 21 mmol/L (ref 0–100)
pH, Arterial: 7.394 (ref 7.350–7.450)

## 2013-09-20 LAB — RENAL FUNCTION PANEL
Albumin: 1.2 g/dL — ABNORMAL LOW (ref 3.5–5.2)
BUN: 42 mg/dL — ABNORMAL HIGH (ref 6–23)
CALCIUM: 6.4 mg/dL — AB (ref 8.4–10.5)
CHLORIDE: 96 meq/L (ref 96–112)
CO2: 17 meq/L — AB (ref 19–32)
Creatinine, Ser: 1.52 mg/dL — ABNORMAL HIGH (ref 0.50–1.10)
GFR calc Af Amer: 41 mL/min — ABNORMAL LOW (ref >90)
GFR calc non Af Amer: 35 mL/min — ABNORMAL LOW (ref >90)
GLUCOSE: 239 mg/dL — AB (ref 70–99)
Phosphorus: 3.8 mg/dL (ref 2.3–4.6)
Potassium: 4.6 mEq/L (ref 3.7–5.3)
SODIUM: 130 meq/L — AB (ref 137–147)

## 2013-09-20 LAB — PNEUMOCYSTIS JIROVECI SMEAR BY DFA: Pneumocystis jiroveci Ag: NEGATIVE

## 2013-09-20 LAB — GLUCOSE, CAPILLARY
GLUCOSE-CAPILLARY: 163 mg/dL — AB (ref 70–99)
GLUCOSE-CAPILLARY: 159 mg/dL — AB (ref 70–99)
Glucose-Capillary: 153 mg/dL — ABNORMAL HIGH (ref 70–99)
Glucose-Capillary: 165 mg/dL — ABNORMAL HIGH (ref 70–99)
Glucose-Capillary: 168 mg/dL — ABNORMAL HIGH (ref 70–99)
Glucose-Capillary: 174 mg/dL — ABNORMAL HIGH (ref 70–99)

## 2013-09-20 LAB — CBC
HEMATOCRIT: 31.5 % — AB (ref 36.0–46.0)
Hemoglobin: 11.2 g/dL — ABNORMAL LOW (ref 12.0–15.0)
MCH: 29.5 pg (ref 26.0–34.0)
MCHC: 35.6 g/dL (ref 30.0–36.0)
MCV: 82.9 fL (ref 78.0–100.0)
Platelets: 76 10*3/uL — ABNORMAL LOW (ref 150–400)
RBC: 3.8 MIL/uL — ABNORMAL LOW (ref 3.87–5.11)
RDW: 16.1 % — ABNORMAL HIGH (ref 11.5–15.5)
WBC: 3.9 10*3/uL — ABNORMAL LOW (ref 4.0–10.5)

## 2013-09-20 LAB — BASIC METABOLIC PANEL
BUN: 40 mg/dL — AB (ref 6–23)
CALCIUM: 7 mg/dL — AB (ref 8.4–10.5)
CO2: 18 mEq/L — ABNORMAL LOW (ref 19–32)
Chloride: 96 mEq/L (ref 96–112)
Creatinine, Ser: 1.57 mg/dL — ABNORMAL HIGH (ref 0.50–1.10)
GFR, EST AFRICAN AMERICAN: 39 mL/min — AB (ref >90)
GFR, EST NON AFRICAN AMERICAN: 34 mL/min — AB (ref >90)
GLUCOSE: 176 mg/dL — AB (ref 70–99)
Potassium: 4.1 mEq/L (ref 3.7–5.3)
SODIUM: 130 meq/L — AB (ref 137–147)

## 2013-09-20 LAB — PROCALCITONIN: Procalcitonin: 3.69 ng/mL

## 2013-09-20 MED ORDER — CEFTAZIDIME 2 G IJ SOLR
2.0000 g | Freq: Two times a day (BID) | INTRAMUSCULAR | Status: DC
  Administered 2013-09-20 – 2013-09-22 (×5): 2 g via INTRAVENOUS
  Filled 2013-09-20 (×6): qty 2

## 2013-09-20 MED ORDER — HEPARIN (PORCINE) 2000 UNITS/L FOR CRRT
INTRAVENOUS_CENTRAL | Status: DC | PRN
  Filled 2013-09-20: qty 1000

## 2013-09-20 MED ORDER — HEPARIN SODIUM (PORCINE) 1000 UNIT/ML DIALYSIS
1000.0000 [IU] | INTRAMUSCULAR | Status: DC | PRN
  Filled 2013-09-20: qty 6

## 2013-09-20 MED ORDER — PRISMASOL BGK 4/2.5 32-4-2.5 MEQ/L IV SOLN
INTRAVENOUS | Status: DC
  Administered 2013-09-20 – 2013-09-22 (×5): via INTRAVENOUS_CENTRAL
  Filled 2013-09-20 (×5): qty 5000

## 2013-09-20 MED ORDER — VANCOMYCIN HCL 10 G IV SOLR
1500.0000 mg | Freq: Once | INTRAVENOUS | Status: AC
  Administered 2013-09-20: 1500 mg via INTRAVENOUS
  Filled 2013-09-20: qty 1500

## 2013-09-20 MED ORDER — FAMOTIDINE 40 MG/5ML PO SUSR
20.0000 mg | Freq: Every day | ORAL | Status: DC
  Administered 2013-09-20 – 2013-09-22 (×3): 20 mg
  Filled 2013-09-20 (×3): qty 2.5

## 2013-09-20 MED ORDER — PRISMASOL BGK 4/2.5 32-4-2.5 MEQ/L IV SOLN
INTRAVENOUS | Status: DC
  Administered 2013-09-20 – 2013-09-21 (×2): via INTRAVENOUS_CENTRAL
  Filled 2013-09-20 (×3): qty 5000

## 2013-09-20 MED ORDER — VASOPRESSIN 20 UNIT/ML IJ SOLN
0.0300 [IU]/min | INTRAVENOUS | Status: DC
  Administered 2013-09-20 – 2013-09-21 (×2): 0.03 [IU]/min via INTRAVENOUS
  Filled 2013-09-20 (×2): qty 2.5

## 2013-09-20 MED ORDER — PRISMASOL BGK 4/2.5 32-4-2.5 MEQ/L IV SOLN
INTRAVENOUS | Status: DC
  Administered 2013-09-20 – 2013-09-22 (×15): via INTRAVENOUS_CENTRAL
  Filled 2013-09-20 (×22): qty 5000

## 2013-09-20 MED ORDER — VANCOMYCIN HCL IN DEXTROSE 1-5 GM/200ML-% IV SOLN
1000.0000 mg | INTRAVENOUS | Status: DC
  Administered 2013-09-21 – 2013-09-22 (×2): 1000 mg via INTRAVENOUS
  Filled 2013-09-20 (×2): qty 200

## 2013-09-20 MED ORDER — AMIODARONE HCL IN DEXTROSE 360-4.14 MG/200ML-% IV SOLN
60.0000 mg/h | INTRAVENOUS | Status: AC
  Administered 2013-09-20 (×2): 60 mg/h via INTRAVENOUS
  Filled 2013-09-20 (×2): qty 200

## 2013-09-20 MED ORDER — AMIODARONE LOAD VIA INFUSION
150.0000 mg | Freq: Once | INTRAVENOUS | Status: AC
  Administered 2013-09-20: 150 mg via INTRAVENOUS
  Filled 2013-09-20 (×2): qty 83.34

## 2013-09-20 MED ORDER — DEXTROSE 5 % IV SOLN
2.0000 ug/min | INTRAVENOUS | Status: DC
  Administered 2013-09-20: 20.053 ug/min via INTRAVENOUS
  Administered 2013-09-21: 50 ug/min via INTRAVENOUS
  Administered 2013-09-21: 39 ug/min via INTRAVENOUS
  Administered 2013-09-21 – 2013-09-22 (×5): 50 ug/min via INTRAVENOUS
  Filled 2013-09-20 (×9): qty 16

## 2013-09-20 MED ORDER — AMIODARONE HCL IN DEXTROSE 360-4.14 MG/200ML-% IV SOLN
30.0000 mg/h | INTRAVENOUS | Status: DC
  Administered 2013-09-20 – 2013-09-21 (×3): 30 mg/h via INTRAVENOUS
  Filled 2013-09-20 (×8): qty 200

## 2013-09-20 NOTE — Progress Notes (Signed)
PULMONARY / CRITICAL CARE MEDICINE   Name: [name] MRN: [id number] DOB: 21-Nov-1950    ADMISSION DATE:  08/24/2013 CONSULTATION DATE:  09/07/13  REFERRING MD :  Dr. Maryland Pink PRIMARY SERVICE: TRH-->PCCM  CHIEF COMPLAINT:  Acute Respiratory Failure  BRIEF PATIENT DESCRIPTION: 63 y/o F admitted with 3 wk hx of weakness, decreased appetite & difficulty swallowing.  Found to have positive strep antigen with concern for L retrocardiac infiltrate.  Progressed to hypercarbic respiratory failure, PCCM consulted.  SIGNIFICANT EVENTS / STUDIES:  3/16 Admit with 3 wk hx of weakness, decreased appetite & difficulty swallowing.  Found to have positive strep antigen with concern for L retrocardiac infiltrate.  3/17 Hypercarbic respiratory failure, intubated early am 3/17 ECHO:  mod LVH, nml systolic fxn, EF 29-51%, nml wall motion, grade II diastolic dysfunction  8/84  remains on vent, levophed & fentanyl gtt 3/20 afib with RVR overnight 3/23 failed SBT  3/24 failed SBT 3/24 renal US: Echogenic kidneys suggesting chronic medical renal disease. 3/24 Transferred to [facility] for CRRT 3/25 CRRT initiated 3/26 passed SBT and F/C. Extubated. No distress on recheck but very lethargic. Partially improved after naloxone 3/26 PM: reintubated for resp acidosis 3/29 Brain ZYS:AYTK narrowed appearance of lateral ventricles, within normal variation though, considering fluid distended sella can be seen with chronic intracranial hypertension. Differential diagnosis includes empty sella. Abnormal bone marrow signal corresponding to CT abnormality which may reflect patient's myeloma, though is nonspecific. 3/30 AM: AFRVR with hypotension > phenylephrine initiated 3/30 Recurrent septic shock thought due to HAP. PE changed to VP + NE  LINES / TUBES: Port-A-Cath 02/27/10 ETT 3/17 >> 3/26, 3/26 >>  L IJ TLC 3/17 >>  R femoral HD cath 3/24 >>  L radial A-line 3/26 >>   CULTURES: BCx2 3/16>>>neg RVP  3/16>>>neg Sputum 3/16>>>nml flora UC 3/16>>>neg UA 3/16>>>3-6 wbc, few bacteria.  Asymptomatic on admit. Strep AG 3/16>>>positive Resp 3/29 >>  Resp 3/30 >>  Pneumocystis DFA 3/30 >>   ANTIBIOTICS: Azithro 3/16>>>3/17 Cefepime 3/16>>>3/17 Rocephin 3/16>> 3/25 Vanc 3/30 >>  Ceftaz 3/30 >>   SUBJECTIVE:   Recurrent shock. Unresponsive  VITAL SIGNS: Temp:  [98 F (36.7 C)-100.3 F (37.9 C)] 98.5 F (36.9 C) (03/30 1530) Pulse Rate:  [77-183] 126 (03/30 1603) Resp:  [16-39] 38 (03/30 1800) BP: (49-138)/(14-112) 123/101 mmHg (03/30 1800) SpO2:  [84 %-100 %] 96 % (03/30 1603) Arterial Line BP: (49-138)/(40-88) 126/88 mmHg (03/30 1800) FiO2 (%):  [40 %-50 %] 50 % (03/30 1938) Weight:  [93.2 kg (205 lb 7.5 oz)] 93.2 kg (205 lb 7.5 oz) (03/30 0430)  HEMODYNAMICS: CVP:  [0 mmHg-10 mmHg] 10 mmHg  VENTILATOR SETTINGS: Vent Mode:  [-] PRVC FiO2 (%):  [40 %-50 %] 50 % Set Rate:  [16 bmp] 16 bmp Vt Set:  [440 mL-550 mL] 550 mL PEEP:  [5 cmH20] 5 cmH20 Plateau Pressure:  [21 cmH20-38 cmH20] 26 cmH20  INTAKE / OUTPUT: Intake/Output     03/30 0701 - 03/31 0700   I.V. (mL/kg) 1233.6 (13.2)   NG/GT 540   IV Piggyback 550   Total Intake(mL/kg) 2323.6 (24.9)   Other 278   Total Output 278   Net +2045.6         PHYSICAL EXAMINATION: Gen: unresponsive HEENT: NCAT, ETT PULM: Rhonchi bilaterally CV: tachy, regular, no M AB: BS+, soft,nontender Ext: warm, some edema noted Neuro: Asleep but arouses to voice, follows commands   LABS: I have reviewed all of today's lab results. Relevant abnormalities are discussed in  the A/P section   CXR:. 1. Increased airspace disease on the right, likely pneumonia based  on the appearance yesterday.  2. Unchanged left-sided opacity which could represent pleural fluid,  atelectasis, and/or pneumonia.  3. Pulmonary venous congestion    ASSESSMENT / PLAN:  PULMONARY A: Acute Respiratory Failure S/P failed extubation attempt  3/26 LLL PNA on admission Suspect pulmonary edema Suspected HAP P:   Cont full vent support - settings reviewed and/or adjusted Cont vent bundle Daily SBT if/when meets criteria  CARDIOVASCULAR A:  HLD CHF Shock, septic, recurrent AF/flutter with RVR, recurrent P:  Monitor rhythm and BP Cont vasopressors to maintain MAP > 65 mmHg  RENAL A:   Acute on Chronic Renal Insufficiency Lactic acidosis, resolved Hx Hypercalcemia due to MM - inactive problem P:   Monitor BMET intermittently Monitor I/Os Correct electrolytes as indicated CRRT per Renal service  GASTROINTESTINAL A:   GERD Obesity Dysphagia P:   SUP: PPI per tube Cont TFs   HEMATOLOGIC A:   Multiple Myeloma, relapsed Thrombocytopenia Hx DVT - 2011 P:  DVT px: SQ hep and SCDs Monitor CBC intermittently Transfuse per usual ICU guidelines Holding full anticoagulation  INFECTIOUS A:   Pneumococcal PNA, fully treated Bacteriuria, asymptomatic Severe sepsis, recurrent Suspect HAP 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 Impaired ventilatory drive P:   Cont PRN sedative/analgesics  Code status: FULL  Sister updated in detail over phone. Code status discussed. Remains full code  CCM X 40 mins  Merton Border, MD ; [facility] [phone].  After 5:30 PM or weekends, call [phone]

## 2013-09-20 NOTE — Progress Notes (Signed)
ANTIBIOTIC CONSULT NOTE - INITIAL  Pharmacy Consult for vancomycin, [name] Indication: pneumonia  Allergies  Allergen Reactions  . Ibuprofen Other (See Comments)    Patient does not remember.   . Morphine And Related Other (See Comments)    Patient does not remember reaction, believes it caused raised marks on skin.  . Other Rash    Tegaderm dressing  Patient Measurements: Height: _0  (162.6 cm) Weight: 205 lb 7.5 oz (93.2 kg) IBW/kg (Calculated) : 54.7 Vital Signs: Temp: 98.2 F (36.8 C) (03/30 0800) Temp src: Oral (03/30 0800) BP: 96/44 mmHg (03/30 0903) Pulse Rate: 144 (03/30 0903) Intake/Output from previous day: 03/29 0701 - 03/30 0700 In: 2681.3 [I.V.:1623.8; NG/GT:1057.5] Out: 1230  Labs:  Recent Labs  09/18/13 0500  09/18/13 1600 09/19/13 0430 09/20/13 0400  WBC 9.8  --   --   --  3.9*  HGB 10.4*  --   --   --  11.2*  PLT 74*  --   --   --  76*  CREATININE  --   < > 1.07 1.02 1.57*  < > = values in this interval not displayed. Estimated Creatinine Clearance: 40.6 ml/min (by C-G formula based on Cr of 1.57).  Microbiology: Recent Results (from the past 720 hour(s))  URINE CULTURE     Status: None   Collection Time    08/31/2013  6:21 PM      Result Value Ref Range Status   Specimen Description URINE, CATHETERIZED   Final   Special Requests NONE   Final   Culture  Setup Time     Final   Value: 09/07/2013 00:12     Performed at [facility]     Final   Value: NO GROWTH     Performed at [facility]   Culture     Final   Value: NO GROWTH     Performed at [facility]   Report Status 09/07/2013 FINAL   Final  MRSA PCR SCREENING     Status: None   Collection Time    08/23/2013 10:04 PM      Result Value Ref Range Status   MRSA by PCR NEGATIVE  NEGATIVE Final   Comment:            The GeneXpert MRSA Assay (FDA     approved for NASAL specimens     only), is one component of a     comprehensive MRSA colonization      surveillance program. It is not     intended to diagnose MRSA     infection nor to guide or     monitor treatment for     MRSA infections.  RESPIRATORY VIRUS PANEL     Status: None   Collection Time    08/31/2013 10:11 PM      Result Value Ref Range Status   Source - RVPAN NOSE   Final   Respiratory Syncytial Virus A NOT DETECTED   Final   Respiratory Syncytial Virus B NOT DETECTED   Final   Influenza A NOT DETECTED   Final   Influenza B NOT DETECTED   Final   Parainfluenza 1 NOT DETECTED   Final   Parainfluenza 2 NOT DETECTED   Final   Parainfluenza 3 NOT DETECTED   Final   Metapneumovirus NOT DETECTED   Final   Rhinovirus NOT DETECTED   Final   Adenovirus NOT DETECTED   Final  Influenza A H1 NOT DETECTED   Final   Influenza A H3 NOT DETECTED   Final   Comment: (NOTE)           Normal Reference Range for each Analyte: NOT DETECTED     Testing performed using the Luminex xTAG Respiratory Viral Panel test     kit.     This test was developed and its performance characteristics determined     by [facility]. It has not been cleared or approved by the [place]     Food and Drug Administration. This test is used for clinical purposes.     It should not be regarded as investigational or for research. This     laboratory is certified under the [place] (CLIA) as qualified to perform high complexity     clinical laboratory testing.     Performed at [place], BLOOD (ROUTINE X 2)     Status: None   Collection Time    08/27/2013 10:50 PM      Result Value Ref Range Status   Specimen Description BLOOD RIGHT ANTECUBITAL   Final   Special Requests BOTTLES DRAWN AEROBIC AND ANAEROBIC 5CC   Final   Culture  Setup Time     Final   Value: 09/07/2013 04:35     Performed at [facility]   Culture     Final   Value: NO GROWTH 5 DAYS     Performed at [facility]   Report Status 09/13/2013 FINAL   Final   CULTURE, BLOOD (ROUTINE X 2)     Status: None   Collection Time    09/12/2013 11:00 PM      Result Value Ref Range Status   Specimen Description BLOOD LEFT HAND   Final   Special Requests BOTTLES DRAWN AEROBIC ONLY 1CC   Final   Culture  Setup Time     Final   Value: 09/07/2013 04:36     Performed at [facility]   Culture     Final   Value: NO GROWTH 5 DAYS     Performed at [facility]   Report Status 09/13/2013 FINAL   Final  CULTURE, RESPIRATORY (NON-EXPECTORATED)     Status: None   Collection Time    09/07/13 11:32 AM      Result Value Ref Range Status   Specimen Description TRACHEAL ASPIRATE   Final   Special Requests Normal   Final   Gram Stain     Final   Value: ABUNDANT WBC PRESENT, PREDOMINANTLY PMN     RARE SQUAMOUS EPITHELIAL CELLS PRESENT     FEW GRAM POSITIVE COCCI     IN PAIRS     Performed at [facility]   Culture     Final   Value: Non-Pathogenic Oropharyngeal-type Flora Isolated.     Performed at [facility]   Report Status 09/09/2013 FINAL   Final    Medical History: Past Medical History  Diagnosis Date  . Small bowel obstruction   . Diabetes mellitus   . Hypertension   . Hyperlipidemia   . Chronic renal insufficiency   . Multiple myeloma   . Abdominal pain   . Ventral hernia   . Congestive heart failure   . GERD (gastroesophageal reflux disease)   . Renal insufficiency   . Obesity   . Depression   . Anxiety   .  Abscess     groin   Assessment: 55 YOF transferred from [place] on 3/25 with pancytopenia in setting of MM and positive strep antigen s/p treatment for HCAP. Now with new low-grade fever and neutropenia to restart antibiotics. CRRT restarting this AM.    Antibiotic history: Azithro 3/16>>>3/17  Cefepime 3/16>>>3/17  Rocephin 3/16>> 3/25 Vanc 3/30 >> [name] 3/30 >>  Goal of Therapy:  Vancomycin trough level 15-20 mcg/ml  Plan:  Fortaz 2g IV q12h while on CRRT. Vancomycin 1552m IV x1,  then 1g IV q24h while on CRRT.  Monitor CRRT versus intermittent HD plans.  Vancomycin levels as needed while on therapy.   JSloan Leiter PharmD, BCPS Clinical Pharmacist [phone]/30/2015,10:25 AM

## 2013-09-20 NOTE — Progress Notes (Signed)
[facility] Progress Note   Subjective:  CRRT held yesterday to allow possible reduction in pressors Developed AF with RVR BP remains 96-122 with HR's 130's No UOP Obligatory fluid intake with drps/fluids/feedings about 130+ cc/hour  Filed Vitals:   09/20/13 0645 09/20/13 0700 09/20/13 0715 09/20/13 0800  BP: 98/51 96/44    Pulse: 126 129 129   Temp:    98.2 F (36.8 C)  TempSrc:    Oral  Resp: 35 38 35   Height:      Weight:      SpO2: 98% 99% 99%   WEIGHT TRENDING 09/20/13 0430 93.2 kg     CRRT to restart 09/19/13 0530 92.1 kg     CRRT held 09/18/13 0500 97 kg   09/17/13 0500 103.1 kg   09/16/13 0500 106.1 kg 09/15/13 0146 106.4 kg   CRRT started 09/14/13 0700 101.7 kg   09/13/13 0400 103.3 kg   09/12/13 0100 102.2 kg   09/11/13 0000 101.4 kg   09/10/13 0600 98.8 kg   09/09/13 0400 99.1 kg   09/08/13 0400 93.6 kg  09/07/13 0400 87.8 kg   Exam: On vent Cant vis nec veins well. Chest coarse BS with tachypnea 40's Tachy to 130's Abd obese, nondistended  Minimal LE edema  Neuro not really opening eyes to voice right now  UA 1.023, 5.5, 0-2wbc, 3-6rbc, few bact UNa 30, UCr 65  FeNa 1.1% Renal US - 11 cm bilat, no hydro, ^echo ECHO mod LVH  EF 55%  DD2. RV mildly dilated w nl function CXR 3/30 IMPRESSION:  1. Increased airspace disease on the right, likely pneumonia based  on the appearance yesterday.  2. Unchanged left-sided opacity which could represent pleural fluid,  atelectasis, and/or pneumonia.  3. Pulmonary venous congestion   Recent Labs Lab 09/18/13 0900 09/18/13 1600 09/19/13 0430 09/20/13 0400  NA 134* 131* 129* 130*  K 4.0 4.0 3.6* 4.1  CL 98 96 95* 96  CO2 $Re'22 22 22 'huQ$ 18*  GLUCOSE 139* 164* 190* 176*  BUN $Re'20 21 23 'znN$ 40*  CREATININE 1.19* 1.07 1.02 1.57*  CALCIUM 7.3* 7.3* 7.1* 7.0*  PHOS 2.2* 1.7* 1.6*  --     Recent Labs Lab 09/18/13 0900 09/18/13 1600 09/19/13 0430  ALBUMIN 1.7* 1.5* 1.5*    Recent Labs Lab  09/17/13 0500 09/18/13 0500 09/20/13 0400  WBC 10.1 9.8 3.9*  HGB 10.1* 10.4* 11.2*  HCT 30.1* 29.7* 31.5*  MCV 85.5 83.4 82.9  PLT 74* 74* 76*   . antiseptic oral rinse  15 mL Mouth Rinse QID  . chlorhexidine  15 mL Mouth Rinse BID  . feeding supplement (PRO-STAT SUGAR FREE 64)  30 mL Per Tube TID  . heparin subcutaneous  5,000 Units Subcutaneous 3 times per day  . hydrocortisone sod succinate (SOLU-CORTEF) inj  50 mg Intravenous Q8H  . magic mouthwash w/lidocaine  5 mL Oral QID   . sodium chloride 10 mL/hr (09/17/13 1900)  . amiodarone (NEXTERONE PREMIX) 360 mg/200 mL dextrose 30 mg/hr (09/20/13 0700)  . feeding supplement (VITAL HIGH PROTEIN) 1,000 mL (09/19/13 2250)  . phenylephrine (NEO-SYNEPHRINE) Adult infusion 140 mcg/min (09/20/13 0600)   albuterol, fentaNYL, heparin, lidocaine-prilocaine, metoprolol, sodium chloride   Assessment:  63 y.o. year-old with hx of obesity, HTN, DM and multiple myeloma dx'd in 2008. Currently she is on treatment break, last cycle finished in Jan 2015 (Kyprolis). Pt presented on 3/16 w weakness, anorexia, swallowing difficulty and LLL infiltrate. Progressed to resp failure,  intubated on 3/17. Had +strep antigen. ECHO was normal. Was in shock on pressors, vent, fentanyl on 3/18. Failed attempt to wean off vent on 3/23. Creat climed 1.17 to 3.57 with pulm edema, elevated CVP's 30's, increase weight 88->101 kg; CRRT 3/25-3/29 and held 3/29 to allow BP to drift up but in interim AF with RVR->amio and persistent hypotension.   Obligatory intake with drips/feeds/meds 130+ ml/hour so will need to resume CRRT  1 Acute renal failure- ATN d/t shock, oliguric, had 3 days CRRT and was held yesterday to hopefully allow BP to drift up but with AF now and not making urine, BP not better.  Feel need to resume to take care of obligatory intake, not necessarily remove any volume right now. Still not making urine. K fine, last phos 1.6 recheck with next labs 2 Vol  overload severe / pulm edema: CXR worsening past 24 hours 3 PNA / VDRF- off abx; remains vent dependent 4 Multiple myeloma  5 Shock - on neo gtt still 6 Anemia Hb 11's 7 DM on insulin SSI  8 Nutrition on TF's 9 AF with RVR amio 10 Hypophos - repeat labs; replete if still low; drops with CRRT

## 2013-09-20 NOTE — Progress Notes (Signed)
eLink Physician-Brief Progress Note Patient Name: [name] DOB: 1951-02-02 MRN: [id number]  Date of Service  09/20/2013   HPI/Events of Note   A Flutter/Fib with hypotension despite pressors. HR 150s  eICU Interventions  Start IV amio gtt   Intervention Category Major Interventions: Arrhythmia - evaluation and management  Tameaka Eichhorn 09/20/2013, 12:53 AM

## 2013-09-20 NOTE — Progress Notes (Signed)
UR Completed.  [name] [id number] 09/20/2013

## 2013-09-20 NOTE — Progress Notes (Signed)
  Amiodarone Drug - Drug Interaction Consult Note  Recommendations: Only drug interaction noted with prn metoprolol.  Monitor for bradycardia. Amiodarone is metabolized by the cytochrome P450 system and therefore has the potential to cause many drug interactions. Amiodarone has an average plasma half-life of 50 days (range 20 to 100 days).   There is potential for drug interactions to occur several weeks or months after stopping treatment and the onset of drug interactions may be slow after initiating amiodarone.   []  Statins: Increased risk of myopathy. Simvastatin- restrict dose to 20mg  daily. Other statins: counsel patients to report any muscle pain or weakness immediately.  []  Anticoagulants: Amiodarone can increase anticoagulant effect. Consider warfarin dose reduction. Patients should be monitored closely and the dose of anticoagulant altered accordingly, remembering that amiodarone levels take several weeks to stabilize.  []  Antiepileptics: Amiodarone can increase plasma concentration of phenytoin, the dose should be reduced. Note that small changes in phenytoin dose can result in large changes in levels. Monitor patient and counsel on signs of toxicity.  [x]  Beta blockers: increased risk of bradycardia, AV block and myocardial depression. Sotalol - avoid concomitant use.  []   Calcium channel blockers (diltiazem and verapamil): increased risk of bradycardia, AV block and myocardial depression.  []   Cyclosporine: Amiodarone increases levels of cyclosporine. Reduced dose of cyclosporine is recommended.  []  Digoxin dose should be halved when amiodarone is started.  []  Diuretics: increased risk of cardiotoxicity if hypokalemia occurs.  []  Oral hypoglycemic agents (glyburide, glipizide, glimepiride): increased risk of hypoglycemia. Patient's glucose levels should be monitored closely when initiating amiodarone therapy.   []  Drugs that prolong the QT interval:  Torsades de pointes risk  may be increased with concurrent use - avoid if possible.  Monitor QTc, also keep magnesium/potassium WNL if concurrent therapy can't be avoided. Marland Kitchen Antibiotics: e.g. fluoroquinolones, erythromycin. . Antiarrhythmics: e.g. quinidine, procainamide, disopyramide, sotalol. . Antipsychotics: e.g. phenothiazines, haloperidol.  . Lithium, tricyclic antidepressants, and methadone. Thank You,  Excell Seltzer Poteet  09/20/2013 12:56 AM

## 2013-09-21 LAB — CBC
HEMATOCRIT: 31.3 % — AB (ref 36.0–46.0)
HEMOGLOBIN: 10.5 g/dL — AB (ref 12.0–15.0)
MCH: 29 pg (ref 26.0–34.0)
MCHC: 33.5 g/dL (ref 30.0–36.0)
MCV: 86.5 fL (ref 78.0–100.0)
Platelets: 84 10*3/uL — ABNORMAL LOW (ref 150–400)
RBC: 3.62 MIL/uL — ABNORMAL LOW (ref 3.87–5.11)
RDW: 17 % — AB (ref 11.5–15.5)
WBC: 4.4 10*3/uL (ref 4.0–10.5)

## 2013-09-21 LAB — RENAL FUNCTION PANEL
Albumin: 1.3 g/dL — ABNORMAL LOW (ref 3.5–5.2)
Albumin: 1.5 g/dL — ABNORMAL LOW (ref 3.5–5.2)
BUN: 36 mg/dL — ABNORMAL HIGH (ref 6–23)
BUN: 27 mg/dL — ABNORMAL HIGH (ref 6–23)
CHLORIDE: 92 meq/L — AB (ref 96–112)
CO2: 14 mEq/L — ABNORMAL LOW (ref 19–32)
CO2: 12 meq/L — AB (ref 19–32)
CREATININE: 1.06 mg/dL (ref 0.50–1.10)
Calcium: 7 mg/dL — ABNORMAL LOW (ref 8.4–10.5)
Calcium: 6.2 mg/dL — CL (ref 8.4–10.5)
Chloride: 90 mEq/L — ABNORMAL LOW (ref 96–112)
Creatinine, Ser: 1.26 mg/dL — ABNORMAL HIGH (ref 0.50–1.10)
GFR calc Af Amer: 63 mL/min — ABNORMAL LOW (ref >90)
GFR calc non Af Amer: 44 mL/min — ABNORMAL LOW (ref >90)
GFR calc non Af Amer: 55 mL/min — ABNORMAL LOW (ref >90)
GFR, EST AFRICAN AMERICAN: 51 mL/min — AB (ref >90)
GLUCOSE: 172 mg/dL — AB (ref 70–99)
GLUCOSE: 109 mg/dL — AB (ref 70–99)
POTASSIUM: 4.6 meq/L (ref 3.7–5.3)
Phosphorus: 3.9 mg/dL (ref 2.3–4.6)
Phosphorus: 4.7 mg/dL — ABNORMAL HIGH (ref 2.3–4.6)
Potassium: 5.5 mEq/L — ABNORMAL HIGH (ref 3.7–5.3)
Sodium: 129 mEq/L — ABNORMAL LOW (ref 137–147)
Sodium: 128 mEq/L — ABNORMAL LOW (ref 137–147)

## 2013-09-21 LAB — GLUCOSE, CAPILLARY
GLUCOSE-CAPILLARY: 106 mg/dL — AB (ref 70–99)
GLUCOSE-CAPILLARY: 76 mg/dL (ref 70–99)
Glucose-Capillary: 101 mg/dL — ABNORMAL HIGH (ref 70–99)
Glucose-Capillary: 116 mg/dL — ABNORMAL HIGH (ref 70–99)
Glucose-Capillary: 132 mg/dL — ABNORMAL HIGH (ref 70–99)
Glucose-Capillary: 147 mg/dL — ABNORMAL HIGH (ref 70–99)
Glucose-Capillary: 41 mg/dL — CL (ref 70–99)

## 2013-09-21 LAB — POCT I-STAT 3, ART BLOOD GAS (G3+)
Acid-base deficit: 18 mmol/L — ABNORMAL HIGH (ref 0.0–2.0)
BICARBONATE: 12 meq/L — AB (ref 20.0–24.0)
O2 SAT: 88 %
PCO2 ART: 42.5 mmHg (ref 35.0–45.0)
Patient temperature: 97.6
TCO2: 13 mmol/L (ref 0–100)
pH, Arterial: 7.055 — CL (ref 7.350–7.450)
pO2, Arterial: 76 mmHg — ABNORMAL LOW (ref 80.0–100.0)

## 2013-09-21 LAB — PROCALCITONIN: Procalcitonin: 4.56 ng/mL

## 2013-09-21 LAB — MAGNESIUM: Magnesium: 2.4 mg/dL (ref 1.5–2.5)

## 2013-09-21 LAB — LACTIC ACID, PLASMA: Lactic Acid, Venous: 11.3 mmol/L — ABNORMAL HIGH (ref 0.5–2.2)

## 2013-09-21 MED ORDER — STERILE WATER FOR INJECTION IV SOLN
150.0000 meq | INTRAVENOUS | Status: DC
  Administered 2013-09-21 – 2013-09-22 (×4): 150 meq via INTRAVENOUS
  Filled 2013-09-21 (×7): qty 850

## 2013-09-21 MED ORDER — ALBUMIN HUMAN 5 % IV SOLN
12.5000 g | Freq: Once | INTRAVENOUS | Status: AC
  Administered 2013-09-21: 12.5 g via INTRAVENOUS
  Filled 2013-09-21: qty 500

## 2013-09-21 MED ORDER — SODIUM CHLORIDE 0.9 % IV SOLN
50.0000 ug/h | INTRAVENOUS | Status: DC
  Administered 2013-09-21: 50 ug/h via INTRAVENOUS
  Filled 2013-09-21: qty 50

## 2013-09-21 MED ORDER — HYDROCORTISONE NA SUCCINATE PF 100 MG IJ SOLR
100.0000 mg | Freq: Three times a day (TID) | INTRAMUSCULAR | Status: DC
  Administered 2013-09-21 – 2013-09-22 (×4): 100 mg via INTRAVENOUS
  Filled 2013-09-21 (×6): qty 2

## 2013-09-21 NOTE — Progress Notes (Signed)
Thora Lance, pt's sister notified of reaching maximum rates on pressors and update on patient's overall decline. Questions answered. Mrs Folkes will attempt to come see patient today.

## 2013-09-21 NOTE — Progress Notes (Signed)
[facility] Progress Note   Subjective:  CRRT restarted to keep up with obligatory fluid intake from meds/infusions yesterday Pt with progressive shock Now on max presors with BP in the 77-82 systolic range and mottling of extremities from the thighs down She remains full code, sister coming in  [place]:   09/21/13 0645 09/21/13 0700 09/21/13 0800 09/21/13 0815  BP: 79/51 63/45  48/29  Pulse:    126  Temp:   97.6 F (36.4 C)   TempSrc:   Oral   Resp: $Remo'28 29  27  'IKivB$ Height:      Weight:      SpO2:      WEIGHT TRENDING 09/21/13 0400 94.3 kg  09/20/13 0430 93.2 kg     CRRT restarted 09/19/13 0530 92.1 kg     CRRT held 09/18/13 0500 97 kg   09/17/13 0500 103.1 kg   09/16/13 0500 106.1 kg 09/15/13 0146 106.4 kg   CRRT started 09/14/13 0700 101.7 kg   09/13/13 0400 103.3 kg   09/12/13 0100 102.2 kg   09/11/13 0000 101.4 kg   09/10/13 0600 98.8 kg   09/09/13 0400 99.1 kg   09/08/13 0400 93.6 kg  09/07/13 0400 87.8 kg   Exam: On vent Cant vis nec veins well. Chest coarse BS with tachypnea 40's Tachy to 130's Abd obese, nondistended  Minimal LE edema; both legs mottled from the upper thigh region down Neuro unresponsive  UA 1.023, 5.5, 0-2wbc, 3-6rbc, few bact UNa 30, UCr 65  FeNa 1.1% Renal US - 11 cm bilat, no hydro, ^echo ECHO mod LVH  EF 55%  DD2. RV mildly dilated w nl function CXR 3/30 IMPRESSION:  1. Increased airspace disease on the right, likely pneumonia based  on the appearance yesterday.  2. Unchanged left-sided opacity which could represent pleural fluid,  atelectasis, and/or pneumonia.  3. Pulmonary venous congestion   Recent Labs Lab 09/19/13 0430 09/20/13 0400 09/20/13 1600 09/21/13 0420  NA 129* 130* 130* 129*  K 3.6* 4.1 4.6 4.6  CL 95* 96 96 92*  CO2 22 18* 17* 14*  GLUCOSE 190* 176* 239* 172*  BUN 23 40* 42* 36*  CREATININE 1.02 1.57* 1.52* 1.26*  CALCIUM 7.1* 7.0* 6.4* 7.0*  PHOS 1.6*  --  3.8 3.9    Recent  Labs Lab 09/19/13 0430 09/20/13 1600 09/21/13 0420  ALBUMIN 1.5* 1.2* 1.3*    Recent Labs Lab 09/17/13 0500 09/18/13 0500 09/20/13 0400  WBC 10.1 9.8 3.9*  HGB 10.1* 10.4* 11.2*  HCT 30.1* 29.7* 31.5*  MCV 85.5 83.4 82.9  PLT 74* 74* 76*   . antiseptic oral rinse  15 mL Mouth Rinse QID  . cefTAZidime (FORTAZ)  IV  2 g Intravenous Q12H  . chlorhexidine  15 mL Mouth Rinse BID  . famotidine  20 mg Per Tube Daily  . feeding supplement (PRO-STAT SUGAR FREE 64)  30 mL Per Tube TID  . heparin subcutaneous  5,000 Units Subcutaneous 3 times per day  . hydrocortisone sod succinate (SOLU-CORTEF) inj  50 mg Intravenous Q8H  . magic mouthwash w/lidocaine  5 mL Oral QID  . vancomycin  1,000 mg Intravenous Q24H   . sodium chloride 10 mL/hr at 09/20/13 2152  . amiodarone (NEXTERONE PREMIX) 360 mg/200 mL dextrose 30 mg/hr (09/21/13 0631)  . feeding supplement (VITAL HIGH PROTEIN) 1,000 mL (09/20/13 2000)  . norepinephrine (LEVOPHED) Adult infusion 50 mcg/min (09/21/13 0545)  . phenylephrine (NEO-SYNEPHRINE) Adult infusion 200 mcg/min (09/21/13 0616)  .  dialysis replacement fluid (prismasate) 300 mL/hr at 09/21/13 0258  . dialysis replacement fluid (prismasate) 200 mL/hr at 09/20/13 1203  . dialysate (PRISMASATE) 1,500 mL/hr at 09/21/13 0530  . vasopressin (PITRESSIN) infusion - *FOR SHOCK* 0.03 Units/min (09/21/13 0600)   albuterol, fentaNYL, heparin, heparin, lidocaine-prilocaine, metoprolol, sodium chloride   Assessment:  63 y.o. year-old with hx of obesity, HTN, DM and multiple myeloma dx'd in 2008. Currently she is on treatment break, last cycle finished in Jan 2015 (Kyprolis). Pt presented on 3/16 w weakness, anorexia, swallowing difficulty and LLL infiltrate. Progressed to resp failure, intubated on 3/17. Had +strep antigen. ECHO was normal. Was in shock on pressors, vent, fentanyl on 3/18. Failed attempt to wean off vent on 3/23. Creat climed 1.17 to 3.57 with pulm edema, elevated  CVP's 30's, increase weight 88->101 kg; CRRT 3/25-3/29 and held 3/29 to allow BP to drift up but in interim AF with RVR->amio and persistent hypotension.   Obligatory intake with drips/feeds/meds 130+ ml/hour so CRRT resumed  3/30.  Shock has progressed.  1 Acute renal failure- ATN d/t shock, oliguric, on CRRT simply keeping even; shock is progressive, CO2 dropping/gap widening; check ABG and lactic acid; Start bicarb drip pending decision by primary service (CCM) as to how hard to keep pushing.   2 Vol overload severe / pulm edema: CXR worsened 3/30 3 PNA / VDRF- vanco/fortaz 4 Multiple myeloma  5 Shock - on max pressors now 6 Anemia Hb 11's 7 DM on insulin SSI  8 Nutrition on TF's 9 AF with RVR amio 10 Anticipate decisions shortly about level of care to be continued given her sig deterioration  Past 24 hours

## 2013-09-21 NOTE — Significant Event (Signed)
CRITICAL VALUE ALERT  Critical value received: Calcium 6.2  Date of notification:  Sep 21, 2012  Time of notification: 1603  Critical value read back: yes  Nurse who received alert:  Lenor Coffin, RN  MD notified (1st page):  Dr Lorrene Reid  Time of first page:  1605  Responding MD:  Dr Lorrene Reid  Time MD responded: 1606; also notified of potassium 5.5; no orders received

## 2013-09-21 NOTE — Progress Notes (Signed)
PULMONARY / CRITICAL CARE MEDICINE   Name: [name] MRN: [id number] DOB: 1950/12/06    ADMISSION DATE:  09/07/2013 CONSULTATION DATE:  09/07/13  REFERRING MD :  Dr. Maryland Pink PRIMARY SERVICE: TRH-->PCCM  CHIEF COMPLAINT:  Acute Respiratory Failure  BRIEF PATIENT DESCRIPTION: 63 y/o F admitted with 3 wk hx of weakness, decreased appetite & difficulty swallowing.  Found to have positive strep antigen with concern for L retrocardiac infiltrate.  Progressed to hypercarbic respiratory failure, PCCM consulted.  SIGNIFICANT EVENTS / STUDIES:  3/16 Admit with 3 wk hx of weakness, decreased appetite & difficulty swallowing.  Found to have positive strep antigen with concern for L retrocardiac infiltrate.  3/17 Hypercarbic respiratory failure, intubated early am 3/17 ECHO:  mod LVH, nml systolic fxn, EF 41-96%, nml wall motion, grade II diastolic dysfunction  2/22  remains on vent, levophed & fentanyl gtt 3/20 afib with RVR overnight 3/23 failed SBT  3/24 failed SBT 3/24 renal US: Echogenic kidneys suggesting chronic medical renal disease. 3/24 Transferred to [facility] for CRRT 3/25 CRRT initiated 3/26 passed SBT and F/C. Extubated. No distress on recheck but very lethargic. Partially improved after naloxone 3/26 PM: reintubated for resp acidosis 3/29 Brain LNL:GXQJ narrowed appearance of lateral ventricles, within normal variation though, considering fluid distended sella can be seen with chronic intracranial hypertension. Differential diagnosis includes empty sella. Abnormal bone marrow signal corresponding to CT abnormality which may reflect patient's myeloma, though is nonspecific. 3/30 AM: AFRVR with hypotension > phenylephrine initiated 3/30 Recurrent septic shock thought due to HAP. PE changed to VP + NE 3/31 Refractory septic shock and worsening lactic acidosis. Family apprised of grave situation. Continue full aggressive medical care but DNR if these efforts fail  LINES /  TUBES: Port-A-Cath 02/27/10 ETT 3/17 >> 3/26, 3/26 >>  L IJ TLC 3/17 >>  R femoral HD cath 3/24 >>  L radial A-line 3/26 >>   CULTURES: BCx2 3/16>>>neg RVP 3/16>>>neg Sputum 3/16>>>nml flora UC 3/16 >> neg UA 3/16 >> 3-6 wbc, few bacteria.  Asymptomatic on admit. Strep AG 3/16 >> positive Resp 3/29 >> NOF Resp 3/30 >> Mod GPC pairs Pneumocystis DFA 3/30 >> NEG  ANTIBIOTICS: Azithro 3/16>>>3/17 Cefepime 3/16>>>3/17 Rocephin 3/16>> 3/25 Vanc 3/30 >>  Ceftaz 3/30 >>   SUBJECTIVE:   Refractory shock despite max dose vasopressors. Comatose  VITAL SIGNS: Temp:  [95.3 F (35.2 C)-98.5 F (36.9 C)] 97.6 F (36.4 C) (03/31 0800) Pulse Rate:  [25-129] 104 (03/31 1201) Resp:  [16-38] 25 (03/31 1400) BP: (42-123)/(14-101) 83/39 mmHg (03/31 1400) SpO2:  [86 %-100 %] 98 % (03/31 1200) Arterial Line BP: (30-126)/(26-88) 35/30 mmHg (03/31 1200) FiO2 (%):  [50 %] 50 % (03/31 1202) Weight:  [94.3 kg (207 lb 14.3 oz)] 94.3 kg (207 lb 14.3 oz) (03/31 0400)  HEMODYNAMICS: CVP:  [10 mmHg] 10 mmHg  VENTILATOR SETTINGS: Vent Mode:  [-] PRVC FiO2 (%):  [50 %] 50 % Set Rate:  [16 bmp] 16 bmp Vt Set:  [440 mL-550 mL] 440 mL PEEP:  [5 cmH20] 5 cmH20 Plateau Pressure:  [26 cmH20-38 cmH20] 30 cmH20  INTAKE / OUTPUT: Intake/Output     03/30 0701 - 03/31 0700 03/31 0701 - 04/01 0700   I.V. (mL/kg) 2924.7 (31) 1779.7 (18.9)   NG/GT 1180 165   IV Piggyback 600 500   Total Intake(mL/kg) 4704.7 (49.9) 2444.7 (25.9)   Emesis/NG output  1000   Other 2542 2229   Stool 200    Total Output 2742 3229  Net +1962.7 -784.3          PHYSICAL EXAMINATION: Gen: unresponsive HEENT: NCAT, ETT PULM: Rhonchi bilaterally CV: tachy, regular, no M AB: BS+, soft,nontender Ext: warm, some edema noted Neuro: Asleep but arouses to voice, follows commands   LABS: I have reviewed all of today's lab results. Relevant abnormalities are discussed in the A/P section   CXR:. NNF   ASSESSMENT /  PLAN:  PULMONARY A: Acute Respiratory Failure S/P failed extubation attempt 3/26 LLL PNA on admission Suspect pulmonary edema Suspected HAP P:   Cont full vent support - settings reviewed and/or adjusted Cont vent bundle Daily SBT if/when meets criteria  CARDIOVASCULAR A:  HLD CHF Shock, septic, recurrent and refractory AF/flutter with RVR, recurrent P:  Monitor rhythm and BP Cont vasopressors to maintain MAP > 65 mmHg  RENAL A:   Acute on Chronic Renal Insufficiency Lactic acidosis, recurrent Hx Hypercalcemia due to MM - inactive problem P:   Monitor BMET intermittently Monitor I/Os Correct electrolytes as indicated CRRT per Renal service  GASTROINTESTINAL A:   GERD Obesity Dysphagia P:   SUP: PPI per tube Cont TFs   HEMATOLOGIC A:   Multiple Myeloma, relapsed Thrombocytopenia Hx DVT - 2011 P:  DVT px: SQ hep and SCDs Monitor CBC intermittently Transfuse per usual ICU guidelines Holding full anticoagulation  INFECTIOUS A:   Pneumococcal PNA, fully treated Bacteriuria, asymptomatic Severe sepsis, recurrent Suspect HAP 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 Impaired ventilatory drive P:   Cont PRN sedative/analgesics  Code status: DNR  Sister and 2 nieces updated @ bedside. I explained that we have instituted all potential therapies with vent support, CRRT and maximum dose vasopressors. Despite this, we have not been able to stabilize pt. Cardiac arrest is the inevitable end point of refractory shock. There would be no medically rational reason to undertake CPR/ACLS in this setting. Based on medical futility, I have made her DNR in event of cardiac arrest. We will not withdraw any of our current efforts. Fentanyl infusion has been ordered to ensure comfort  CCM X 45 mins  Merton Border, MD ; [facility] [phone].  After 5:30 PM or weekends, call [phone]

## 2013-09-22 ENCOUNTER — Inpatient Hospital Stay (HOSPITAL_COMMUNITY): Payer: Medicaid Other

## 2013-09-22 LAB — COMPREHENSIVE METABOLIC PANEL
ALBUMIN: 1.4 g/dL — AB (ref 3.5–5.2)
ALK PHOS: 198 U/L — AB (ref 39–117)
ALT: 5001 U/L — ABNORMAL HIGH (ref 0–35)
AST: >10000 U/L — ABNORMAL HIGH (ref 0–37)
BUN: 21 mg/dL (ref 6–23)
CHLORIDE: 88 meq/L — AB (ref 96–112)
CO2: 13 mEq/L — ABNORMAL LOW (ref 19–32)
Calcium: 6.1 mg/dL — CL (ref 8.4–10.5)
Creatinine, Ser: 0.83 mg/dL (ref 0.50–1.10)
GFR calc Af Amer: 85 mL/min — ABNORMAL LOW (ref >90)
GFR calc non Af Amer: 73 mL/min — ABNORMAL LOW (ref >90)
Glucose, Bld: 98 mg/dL (ref 70–99)
POTASSIUM: 5.1 meq/L (ref 3.7–5.3)
SODIUM: 126 meq/L — AB (ref 137–147)
Total Bilirubin: 1.3 mg/dL — ABNORMAL HIGH (ref 0.3–1.2)
Total Protein: 6.3 g/dL (ref 6.0–8.3)

## 2013-09-22 LAB — CULTURE, RESPIRATORY

## 2013-09-22 LAB — CBC
HEMATOCRIT: 29.9 % — AB (ref 36.0–46.0)
Hemoglobin: 10.1 g/dL — ABNORMAL LOW (ref 12.0–15.0)
MCH: 29 pg (ref 26.0–34.0)
MCHC: 33.8 g/dL (ref 30.0–36.0)
MCV: 85.9 fL (ref 78.0–100.0)
Platelets: 57 10*3/uL — ABNORMAL LOW (ref 150–400)
RBC: 3.48 MIL/uL — ABNORMAL LOW (ref 3.87–5.11)
RDW: 17 % — AB (ref 11.5–15.5)
WBC: 6.4 10*3/uL (ref 4.0–10.5)

## 2013-09-22 LAB — GLUCOSE, CAPILLARY
GLUCOSE-CAPILLARY: 117 mg/dL — AB (ref 70–99)
GLUCOSE-CAPILLARY: 67 mg/dL — AB (ref 70–99)
GLUCOSE-CAPILLARY: 89 mg/dL (ref 70–99)
GLUCOSE-CAPILLARY: 91 mg/dL (ref 70–99)
Glucose-Capillary: 111 mg/dL — ABNORMAL HIGH (ref 70–99)

## 2013-09-22 LAB — MAGNESIUM: Magnesium: 2.4 mg/dL (ref 1.5–2.5)

## 2013-09-22 LAB — RENAL FUNCTION PANEL
ALBUMIN: 1.3 g/dL — AB (ref 3.5–5.2)
BUN: 14 mg/dL (ref 6–23)
CHLORIDE: 84 meq/L — AB (ref 96–112)
CO2: 14 meq/L — AB (ref 19–32)
CREATININE: 0.66 mg/dL (ref 0.50–1.10)
Calcium: 5.9 mg/dL — CL (ref 8.4–10.5)
GFR calc Af Amer: >90 mL/min (ref >90)
GFR calc non Af Amer: >90 mL/min (ref >90)
Glucose, Bld: 85 mg/dL (ref 70–99)
POTASSIUM: 5.6 meq/L — AB (ref 3.7–5.3)
Phosphorus: 5.8 mg/dL — ABNORMAL HIGH (ref 2.3–4.6)
Sodium: 124 mEq/L — ABNORMAL LOW (ref 137–147)

## 2013-09-22 LAB — PROCALCITONIN: Procalcitonin: 5.03 ng/mL

## 2013-09-22 LAB — CULTURE, RESPIRATORY W GRAM STAIN

## 2013-09-22 MED ORDER — DEXTROSE 50 % IV SOLN
25.0000 mL | Freq: Once | INTRAVENOUS | Status: AC | PRN
  Administered 2013-09-22 (×2): 25 mL via INTRAVENOUS

## 2013-09-22 MED ORDER — STERILE WATER FOR INJECTION IV SOLN
150.0000 meq | INTRAVENOUS | Status: DC
  Administered 2013-09-22 (×2): 150 meq via INTRAVENOUS
  Filled 2013-09-22 (×5): qty 850

## 2013-09-22 MED ORDER — DEXTROSE 50 % IV SOLN
INTRAVENOUS | Status: AC
  Administered 2013-09-22: 25 mL via INTRAVENOUS
  Filled 2013-09-22: qty 50

## 2013-09-22 DEATH — deceased

## 2013-10-07 NOTE — Discharge Summary (Signed)
DEATH SUMMARY  DATE OF ADMISSION:  2013/09/15  DATE OF DISCHARGE/DEATH:  01-Oct-2013  ADMISSION DIAGNOSES:   Sepsis secondary to healthcare associated pneumonia  Acute encephalopathy  Bacteriuria  Dysphagia  Generalized weakness Multiple myeloma with thrombocytopenia and hypercalcemia  Congestive heart failure  Anxiety/depression  Diabetes mellitus  Hypertension    DISCHARGE DIAGNOSES:   Acute Respiratory Failure  S/P failed extubation attempt 3/26  LLL Pneumococcal PNA on admission, fully treated  Suspect pulmonary edema  Suspected HAP  Hyperlipidemia CHF  Shock, septic, recurrent  Severe sepsis, recurrent  AF/flutter with RVR, recurrent  Acute on Chronic Renal Insufficiency  Lactic acidosis, recurrent  Hx Hypercalcemia due to MM - inactive problem  GERD Obesity  Dysphagia  Multiple Myeloma, relapsed  Thrombocytopenia  Hx DVT - 2011  Bacteriuria, asymptomatic  Suspect HAP  Immunocompromised due to MM and immunosuppression  DM 2, hyperglycemia  Hypoglycemia, resolved  TME unclear etiology  Impaired ventilatory drive   PRESENTATION:   Pt was admitted with the following HPI and the above admission diagnoses:  [name] is a 63 y.o. female With a history of multiple myeloma, CHF, depression and anxiety presents to the emergency department for weakness and fatigue. Patient states that she has been feeling increasingly tired over the past 3 weeks. She also states that she's been unable to eat or drink anything without feeling nauseous or vomit. Patient states she's been having trouble swallowing. Over the last month, patient has been somewhat confused and less responsive according to report given to the ER physician. At this time she is able to answer all questions appropriately and appears to be at her baseline. Patient denies any shortness of breath, chest pain, cough at this time. She only complains of the inability to speak. Patient denies any fever, chills, sick or  ill contacts, recent travel.   HOSPITAL COURSE:   SIGNIFICANT EVENTS / STUDIES:  2022-09-16 Admit with 3 wk hx of weakness, decreased appetite & difficulty swallowing. Found to have positive strep antigen with concern for L retrocardiac infiltrate.  3/17 Hypercarbic respiratory failure, intubated early am  3/17 ECHO: mod LVH, nml systolic fxn, EF 74-25%, nml wall motion, grade II diastolic dysfunction  9/56 remains on vent, levophed & fentanyl gtt  3/20 afib with RVR overnight  3/23 failed SBT  3/24 failed SBT  3/24 renal US: Echogenic kidneys suggesting chronic medical renal disease.  3/24 Transferred to [facility] for CRRT  3/25 CRRT initiated  3/26 passed SBT and F/C. Extubated. No distress on recheck but very lethargic. Partially improved after naloxone  3/26 PM: reintubated for resp acidosis  3/29 Brain LOV:FIEP narrowed appearance of lateral ventricles, within normal variation though, considering fluid distended sella can be seen with chronic intracranial hypertension. Differential diagnosis includes empty sella. Abnormal bone marrow signal corresponding to CT abnormality which may reflect patient's myeloma, though is nonspecific.  3/30 AM: AFRVR with hypotension > phenylephrine initiated  3/30 Recurrent septic shock thought due to HAP. PE changed to VP + NE  3/31 Refractory septic shock and worsening lactic acidosis. Family apprised of grave situation. Continue full aggressive medical care but DNR if these efforts fail. Fentanyl infusion to ensure comfort initiated Oct 02, 2022 Remained unresponsive. Refractory shock despite max dose vasopressors. Ultimately asystole ensued and pt died peacefully   Cause of death: recurrent septic shock  Contributing factors: Multiple myeloma, MODS   Autopsy:  No   Smoking:  No  Merton Border, MD;  PCCM service; Mobile [phone]

## 2013-10-22 NOTE — Progress Notes (Signed)
Hypoglycemic Event  CBG: 69  Treatment: D50 IV 25 mL  Symptoms: None  Follow-up CBG: Time:0059 CBG Result:117  Possible Reasons for Event: Unknown  Comments/MD notified:NA    [name]  Remember to initiate Hypoglycemia Order Set & complete

## 2013-10-22 NOTE — Progress Notes (Signed)
PULMONARY / CRITICAL CARE MEDICINE   Name: [name] MRN: [id number] DOB: [birth year]    ADMISSION DATE:  09/11/2013 CONSULTATION DATE:  09/07/13  REFERRING MD :  Dr. Maryland Pink PRIMARY SERVICE: TRH-->PCCM  CHIEF COMPLAINT:  Acute Respiratory Failure  BRIEF PATIENT DESCRIPTION: 63 y/o F admitted with 3 wk hx of weakness, decreased appetite & difficulty swallowing.  Found to have positive strep antigen with concern for L retrocardiac infiltrate.  Progressed to hypercarbic respiratory failure, PCCM consulted.  SIGNIFICANT EVENTS / STUDIES:  3/16 Admit with 3 wk hx of weakness, decreased appetite & difficulty swallowing.  Found to have positive strep antigen with concern for L retrocardiac infiltrate.  3/17 Hypercarbic respiratory failure, intubated early am 3/17 ECHO:  mod LVH, nml systolic fxn, EF 91-79%, nml wall motion, grade II diastolic dysfunction  1/50  remains on vent, levophed & fentanyl gtt 3/20 afib with RVR overnight 3/23 failed SBT  3/24 failed SBT 3/24 renal US: Echogenic kidneys suggesting chronic medical renal disease. 3/24 Transferred to [facility] for CRRT 3/25 CRRT initiated 3/26 passed SBT and F/C. Extubated. No distress on recheck but very lethargic. Partially improved after naloxone 3/26 PM: reintubated for resp acidosis 3/29 Brain VWP:VXYI narrowed appearance of lateral ventricles, within normal variation though, considering fluid distended sella can be seen with chronic intracranial hypertension. Differential diagnosis includes empty sella. Abnormal bone marrow signal corresponding to CT abnormality which may reflect patient's myeloma, though is nonspecific. 3/30 AM: AFRVR with hypotension > phenylephrine initiated 3/30 Recurrent septic shock thought due to HAP. PE changed to VP + NE 3/31 Refractory septic shock and worsening lactic acidosis. Family apprised of grave situation. Continue full aggressive medical care but DNR if these efforts fail 4/01 Remains  unresponsive. CXR: tubes/lines in position, pneumonia likely with left pleural effusion.   LINES / TUBES: Port-A-Cath 02/27/10 ETT 3/17 >> 3/26, 3/26 >>  L IJ TLC 3/17 >>  R femoral HD cath 3/24 >>  L radial A-line 3/26 >>   CULTURES: BCx2 3/16>>>neg RVP 3/16>>>neg Sputum 3/16>>>nml flora UC 3/16 >> neg UA 3/16 >> 3-6 wbc, few bacteria.  Asymptomatic on admit. Strep AG 3/16 >> positive Resp 3/29 >> NOF Resp 3/30 >> NOF Pneumocystis DFA 3/30 >> NEG  ANTIBIOTICS: Azithro 3/16>>>3/17 Cefepime 3/16>>>3/17 Rocephin 3/16>> 3/25 Vanc 3/30 >>  Ceftaz 3/30 >>   SUBJECTIVE:   Refractory shock despite max dose vasopressors. Comatose  VITAL SIGNS: Pulse Rate:  [25-88] 63 (04/01 1604) Resp:  [16-31] 16 (04/01 1500) BP: (29-105)/(13-76) 62/35 mmHg (04/01 1100) SpO2:  [58 %-100 %] 100 % (04/01 0414) Arterial Line BP: (31-49)/(27-47) 49/47 mmHg (04/01 0800) FiO2 (%):  [50 %] 50 % (04/01 1604) Weight:  [89 kg (196 lb 3.4 oz)] 89 kg (196 lb 3.4 oz) (04/01 0447)  HEMODYNAMICS:    VENTILATOR SETTINGS: Vent Mode:  [-] PRVC FiO2 (%):  [50 %] 50 % Set Rate:  [16 bmp] 16 bmp Vt Set:  [440 mL] 440 mL PEEP:  [5 cmH20] 5 cmH20 Plateau Pressure:  [19 cmH20-33 cmH20] 19 cmH20  INTAKE / OUTPUT: Intake/Output     03/31 0701 - 04/01 0700 04/01 0701 - 04/02 0700   I.V. (mL/kg) 6807.5 (76.5) 3048.1 (34.2)   NG/GT 165 90   IV Piggyback 550 250   Total Intake(mL/kg) 7522.5 (84.5) 3388.1 (38.1)   Emesis/NG output 1150 100   Other 6451 3351   Stool 0 0   Total Output 7601 3451   Net -78.5 -62.9  PHYSICAL EXAMINATION: Gen: unresponsive HEENT: NCAT, ETT PULM: Rhonchi bilaterally CV: RRR AB: decreased BS, soft, nontender Ext: cool, some edema noted Neuro: unresponsive and not following commands   LABS: I have reviewed all of today's lab results. Relevant abnormalities are discussed in the A/P section   CXR:.Tubes/lines in good position; unchanged diffuse airway disease,  likely pneumonia based on 09/19/13 CXR. Left pleural effusion.    ASSESSMENT / PLAN:  PULMONARY A: Acute Respiratory Failure S/P failed extubation attempt 3/26 LLL PNA on admission Suspect pulmonary edema Suspected HAP P:   Would consider palliative care consult Cont full vent support - settings reviewed and/or adjusted Cont vent bundle Daily SBT if/when meets criteria  CARDIOVASCULAR A:  HLD CHF Shock, septic, recurrent and refractory AF/flutter with RVR, recurrent P:  Monitor rhythm and BP Cont vasopressors to maintain MAP > 65 mmHg  RENAL A:   Acute on Chronic Renal Insufficiency Lactic acidosis, recurrent Hx Hypercalcemia due to MM - inactive problem P:   d/c BMET's Monitor I/Os If CRRT tubing clots would not restart  Renal following   GASTROINTESTINAL A:   GERD Obesity Dysphagia P:   SUP: PPI per tube Cont TFs   HEMATOLOGIC A:   Multiple Myeloma, relapsed Thrombocytopenia Hx DVT - 2011 P:  DVT px: d/c sq heparin; continue SCDs d/c CBC's Would not transfuse given poor prognosis  INFECTIOUS A:   Pneumococcal PNA, fully treated Bacteriuria, asymptomatic Severe sepsis, recurrent Suspect HAP 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 Impaired ventilatory drive P:   Cont PRN sedative/analgesics  Code status: DNR   She is moribund but comfortable. Will not add anything to our current efforts but not escalate care from here   Merton Border, MD ; [facility] [phone].  After 5:30 PM or weekends, call [phone]

## 2013-10-22 NOTE — Progress Notes (Signed)
Chaplain requested to provide grief support to family. Listened empathically to the deceased's great nephew as he processed his grief. Prayed with him, the deceased's niece, and her sister, [name]. Provided emotional, spiritual, and grief support. [name], sister, is next of kin; she is uncertain of funeral home. Chaplain provided phone number for patient placement for her to call when family has made funeral home decisions. They were grateful for support. Please page for follow up.   Ethelene Browns [phone]

## 2013-10-22 NOTE — Progress Notes (Signed)
CRITICAL VALUE ALERT  Critical value received:  Ca 6.1  Date of notification:  10/19/2013  Time of notification:  0604  Critical value read back:yes  Nurse who received alert:  York Pellant  Lab value expected

## 2013-10-22 NOTE — Progress Notes (Signed)
Pt previously on Fentanyl drip (62mcg/ml); 125 ml of 250 ml bag wasted in sink; verified with Anneita Minor, RN, DD Lenor Coffin, RN/ Liborio Nixon Minor RN   [phone].  Lenor Coffin, RN

## 2013-10-22 NOTE — Progress Notes (Signed)
[facility] Progress Note   Subjective:  Pt with progressive shock Max pressors DNR but no treatments withdrawn - remains on CRRT Progressive lactic acidosis Bicarb drip at 150/hour Shock liver  Filed Vitals:   18-Oct-2013 0500 10-18-13 0600 10/18/2013 0700 2013-10-18 0828  BP: 88/63 74/19 105/70 74/32  Pulse:      Temp:      TempSrc:      Resp: $Remo'19 19 17   'CqxZL$ Height:      Weight:      SpO2:       Exam: On vent Unresponsive Coarse breath sounds Abdomen quiet. Non distend Mottling of LE's less prevalent than yesterday No edema  UA 1.023, 5.5, 0-2wbc, 3-6rbc, few bact UNa 30, UCr 65  FeNa 1.1% Renal US - 11 cm bilat, no hydro, ^echo ECHO mod LVH  EF 55%  DD2. RV mildly dilated w nl function CXR 3/30 IMPRESSION:  1. Increased airspace disease on the right, likely pneumonia based  on the appearance yesterday.  2. Unchanged left-sided opacity which could represent pleural fluid,  atelectasis, and/or pneumonia.  3. Pulmonary venous congestion   Recent Labs Lab 09/20/13 1600 09/21/13 0420 09/21/13 1503 Oct 18, 2013 0445  NA 130* 129* 128* 126*  K 4.6 4.6 5.5* 5.1  CL 96 92* 90* 88*  CO2 17* 14* 12* 13*  GLUCOSE 239* 172* 109* 98  BUN 42* 36* 27* 21  CREATININE 1.52* 1.26* 1.06 0.83  CALCIUM 6.4* 7.0* 6.2* 6.1*  PHOS 3.8 3.9 4.7*  --     Recent Labs Lab 09/21/13 0420 09/21/13 1503 2013/10/18 0445  AST  --   --  >10000*  ALT  --   --  5001*  ALKPHOS  --   --  198*  BILITOT  --   --  1.3*  PROT  --   --  6.3  ALBUMIN 1.3* 1.5* 1.4*    Recent Labs Lab 09/20/13 0400 09/21/13 0940 2013/10/18 0445  WBC 3.9* 4.4 6.4  HGB 11.2* 10.5* 10.1*  HCT 31.5* 31.3* 29.9*  MCV 82.9 86.5 85.9  PLT 76* 84* 57*   MEDICATIONS . antiseptic oral rinse  15 mL Mouth Rinse QID  . cefTAZidime (FORTAZ)  IV  2 g Intravenous Q12H  . chlorhexidine  15 mL Mouth Rinse BID  . famotidine  20 mg Per Tube Daily  . heparin subcutaneous  5,000 Units Subcutaneous 3 times per day   . hydrocortisone sod succinate (SOLU-CORTEF) inj  100 mg Intravenous Q8H  . vancomycin  1,000 mg Intravenous Q24H   . sodium chloride 10 mL/hr at 09/20/13 2152  . amiodarone (NEXTERONE PREMIX) 360 mg/200 mL dextrose Stopped (09/21/13 0943)  . fentaNYL infusion INTRAVENOUS 50 mcg/hr (09/21/13 1538)  . norepinephrine (LEVOPHED) Adult infusion 50 mcg/min (Oct 18, 2013 0412)  . phenylephrine (NEO-SYNEPHRINE) Adult infusion 200 mcg/min (2013/10/18 0640)  . dialysis replacement fluid (prismasate) 300 mL/hr at 09/21/13 1929  . dialysis replacement fluid (prismasate) 200 mL/hr at 09/21/13 1409  . dialysate (PRISMASATE) 1,500 mL/hr at 2013/10/18 0542  .  sodium bicarbonate 150 mEq in sterile water 1000 mL infusion 150 mEq (10-18-13 0538)  . vasopressin (PITRESSIN) infusion - *FOR SHOCK* 0.03 Units/min (09/21/13 1749)   albuterol, fentaNYL, heparin, heparin, lidocaine-prilocaine, metoprolol, sodium chloride   Assessment:  63 y.o. year-old with hx of obesity, HTN, DM and multiple myeloma dx'd 2008; on treatment break, last cycle Jan 2015 (Kyprolis). Presented on 3/16 w weakness, anorexia, swallowing difficulty and LLL infiltrate. Progressed to resp failure, intubated on 3/17.  +  strep antigen. ECHO normal. Shock with development of AKI. Required initiation of CRRT 3/25.   Acute renal failure- ATN d/t shock, anuric, on CRRT simply keeping even d/t BP issues; shock is progressive, CO2 dropping/gap widening; lactic acidosis; on  bicarb drip at 150/hour + CRRT pending further decision by primary service (CCM) in discussion with family as to how hard to keep pushing. Increase bicarb drip to 200 cc/hour Vol overload severe / pulm edema: CXR worsened 3/30 PNA / VDRF- vanco/fortaz Multiple myeloma  Shock with shock liver - on max pressors now Anemia no transfusion required as of yet DM on insulin SSI  AF with RVR amio  Prognosis very poor as pt continues to deteriorate with full measure of support   Jamal Maes, MD [place] Pager 10/10/2013, 8:54 AM

## 2013-10-22 NOTE — Procedures (Signed)
Extubation Procedure Note  Patient Details:   Name: [name] DOB: Feb 01, 1951 MRN: [id number]   Pt deceased, ET tube removed with no complications.    Evaluation  O2 sats: pt deceased Complications: No apparent complications Patient did not tolerate procedure well. Bilateral Breath Sounds: Rhonchi Suctioning: Airway No  Jetty Peeks 10/19/2013, 5:09 PM

## 2013-10-22 NOTE — Progress Notes (Signed)
Pt asystole in EKG leads x 2; no apical heart tones noted; no spontaneous respirations; pupils fixed and dilated; findings verified with Livia Snellen, RN; Pt is NCB; Pt pronounced at 1639; sister Thora Lance notified; Dr Alva Garnet notified;   Lenor Coffin, RN

## 2013-10-22 NOTE — Progress Notes (Deleted)
[facility] Progress Note Patient Name: [name] DOB: 09-Aug-1950 MRN: [id number]  Date of Service  09/26/2013   HPI/Events of Note   Poor prognosis  eICU Interventions  Dc aline   Intervention Category Minor Interventions: Other:  Amaury Kuzel 09/23/2013, 12:15 AM

## 2013-10-22 NOTE — Progress Notes (Signed)
PULMONARY / CRITICAL CARE MEDICINE   Name: [name] MRN: [id number] DOB: Sep 16, 1950    ADMISSION DATE:  09/12/2013 CONSULTATION DATE:  09/07/13  REFERRING MD :  Dr. Rito Ehrlich PRIMARY SERVICE: [facility]-->PCCM  CHIEF COMPLAINT:  Acute Respiratory Failure  BRIEF PATIENT DESCRIPTION: 63 y/o F admitted with 3 wk hx of weakness, decreased appetite & difficulty swallowing.  Found to have positive strep antigen with concern for L retrocardiac infiltrate.  Progressed to hypercarbic respiratory failure, PCCM consulted.  SIGNIFICANT EVENTS / STUDIES:  3/16 Admit with 3 wk hx of weakness, decreased appetite & difficulty swallowing.  Found to have positive strep antigen with concern for L retrocardiac infiltrate.  3/17 Hypercarbic respiratory failure, intubated early am 3/17 ECHO:  mod LVH, nml systolic fxn, EF 47-13%, nml wall motion, grade II diastolic dysfunction  3/18  remains on vent, levophed & fentanyl gtt 3/20 afib with RVR overnight 3/23 failed SBT  3/24 failed SBT 3/24 renal US: Echogenic kidneys suggesting chronic medical renal disease. 3/24 Transferred to [facility] for CRRT 3/25 CRRT initiated 3/26 passed SBT and F/C. Extubated. No distress on recheck but very lethargic. Partially improved after naloxone 3/26 PM: reintubated for resp acidosis 3/29 Brain BPM:[facility] narrowed appearance of lateral ventricles, within normal variation though, considering fluid distended sella can be seen with chronic intracranial hypertension. Differential diagnosis includes empty sella. Abnormal bone marrow signal corresponding to CT abnormality which may reflect patient's myeloma, though is nonspecific. 3/30 AM: AFRVR with hypotension > phenylephrine initiated 3/30 Recurrent septic shock thought due to HAP. PE changed to VP + NE 3/31 Refractory septic shock and worsening lactic acidosis. Family apprised of grave situation. Continue full aggressive medical care but DNR if these efforts fail 4/01 Remains  unresponsive. CXR: tubes/lines in position, pneumonia likely with left pleural effusion.   LINES / TUBES: Port-A-Cath 02/27/10 ETT 3/17 >> 3/26, 3/26 >>  L IJ TLC 3/17 >>  R femoral HD cath 3/24 >>  L radial A-line 3/26 >>   CULTURES: BCx2 3/16>>>neg RVP 3/16>>>neg Sputum 3/16>>>nml flora UC 3/16 >> neg UA 3/16 >> 3-6 wbc, few bacteria.  Asymptomatic on admit. Strep AG 3/16 >> positive Resp 3/29 >> NOF Resp 3/30 >> NOF Pneumocystis DFA 3/30 >> NEG  ANTIBIOTICS: Azithro 3/16>>>3/17 Cefepime 3/16>>>3/17 Rocephin 3/16>> 3/25 Vanc 3/30 >>  Ceftaz 3/30 >>   SUBJECTIVE:   Refractory shock despite max dose vasopressors. Comatose  VITAL SIGNS: Temp:  [97.5 F (36.4 C)] 97.5 F (36.4 C) (03/31 1529) Pulse Rate:  [25-94] 79 (04/01 0414) Resp:  [16-36] 16 (04/01 1300) BP: (29-105)/(13-76) 62/35 mmHg (04/01 1100) SpO2:  [58 %-100 %] 100 % (04/01 0414) Arterial Line BP: (31-49)/(27-47) 49/47 mmHg (04/01 0800) FiO2 (%):  [50 %] 50 % (04/01 1223) Weight:  [196 lb 3.4 oz (89 kg)] 196 lb 3.4 oz (89 kg) (04/01 0447)  HEMODYNAMICS:    VENTILATOR SETTINGS: Vent Mode:  [-] PRVC FiO2 (%):  [50 %] 50 % Set Rate:  [16 bmp] 16 bmp Vt Set:  [440 mL] 440 mL PEEP:  [5 cmH20] 5 cmH20 Plateau Pressure:  [27 cmH20-33 cmH20] 33 cmH20  INTAKE / OUTPUT: Intake/Output     03/31 0701 - 04/01 0700 04/01 0701 - 04/02 0700   I.V. (mL/kg) 6807.5 (76.5) 2010.4 (22.6)   NG/GT 165 60   IV Piggyback 550 250   Total Intake(mL/kg) 7522.5 (84.5) 2320.4 (26.1)   Emesis/NG output 1150 100   Other 6451 2332   Stool 0 0   Total Output  7601 2432   Net -78.5 -111.6          PHYSICAL EXAMINATION: Gen: unresponsive HEENT: NCAT, ETT PULM: Rhonchi bilaterally CV: RRR AB: decreased BS, soft, nontender Ext: cool, some edema noted Neuro: unresponsive and not following commands   LABS: I have reviewed all of today's lab results. Relevant abnormalities are discussed in the A/P  section   CXR:.Tubes/lines in good position; unchanged diffuse airway disease, likely pneumonia based on 09/19/13 CXR. Left pleural effusion.    ASSESSMENT / PLAN:  PULMONARY A: Acute Respiratory Failure S/P failed extubation attempt 3/26 LLL PNA on admission Suspect pulmonary edema Suspected HAP P:   Would consider palliative care consult Cont full vent support - settings reviewed and/or adjusted Cont vent bundle Daily SBT if/when meets criteria  CARDIOVASCULAR A:  HLD CHF Shock, septic, recurrent and refractory AF/flutter with RVR, recurrent P:  Monitor rhythm and BP Cont vasopressors to maintain MAP > 65 mmHg  RENAL A:   Acute on Chronic Renal Insufficiency Lactic acidosis, recurrent Hx Hypercalcemia due to MM - inactive problem P:   d/c BMET's Monitor I/Os If CRRT tubing clots would not restart  Renal following   GASTROINTESTINAL A:   GERD Obesity Dysphagia P:   SUP: PPI per tube Cont TFs   HEMATOLOGIC A:   Multiple Myeloma, relapsed Thrombocytopenia Hx DVT - 2011 P:  DVT px: d/c sq heparin; continue SCDs d/c CBC's Would not transfuse given poor prognosis  INFECTIOUS A:   Pneumococcal PNA, fully treated Bacteriuria, asymptomatic Severe sepsis, recurrent Suspect HAP 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 Impaired ventilatory drive P:   Cont PRN sedative/analgesics  Code status: DNR

## 2013-10-22 DEATH — deceased

## 2014-02-15 ENCOUNTER — Other Ambulatory Visit: Payer: Self-pay | Admitting: *Deleted

## 2014-02-15 ENCOUNTER — Encounter: Payer: Self-pay | Admitting: *Deleted

## 2014-08-11 IMAGING — CR DG CHEST 1V PORT
1 series · 1 of 1 positions shown · non-contrast
Comparison: Prior radiograph from 09/08/2013

CLINICAL DATA: Evaluate endotracheal tube

EXAM:
PORTABLE CHEST - 1 VIEW

[AP]
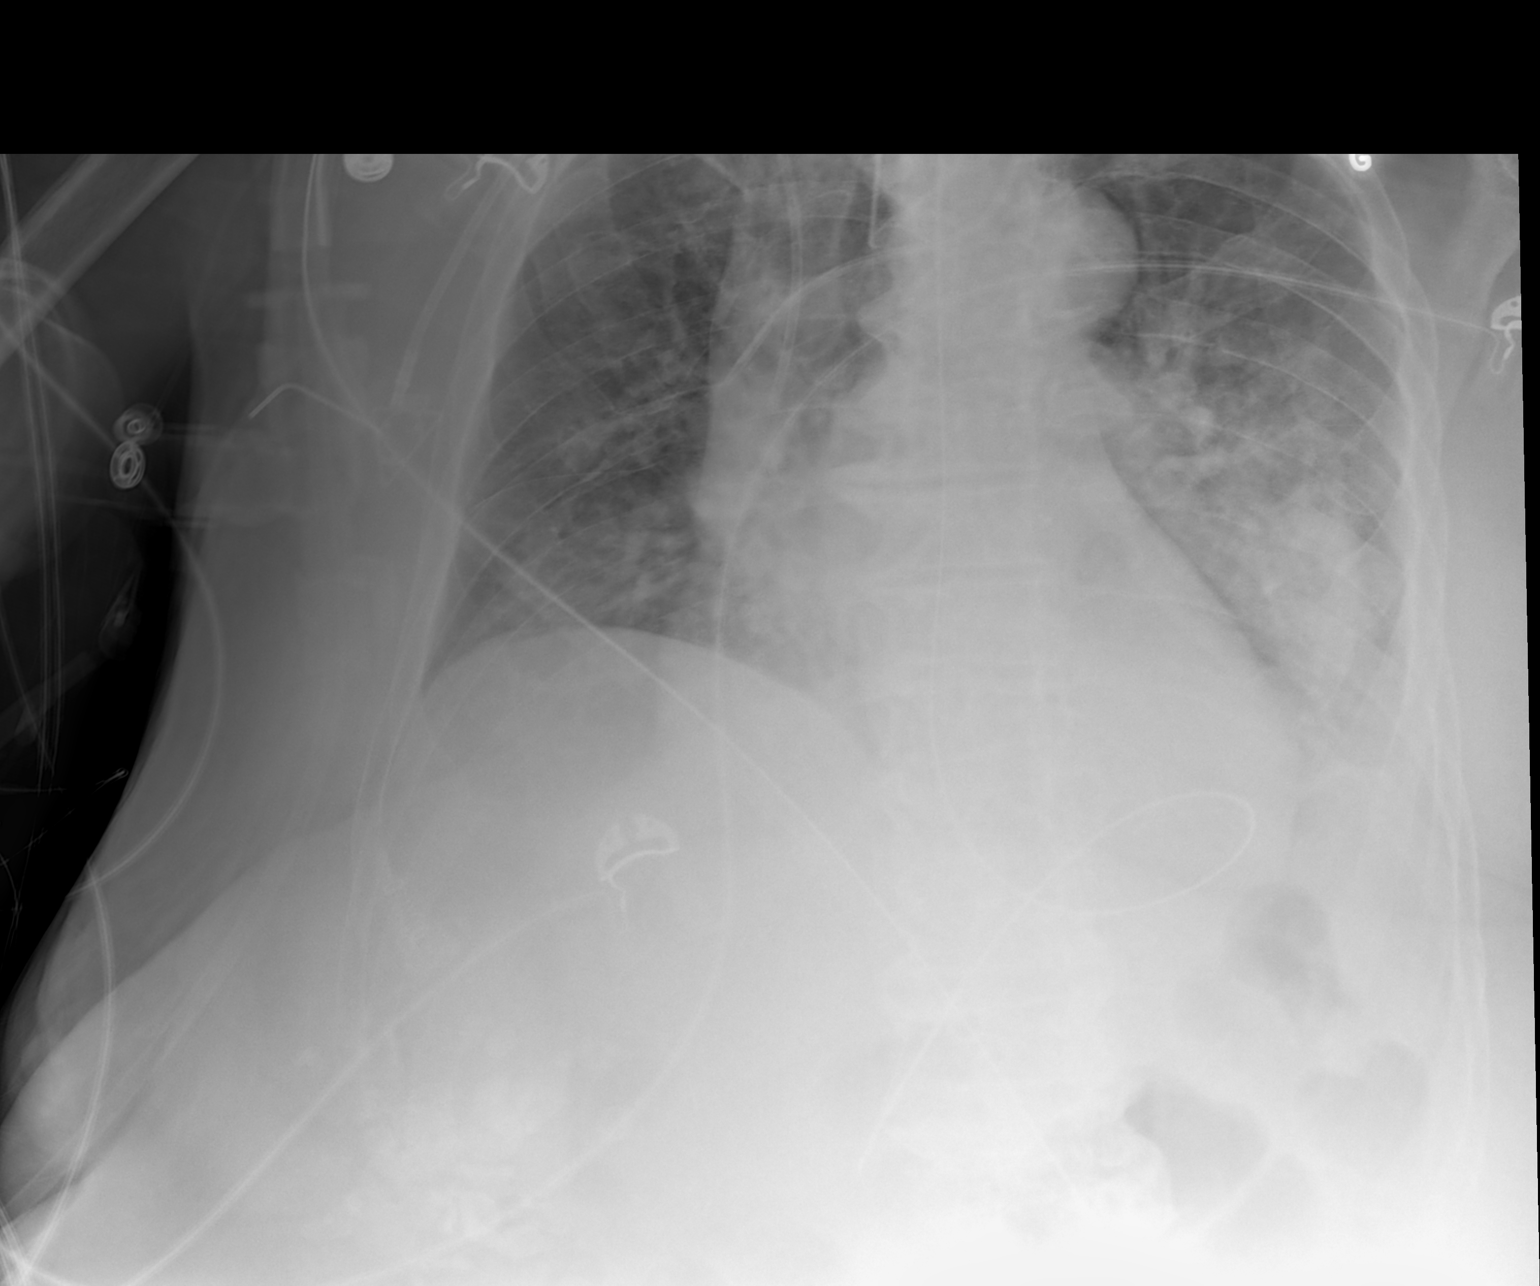

[1 of 1 positions shown; findings below may reference images not displayed]

FINDINGS: The patient is rotated to the right. Tip of the endotracheal tube is
positioned 3 cm above the carina. Right-sided Port-A-Cath is stable.
Left IJ approach central venous catheter tip is in stable position
overlying the mid SVC. Cardiac and mediastinal silhouettes are
unchanged.

Current examination has been performed with a slightly more shallow
degree of lung inflation. Diffuse pulmonary vascular congestion
seen. More confluent left perihilar patchy opacity is worsened as
compared to the prior study, which may reflect worsening pulmonary
edema. Left pleural effusion suspected. No pneumothorax.

No acute osseous abnormality.
IMPRESSION: 1. Tip of endotracheal tube 3 cm above the carina. Otherwise stable
support apparatus.
2. Diffuse pulmonary vascular congestion with increase left
perihilar patchy opacity, likely related to edema. Superimposed
pneumonia not entirely excluded.
3. Probable left pleural effusion.

## 2014-08-24 IMAGING — CR DG CHEST 1V PORT
1 series · 1 of 1 positions shown · non-contrast
Comparison: Chest x-ray from 2 days prior

CLINICAL DATA: Respiratory failure and shortness of breath

EXAM:
PORTABLE CHEST - 1 VIEW

[AP]
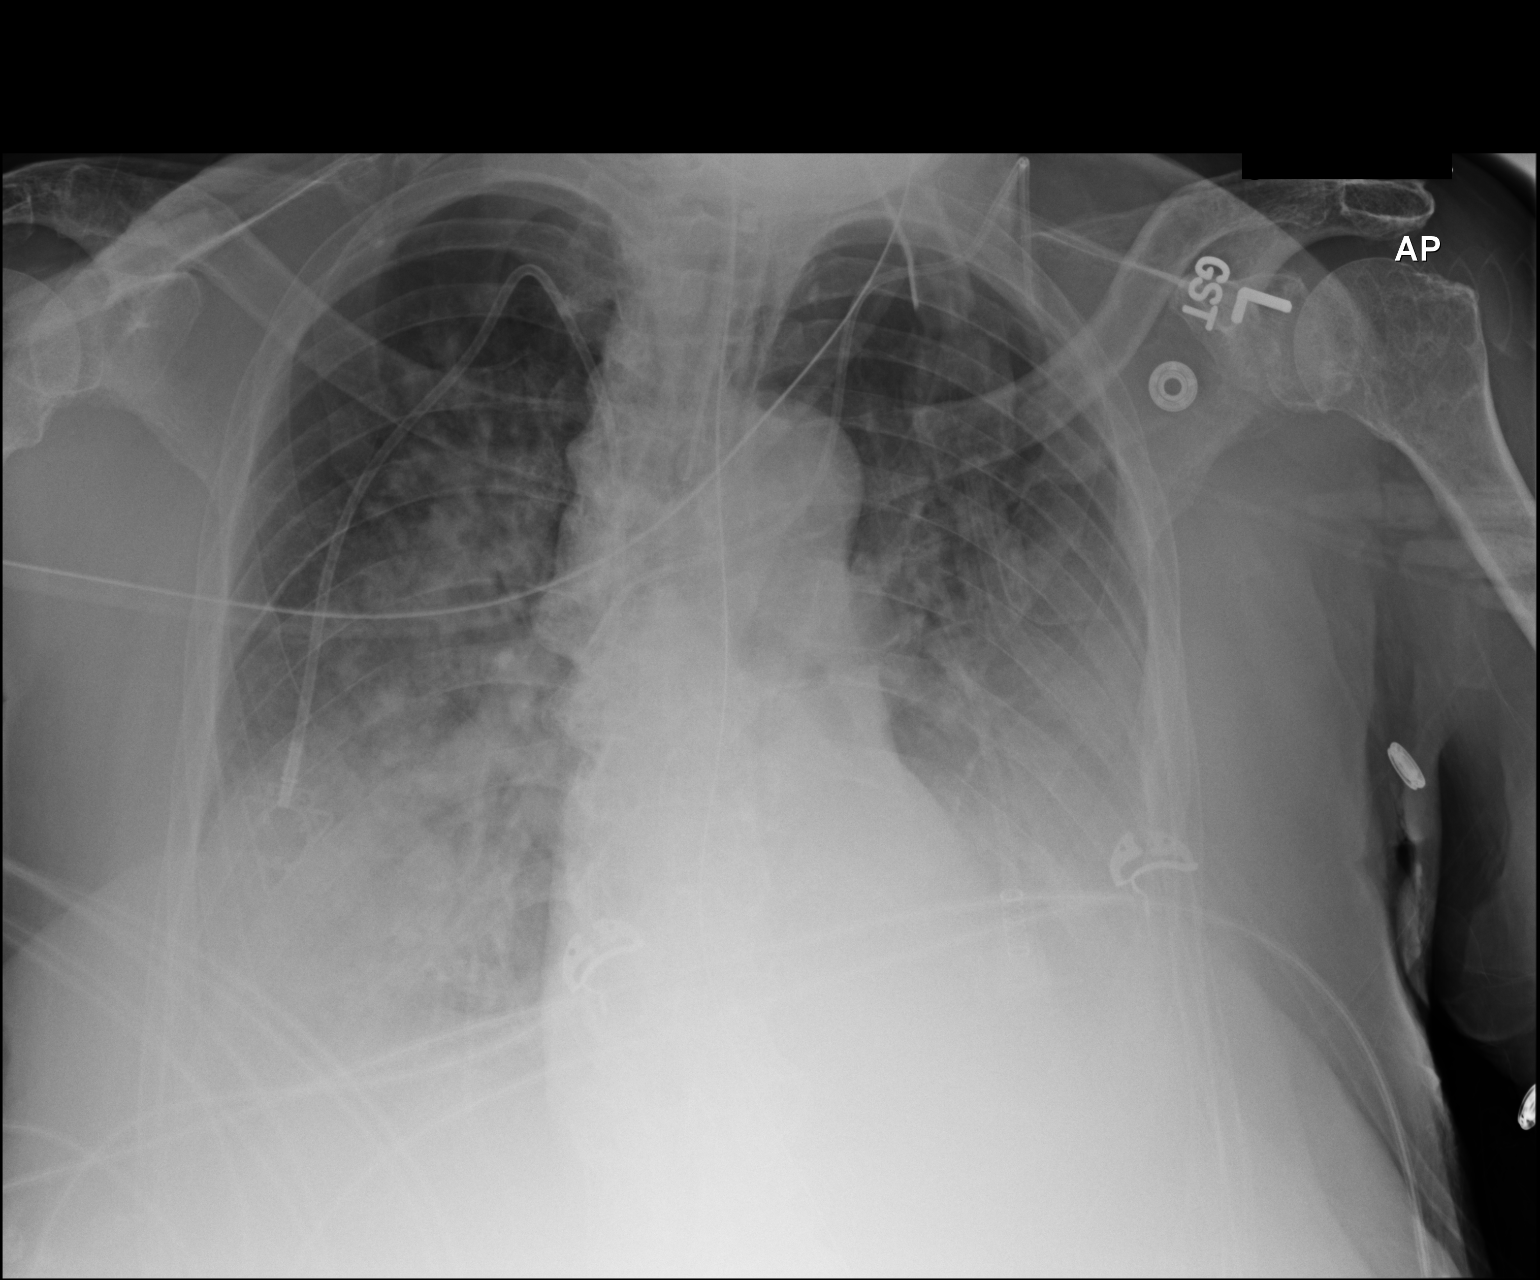

[1 of 1 positions shown; findings below may reference images not displayed]

FINDINGS: Endotracheal tube ends in the lower thoracic trachea, 3 cm above the
carina. Bilateral internal jugular catheters are in good position,
at tips at the SVC. Nasogastric tube continues beyond the GE
junction.

No change in the chest. There is hazy basilar opacities consistent
with pleural fluid. There is also bilateral airspace disease, with
bilateral air bronchograms. No evidence of pneumothorax. Stable
heart size.
IMPRESSION: 1. Tubes and lines remain in good position.
2. Unchanged diffuse aeration disturbance, likely pneumonia based on
09/19/2013 chest x-ray.
3. Small or moderate left pleural effusion.
